# Patient Record
Sex: Female | Born: 1935 | Race: White | Hispanic: No | Marital: Married | Smoking: Never smoker
Health system: Southern US, Community
[De-identification: ages and names within clinical notes are randomized; demographics above are authoritative.]

## PROBLEM LIST (undated history)

## (undated) DIAGNOSIS — Z952 Presence of prosthetic heart valve: Secondary | ICD-10-CM

## (undated) DIAGNOSIS — M216X2 Other acquired deformities of left foot: Secondary | ICD-10-CM

## (undated) DIAGNOSIS — K219 Gastro-esophageal reflux disease without esophagitis: Secondary | ICD-10-CM

## (undated) DIAGNOSIS — E785 Hyperlipidemia, unspecified: Secondary | ICD-10-CM

## (undated) DIAGNOSIS — K8689 Other specified diseases of pancreas: Secondary | ICD-10-CM

## (undated) DIAGNOSIS — M1990 Unspecified osteoarthritis, unspecified site: Secondary | ICD-10-CM

## (undated) DIAGNOSIS — I5033 Acute on chronic diastolic (congestive) heart failure: Secondary | ICD-10-CM

## (undated) DIAGNOSIS — M7752 Other enthesopathy of left foot: Secondary | ICD-10-CM

## (undated) DIAGNOSIS — I10 Essential (primary) hypertension: Secondary | ICD-10-CM

## (undated) DIAGNOSIS — G459 Transient cerebral ischemic attack, unspecified: Secondary | ICD-10-CM

## (undated) DIAGNOSIS — Q210 Ventricular septal defect: Secondary | ICD-10-CM

## (undated) DIAGNOSIS — N183 Chronic kidney disease, stage 3 (moderate): Secondary | ICD-10-CM

## (undated) DIAGNOSIS — I5032 Chronic diastolic (congestive) heart failure: Secondary | ICD-10-CM

## (undated) DIAGNOSIS — I350 Nonrheumatic aortic (valve) stenosis: Secondary | ICD-10-CM

## (undated) HISTORY — PX: TONSILLECTOMY: SUR1361

## (undated) HISTORY — DX: Acute on chronic diastolic (congestive) heart failure: I50.33

## (undated) HISTORY — PX: APPENDECTOMY: SHX54

## (undated) HISTORY — DX: Other enthesopathy of left foot and ankle: M77.52

## (undated) HISTORY — DX: Hyperlipidemia, unspecified: E78.5

## (undated) HISTORY — PX: ABDOMINAL HYSTERECTOMY: SHX81

## (undated) HISTORY — DX: Other specified diseases of pancreas: K86.89

## (undated) HISTORY — DX: Other acquired deformities of left foot: M21.6X2

## (undated) HISTORY — DX: Ventricular septal defect: Q21.0

## (undated) HISTORY — PX: WISDOM TOOTH EXTRACTION: SHX21

## (undated) HISTORY — DX: Chronic kidney disease, stage 3 (moderate): N18.3

## (undated) HISTORY — DX: Essential (primary) hypertension: I10

## (undated) HISTORY — PX: HERNIA REPAIR: SHX51

---

## 2011-10-09 DIAGNOSIS — Z1231 Encounter for screening mammogram for malignant neoplasm of breast: Secondary | ICD-10-CM | POA: Diagnosis not present

## 2011-10-24 DIAGNOSIS — L723 Sebaceous cyst: Secondary | ICD-10-CM | POA: Diagnosis not present

## 2011-10-24 DIAGNOSIS — L570 Actinic keratosis: Secondary | ICD-10-CM | POA: Diagnosis not present

## 2011-10-24 DIAGNOSIS — L821 Other seborrheic keratosis: Secondary | ICD-10-CM | POA: Diagnosis not present

## 2011-11-26 DIAGNOSIS — J069 Acute upper respiratory infection, unspecified: Secondary | ICD-10-CM | POA: Diagnosis not present

## 2011-11-26 DIAGNOSIS — I10 Essential (primary) hypertension: Secondary | ICD-10-CM | POA: Diagnosis not present

## 2011-11-26 DIAGNOSIS — E7800 Pure hypercholesterolemia, unspecified: Secondary | ICD-10-CM | POA: Diagnosis not present

## 2011-12-13 DIAGNOSIS — H524 Presbyopia: Secondary | ICD-10-CM | POA: Diagnosis not present

## 2011-12-13 DIAGNOSIS — H538 Other visual disturbances: Secondary | ICD-10-CM | POA: Diagnosis not present

## 2011-12-27 DIAGNOSIS — I10 Essential (primary) hypertension: Secondary | ICD-10-CM | POA: Diagnosis not present

## 2011-12-27 DIAGNOSIS — E7800 Pure hypercholesterolemia, unspecified: Secondary | ICD-10-CM | POA: Diagnosis not present

## 2012-05-20 DIAGNOSIS — L219 Seborrheic dermatitis, unspecified: Secondary | ICD-10-CM | POA: Diagnosis not present

## 2012-05-20 DIAGNOSIS — L570 Actinic keratosis: Secondary | ICD-10-CM | POA: Diagnosis not present

## 2012-06-16 DIAGNOSIS — H2510 Age-related nuclear cataract, unspecified eye: Secondary | ICD-10-CM | POA: Diagnosis not present

## 2012-06-27 DIAGNOSIS — Z23 Encounter for immunization: Secondary | ICD-10-CM | POA: Diagnosis not present

## 2012-06-30 DIAGNOSIS — R109 Unspecified abdominal pain: Secondary | ICD-10-CM | POA: Diagnosis not present

## 2012-06-30 DIAGNOSIS — M545 Low back pain: Secondary | ICD-10-CM | POA: Diagnosis not present

## 2012-06-30 DIAGNOSIS — Z79899 Other long term (current) drug therapy: Secondary | ICD-10-CM | POA: Diagnosis not present

## 2012-06-30 DIAGNOSIS — E559 Vitamin D deficiency, unspecified: Secondary | ICD-10-CM | POA: Diagnosis not present

## 2012-09-30 DIAGNOSIS — I359 Nonrheumatic aortic valve disorder, unspecified: Secondary | ICD-10-CM | POA: Diagnosis not present

## 2012-09-30 DIAGNOSIS — J00 Acute nasopharyngitis [common cold]: Secondary | ICD-10-CM | POA: Diagnosis not present

## 2012-10-09 DIAGNOSIS — Z1231 Encounter for screening mammogram for malignant neoplasm of breast: Secondary | ICD-10-CM | POA: Diagnosis not present

## 2012-10-15 DIAGNOSIS — R011 Cardiac murmur, unspecified: Secondary | ICD-10-CM | POA: Diagnosis not present

## 2012-10-15 DIAGNOSIS — I359 Nonrheumatic aortic valve disorder, unspecified: Secondary | ICD-10-CM | POA: Diagnosis not present

## 2012-11-26 DIAGNOSIS — E785 Hyperlipidemia, unspecified: Secondary | ICD-10-CM | POA: Diagnosis not present

## 2012-11-29 DIAGNOSIS — L0291 Cutaneous abscess, unspecified: Secondary | ICD-10-CM | POA: Diagnosis not present

## 2012-11-29 DIAGNOSIS — IMO0002 Reserved for concepts with insufficient information to code with codable children: Secondary | ICD-10-CM | POA: Diagnosis not present

## 2013-01-01 DIAGNOSIS — S91009A Unspecified open wound, unspecified ankle, initial encounter: Secondary | ICD-10-CM | POA: Diagnosis not present

## 2013-02-24 DIAGNOSIS — E2839 Other primary ovarian failure: Secondary | ICD-10-CM | POA: Diagnosis not present

## 2013-02-24 DIAGNOSIS — I10 Essential (primary) hypertension: Secondary | ICD-10-CM | POA: Diagnosis not present

## 2013-02-24 DIAGNOSIS — M949 Disorder of cartilage, unspecified: Secondary | ICD-10-CM | POA: Diagnosis not present

## 2013-02-24 DIAGNOSIS — Z780 Asymptomatic menopausal state: Secondary | ICD-10-CM | POA: Diagnosis not present

## 2013-02-24 DIAGNOSIS — Z79899 Other long term (current) drug therapy: Secondary | ICD-10-CM | POA: Diagnosis not present

## 2013-04-29 DIAGNOSIS — L821 Other seborrheic keratosis: Secondary | ICD-10-CM | POA: Diagnosis not present

## 2013-04-29 DIAGNOSIS — L820 Inflamed seborrheic keratosis: Secondary | ICD-10-CM | POA: Diagnosis not present

## 2013-04-29 DIAGNOSIS — L219 Seborrheic dermatitis, unspecified: Secondary | ICD-10-CM | POA: Diagnosis not present

## 2013-05-19 DIAGNOSIS — R0609 Other forms of dyspnea: Secondary | ICD-10-CM | POA: Diagnosis not present

## 2013-05-19 DIAGNOSIS — I359 Nonrheumatic aortic valve disorder, unspecified: Secondary | ICD-10-CM | POA: Diagnosis not present

## 2013-05-19 DIAGNOSIS — I10 Essential (primary) hypertension: Secondary | ICD-10-CM | POA: Diagnosis not present

## 2013-05-19 DIAGNOSIS — E785 Hyperlipidemia, unspecified: Secondary | ICD-10-CM | POA: Diagnosis not present

## 2013-06-11 DIAGNOSIS — Z23 Encounter for immunization: Secondary | ICD-10-CM | POA: Diagnosis not present

## 2013-06-17 DIAGNOSIS — H2510 Age-related nuclear cataract, unspecified eye: Secondary | ICD-10-CM | POA: Diagnosis not present

## 2013-06-26 DIAGNOSIS — W108XXA Fall (on) (from) other stairs and steps, initial encounter: Secondary | ICD-10-CM | POA: Diagnosis not present

## 2013-06-26 DIAGNOSIS — S41109A Unspecified open wound of unspecified upper arm, initial encounter: Secondary | ICD-10-CM | POA: Diagnosis not present

## 2013-06-26 DIAGNOSIS — G319 Degenerative disease of nervous system, unspecified: Secondary | ICD-10-CM | POA: Diagnosis not present

## 2013-06-30 DIAGNOSIS — IMO0002 Reserved for concepts with insufficient information to code with codable children: Secondary | ICD-10-CM | POA: Diagnosis not present

## 2013-06-30 DIAGNOSIS — I10 Essential (primary) hypertension: Secondary | ICD-10-CM | POA: Diagnosis not present

## 2013-06-30 DIAGNOSIS — W010XXA Fall on same level from slipping, tripping and stumbling without subsequent striking against object, initial encounter: Secondary | ICD-10-CM | POA: Diagnosis not present

## 2013-06-30 DIAGNOSIS — S51809A Unspecified open wound of unspecified forearm, initial encounter: Secondary | ICD-10-CM | POA: Diagnosis not present

## 2013-06-30 DIAGNOSIS — M25559 Pain in unspecified hip: Secondary | ICD-10-CM | POA: Diagnosis not present

## 2013-07-14 DIAGNOSIS — Z006 Encounter for examination for normal comparison and control in clinical research program: Secondary | ICD-10-CM | POA: Diagnosis not present

## 2013-07-14 DIAGNOSIS — Z0000 Encounter for general adult medical examination without abnormal findings: Secondary | ICD-10-CM | POA: Diagnosis not present

## 2013-07-14 DIAGNOSIS — S41109A Unspecified open wound of unspecified upper arm, initial encounter: Secondary | ICD-10-CM | POA: Diagnosis not present

## 2013-07-14 DIAGNOSIS — I10 Essential (primary) hypertension: Secondary | ICD-10-CM | POA: Diagnosis not present

## 2013-08-31 DIAGNOSIS — I359 Nonrheumatic aortic valve disorder, unspecified: Secondary | ICD-10-CM | POA: Diagnosis not present

## 2013-08-31 DIAGNOSIS — Z79899 Other long term (current) drug therapy: Secondary | ICD-10-CM | POA: Diagnosis not present

## 2013-08-31 DIAGNOSIS — I10 Essential (primary) hypertension: Secondary | ICD-10-CM | POA: Diagnosis not present

## 2013-08-31 DIAGNOSIS — S0990XA Unspecified injury of head, initial encounter: Secondary | ICD-10-CM | POA: Diagnosis not present

## 2013-08-31 DIAGNOSIS — R42 Dizziness and giddiness: Secondary | ICD-10-CM | POA: Diagnosis not present

## 2013-08-31 DIAGNOSIS — E7800 Pure hypercholesterolemia, unspecified: Secondary | ICD-10-CM | POA: Diagnosis not present

## 2013-08-31 DIAGNOSIS — R0602 Shortness of breath: Secondary | ICD-10-CM | POA: Diagnosis not present

## 2013-08-31 DIAGNOSIS — R5381 Other malaise: Secondary | ICD-10-CM | POA: Diagnosis not present

## 2013-09-01 DIAGNOSIS — I359 Nonrheumatic aortic valve disorder, unspecified: Secondary | ICD-10-CM | POA: Diagnosis not present

## 2013-09-01 DIAGNOSIS — I10 Essential (primary) hypertension: Secondary | ICD-10-CM | POA: Diagnosis not present

## 2013-09-01 DIAGNOSIS — I70209 Unspecified atherosclerosis of native arteries of extremities, unspecified extremity: Secondary | ICD-10-CM | POA: Diagnosis not present

## 2013-09-30 DIAGNOSIS — I10 Essential (primary) hypertension: Secondary | ICD-10-CM | POA: Diagnosis not present

## 2013-10-27 DIAGNOSIS — I359 Nonrheumatic aortic valve disorder, unspecified: Secondary | ICD-10-CM | POA: Diagnosis not present

## 2013-10-27 DIAGNOSIS — I10 Essential (primary) hypertension: Secondary | ICD-10-CM | POA: Diagnosis not present

## 2013-10-29 DIAGNOSIS — Z1231 Encounter for screening mammogram for malignant neoplasm of breast: Secondary | ICD-10-CM | POA: Diagnosis not present

## 2013-11-09 DIAGNOSIS — I10 Essential (primary) hypertension: Secondary | ICD-10-CM | POA: Diagnosis not present

## 2013-11-30 DIAGNOSIS — R0609 Other forms of dyspnea: Secondary | ICD-10-CM | POA: Diagnosis not present

## 2013-11-30 DIAGNOSIS — I10 Essential (primary) hypertension: Secondary | ICD-10-CM | POA: Diagnosis not present

## 2013-11-30 DIAGNOSIS — I359 Nonrheumatic aortic valve disorder, unspecified: Secondary | ICD-10-CM | POA: Diagnosis not present

## 2013-11-30 DIAGNOSIS — R0989 Other specified symptoms and signs involving the circulatory and respiratory systems: Secondary | ICD-10-CM | POA: Diagnosis not present

## 2013-11-30 DIAGNOSIS — R5381 Other malaise: Secondary | ICD-10-CM | POA: Diagnosis not present

## 2013-12-08 DIAGNOSIS — I359 Nonrheumatic aortic valve disorder, unspecified: Secondary | ICD-10-CM | POA: Diagnosis not present

## 2013-12-08 DIAGNOSIS — R011 Cardiac murmur, unspecified: Secondary | ICD-10-CM | POA: Diagnosis not present

## 2013-12-15 DIAGNOSIS — R0609 Other forms of dyspnea: Secondary | ICD-10-CM | POA: Diagnosis not present

## 2013-12-15 DIAGNOSIS — R0989 Other specified symptoms and signs involving the circulatory and respiratory systems: Secondary | ICD-10-CM | POA: Diagnosis not present

## 2013-12-15 DIAGNOSIS — H2589 Other age-related cataract: Secondary | ICD-10-CM | POA: Diagnosis not present

## 2013-12-28 DIAGNOSIS — H6980 Other specified disorders of Eustachian tube, unspecified ear: Secondary | ICD-10-CM | POA: Diagnosis not present

## 2014-01-19 DIAGNOSIS — E559 Vitamin D deficiency, unspecified: Secondary | ICD-10-CM | POA: Diagnosis not present

## 2014-01-19 DIAGNOSIS — I10 Essential (primary) hypertension: Secondary | ICD-10-CM | POA: Diagnosis not present

## 2014-01-19 DIAGNOSIS — E2839 Other primary ovarian failure: Secondary | ICD-10-CM | POA: Diagnosis not present

## 2014-01-21 DIAGNOSIS — I10 Essential (primary) hypertension: Secondary | ICD-10-CM | POA: Diagnosis not present

## 2014-01-22 DIAGNOSIS — I10 Essential (primary) hypertension: Secondary | ICD-10-CM | POA: Diagnosis not present

## 2014-01-22 DIAGNOSIS — I359 Nonrheumatic aortic valve disorder, unspecified: Secondary | ICD-10-CM | POA: Diagnosis not present

## 2014-01-26 DIAGNOSIS — R748 Abnormal levels of other serum enzymes: Secondary | ICD-10-CM | POA: Diagnosis not present

## 2014-02-26 DIAGNOSIS — R748 Abnormal levels of other serum enzymes: Secondary | ICD-10-CM | POA: Diagnosis not present

## 2014-03-03 DIAGNOSIS — I359 Nonrheumatic aortic valve disorder, unspecified: Secondary | ICD-10-CM | POA: Diagnosis not present

## 2014-03-03 DIAGNOSIS — I10 Essential (primary) hypertension: Secondary | ICD-10-CM | POA: Diagnosis not present

## 2014-03-03 DIAGNOSIS — E785 Hyperlipidemia, unspecified: Secondary | ICD-10-CM | POA: Diagnosis not present

## 2014-05-13 DIAGNOSIS — L570 Actinic keratosis: Secondary | ICD-10-CM | POA: Diagnosis not present

## 2014-05-13 DIAGNOSIS — L219 Seborrheic dermatitis, unspecified: Secondary | ICD-10-CM | POA: Diagnosis not present

## 2014-05-13 DIAGNOSIS — L578 Other skin changes due to chronic exposure to nonionizing radiation: Secondary | ICD-10-CM | POA: Diagnosis not present

## 2014-05-13 DIAGNOSIS — L738 Other specified follicular disorders: Secondary | ICD-10-CM | POA: Diagnosis not present

## 2014-05-13 DIAGNOSIS — L821 Other seborrheic keratosis: Secondary | ICD-10-CM | POA: Diagnosis not present

## 2014-05-25 DIAGNOSIS — D518 Other vitamin B12 deficiency anemias: Secondary | ICD-10-CM | POA: Diagnosis not present

## 2014-05-25 DIAGNOSIS — Z23 Encounter for immunization: Secondary | ICD-10-CM | POA: Diagnosis not present

## 2014-05-25 DIAGNOSIS — R5381 Other malaise: Secondary | ICD-10-CM | POA: Diagnosis not present

## 2014-05-25 DIAGNOSIS — R5383 Other fatigue: Secondary | ICD-10-CM | POA: Diagnosis not present

## 2014-06-16 DIAGNOSIS — Z23 Encounter for immunization: Secondary | ICD-10-CM | POA: Diagnosis not present

## 2014-07-06 DIAGNOSIS — I10 Essential (primary) hypertension: Secondary | ICD-10-CM | POA: Diagnosis not present

## 2014-07-06 DIAGNOSIS — R42 Dizziness and giddiness: Secondary | ICD-10-CM | POA: Diagnosis not present

## 2014-07-06 DIAGNOSIS — G609 Hereditary and idiopathic neuropathy, unspecified: Secondary | ICD-10-CM | POA: Diagnosis not present

## 2014-08-09 DIAGNOSIS — E785 Hyperlipidemia, unspecified: Secondary | ICD-10-CM | POA: Diagnosis not present

## 2014-08-09 DIAGNOSIS — R609 Edema, unspecified: Secondary | ICD-10-CM | POA: Diagnosis not present

## 2014-08-09 DIAGNOSIS — I350 Nonrheumatic aortic (valve) stenosis: Secondary | ICD-10-CM | POA: Diagnosis not present

## 2014-08-09 DIAGNOSIS — I10 Essential (primary) hypertension: Secondary | ICD-10-CM | POA: Diagnosis not present

## 2014-08-09 DIAGNOSIS — I70209 Unspecified atherosclerosis of native arteries of extremities, unspecified extremity: Secondary | ICD-10-CM | POA: Diagnosis not present

## 2014-09-02 DIAGNOSIS — Q253 Supravalvular aortic stenosis: Secondary | ICD-10-CM | POA: Diagnosis not present

## 2014-09-02 DIAGNOSIS — R609 Edema, unspecified: Secondary | ICD-10-CM | POA: Diagnosis not present

## 2014-09-02 DIAGNOSIS — I10 Essential (primary) hypertension: Secondary | ICD-10-CM | POA: Diagnosis not present

## 2014-09-02 DIAGNOSIS — E785 Hyperlipidemia, unspecified: Secondary | ICD-10-CM | POA: Diagnosis not present

## 2014-09-02 DIAGNOSIS — R0689 Other abnormalities of breathing: Secondary | ICD-10-CM | POA: Diagnosis not present

## 2014-09-13 DIAGNOSIS — H25813 Combined forms of age-related cataract, bilateral: Secondary | ICD-10-CM | POA: Diagnosis not present

## 2014-10-05 DIAGNOSIS — I10 Essential (primary) hypertension: Secondary | ICD-10-CM | POA: Diagnosis not present

## 2014-10-05 DIAGNOSIS — R358 Other polyuria: Secondary | ICD-10-CM | POA: Diagnosis not present

## 2014-10-11 DIAGNOSIS — Z1231 Encounter for screening mammogram for malignant neoplasm of breast: Secondary | ICD-10-CM | POA: Diagnosis not present

## 2014-10-14 DIAGNOSIS — I350 Nonrheumatic aortic (valve) stenosis: Secondary | ICD-10-CM

## 2014-10-14 DIAGNOSIS — R3589 Other polyuria: Secondary | ICD-10-CM | POA: Insufficient documentation

## 2014-10-14 DIAGNOSIS — Q253 Supravalvular aortic stenosis: Secondary | ICD-10-CM | POA: Diagnosis not present

## 2014-10-14 DIAGNOSIS — Z6821 Body mass index (BMI) 21.0-21.9, adult: Secondary | ICD-10-CM | POA: Diagnosis not present

## 2014-10-14 DIAGNOSIS — R358 Other polyuria: Secondary | ICD-10-CM | POA: Insufficient documentation

## 2014-10-14 DIAGNOSIS — R351 Nocturia: Secondary | ICD-10-CM | POA: Insufficient documentation

## 2014-10-14 HISTORY — DX: Nonrheumatic aortic (valve) stenosis: I35.0

## 2014-10-28 DIAGNOSIS — Z6821 Body mass index (BMI) 21.0-21.9, adult: Secondary | ICD-10-CM | POA: Diagnosis not present

## 2014-10-28 DIAGNOSIS — R358 Other polyuria: Secondary | ICD-10-CM | POA: Diagnosis not present

## 2014-11-05 DIAGNOSIS — I10 Essential (primary) hypertension: Secondary | ICD-10-CM | POA: Diagnosis not present

## 2014-11-05 DIAGNOSIS — R358 Other polyuria: Secondary | ICD-10-CM | POA: Diagnosis not present

## 2014-11-18 DIAGNOSIS — R351 Nocturia: Secondary | ICD-10-CM | POA: Diagnosis not present

## 2014-11-18 DIAGNOSIS — M545 Low back pain, unspecified: Secondary | ICD-10-CM | POA: Insufficient documentation

## 2014-11-18 DIAGNOSIS — Z6821 Body mass index (BMI) 21.0-21.9, adult: Secondary | ICD-10-CM | POA: Diagnosis not present

## 2014-11-18 DIAGNOSIS — R358 Other polyuria: Secondary | ICD-10-CM | POA: Diagnosis not present

## 2015-03-01 DIAGNOSIS — E780 Pure hypercholesterolemia: Secondary | ICD-10-CM | POA: Diagnosis not present

## 2015-03-01 DIAGNOSIS — I10 Essential (primary) hypertension: Secondary | ICD-10-CM | POA: Diagnosis not present

## 2015-03-01 DIAGNOSIS — J40 Bronchitis, not specified as acute or chronic: Secondary | ICD-10-CM | POA: Diagnosis not present

## 2015-03-01 DIAGNOSIS — E559 Vitamin D deficiency, unspecified: Secondary | ICD-10-CM | POA: Diagnosis not present

## 2015-03-01 DIAGNOSIS — M545 Low back pain: Secondary | ICD-10-CM | POA: Diagnosis not present

## 2015-03-01 DIAGNOSIS — M8580 Other specified disorders of bone density and structure, unspecified site: Secondary | ICD-10-CM | POA: Diagnosis not present

## 2015-03-01 DIAGNOSIS — Z79899 Other long term (current) drug therapy: Secondary | ICD-10-CM | POA: Diagnosis not present

## 2015-03-10 DIAGNOSIS — Z0000 Encounter for general adult medical examination without abnormal findings: Secondary | ICD-10-CM | POA: Diagnosis not present

## 2015-03-10 DIAGNOSIS — Z1389 Encounter for screening for other disorder: Secondary | ICD-10-CM | POA: Diagnosis not present

## 2015-03-10 DIAGNOSIS — I10 Essential (primary) hypertension: Secondary | ICD-10-CM | POA: Diagnosis not present

## 2015-05-16 DIAGNOSIS — L821 Other seborrheic keratosis: Secondary | ICD-10-CM | POA: Diagnosis not present

## 2015-05-16 DIAGNOSIS — L570 Actinic keratosis: Secondary | ICD-10-CM | POA: Diagnosis not present

## 2015-05-16 DIAGNOSIS — L578 Other skin changes due to chronic exposure to nonionizing radiation: Secondary | ICD-10-CM | POA: Diagnosis not present

## 2015-06-13 DIAGNOSIS — S81821A Laceration with foreign body, right lower leg, initial encounter: Secondary | ICD-10-CM | POA: Diagnosis not present

## 2015-06-13 DIAGNOSIS — Z23 Encounter for immunization: Secondary | ICD-10-CM | POA: Diagnosis not present

## 2015-06-13 DIAGNOSIS — E780 Pure hypercholesterolemia: Secondary | ICD-10-CM | POA: Diagnosis not present

## 2015-06-13 DIAGNOSIS — I10 Essential (primary) hypertension: Secondary | ICD-10-CM | POA: Diagnosis not present

## 2015-06-13 DIAGNOSIS — S81811A Laceration without foreign body, right lower leg, initial encounter: Secondary | ICD-10-CM | POA: Diagnosis not present

## 2015-06-15 DIAGNOSIS — S81811D Laceration without foreign body, right lower leg, subsequent encounter: Secondary | ICD-10-CM | POA: Diagnosis not present

## 2015-06-21 DIAGNOSIS — M19042 Primary osteoarthritis, left hand: Secondary | ICD-10-CM | POA: Diagnosis not present

## 2015-06-21 DIAGNOSIS — Z48817 Encounter for surgical aftercare following surgery on the skin and subcutaneous tissue: Secondary | ICD-10-CM | POA: Diagnosis not present

## 2015-06-21 DIAGNOSIS — S81801A Unspecified open wound, right lower leg, initial encounter: Secondary | ICD-10-CM | POA: Diagnosis not present

## 2015-06-21 DIAGNOSIS — M167 Other unilateral secondary osteoarthritis of hip: Secondary | ICD-10-CM | POA: Diagnosis not present

## 2015-06-21 DIAGNOSIS — I10 Essential (primary) hypertension: Secondary | ICD-10-CM | POA: Diagnosis not present

## 2015-06-21 DIAGNOSIS — M19041 Primary osteoarthritis, right hand: Secondary | ICD-10-CM | POA: Diagnosis not present

## 2015-06-28 DIAGNOSIS — S81801D Unspecified open wound, right lower leg, subsequent encounter: Secondary | ICD-10-CM | POA: Diagnosis not present

## 2015-06-28 DIAGNOSIS — Z48817 Encounter for surgical aftercare following surgery on the skin and subcutaneous tissue: Secondary | ICD-10-CM | POA: Diagnosis not present

## 2015-07-05 DIAGNOSIS — T8131XA Disruption of external operation (surgical) wound, not elsewhere classified, initial encounter: Secondary | ICD-10-CM | POA: Diagnosis not present

## 2015-07-05 DIAGNOSIS — Z48817 Encounter for surgical aftercare following surgery on the skin and subcutaneous tissue: Secondary | ICD-10-CM | POA: Diagnosis not present

## 2015-07-05 DIAGNOSIS — S81801D Unspecified open wound, right lower leg, subsequent encounter: Secondary | ICD-10-CM | POA: Diagnosis not present

## 2015-07-05 DIAGNOSIS — L97811 Non-pressure chronic ulcer of other part of right lower leg limited to breakdown of skin: Secondary | ICD-10-CM | POA: Diagnosis not present

## 2015-07-12 DIAGNOSIS — S81801D Unspecified open wound, right lower leg, subsequent encounter: Secondary | ICD-10-CM | POA: Diagnosis not present

## 2015-07-12 DIAGNOSIS — Z48817 Encounter for surgical aftercare following surgery on the skin and subcutaneous tissue: Secondary | ICD-10-CM | POA: Diagnosis not present

## 2015-07-19 DIAGNOSIS — Z48817 Encounter for surgical aftercare following surgery on the skin and subcutaneous tissue: Secondary | ICD-10-CM | POA: Diagnosis not present

## 2015-07-19 DIAGNOSIS — S81801D Unspecified open wound, right lower leg, subsequent encounter: Secondary | ICD-10-CM | POA: Diagnosis not present

## 2015-07-19 DIAGNOSIS — Z23 Encounter for immunization: Secondary | ICD-10-CM | POA: Diagnosis not present

## 2015-07-26 DIAGNOSIS — Z48817 Encounter for surgical aftercare following surgery on the skin and subcutaneous tissue: Secondary | ICD-10-CM | POA: Diagnosis not present

## 2015-07-26 DIAGNOSIS — S81801D Unspecified open wound, right lower leg, subsequent encounter: Secondary | ICD-10-CM | POA: Diagnosis not present

## 2015-08-02 DIAGNOSIS — Z48817 Encounter for surgical aftercare following surgery on the skin and subcutaneous tissue: Secondary | ICD-10-CM | POA: Diagnosis not present

## 2015-08-02 DIAGNOSIS — S81801D Unspecified open wound, right lower leg, subsequent encounter: Secondary | ICD-10-CM | POA: Diagnosis not present

## 2015-08-09 DIAGNOSIS — S81801D Unspecified open wound, right lower leg, subsequent encounter: Secondary | ICD-10-CM | POA: Diagnosis not present

## 2015-08-09 DIAGNOSIS — Z48817 Encounter for surgical aftercare following surgery on the skin and subcutaneous tissue: Secondary | ICD-10-CM | POA: Diagnosis not present

## 2015-08-16 DIAGNOSIS — Z48817 Encounter for surgical aftercare following surgery on the skin and subcutaneous tissue: Secondary | ICD-10-CM | POA: Diagnosis not present

## 2015-08-16 DIAGNOSIS — S81801A Unspecified open wound, right lower leg, initial encounter: Secondary | ICD-10-CM | POA: Diagnosis not present

## 2015-08-16 DIAGNOSIS — S81801D Unspecified open wound, right lower leg, subsequent encounter: Secondary | ICD-10-CM | POA: Diagnosis not present

## 2015-08-23 DIAGNOSIS — S81801D Unspecified open wound, right lower leg, subsequent encounter: Secondary | ICD-10-CM | POA: Diagnosis not present

## 2015-08-23 DIAGNOSIS — Z48817 Encounter for surgical aftercare following surgery on the skin and subcutaneous tissue: Secondary | ICD-10-CM | POA: Diagnosis not present

## 2015-08-30 DIAGNOSIS — S81801D Unspecified open wound, right lower leg, subsequent encounter: Secondary | ICD-10-CM | POA: Diagnosis not present

## 2015-08-30 DIAGNOSIS — Z48817 Encounter for surgical aftercare following surgery on the skin and subcutaneous tissue: Secondary | ICD-10-CM | POA: Diagnosis not present

## 2015-09-09 DIAGNOSIS — S81801A Unspecified open wound, right lower leg, initial encounter: Secondary | ICD-10-CM | POA: Diagnosis not present

## 2015-09-09 DIAGNOSIS — S81801D Unspecified open wound, right lower leg, subsequent encounter: Secondary | ICD-10-CM | POA: Diagnosis not present

## 2015-09-09 DIAGNOSIS — Z48817 Encounter for surgical aftercare following surgery on the skin and subcutaneous tissue: Secondary | ICD-10-CM | POA: Diagnosis not present

## 2015-09-13 DIAGNOSIS — S81801A Unspecified open wound, right lower leg, initial encounter: Secondary | ICD-10-CM | POA: Diagnosis not present

## 2015-09-13 DIAGNOSIS — Z48817 Encounter for surgical aftercare following surgery on the skin and subcutaneous tissue: Secondary | ICD-10-CM | POA: Diagnosis not present

## 2015-09-13 DIAGNOSIS — S81801D Unspecified open wound, right lower leg, subsequent encounter: Secondary | ICD-10-CM | POA: Diagnosis not present

## 2015-09-15 DIAGNOSIS — H2513 Age-related nuclear cataract, bilateral: Secondary | ICD-10-CM | POA: Diagnosis not present

## 2015-09-20 DIAGNOSIS — Z48817 Encounter for surgical aftercare following surgery on the skin and subcutaneous tissue: Secondary | ICD-10-CM | POA: Diagnosis not present

## 2015-09-20 DIAGNOSIS — S81801D Unspecified open wound, right lower leg, subsequent encounter: Secondary | ICD-10-CM | POA: Diagnosis not present

## 2015-09-20 DIAGNOSIS — Z87828 Personal history of other (healed) physical injury and trauma: Secondary | ICD-10-CM | POA: Diagnosis not present

## 2015-09-20 DIAGNOSIS — Z09 Encounter for follow-up examination after completed treatment for conditions other than malignant neoplasm: Secondary | ICD-10-CM | POA: Diagnosis not present

## 2015-10-13 DIAGNOSIS — Z1231 Encounter for screening mammogram for malignant neoplasm of breast: Secondary | ICD-10-CM | POA: Diagnosis not present

## 2015-10-20 DIAGNOSIS — I10 Essential (primary) hypertension: Secondary | ICD-10-CM | POA: Diagnosis not present

## 2015-10-20 DIAGNOSIS — R5383 Other fatigue: Secondary | ICD-10-CM | POA: Diagnosis not present

## 2015-10-20 DIAGNOSIS — E2839 Other primary ovarian failure: Secondary | ICD-10-CM | POA: Diagnosis not present

## 2015-10-20 DIAGNOSIS — E559 Vitamin D deficiency, unspecified: Secondary | ICD-10-CM | POA: Diagnosis not present

## 2015-11-02 DIAGNOSIS — I350 Nonrheumatic aortic (valve) stenosis: Secondary | ICD-10-CM | POA: Diagnosis not present

## 2015-11-02 DIAGNOSIS — R899 Unspecified abnormal finding in specimens from other organs, systems and tissues: Secondary | ICD-10-CM | POA: Diagnosis not present

## 2015-11-02 DIAGNOSIS — E782 Mixed hyperlipidemia: Secondary | ICD-10-CM | POA: Diagnosis not present

## 2015-11-02 DIAGNOSIS — I10 Essential (primary) hypertension: Secondary | ICD-10-CM | POA: Diagnosis not present

## 2015-11-02 DIAGNOSIS — N181 Chronic kidney disease, stage 1: Secondary | ICD-10-CM | POA: Diagnosis not present

## 2015-11-21 DIAGNOSIS — E785 Hyperlipidemia, unspecified: Secondary | ICD-10-CM | POA: Diagnosis not present

## 2015-11-21 DIAGNOSIS — I350 Nonrheumatic aortic (valve) stenosis: Secondary | ICD-10-CM | POA: Diagnosis not present

## 2015-11-21 DIAGNOSIS — Z6822 Body mass index (BMI) 22.0-22.9, adult: Secondary | ICD-10-CM | POA: Diagnosis not present

## 2015-11-21 DIAGNOSIS — I10 Essential (primary) hypertension: Secondary | ICD-10-CM | POA: Diagnosis not present

## 2015-11-22 DIAGNOSIS — I10 Essential (primary) hypertension: Secondary | ICD-10-CM | POA: Diagnosis not present

## 2015-11-22 DIAGNOSIS — I350 Nonrheumatic aortic (valve) stenosis: Secondary | ICD-10-CM | POA: Diagnosis not present

## 2015-12-28 DIAGNOSIS — R358 Other polyuria: Secondary | ICD-10-CM | POA: Diagnosis not present

## 2015-12-28 DIAGNOSIS — Z6822 Body mass index (BMI) 22.0-22.9, adult: Secondary | ICD-10-CM | POA: Diagnosis not present

## 2015-12-28 DIAGNOSIS — R351 Nocturia: Secondary | ICD-10-CM | POA: Diagnosis not present

## 2015-12-29 DIAGNOSIS — I781 Nevus, non-neoplastic: Secondary | ICD-10-CM | POA: Diagnosis not present

## 2015-12-29 DIAGNOSIS — L578 Other skin changes due to chronic exposure to nonionizing radiation: Secondary | ICD-10-CM | POA: Diagnosis not present

## 2015-12-29 DIAGNOSIS — L570 Actinic keratosis: Secondary | ICD-10-CM | POA: Diagnosis not present

## 2016-03-05 DIAGNOSIS — H01002 Unspecified blepharitis right lower eyelid: Secondary | ICD-10-CM | POA: Diagnosis not present

## 2016-03-05 DIAGNOSIS — H01005 Unspecified blepharitis left lower eyelid: Secondary | ICD-10-CM | POA: Diagnosis not present

## 2016-05-16 DIAGNOSIS — I10 Essential (primary) hypertension: Secondary | ICD-10-CM | POA: Diagnosis not present

## 2016-05-16 DIAGNOSIS — M1990 Unspecified osteoarthritis, unspecified site: Secondary | ICD-10-CM | POA: Diagnosis not present

## 2016-05-16 DIAGNOSIS — S8012XA Contusion of left lower leg, initial encounter: Secondary | ICD-10-CM | POA: Diagnosis not present

## 2016-05-17 DIAGNOSIS — L578 Other skin changes due to chronic exposure to nonionizing radiation: Secondary | ICD-10-CM | POA: Diagnosis not present

## 2016-05-17 DIAGNOSIS — L821 Other seborrheic keratosis: Secondary | ICD-10-CM | POA: Diagnosis not present

## 2016-05-17 DIAGNOSIS — L570 Actinic keratosis: Secondary | ICD-10-CM | POA: Diagnosis not present

## 2016-05-21 DIAGNOSIS — Z09 Encounter for follow-up examination after completed treatment for conditions other than malignant neoplasm: Secondary | ICD-10-CM | POA: Diagnosis not present

## 2016-05-21 DIAGNOSIS — S8012XD Contusion of left lower leg, subsequent encounter: Secondary | ICD-10-CM | POA: Diagnosis not present

## 2016-05-21 DIAGNOSIS — Z872 Personal history of diseases of the skin and subcutaneous tissue: Secondary | ICD-10-CM | POA: Diagnosis not present

## 2016-05-22 DIAGNOSIS — R0609 Other forms of dyspnea: Secondary | ICD-10-CM | POA: Diagnosis not present

## 2016-05-22 DIAGNOSIS — E785 Hyperlipidemia, unspecified: Secondary | ICD-10-CM | POA: Diagnosis not present

## 2016-05-22 DIAGNOSIS — Z6822 Body mass index (BMI) 22.0-22.9, adult: Secondary | ICD-10-CM | POA: Diagnosis not present

## 2016-05-22 DIAGNOSIS — I10 Essential (primary) hypertension: Secondary | ICD-10-CM | POA: Diagnosis not present

## 2016-05-22 DIAGNOSIS — R42 Dizziness and giddiness: Secondary | ICD-10-CM | POA: Diagnosis not present

## 2016-05-22 DIAGNOSIS — I350 Nonrheumatic aortic (valve) stenosis: Secondary | ICD-10-CM | POA: Diagnosis not present

## 2016-05-29 ENCOUNTER — Other Ambulatory Visit: Payer: Self-pay

## 2016-06-14 DIAGNOSIS — Z0000 Encounter for general adult medical examination without abnormal findings: Secondary | ICD-10-CM | POA: Diagnosis not present

## 2016-06-14 DIAGNOSIS — D519 Vitamin B12 deficiency anemia, unspecified: Secondary | ICD-10-CM | POA: Diagnosis not present

## 2016-06-14 DIAGNOSIS — Z1389 Encounter for screening for other disorder: Secondary | ICD-10-CM | POA: Diagnosis not present

## 2016-06-14 DIAGNOSIS — Z23 Encounter for immunization: Secondary | ICD-10-CM | POA: Diagnosis not present

## 2016-06-14 DIAGNOSIS — Z9181 History of falling: Secondary | ICD-10-CM | POA: Diagnosis not present

## 2016-06-14 DIAGNOSIS — I10 Essential (primary) hypertension: Secondary | ICD-10-CM | POA: Diagnosis not present

## 2016-07-16 DIAGNOSIS — I10 Essential (primary) hypertension: Secondary | ICD-10-CM | POA: Diagnosis not present

## 2016-09-17 DIAGNOSIS — H2513 Age-related nuclear cataract, bilateral: Secondary | ICD-10-CM | POA: Diagnosis not present

## 2016-09-28 DIAGNOSIS — N289 Disorder of kidney and ureter, unspecified: Secondary | ICD-10-CM | POA: Diagnosis not present

## 2016-09-28 DIAGNOSIS — Z1389 Encounter for screening for other disorder: Secondary | ICD-10-CM | POA: Diagnosis not present

## 2016-09-28 DIAGNOSIS — J209 Acute bronchitis, unspecified: Secondary | ICD-10-CM | POA: Diagnosis not present

## 2016-10-01 DIAGNOSIS — R55 Syncope and collapse: Secondary | ICD-10-CM | POA: Diagnosis not present

## 2016-10-01 DIAGNOSIS — S0990XA Unspecified injury of head, initial encounter: Secondary | ICD-10-CM | POA: Diagnosis not present

## 2016-10-01 DIAGNOSIS — S51801A Unspecified open wound of right forearm, initial encounter: Secondary | ICD-10-CM | POA: Diagnosis not present

## 2016-10-01 DIAGNOSIS — J40 Bronchitis, not specified as acute or chronic: Secondary | ICD-10-CM | POA: Diagnosis not present

## 2016-10-04 DIAGNOSIS — S51819A Laceration without foreign body of unspecified forearm, initial encounter: Secondary | ICD-10-CM | POA: Diagnosis not present

## 2016-10-04 DIAGNOSIS — J209 Acute bronchitis, unspecified: Secondary | ICD-10-CM | POA: Diagnosis not present

## 2016-10-04 DIAGNOSIS — N289 Disorder of kidney and ureter, unspecified: Secondary | ICD-10-CM | POA: Diagnosis not present

## 2016-10-04 DIAGNOSIS — I10 Essential (primary) hypertension: Secondary | ICD-10-CM | POA: Diagnosis not present

## 2016-10-08 DIAGNOSIS — I10 Essential (primary) hypertension: Secondary | ICD-10-CM | POA: Diagnosis not present

## 2016-10-08 DIAGNOSIS — S51811A Laceration without foreign body of right forearm, initial encounter: Secondary | ICD-10-CM | POA: Diagnosis not present

## 2016-10-16 DIAGNOSIS — Z7982 Long term (current) use of aspirin: Secondary | ICD-10-CM | POA: Diagnosis not present

## 2016-10-16 DIAGNOSIS — Z79899 Other long term (current) drug therapy: Secondary | ICD-10-CM | POA: Diagnosis not present

## 2016-10-16 DIAGNOSIS — S51811A Laceration without foreign body of right forearm, initial encounter: Secondary | ICD-10-CM | POA: Diagnosis not present

## 2016-10-16 DIAGNOSIS — Z881 Allergy status to other antibiotic agents status: Secondary | ICD-10-CM | POA: Diagnosis not present

## 2016-10-16 DIAGNOSIS — Z1231 Encounter for screening mammogram for malignant neoplasm of breast: Secondary | ICD-10-CM | POA: Diagnosis not present

## 2016-10-22 DIAGNOSIS — S51811D Laceration without foreign body of right forearm, subsequent encounter: Secondary | ICD-10-CM | POA: Diagnosis not present

## 2016-11-23 DIAGNOSIS — I10 Essential (primary) hypertension: Secondary | ICD-10-CM | POA: Diagnosis not present

## 2016-11-25 DIAGNOSIS — I10 Essential (primary) hypertension: Secondary | ICD-10-CM | POA: Insufficient documentation

## 2016-11-25 DIAGNOSIS — E785 Hyperlipidemia, unspecified: Secondary | ICD-10-CM

## 2016-11-25 HISTORY — DX: Hyperlipidemia, unspecified: E78.5

## 2016-11-25 HISTORY — DX: Essential (primary) hypertension: I10

## 2016-11-27 DIAGNOSIS — N183 Chronic kidney disease, stage 3 unspecified: Secondary | ICD-10-CM | POA: Insufficient documentation

## 2016-11-27 DIAGNOSIS — E784 Other hyperlipidemia: Secondary | ICD-10-CM | POA: Diagnosis not present

## 2016-11-27 DIAGNOSIS — Z6823 Body mass index (BMI) 23.0-23.9, adult: Secondary | ICD-10-CM | POA: Diagnosis not present

## 2016-11-27 DIAGNOSIS — I10 Essential (primary) hypertension: Secondary | ICD-10-CM | POA: Diagnosis not present

## 2016-11-27 DIAGNOSIS — I350 Nonrheumatic aortic (valve) stenosis: Secondary | ICD-10-CM | POA: Diagnosis not present

## 2016-11-27 HISTORY — DX: Chronic kidney disease, stage 3 unspecified: N18.30

## 2016-12-10 DIAGNOSIS — I350 Nonrheumatic aortic (valve) stenosis: Secondary | ICD-10-CM | POA: Diagnosis not present

## 2017-01-29 DIAGNOSIS — M216X2 Other acquired deformities of left foot: Secondary | ICD-10-CM | POA: Diagnosis not present

## 2017-01-29 DIAGNOSIS — M7752 Other enthesopathy of left foot: Secondary | ICD-10-CM

## 2017-01-29 DIAGNOSIS — M2042 Other hammer toe(s) (acquired), left foot: Secondary | ICD-10-CM | POA: Diagnosis not present

## 2017-01-29 HISTORY — DX: Other enthesopathy of left foot and ankle: M77.52

## 2017-01-30 DIAGNOSIS — M216X2 Other acquired deformities of left foot: Secondary | ICD-10-CM

## 2017-01-30 HISTORY — DX: Other acquired deformities of left foot: M21.6X2

## 2017-03-14 DIAGNOSIS — R5383 Other fatigue: Secondary | ICD-10-CM | POA: Diagnosis not present

## 2017-03-14 DIAGNOSIS — Z6823 Body mass index (BMI) 23.0-23.9, adult: Secondary | ICD-10-CM | POA: Diagnosis not present

## 2017-03-14 DIAGNOSIS — I10 Essential (primary) hypertension: Secondary | ICD-10-CM | POA: Diagnosis not present

## 2017-03-14 DIAGNOSIS — Z79899 Other long term (current) drug therapy: Secondary | ICD-10-CM | POA: Diagnosis not present

## 2017-03-14 DIAGNOSIS — Z1389 Encounter for screening for other disorder: Secondary | ICD-10-CM | POA: Diagnosis not present

## 2017-03-14 DIAGNOSIS — E785 Hyperlipidemia, unspecified: Secondary | ICD-10-CM | POA: Diagnosis not present

## 2017-03-14 DIAGNOSIS — Z780 Asymptomatic menopausal state: Secondary | ICD-10-CM | POA: Diagnosis not present

## 2017-03-19 DIAGNOSIS — H2513 Age-related nuclear cataract, bilateral: Secondary | ICD-10-CM | POA: Diagnosis not present

## 2017-03-19 DIAGNOSIS — H524 Presbyopia: Secondary | ICD-10-CM | POA: Diagnosis not present

## 2017-04-01 DIAGNOSIS — N289 Disorder of kidney and ureter, unspecified: Secondary | ICD-10-CM | POA: Diagnosis not present

## 2017-05-08 DIAGNOSIS — G8929 Other chronic pain: Secondary | ICD-10-CM | POA: Diagnosis not present

## 2017-05-08 DIAGNOSIS — Z6823 Body mass index (BMI) 23.0-23.9, adult: Secondary | ICD-10-CM | POA: Diagnosis not present

## 2017-05-08 DIAGNOSIS — M25512 Pain in left shoulder: Secondary | ICD-10-CM | POA: Diagnosis not present

## 2017-05-08 DIAGNOSIS — M542 Cervicalgia: Secondary | ICD-10-CM | POA: Diagnosis not present

## 2017-05-14 DIAGNOSIS — J00 Acute nasopharyngitis [common cold]: Secondary | ICD-10-CM | POA: Diagnosis not present

## 2017-05-14 DIAGNOSIS — Z6823 Body mass index (BMI) 23.0-23.9, adult: Secondary | ICD-10-CM | POA: Diagnosis not present

## 2017-05-20 DIAGNOSIS — L578 Other skin changes due to chronic exposure to nonionizing radiation: Secondary | ICD-10-CM | POA: Diagnosis not present

## 2017-05-20 DIAGNOSIS — D0439 Carcinoma in situ of skin of other parts of face: Secondary | ICD-10-CM | POA: Diagnosis not present

## 2017-05-20 DIAGNOSIS — L570 Actinic keratosis: Secondary | ICD-10-CM | POA: Diagnosis not present

## 2017-05-24 DIAGNOSIS — R011 Cardiac murmur, unspecified: Secondary | ICD-10-CM

## 2017-05-24 DIAGNOSIS — I499 Cardiac arrhythmia, unspecified: Secondary | ICD-10-CM | POA: Insufficient documentation

## 2017-05-24 HISTORY — DX: Cardiac murmur, unspecified: R01.1

## 2017-06-11 ENCOUNTER — Encounter: Payer: Self-pay | Admitting: Cardiology

## 2017-06-11 ENCOUNTER — Ambulatory Visit (INDEPENDENT_AMBULATORY_CARE_PROVIDER_SITE_OTHER): Payer: Medicare Other | Admitting: Cardiology

## 2017-06-11 VITALS — BP 132/60 | HR 76 | Ht 60.0 in | Wt 120.1 lb

## 2017-06-11 DIAGNOSIS — E785 Hyperlipidemia, unspecified: Secondary | ICD-10-CM | POA: Diagnosis not present

## 2017-06-11 DIAGNOSIS — I10 Essential (primary) hypertension: Secondary | ICD-10-CM | POA: Diagnosis not present

## 2017-06-11 DIAGNOSIS — I350 Nonrheumatic aortic (valve) stenosis: Secondary | ICD-10-CM

## 2017-06-11 NOTE — Patient Instructions (Signed)
Medication Instructions:  Your physician recommends that you continue on your current medications as directed. Please refer to the Current Medication list given to you today.  Labwork: We are getting your labs from Dr. Janace Aris office.   Testing/Procedures: Your physician has requested that you have an echocardiogram. Echocardiography is a painless test that uses sound waves to create images of your heart. It provides your doctor with information about the size and shape of your heart and how well your heart's chambers and valves are working. This procedure takes approximately one hour. There are no restrictions for this procedure. He would like for you to have this done in March prior to your follow up. Please call our office to schedule if you have not heard anything from our office about appointment date and time.   Follow-Up: Your physician recommends that you schedule a follow-up appointment in: April 2019  Any Other Special Instructions Will Be Listed Below (If Applicable).  Please note that any paperwork needing to be filled out by the provider will need to be addressed at the front desk prior to seeing the provider. Please note that any paperwork FMLA, Disability or other documents regarding health condition is subject to a $25.00 charge that must be received prior to completion of paperwork in the form of a money order or check.    If you need a refill on your cardiac medications before your next appointment, please call your pharmacy.

## 2017-06-11 NOTE — Progress Notes (Signed)
Cardiology Office Note:    Date:  06/11/2017   ID:  Kaitlin Villarreal, DOB 1926/05/23, MRN [id number]  PCP:  Angelina Sheriff, MD  Cardiologist:  Shirlee More, MD    Referring MD: No ref. provider found    ASSESSMENT:    1. Nonrheumatic aortic valve stenosis   2. Essential hypertension   3. Hyperlipidemia, unspecified hyperlipidemia type    PLAN:    In order of problems listed above:  1. Asymptomatic stable for follow-up echocardiogram in March 2019 office visit afterwards. If she develops severe aortic stenosis valve intervention would need to be considered.    2.    Stable blood pressure target continue current treatment with beta blocker and calcium channel blocker.  3.        Stable continue her statin  Next appointment: April 2019   Medication Adjustments/Labs and Tests Ordered: Current medicines are reviewed at length with the patient today.  Concerns regarding medicines are outlined above.  Orders Placed This Encounter  Procedures  . ECHOCARDIOGRAM COMPLETE   No orders of the defined types were placed in this encounter.   Chief Complaint  Patient presents with  . Follow-up    Routine flup appt   . Aortic Stenosis    History of Present Illness:    Kaitlin Villarreal is a 83 y.o. female with a hx of Dyslipidemia, HTN, and moderate aortic stenosis P/M of 53/35 on 11/22/15  last seen 6 months ago. She is pending evaluation for chronic kidney disease and has chronic back and joint pain but is not limited by chest pain shortness of breath palpitation or syncope.I do not have the reports of her echocardiogram in March but she was told it was stable and records requested Compliance with diet, lifestyle and medications: yes Past Medical History:  Diagnosis Date  . Abnormal heart rhythm 05/24/2017  . Acquired hammer toe of left foot 01/29/2017  . Aortic stenosis 10/14/2014   Overview:  Formatting of this note may be different from the original.  moderate by echo 11/2015   . Bone spur  of toe of left foot 01/29/2017  . Essential hypertension 11/25/2016  . Heart murmur 05/24/2017  . Midline low back pain without sciatica 11/18/2014  . Nocturia 10/14/2014  . Other hyperlipidemia 11/25/2016  . 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 Polyuria 10/14/2014  . Stage 3 chronic kidney disease 11/27/2016    Past Surgical History:  Procedure Laterality Date  . ABDOMINAL HYSTERECTOMY    . APPENDECTOMY    . HERNIA REPAIR    . TONSILLECTOMY    . WISDOM TOOTH EXTRACTION      Current Medications: Current Meds  Medication Sig  . amLODipine (NORVASC) 5 MG tablet Take 2.5 mg by mouth daily.  Marland Kitchen aspirin EC 81 MG tablet Take 1 tablet by mouth daily.  . DOCOSAHEXAENOIC ACID PO Take 2 tablets by mouth daily.  Marland Kitchen estradiol (ESTRACE) 0.5 MG tablet Take 0.5 mg by mouth daily.  . fluticasone (FLONASE) 50 MCG/ACT nasal spray Place 1 spray into both nostrils daily as needed for allergies.  Marland Kitchen labetalol (NORMODYNE) 100 MG tablet Take 100 mg by mouth daily.  . Multiple Vitamin (MULTI-VITAMINS) TABS Take 1 tablet by mouth daily.  . pravastatin (PRAVACHOL) 20 MG tablet Take 20 mg by mouth daily.  . Probiotic Product (PROBIOTIC DAILY PO) Take 2 tablets by mouth daily.  . ranitidine (ZANTAC) 300 MG tablet Take 1 tablet by mouth at bedtime.  . Wheat  Dextrin (BENEFIBER DRINK MIX PO) Take 1 packet by mouth daily.     Allergies:   Levofloxacin; Azithromycin; Clarithromycin; and Latex   Social History   Social History  . Marital status: Married    Spouse name: N/A  . Number of children: N/A  . Years of education: N/A   Social History Main Topics  . Smoking status: Never Smoker  . Smokeless tobacco: Never Used  . Alcohol use No  . Drug use: No  . Sexual activity: Not Asked   Other Topics Concern  . None   Social History Narrative  . None     Family History: The patient's family history includes Congenital heart disease in her brother; Heart attack in her brother; Heart disease in  her mother; Uterine cancer in her sister. ROS:   Please see the history of present illness.    All other systems reviewed and are negative.  EKGs/Labs/Other Studies Reviewed:    The following studies were reviewed today:  Recent Labs:requested today from her PCP No results found for requested labs within last 8760 hours.  Recent Lipid Panel No results found for: CHOL, TRIG, HDL, CHOLHDL, VLDL, LDLCALC, LDLDIRECT  Physical Exam:    VS:  BP 132/60 (BP Location: Right Arm, Patient Position: Sitting)   Pulse 76   Ht 5' (1.524 m)   Wt 120 lb 1.9 oz (54.5 kg)   SpO2 95%   BMI 23.46 kg/m     Wt Readings from Last 3 Encounters:  06/11/17 120 lb 1.9 oz (54.5 kg)     GEN:  Well nourished, well developed in no acute distress HEENT: Normal NECK: No JVD; No carotid bruits LYMPHATICS: No lymphadenopathy CARDIAC: S2 is split grade 3/6 harsh grunting ejection murmurin the left lower sternal border maximum in the aortic border radiates to the right shoulder and faintly to the base of the carotid arteries. There is no thrill or aortic regurgitation. RRR, RESPIRATORY:  Clear to auscultation without rales, wheezing or rhonchi  ABDOMEN: Soft, non-tender, non-distended MUSCULOSKELETAL:  No edema; No deformity  SKIN: Warm and dry NEUROLOGIC:  Alert and oriented x 3 PSYCHIATRIC:  Normal affect    Signed, Shirlee More, MD  06/11/2017 2:20 PM    [place]

## 2017-06-12 DIAGNOSIS — N183 Chronic kidney disease, stage 3 (moderate): Secondary | ICD-10-CM | POA: Diagnosis not present

## 2017-06-12 DIAGNOSIS — I350 Nonrheumatic aortic (valve) stenosis: Secondary | ICD-10-CM | POA: Diagnosis not present

## 2017-06-14 ENCOUNTER — Ambulatory Visit: Payer: Self-pay | Admitting: Cardiology

## 2017-06-18 ENCOUNTER — Other Ambulatory Visit: Payer: Self-pay | Admitting: Nephrology

## 2017-06-18 DIAGNOSIS — N183 Chronic kidney disease, stage 3 unspecified: Secondary | ICD-10-CM

## 2017-06-19 DIAGNOSIS — Z0000 Encounter for general adult medical examination without abnormal findings: Secondary | ICD-10-CM | POA: Diagnosis not present

## 2017-06-19 DIAGNOSIS — Z6823 Body mass index (BMI) 23.0-23.9, adult: Secondary | ICD-10-CM | POA: Diagnosis not present

## 2017-06-19 DIAGNOSIS — I10 Essential (primary) hypertension: Secondary | ICD-10-CM | POA: Diagnosis not present

## 2017-06-19 DIAGNOSIS — I350 Nonrheumatic aortic (valve) stenosis: Secondary | ICD-10-CM | POA: Diagnosis not present

## 2017-06-19 DIAGNOSIS — Z23 Encounter for immunization: Secondary | ICD-10-CM | POA: Diagnosis not present

## 2017-06-19 DIAGNOSIS — N189 Chronic kidney disease, unspecified: Secondary | ICD-10-CM | POA: Diagnosis not present

## 2017-06-27 ENCOUNTER — Ambulatory Visit
Admission: RE | Admit: 2017-06-27 | Discharge: 2017-06-27 | Disposition: A | Discharge status: Home / Self Care | Payer: Medicare Other | Source: Ambulatory Visit | Attending: Nephrology | Admitting: Nephrology

## 2017-06-27 DIAGNOSIS — N183 Chronic kidney disease, stage 3 unspecified: Secondary | ICD-10-CM

## 2017-07-02 DIAGNOSIS — N183 Chronic kidney disease, stage 3 (moderate): Secondary | ICD-10-CM | POA: Diagnosis not present

## 2017-07-19 DIAGNOSIS — L97911 Non-pressure chronic ulcer of unspecified part of right lower leg limited to breakdown of skin: Secondary | ICD-10-CM | POA: Diagnosis not present

## 2017-07-24 DIAGNOSIS — L02415 Cutaneous abscess of right lower limb: Secondary | ICD-10-CM | POA: Diagnosis not present

## 2017-07-24 DIAGNOSIS — L97911 Non-pressure chronic ulcer of unspecified part of right lower leg limited to breakdown of skin: Secondary | ICD-10-CM | POA: Diagnosis not present

## 2017-07-24 DIAGNOSIS — L0390 Cellulitis, unspecified: Secondary | ICD-10-CM | POA: Diagnosis not present

## 2017-07-24 DIAGNOSIS — L570 Actinic keratosis: Secondary | ICD-10-CM | POA: Diagnosis not present

## 2017-07-26 DIAGNOSIS — N183 Chronic kidney disease, stage 3 unspecified: Secondary | ICD-10-CM | POA: Insufficient documentation

## 2017-07-26 DIAGNOSIS — N2889 Other specified disorders of kidney and ureter: Secondary | ICD-10-CM

## 2017-07-26 HISTORY — DX: Other specified disorders of kidney and ureter: N28.89

## 2017-07-26 HISTORY — DX: Chronic kidney disease, stage 3 unspecified: N18.30

## 2017-07-29 DIAGNOSIS — L0390 Cellulitis, unspecified: Secondary | ICD-10-CM | POA: Diagnosis not present

## 2017-07-29 DIAGNOSIS — L97911 Non-pressure chronic ulcer of unspecified part of right lower leg limited to breakdown of skin: Secondary | ICD-10-CM | POA: Diagnosis not present

## 2017-08-20 DIAGNOSIS — L570 Actinic keratosis: Secondary | ICD-10-CM | POA: Diagnosis not present

## 2017-08-20 DIAGNOSIS — L97911 Non-pressure chronic ulcer of unspecified part of right lower leg limited to breakdown of skin: Secondary | ICD-10-CM | POA: Diagnosis not present

## 2017-12-10 DIAGNOSIS — Z1231 Encounter for screening mammogram for malignant neoplasm of breast: Secondary | ICD-10-CM | POA: Diagnosis not present

## 2017-12-13 ENCOUNTER — Telehealth: Payer: Self-pay

## 2017-12-13 NOTE — Telephone Encounter (Signed)
Attempted to contact patient. Phone rings, then stops ringing, no one says anything, no voicemail. Left message to return call on daughters voicemail per DPR. Patient needs an echocardiogram appointment scheduled before her follow up appointment on 12/30/17 per last office visit. Echocardiogram order is in system. Patient will need echocardiogram appointment scheduled and then follow-up appointment with Dr. Bettina Gavia scheduled a few days later.

## 2017-12-16 NOTE — Telephone Encounter (Signed)
Patient advised of echocardiogram scheduled at [facility] for 12/23/17 arrive at 1:15 pm. Advised patient she can eat and take her medications as usual, no perfume or powder on her chest. Patient verbalized understanding. No further questions.

## 2017-12-23 DIAGNOSIS — I350 Nonrheumatic aortic (valve) stenosis: Secondary | ICD-10-CM | POA: Diagnosis not present

## 2017-12-29 NOTE — Progress Notes (Signed)
Cardiology Office Note:    Date:  12/30/2017   ID:  Kaitlin Villarreal, DOB [birth year], MRN [id number]  PCP:  Angelina Sheriff, MD  Cardiologist:  Shirlee More, MD    Referring MD: Angelina Sheriff, MD    ASSESSMENT:    1. Nonrheumatic aortic valve stenosis   2. Essential hypertension    PLAN:    In order of problems listed above:  1. Aortic stenosis is become severe by pressure gradients I am unsure if she is symptomatic we will plan to do a stress echo to monitor her exercise tolerance blood pressure response to make a decision of whether surgical or TAVR  stable continue current treatment should be considered   Next appointment: One month   Medication Adjustments/Labs and Tests Ordered: Current medicines are reviewed at length with the patient today.  Concerns regarding medicines are outlined above.  No orders of the defined types were placed in this encounter.  No orders of the defined types were placed in this encounter.   Chief Complaint  Patient presents with  . Follow-up    6 month flup appt-has echo last week at [facility]     History of Present Illness:    Kaitlin Villarreal is a 84 y.o. female with a hx of Dyslipidemia, HTN, and moderate aortic stenosis  last seen 06/11/17.  ASSESSMENT:    06/11/17   1. Nonrheumatic aortic valve stenosis   2. Essential hypertension   3. Hyperlipidemia, unspecified hyperlipidemia type    PLAN:    1.     Asymptomatic stable for follow-up echocardiogram in March 2019 office visit afterwards. If she develops severe aortic stenosis valve intervention would need to be considered.   2.    Stable blood pressure target continue current treatment with beta blocker and calcium channel blocker.  3.       Stable continue her statin  Compliance with diet, lifestyle and medications: Yes On one hand she remains active and works 3 hours in the garden on the other hand complains of fatigue exercise intolerance and diminished overall ability compared to  6 months to a year ago.  Aortic stenosis has progressed is now severe and a stress test is appropriate to define exercise tolerance and blood pressure response.  If abnormal intervention would be appropriate.  She has had no chest pain shortness of breath or syncope. Past Medical History:  Diagnosis Date  . Abnormal heart rhythm 05/24/2017  . Acquired hammer toe of left foot 01/29/2017  . Aortic stenosis 10/14/2014   Overview:  Formatting of this note may be different from the original.  moderate by echo 11/2015   . Bone spur of toe of left foot 01/29/2017  . Essential hypertension 11/25/2016  . Heart murmur 05/24/2017  . Midline low back pain without sciatica 11/18/2014  . Nocturia 10/14/2014  . Other hyperlipidemia 11/25/2016  . 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 Polyuria 10/14/2014  . Stage 3 chronic kidney disease ([place]) 11/27/2016    Past Surgical History:  Procedure Laterality Date  . ABDOMINAL HYSTERECTOMY    . APPENDECTOMY    . HERNIA REPAIR    . TONSILLECTOMY    . WISDOM TOOTH EXTRACTION      Current Medications: Current Meds  Medication Sig  . amLODipine (NORVASC) 5 MG tablet Take 2.5 mg by mouth daily.  Marland Kitchen aspirin EC 81 MG tablet Take 1 tablet by mouth daily.  . DOCOSAHEXAENOIC ACID PO Take 1 tablet by  mouth daily.   Marland Kitchen estradiol (ESTRACE) 0.5 MG tablet Take 0.5 mg by mouth daily.  Marland Kitchen labetalol (NORMODYNE) 100 MG tablet Take 100 mg by mouth daily.  . pravastatin (PRAVACHOL) 20 MG tablet Take 20 mg by mouth daily.  . Probiotic Product (PROBIOTIC DAILY PO) Take 2 tablets by mouth daily.  . ranitidine (ZANTAC) 300 MG tablet Take 1 tablet by mouth at bedtime.     Allergies:   Levofloxacin; Azithromycin; Clarithromycin; and Latex   Social History   Socioeconomic History  . Marital status: Married    Spouse name: Not on file  . Number of children: Not on file  . Years of education: Not on file  . Highest education level: Not on file  Occupational History  . Not on  file  Social Needs  . Financial resource strain: Not on file  . Food insecurity:    Worry: Not on file    Inability: Not on file  . Transportation needs:    Medical: Not on file    Non-medical: Not on file  Tobacco Use  . Smoking status: Never Smoker  . Smokeless tobacco: Never Used  Substance and Sexual Activity  . Alcohol use: No  . Drug use: No  . Sexual activity: Not on file  Lifestyle  . Physical activity:    Days per week: Not on file    Minutes per session: Not on file  . Stress: Not on file  Relationships  . Social connections:    Talks on phone: Not on file    Gets together: Not on file    Attends religious service: Not on file    Active member of club or organization: Not on file    Attends meetings of clubs or organizations: Not on file    Relationship status: Not on file  Other Topics Concern  . Not on file  Social History Narrative  . Not on file     Family History: The patient's family history includes Congenital heart disease in her brother; Heart attack in her brother; Heart disease in her mother; Uterine cancer in her sister. ROS:   Please see the history of present illness.    All other systems reviewed and are negative.  EKGs/Labs/Other Studies Reviewed:    The following studies were reviewed today Echo Jan 04, 2018: Severe AS P/M 69/42 mm hg  AVA 0.87 cm2 Recent Labs: No results found for requested labs within last 8760 hours.  Recent Lipid Panel No results found for: CHOL, TRIG, HDL, CHOLHDL, VLDL, LDLCALC, LDLDIRECT  Physical Exam:    VS:  BP (!) 152/70 (BP Location: Right Arm, Patient Position: Sitting, Cuff Size: Normal)   Pulse 68   Ht 5' (1.524 m)   Wt 120 lb 1.9 oz (54.5 kg)   SpO2 96%   BMI 23.46 kg/m     Wt Readings from Last 3 Encounters:  12/30/17 120 lb 1.9 oz (54.5 kg)  06/11/17 120 lb 1.9 oz (54.5 kg)     GEN:  Well nourished, well developed in no acute distress HEENT: Normal NECK: No JVD; No carotid bruits LYMPHATICS:  No lymphadenopathy CARDIAC: 3/6 AS harsh late peak S2 single RRR,  RESPIRATORY:  Clear to auscultation without rales, wheezing or rhonchi  ABDOMEN: Soft, non-tender, non-distended MUSCULOSKELETAL:  No edema; No deformity  SKIN: Warm and dry NEUROLOGIC:  Alert and oriented x 3 PSYCHIATRIC:  Normal affect    Signed, Shirlee More, MD  12/30/2017 2:19 PM    [facility]  [facility]

## 2017-12-30 ENCOUNTER — Ambulatory Visit (INDEPENDENT_AMBULATORY_CARE_PROVIDER_SITE_OTHER): Payer: Medicare Other | Admitting: Cardiology

## 2017-12-30 ENCOUNTER — Encounter: Payer: Self-pay | Admitting: Cardiology

## 2017-12-30 ENCOUNTER — Ambulatory Visit: Payer: Medicare Other | Admitting: Cardiology

## 2017-12-30 ENCOUNTER — Other Ambulatory Visit: Payer: Self-pay

## 2017-12-30 VITALS — BP 152/70 | HR 68 | Ht 60.0 in | Wt 120.1 lb

## 2017-12-30 DIAGNOSIS — I350 Nonrheumatic aortic (valve) stenosis: Secondary | ICD-10-CM

## 2017-12-30 DIAGNOSIS — I10 Essential (primary) hypertension: Secondary | ICD-10-CM | POA: Diagnosis not present

## 2017-12-30 NOTE — Patient Instructions (Signed)
Medication Instructions:  Your physician recommends that you continue on your current medications as directed. Please refer to the Current Medication list given to you today.  Labwork: None  Testing/Procedures: Your physician has requested that you have a stress echocardiogram. For further information please visit [email or web address]. Please follow instruction sheet as given.  Follow-Up: Your physician recommends that you schedule a follow-up appointment in: 4 weeks.  Any Other Special Instructions Will Be Listed Below (If Applicable).     If you need a refill on your cardiac medications before your next appointment, please call your pharmacy.   

## 2017-12-31 DIAGNOSIS — I350 Nonrheumatic aortic (valve) stenosis: Secondary | ICD-10-CM | POA: Diagnosis not present

## 2017-12-31 DIAGNOSIS — N183 Chronic kidney disease, stage 3 (moderate): Secondary | ICD-10-CM | POA: Diagnosis not present

## 2017-12-31 DIAGNOSIS — N390 Urinary tract infection, site not specified: Secondary | ICD-10-CM | POA: Diagnosis not present

## 2018-01-01 ENCOUNTER — Other Ambulatory Visit: Payer: Self-pay | Admitting: Nephrology

## 2018-01-01 DIAGNOSIS — N390 Urinary tract infection, site not specified: Secondary | ICD-10-CM

## 2018-01-01 DIAGNOSIS — N183 Chronic kidney disease, stage 3 unspecified: Secondary | ICD-10-CM

## 2018-01-01 DIAGNOSIS — I350 Nonrheumatic aortic (valve) stenosis: Secondary | ICD-10-CM

## 2018-01-07 ENCOUNTER — Ambulatory Visit
Admission: RE | Admit: 2018-01-07 | Discharge: 2018-01-07 | Disposition: A | Discharge status: Home / Self Care | Payer: Medicare Other | Source: Ambulatory Visit | Attending: Nephrology | Admitting: Nephrology

## 2018-01-07 DIAGNOSIS — N183 Chronic kidney disease, stage 3 unspecified: Secondary | ICD-10-CM

## 2018-01-07 DIAGNOSIS — N390 Urinary tract infection, site not specified: Secondary | ICD-10-CM

## 2018-01-07 DIAGNOSIS — I350 Nonrheumatic aortic (valve) stenosis: Secondary | ICD-10-CM

## 2018-01-08 ENCOUNTER — Ambulatory Visit (HOSPITAL_BASED_OUTPATIENT_CLINIC_OR_DEPARTMENT_OTHER)
Admission: RE | Admit: 2018-01-08 | Discharge: 2018-01-08 | Disposition: A | Discharge status: Home / Self Care | Payer: Medicare Other | Source: Ambulatory Visit | Attending: Cardiology | Admitting: Cardiology

## 2018-01-08 DIAGNOSIS — I10 Essential (primary) hypertension: Secondary | ICD-10-CM | POA: Insufficient documentation

## 2018-01-08 DIAGNOSIS — I350 Nonrheumatic aortic (valve) stenosis: Secondary | ICD-10-CM | POA: Diagnosis not present

## 2018-01-08 NOTE — Progress Notes (Signed)
  Echocardiogram Echocardiogram Stress Test has been performed.  Kaitlin Villarreal Kaitlin Villarreal Kaitlin Villarreal 01/08/2018, 12:35 PM

## 2018-01-28 NOTE — H&P (View-Only) (Signed)
Cardiology Office Note:    Date:  01/29/2018   ID:  [name], DOB 10-27-1925, MRN [id number]  PCP:  Angelina Sheriff, MD  Cardiologist:  Shirlee More, MD    Referring MD: Angelina Sheriff, MD    ASSESSMENT:    1. Nonrheumatic aortic valve stenosis   2. Essential hypertension   3. Hyperlipidemia, unspecified hyperlipidemia type   4. Preop cardiovascular exam    PLAN:    In order of problems listed above:  1. She has severe symptomatic aortic stenosis will undergo left and right heart catheterization in anticipation of TAVR. 2. Stable continue current treatment calcium channel blocker and beta-blocker 3. Stable continue her statin   Next appointment: 6 weeks   Medication Adjustments/Labs and Tests Ordered: Current medicines are reviewed at length with the patient today.  Concerns regarding medicines are outlined above.  Orders Placed This Encounter  Procedures  . DG Chest 2 View  . Basic metabolic panel  . CBC  . EKG 12-Lead   No orders of the defined types were placed in this encounter.   Chief Complaint  Patient presents with  . Cardiac Valve Problem    History of Present Illness:    Kaitlin Villarreal is a 84 y.o. female with a hx of aortic stenosis last seen 12/30/17.  ASSESSMENT:    12/30/17   1. Nonrheumatic aortic valve stenosis   2. Essential hypertension    PLAN:    1. Aortic stenosis is become severe by pressure gradients I am unsure if she is symptomatic we will plan to do a stress echo to monitor her exercise tolerance blood pressure response to make a decision of whether surgical or TAVR  stable continue current treatment should be considered  Stress echo 01/08/18: Study Conclusions - Aortic valve: Valve area (VTI): 0.7 cm^2. Valve area (Vmax): 0.76   cm^2. Valve area (Vmean): 0.76 cm^2. - Stress: There was a normal resting blood pressure with a   hypotensive response to stress. Functional capacity was decreased @ 3.4 Mets   (greater than  40%). - Stress ECG conclusions: There were no stress arrhythmias or   conduction abnormalities. The stress ECG was non-diagnostic.  Compliance with diet, lifestyle and medications: Yes  After her stress test I asked the patient and her husband to go home and to give consideration to surgical intervention or TAVR for aortic stenosis.  She is still an active woman she is not frail and after a long discussion she decided to proceed.  She acknowledges that her exercise tolerance is diminished but is not having chest pain shortness of breath or syncope.  When she had a stress test performed her exercise tolerance is markedly diminished and she had a hypotensive blood pressure response.  She has no dye allergy or renal insufficiency and she will be scheduled for right and left heart catheterization with Dr. [facility] in anticipation of TAVR. Past Medical History:  Diagnosis Date  . Abnormal heart rhythm 05/24/2017  . Acquired hammer toe of left foot 01/29/2017  . Aortic stenosis 10/14/2014   Overview:  Formatting of this note may be different from the original.  moderate by echo 11/2015   . Bone spur of toe of left foot 01/29/2017  . Essential hypertension 11/25/2016  . Heart murmur 05/24/2017  . Midline low back pain without sciatica 11/18/2014  . Nocturia 10/14/2014  . Other hyperlipidemia 11/25/2016  . 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 Polyuria  10/14/2014  . Stage 3 chronic kidney disease ([place]) 11/27/2016    Past Surgical History:  Procedure Laterality Date  . ABDOMINAL HYSTERECTOMY    . APPENDECTOMY    . HERNIA REPAIR    . TONSILLECTOMY    . WISDOM TOOTH EXTRACTION      Current Medications: Current Meds  Medication Sig  . amLODipine (NORVASC) 5 MG tablet Take 5 mg by mouth daily.   Marland Kitchen aspirin EC 81 MG tablet Take 1 tablet by mouth daily.  . DOCOSAHEXAENOIC ACID PO Take 1 tablet by mouth daily.   Marland Kitchen estradiol (ESTRACE) 1 MG tablet Take 0.5 mg by mouth daily.  Marland Kitchen  labetalol (NORMODYNE) 100 MG tablet Take 100 mg by mouth 2 (two) times daily.   . pravastatin (PRAVACHOL) 20 MG tablet Take 20 mg by mouth daily.  . Probiotic Product (PROBIOTIC DAILY PO) Take 2 tablets by mouth daily.  . ranitidine (ZANTAC) 300 MG tablet Take 1 tablet by mouth at bedtime.  . Wheat Dextrin (BENEFIBER DRINK MIX PO) Take 1 packet by mouth daily.     Allergies:   Levofloxacin; Azithromycin; Clarithromycin; and Latex   Social History   Socioeconomic History  . Marital status: Married    Spouse name: Not on file  . Number of children: Not on file  . Years of education: Not on file  . Highest education level: Not on file  Occupational History  . Not on file  Social Needs  . Financial resource strain: Not on file  . Food insecurity:    Worry: Not on file    Inability: Not on file  . Transportation needs:    Medical: Not on file    Non-medical: Not on file  Tobacco Use  . Smoking status: Never Smoker  . Smokeless tobacco: Never Used  Substance and Sexual Activity  . Alcohol use: No  . Drug use: No  . Sexual activity: Not on file  Lifestyle  . Physical activity:    Days per week: Not on file    Minutes per session: Not on file  . Stress: Not on file  Relationships  . Social connections:    Talks on phone: Not on file    Gets together: Not on file    Attends religious service: Not on file    Active member of club or organization: Not on file    Attends meetings of clubs or organizations: Not on file    Relationship status: Not on file  Other Topics Concern  . Not on file  Social History Narrative  . Not on file     Family History: The patient's family history includes Congenital heart disease in her brother; Heart attack in her brother; Heart disease in her mother; Uterine cancer in her sister. ROS:   Her only other complaint is chronic back pain worsened with physical effort Please see the history of present illness.    All other systems reviewed and  are negative.  EKGs/Labs/Other Studies Reviewed:    The following studies were reviewed today:  EKG:  EKG ordered today.  The ekg ordered today demonstrates sinus rhythm norma  Recent Labs: No results found for requested labs within last 8760 hours.  Recent Lipid Panel No results found for: CHOL, TRIG, HDL, CHOLHDL, VLDL, LDLCALC, LDLDIRECT  Physical Exam:    VS:  BP 140/74 (BP Location: Right Arm, Patient Position: Sitting, Cuff Size: Normal)   Pulse 72   Ht 5' (1.524 m)   Wt 121 lb (  54.9 kg)   SpO2 99%   BMI 23.63 kg/m     Wt Readings from Last 3 Encounters:  01/29/18 121 lb (54.9 kg)  12/30/17 120 lb 1.9 oz (54.5 kg)  06/11/17 120 lb 1.9 oz (54.5 kg)     GEN: Well nourished, well developed in no acute distress HEENT: Normal NECK: No JVD; No carotid bruits LYMPHATICS: No lymphadenopathy CARDIAC: 3/6 AS harsh SEM radiated to carotids S2 normal no AR  RESPIRATORY:  Clear to auscultation without rales, wheezing or rhonchi  ABDOMEN: Soft, non-tender, non-distended MUSCULOSKELETAL:  No edema; No deformity  SKIN: Warm and dry NEUROLOGIC:  Alert and oriented x 3 PSYCHIATRIC:  Normal affect    Signed, Shirlee More, MD  01/29/2018 4:21 PM    [facility]

## 2018-01-28 NOTE — Progress Notes (Signed)
Cardiology Office Note:    Date:  01/29/2018   ID:  Kaitlin Villarreal, DOB 10-18-1925, MRN [id number]  PCP:  Angelina Sheriff, MD  Cardiologist:  Shirlee More, MD    Referring MD: Angelina Sheriff, MD    ASSESSMENT:    1. Nonrheumatic aortic valve stenosis   2. Essential hypertension   3. Hyperlipidemia, unspecified hyperlipidemia type   4. Preop cardiovascular exam    PLAN:    In order of problems listed above:  1. She has severe symptomatic aortic stenosis will undergo left and right heart catheterization in anticipation of TAVR. 2. Stable continue current treatment calcium channel blocker and beta-blocker 3. Stable continue her statin   Next appointment: 6 weeks   Medication Adjustments/Labs and Tests Ordered: Current medicines are reviewed at length with the patient today.  Concerns regarding medicines are outlined above.  Orders Placed This Encounter  Procedures  . DG Chest 2 View  . Basic metabolic panel  . CBC  . EKG 12-Lead   No orders of the defined types were placed in this encounter.   Chief Complaint  Patient presents with  . Cardiac Valve Problem    History of Present Illness:    Kaitlin Villarreal is a 84 y.o. female with a hx of aortic stenosis last seen 12/30/17.  ASSESSMENT:    12/30/17   1. Nonrheumatic aortic valve stenosis   2. Essential hypertension    PLAN:    1. Aortic stenosis is become severe by pressure gradients I am unsure if she is symptomatic we will plan to do a stress echo to monitor her exercise tolerance blood pressure response to make a decision of whether surgical or TAVR  stable continue current treatment should be considered  Stress echo 01/08/18: Study Conclusions - Aortic valve: Valve area (VTI): 0.7 cm^2. Valve area (Vmax): 0.76   cm^2. Valve area (Vmean): 0.76 cm^2. - Stress: There was a normal resting blood pressure with a   hypotensive response to stress. Functional capacity was decreased @ 3.4 Mets   (greater than  40%). - Stress ECG conclusions: There were no stress arrhythmias or   conduction abnormalities. The stress ECG was non-diagnostic.  Compliance with diet, lifestyle and medications: Yes  After her stress test I asked the patient and her husband to go home and to give consideration to surgical intervention or TAVR for aortic stenosis.  She is still an active woman she is not frail and after a long discussion she decided to proceed.  She acknowledges that her exercise tolerance is diminished but is not having chest pain shortness of breath or syncope.  When she had a stress test performed her exercise tolerance is markedly diminished and she had a hypotensive blood pressure response.  She has no dye allergy or renal insufficiency and she will be scheduled for right and left heart catheterization with Dr. Robbie Lis [facility] in anticipation of TAVR. Past Medical History:  Diagnosis Date  . Abnormal heart rhythm 05/24/2017  . Acquired hammer toe of left foot 01/29/2017  . Aortic stenosis 10/14/2014   Overview:  Formatting of this note may be different from the original.  moderate by echo 11/2015   . Bone spur of toe of left foot 01/29/2017  . Essential hypertension 11/25/2016  . Heart murmur 05/24/2017  . Midline low back pain without sciatica 11/18/2014  . Nocturia 10/14/2014  . Other hyperlipidemia 11/25/2016  . 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 Polyuria  10/14/2014  . Stage 3 chronic kidney disease ([place]) 11/27/2016    Past Surgical History:  Procedure Laterality Date  . ABDOMINAL HYSTERECTOMY    . APPENDECTOMY    . HERNIA REPAIR    . TONSILLECTOMY    . WISDOM TOOTH EXTRACTION      Current Medications: Current Meds  Medication Sig  . amLODipine (NORVASC) 5 MG tablet Take 5 mg by mouth daily.   Marland Kitchen aspirin EC 81 MG tablet Take 1 tablet by mouth daily.  . DOCOSAHEXAENOIC ACID PO Take 1 tablet by mouth daily.   Marland Kitchen estradiol (ESTRACE) 1 MG tablet Take 0.5 mg by mouth daily.  Marland Kitchen  labetalol (NORMODYNE) 100 MG tablet Take 100 mg by mouth 2 (two) times daily.   . pravastatin (PRAVACHOL) 20 MG tablet Take 20 mg by mouth daily.  . Probiotic Product (PROBIOTIC DAILY PO) Take 2 tablets by mouth daily.  . ranitidine (ZANTAC) 300 MG tablet Take 1 tablet by mouth at bedtime.  . Wheat Dextrin (BENEFIBER DRINK MIX PO) Take 1 packet by mouth daily.     Allergies:   Levofloxacin; Azithromycin; Clarithromycin; and Latex   Social History   Socioeconomic History  . Marital status: Married    Spouse name: Not on file  . Number of children: Not on file  . Years of education: Not on file  . Highest education level: Not on file  Occupational History  . Not on file  Social Needs  . Financial resource strain: Not on file  . Food insecurity:    Worry: Not on file    Inability: Not on file  . Transportation needs:    Medical: Not on file    Non-medical: Not on file  Tobacco Use  . Smoking status: Never Smoker  . Smokeless tobacco: Never Used  Substance and Sexual Activity  . Alcohol use: No  . Drug use: No  . Sexual activity: Not on file  Lifestyle  . Physical activity:    Days per week: Not on file    Minutes per session: Not on file  . Stress: Not on file  Relationships  . Social connections:    Talks on phone: Not on file    Gets together: Not on file    Attends religious service: Not on file    Active member of club or organization: Not on file    Attends meetings of clubs or organizations: Not on file    Relationship status: Not on file  Other Topics Concern  . Not on file  Social History Narrative  . Not on file     Family History: The patient's family history includes Congenital heart disease in her brother; Heart attack in her brother; Heart disease in her mother; Uterine cancer in her sister. ROS:   Her only other complaint is chronic back pain worsened with physical effort Please see the history of present illness.    All other systems reviewed and  are negative.  EKGs/Labs/Other Studies Reviewed:    The following studies were reviewed today:  EKG:  EKG ordered today.  The ekg ordered today demonstrates sinus rhythm norma  Recent Labs: No results found for requested labs within last 8760 hours.  Recent Lipid Panel No results found for: CHOL, TRIG, HDL, CHOLHDL, VLDL, LDLCALC, LDLDIRECT  Physical Exam:    VS:  BP 140/74 (BP Location: Right Arm, Patient Position: Sitting, Cuff Size: Normal)   Pulse 72   Ht 5' (1.524 m)   Wt 121 lb (  54.9 kg)   SpO2 99%   BMI 23.63 kg/m     Wt Readings from Last 3 Encounters:  01/29/18 121 lb (54.9 kg)  12/30/17 120 lb 1.9 oz (54.5 kg)  06/11/17 120 lb 1.9 oz (54.5 kg)     GEN: Well nourished, well developed in no acute distress HEENT: Normal NECK: No JVD; No carotid bruits LYMPHATICS: No lymphadenopathy CARDIAC: 3/6 AS harsh SEM radiated to carotids S2 normal no AR  RESPIRATORY:  Clear to auscultation without rales, wheezing or rhonchi  ABDOMEN: Soft, non-tender, non-distended MUSCULOSKELETAL:  No edema; No deformity  SKIN: Warm and dry NEUROLOGIC:  Alert and oriented x 3 PSYCHIATRIC:  Normal affect    Signed, Shirlee More, MD  01/29/2018 4:21 PM     [facility]

## 2018-01-29 ENCOUNTER — Ambulatory Visit (INDEPENDENT_AMBULATORY_CARE_PROVIDER_SITE_OTHER): Payer: Medicare Other | Admitting: Cardiology

## 2018-01-29 ENCOUNTER — Ambulatory Visit (HOSPITAL_BASED_OUTPATIENT_CLINIC_OR_DEPARTMENT_OTHER)
Admission: RE | Admit: 2018-01-29 | Discharge: 2018-01-29 | Disposition: A | Discharge status: Home / Self Care | Payer: Medicare Other | Source: Ambulatory Visit | Attending: Cardiology | Admitting: Cardiology

## 2018-01-29 ENCOUNTER — Encounter: Payer: Self-pay | Admitting: Cardiology

## 2018-01-29 VITALS — BP 140/74 | HR 72 | Ht 60.0 in | Wt 121.0 lb

## 2018-01-29 DIAGNOSIS — Z01810 Encounter for preprocedural cardiovascular examination: Secondary | ICD-10-CM | POA: Diagnosis not present

## 2018-01-29 DIAGNOSIS — E785 Hyperlipidemia, unspecified: Secondary | ICD-10-CM

## 2018-01-29 DIAGNOSIS — I10 Essential (primary) hypertension: Secondary | ICD-10-CM | POA: Diagnosis not present

## 2018-01-29 DIAGNOSIS — I350 Nonrheumatic aortic (valve) stenosis: Secondary | ICD-10-CM | POA: Diagnosis not present

## 2018-01-29 DIAGNOSIS — Z01818 Encounter for other preprocedural examination: Secondary | ICD-10-CM | POA: Diagnosis not present

## 2018-01-29 NOTE — Patient Instructions (Addendum)
Medication Instructions:  Your physician recommends that you continue on your current medications as directed. Please refer to the Current Medication list given to you today.  Labwork: Your physician recommends that you have the following labs drawn: BMP, CBC  Testing/Procedures: You had an EKG today.  A chest x-ray takes a picture of the organs and structures inside the chest, including the heart, lungs, and blood vessels. This test can show several things, including, whether the heart is enlarges; whether fluid is building up in the lungs; and whether pacemaker / defibrillator leads are still in place.  Your physician has requested that you have a cardiac catheterization. Cardiac catheterization is used to diagnose and/or treat various heart conditions. Doctors may recommend this procedure for a number of different reasons. The most common reason is to evaluate chest pain. Chest pain can be a symptom of coronary artery disease (CAD), and cardiac catheterization can show whether plaque is narrowing or blocking your heart's arteries. This procedure is also used to evaluate the valves, as well as measure the blood flow and oxygen levels in different parts of your heart. For further information please visit [employer]. Please follow instruction sheet, as given.    [facility] [address] [place] [address] Dept: [phone] Loc: [phone]  Kaitlin Villarreal  01/29/2018  You are scheduled for a Cardiac Catheterization on Thursday, May 9 with Dr. Sherren Mocha.  1. Please arrive at the [facility] (Main Entrance A) at [facility]: [address] [place], [place] [address] at 8:00 AM (two hours before your procedure to ensure your preparation). Free valet parking service is available.   Special note: Every effort is made to have your procedure done on time. Please understand that  emergencies sometimes delay scheduled procedures.  2. Diet: Do not eat or drink anything after midnight prior to your procedure except sips of water to take medications.  3. Labs: None needed. We are checking today.  4. Medication instructions in preparation for your procedure:  On the morning of your procedure, take your Aspirin and any morning medicines NOT listed above.  You may use sips of water.  5. Plan for one night stay--bring personal belongings. 6. Bring a current list of your medications and current insurance cards. 7. You MUST have a responsible person to drive you home. 8. Someone MUST be with you the first 24 hours after you arrive home or your discharge will be delayed. 9. Please wear clothes that are easy to get on and off and wear slip-on shoes.  Thank you for allowing [place] to care for you!   -- [facility]  Follow-Up: Your physician recommends that you schedule a follow-up appointment in: 6 weeks.  Any Other Special Instructions Will Be Listed Below (If Applicable).     If you need a refill on your cardiac medications before your next appointment, please call your pharmacy.    Transcatheter Aortic Valve Replacement Transcatheter aortic valve replacement (TAVR) is a procedure to place an artificial aortic valve in the heart. The aortic valve controls blood flow between the heart and the main blood vessel that supplies blood to the body (aorta). During TAVR, a flexible tube (catheter) is placed through an incision in the groin, chest, or neck and is guided to the aortic valve. Then, the artificial valve is moved through the catheter and into the aortic valve, where it is expanded. The  new valve takes over the function of the old valve, and it improves blood flow through the heart. This procedure is also called transcatheter aortic valve implantation (TAVI). You may need TAVR if you have a stiff aortic valve (aortic stenosis) that is  causing symptoms and open heart valve replacement surgery is not an option for you. Tell a health care provider about:  Any allergies you have.  All medicines you are taking, including vitamins, herbs, eye drops, creams, and over-the-counter medicines.  Any problems you or family members have had with anesthetic medicines.  Any blood disorders you have.  Any surgeries you have had.  Any medical conditions you have.  Whether you are pregnant or may be pregnant. What are the risks? Generally, this is a safe procedure. However, problems may occur, including:  Bleeding.  Damage to blood vessels or the heart.  Stroke.  Blood clots.  Abnormal heart rhythms (arrhythmia).  Heart attack.  Allergic reactions to medicines or dyes.  Infection.  Kidney failure.  Failure of the artificial valve.  What happens before the procedure? Medicines  Ask your health care provider about: ? Changing or stopping your regular medicines. This is especially important if you are taking diabetes medicines or blood thinners. ? Taking medicines such as aspirin and ibuprofen. These medicines can thin your blood. Do not take these medicines before your procedure if your health care provider instructs you not to.  You may be given antibiotic medicine to help prevent infection.  You may need to take blood thinners before surgery. If so, take these medicines as directed. Staying hydrated Follow instructions from your health care provider about hydration, which may include:  Up to 2 hours before the procedure - you may continue to drink clear liquids, such as water, clear fruit juice, black coffee, and plain tea.  Eating and drinking restrictions Follow instructions from your health care provider about eating and drinking, which may include:  8 hours before the procedure - stop eating heavy meals or foods such as meat, fried foods, or fatty foods.  6 hours before the procedure - stop eating light  meals or foods, such as toast or cereal.  6 hours before the procedure - stop drinking milk or drinks that contain milk.  2 hours before the procedure - stop drinking clear liquids.  General instructions  Do not use any products that contain nicotine or tobacco for as long as possible before your procedure. These include cigarettes and e-cigarettes. If you need help quitting, ask your health care provider.  You may have a blood or urine sample taken.  You may have tests, such as: ? Echocardiogram. This is an ultrasound scan of the heart. ? CT scan. ? Cardiac catheterization. During this procedure, a catheter is inserted into a blood vessel and guided into your heart to check pressures and take images of the heart. ? Lung function tests.  Plan to have someone take you home from the hospital. What happens during the procedure?  To lower your risk of infection: ? Your health care team will wash or sanitize their hands. ? Your skin will be washed with soap.  IV tubes will be inserted into veins in your arms.  You will be given one or more of the following: ? A medicine to help you relax (sedative). ? A medicine to numb the area (local anesthetic). ? A medicine to make you fall asleep (general anesthetic).  Small incisions will be made in one or more of the  following places: ? Your groin. This is the most common. ? Your neck. ? In your chest, over your breastbone (sternum). ? The left side of your chest.  Catheters will be threaded into your incisions and into blood vessels that lead to your heart. If your incision is in the left side of your chest, a catheter will be placed directly into your heart. Catheters will be used to monitor your heartbeat and regulate it if necessary (pacemaker).  Ongoing X-ray images (fluoroscopy) will be used to guide the catheters to the right places in your heart.  A wire may be placed through the catheter inside your aortic valve. A balloon at the  end of this catheter may be inflated to open your aortic valve. This wire will then be removed.  Another wire will be placed through the catheter inside your aortic valve. The wire will be used to help position and inflate your artificial valve. Then, the wire will be removed.  Tests will be done to make sure your new valve is working and your heart is functioning properly.  Your incisions will be closed with stitches (sutures), skin glue, or adhesive strips.  If you had an incision in your chest wall, you may have a chest tube placed to keep your lung from collapsing.  Bandages (dressings) will be placed over your incisions. The procedure may vary among health care providers and hospitals. What happens after the procedure?  Your blood pressure, heart rate, breathing rate, and blood oxygen level will be monitored.  You may have to wear compression stockings. These stockings help to prevent blood clots and reduce swelling in your legs.  You may need to lie still in bed for several hours. Once you are allowed to get up, you will be encouraged to move around.  If you had a chest incision, you will have some chest pain. You will be given pain medicine as needed.  You may continue to receive fluids and medicines through an IV tube.  You will be given blood thinners.  You will be shown how to do breathing exercises to prevent lung infection.  You may continue to have a chest tube draining fluid from the surgical area.  Do not drive for 24 hours if you were given a sedative. Summary  Transcatheter aortic valve replacement (TAVR) is a procedure to place an artificial aortic valve in the heart.  You may need TAVR if you have a stiff aortic valve (aortic stenosis) that is causing symptoms and open heart valve replacement surgery is not an option for you.  After the procedure, you may need to lie still in bed for several hours. Once you are allowed to get up, you will be encouraged to move  around. This information is not intended to replace advice given to you by your health care provider. Make sure you discuss any questions you have with your health care provider. Document Released: 08/06/2016 Document Revised: 08/06/2016 Document Reviewed: 08/06/2016 Elsevier Interactive Patient Education  2018 [place]. Aortic Valve Stenosis Aortic valve stenosis is a narrowing of the aortic valve. The aortic valve opens and closes to regulate blood flow between the lower left chamber of the heart (left ventricle) and the blood vessel that leads away from the heart (aorta). When the aortic valve becomes narrow, it makes it difficult for the heart to pump blood into the aorta, which causes the heart to work harder. The extra work can weaken the heart over time. Aortic valve stenosis can range  from mild to severe. If untreated, it can become more severe over time and can lead to heart failure. What are the causes? This condition may be caused by:  Buildup of calcium around and on the valve. This can occur with aging. This is the most common cause of aortic valve stenosis.  Birth defect.  Rheumatic fever.  Radiation to the chest.  What increases the risk? You may be more likely to develop this condition if:  You are over the age of 27.  You were born with an abnormal bicuspid valve.  What are the signs or symptoms? You may have no symptoms until your condition becomes severe. It may take 10-20 years for mild or moderate aortic valve stenosis to become severe. Symptoms may include:  Shortness of breath. This may get worse during physical activity.  Feeling unusually weak and tired (fatigue).  Extreme discomfort in the chest, neck, or arm (angina).  A heartbeat that is irregular or faster than normal (palpitations).  Dizziness or fainting. This may happen when you get physically tired or after you take certain heart medicines, such as nitroglycerin.  How is this  diagnosed? This condition may be diagnosed with:  A physical exam.  Echocardiogram. This is a type of imaging test that uses sound waves (ultrasound) to make an image of your heart. There are two types that may be used: ? Transthoracic echocardiogram (TTE). This type of echocardiogram is noninvasive, and it is usually done first. ? Transesophageal echocardiogram (TEE). This type of echocardiogram is done by passing a flexible tube down your esophagus. The heart and the esophagus are close to each other, so your health care provider can take very clear, detailed pictures of the heart using this type of test.  Cardiac catheterization. In this procedure, a thin, flexible tube (catheter) is passed through a large vein in your neck, groin, or arm. This procedure provides information about arteries, structures, blood pressure, and oxygen levels in your heart.  Electrocardiogram (ECG). This records the electrical impulses of your heart and assesses heart function.  Stress tests. These are tests that evaluate the blood supply to your heart and your heart's response to exercise.  Blood tests.  You may work with a health care provider who specializes in the heart (cardiologist). How is this treated? Treatment depends on how severe your condition is and what your symptoms are. You will need to have your heart checked regularly to make sure that your condition is not getting worse or causing serious problems. If your condition is mild, no treatment may be needed. Treatment may include:  Medicines that help keep your heart rate regular.  Medicines that thin your blood (anticoagulants) to prevent the formation of blood clots.  Antibiotic medicines to help prevent infection.  Surgery to replace your aortic valve. This is the most common treatment for aortic valve stenosis. Several types of surgeries are available. The surgery may be done through a large incision over your heart (open heart surgery), or  it may be done using a minimally invasive technique (transcatheter aortic valve replacement, or TAVR).  Follow these instructions at home: Lifestyle   Limit alcohol intake to no more than 1 drink per day for nonpregnant women and 2 drinks per day for men. One drink equals 12 oz of beer, 5 oz of wine, or 1 oz of hard liquor.  Do not use any tobacco products, such as cigarettes, chewing tobacco, or e-cigarettes. If you need help quitting, ask your health care provider.  Work with your health care provider to manage your blood pressure and cholesterol.  Maintain a healthy weight. Eating and drinking  Follow instructions from your health care provider about eating or drinking restrictions. ? Limit how much caffeine you drink. Caffeine can affect your heart's rate and rhythm.  Drink enough fluid to keep your urine clear or pale yellow.  Eat a heart-healthy diet. This should include plenty of fresh fruits and vegetables. If you eat meat, it should be lean cuts. Avoid foods that are: ? High in salt, saturated fat, or sugar. ? Canned or highly processed. ? Fried. Activity  Return to your normal activities as told by your health care provider. Ask your health care provider what activities are safe for you.  Exercise regularly, as told by your health care provider. Ask your health care provider what types of exercise are safe for you.  If your aortic valve stenosis is mild, you may need to avoid only very intense physical activity. The more severe your aortic valve stenosis is, the more activities you may need to avoid. General instructions  Take over-the-counter and prescription medicines only as told by your health care provider.  If you are a woman and you plan to become pregnant, talk with your health care provider before you become pregnant.  Tell all health care providers who care for you that you have aortic valve stenosis.  Keep all follow-up visits as told by your health care  provider. This is important. Contact a health care provider if:  You have a fever. Get help right away if:  You develop chest pain or tightness.  You develop shortness of breath or difficulty breathing.  You feel light-headed.  You feel like you might faint.  Your heartbeat is irregular or faster than normal. These symptoms may represent a serious problem that is an emergency. Do not wait to see if the symptoms will go away. Get medical help right away. Call your local emergency services (911 in the [place] Do not drive yourself to the hospital. This information is not intended to replace advice given to you by your health care provider. Make sure you discuss any questions you have with your health care provider. Document Released: 06/16/2003 Document Revised: 02/23/2016 Document Reviewed: 08/21/2015 Elsevier Interactive Patient Education  2017 [employer].

## 2018-01-30 LAB — CBC
Hematocrit: 34.7 % (ref 34.0–46.6)
Hemoglobin: 11.2 g/dL (ref 11.1–15.9)
MCH: 29.3 pg (ref 26.6–33.0)
MCHC: 32.3 g/dL (ref 31.5–35.7)
MCV: 91 fL (ref 79–97)
PLATELETS: 301 10*3/uL (ref 150–379)
RBC: 3.82 x10E6/uL (ref 3.77–5.28)
RDW: 13.5 % (ref 12.3–15.4)
WBC: 5.9 10*3/uL (ref 3.4–10.8)

## 2018-01-30 LAB — BASIC METABOLIC PANEL
BUN / CREAT RATIO: 22 (ref 12–28)
BUN: 24 mg/dL (ref 8–27)
CHLORIDE: 104 mmol/L (ref 96–106)
CO2: 25 mmol/L (ref 20–29)
Calcium: 9.4 mg/dL (ref 8.7–10.3)
Creatinine, Ser: 1.07 mg/dL — ABNORMAL HIGH (ref 0.57–1.00)
GFR calc Af Amer: 53 mL/min/{1.73_m2} — ABNORMAL LOW (ref >59)
GFR calc non Af Amer: 46 mL/min/{1.73_m2} — ABNORMAL LOW (ref >59)
Glucose: 99 mg/dL (ref 65–99)
Potassium: 4.9 mmol/L (ref 3.5–5.2)
SODIUM: 142 mmol/L (ref 134–144)

## 2018-02-04 ENCOUNTER — Telehealth: Payer: Self-pay | Admitting: *Deleted

## 2018-02-04 NOTE — Telephone Encounter (Signed)
LMTCB to discuss instructions 

## 2018-02-04 NOTE — Telephone Encounter (Addendum)
Pt contacted pre-catheterization scheduled at [facility] for: Thursday Feb 06, 2018 10:30 AM Verified arrival time and place: [place] Entrance A at: 8 AM  No solid food after midnight prior to cath, clear liquids until 5 AM day of procedure. Verify allergies in Epic Verify no diabetes medications.  AM meds can be  taken pre-cath with sip of water including: ASA 81 mg  Confirm patient has responsible person to drive home post procedure and observe patient for 24 hours:yes

## 2018-02-06 ENCOUNTER — Ambulatory Visit (HOSPITAL_COMMUNITY)
Admission: RE | Admit: 2018-02-06 | Discharge: 2018-02-06 | Disposition: A | Discharge status: Home / Self Care | Payer: Medicare Other | Source: Ambulatory Visit | Attending: Cardiovascular Disease | Admitting: Cardiovascular Disease

## 2018-02-06 ENCOUNTER — Other Ambulatory Visit: Payer: Self-pay

## 2018-02-06 ENCOUNTER — Ambulatory Visit (HOSPITAL_COMMUNITY)
Admission: RE | Disposition: A | Discharge status: Home / Self Care | Payer: Self-pay | Source: Ambulatory Visit | Attending: Cardiovascular Disease

## 2018-02-06 DIAGNOSIS — I10 Essential (primary) hypertension: Secondary | ICD-10-CM

## 2018-02-06 DIAGNOSIS — Z91040 Latex allergy status: Secondary | ICD-10-CM | POA: Insufficient documentation

## 2018-02-06 DIAGNOSIS — I350 Nonrheumatic aortic (valve) stenosis: Secondary | ICD-10-CM | POA: Insufficient documentation

## 2018-02-06 DIAGNOSIS — E785 Hyperlipidemia, unspecified: Secondary | ICD-10-CM

## 2018-02-06 DIAGNOSIS — Z8249 Family history of ischemic heart disease and other diseases of the circulatory system: Secondary | ICD-10-CM | POA: Diagnosis not present

## 2018-02-06 DIAGNOSIS — N183 Chronic kidney disease, stage 3 (moderate): Secondary | ICD-10-CM | POA: Diagnosis not present

## 2018-02-06 DIAGNOSIS — I129 Hypertensive chronic kidney disease with stage 1 through stage 4 chronic kidney disease, or unspecified chronic kidney disease: Secondary | ICD-10-CM | POA: Insufficient documentation

## 2018-02-06 DIAGNOSIS — I2584 Coronary atherosclerosis due to calcified coronary lesion: Secondary | ICD-10-CM | POA: Diagnosis not present

## 2018-02-06 DIAGNOSIS — Z01810 Encounter for preprocedural cardiovascular examination: Secondary | ICD-10-CM

## 2018-02-06 DIAGNOSIS — I2510 Atherosclerotic heart disease of native coronary artery without angina pectoris: Secondary | ICD-10-CM | POA: Diagnosis not present

## 2018-02-06 DIAGNOSIS — Z7982 Long term (current) use of aspirin: Secondary | ICD-10-CM | POA: Diagnosis not present

## 2018-02-06 DIAGNOSIS — E7849 Other hyperlipidemia: Secondary | ICD-10-CM | POA: Insufficient documentation

## 2018-02-06 HISTORY — PX: RIGHT/LEFT HEART CATH AND CORONARY ANGIOGRAPHY: CATH118266

## 2018-02-06 LAB — POCT I-STAT 3, ART BLOOD GAS (G3+)
Acid-base deficit: 1 mmol/L (ref 0.0–2.0)
Acid-base deficit: 1 mmol/L (ref 0.0–2.0)
BICARBONATE: 24.5 mmol/L (ref 20.0–28.0)
Bicarbonate: 24.8 mmol/L (ref 20.0–28.0)
O2 Saturation: 73 %
O2 Saturation: 94 %
PCO2 ART: 45.4 mmHg (ref 32.0–48.0)
PCO2 ART: 44.1 mmHg (ref 32.0–48.0)
PH ART: 7.346 — AB (ref 7.350–7.450)
PO2 ART: 41 mmHg — AB (ref 83.0–108.0)
PO2 ART: 74 mmHg — AB (ref 83.0–108.0)
TCO2: 26 mmol/L (ref 22–32)
TCO2: 26 mmol/L (ref 22–32)
pH, Arterial: 7.352 (ref 7.350–7.450)

## 2018-02-06 SURGERY — RIGHT/LEFT HEART CATH AND CORONARY ANGIOGRAPHY
Anesthesia: LOCAL

## 2018-02-06 MED ORDER — ONDANSETRON HCL 4 MG/2ML IJ SOLN: 4.0000 mg | Freq: Four times a day (QID) | INTRAMUSCULAR | Status: DC | PRN

## 2018-02-06 MED ORDER — SODIUM CHLORIDE 0.9 % IV SOLN: 250.0000 mL | INTRAVENOUS | Status: DC | PRN

## 2018-02-06 MED ORDER — SODIUM CHLORIDE 0.9% FLUSH: 3.0000 mL | INTRAVENOUS | Status: DC | PRN

## 2018-02-06 MED ORDER — MIDAZOLAM HCL 2 MG/2ML IJ SOLN
INTRAMUSCULAR | Status: AC
  Filled 2018-02-06: qty 2

## 2018-02-06 MED ORDER — ASPIRIN 81 MG PO CHEW: 81.0000 mg | CHEWABLE_TABLET | ORAL | Status: DC

## 2018-02-06 MED ORDER — VERAPAMIL HCL 2.5 MG/ML IV SOLN
INTRAVENOUS | Status: AC
  Filled 2018-02-06: qty 2

## 2018-02-06 MED ORDER — ACETAMINOPHEN 325 MG PO TABS: 650.0000 mg | ORAL_TABLET | ORAL | Status: DC | PRN

## 2018-02-06 MED ORDER — HEPARIN (PORCINE) IN NACL 2-0.9 UNITS/ML
INTRAMUSCULAR | Status: AC | PRN
  Administered 2018-02-06 (×2): 500 mL

## 2018-02-06 MED ORDER — SODIUM CHLORIDE 0.9% FLUSH: 3.0000 mL | Freq: Two times a day (BID) | INTRAVENOUS | Status: DC

## 2018-02-06 MED ORDER — HEPARIN SODIUM (PORCINE) 1000 UNIT/ML IJ SOLN
INTRAMUSCULAR | Status: DC | PRN
  Administered 2018-02-06: 3000 [IU] via INTRAVENOUS

## 2018-02-06 MED ORDER — VERAPAMIL HCL 2.5 MG/ML IV SOLN
INTRAVENOUS | Status: DC | PRN
  Administered 2018-02-06: 10:00:00 via INTRA_ARTERIAL

## 2018-02-06 MED ORDER — IOHEXOL 350 MG/ML SOLN
INTRAVENOUS | Status: DC | PRN
  Administered 2018-02-06: 55 mL via INTRACARDIAC

## 2018-02-06 MED ORDER — HEPARIN (PORCINE) IN NACL 1000-0.9 UT/500ML-% IV SOLN
INTRAVENOUS | Status: AC
  Filled 2018-02-06: qty 500

## 2018-02-06 MED ORDER — LIDOCAINE HCL (PF) 1 % IJ SOLN
INTRAMUSCULAR | Status: AC
  Filled 2018-02-06: qty 30

## 2018-02-06 MED ORDER — LIDOCAINE HCL (PF) 1 % IJ SOLN
INTRAMUSCULAR | Status: DC | PRN
  Administered 2018-02-06 (×2): 2 mL

## 2018-02-06 MED ORDER — SODIUM CHLORIDE 0.9 % WEIGHT BASED INFUSION: 1.0000 mL/kg/h | INTRAVENOUS | Status: DC

## 2018-02-06 MED ORDER — MIDAZOLAM HCL 2 MG/2ML IJ SOLN
INTRAMUSCULAR | Status: DC | PRN
  Administered 2018-02-06: 1 mg via INTRAVENOUS

## 2018-02-06 MED ORDER — SODIUM CHLORIDE 0.9 % WEIGHT BASED INFUSION
3.0000 mL/kg/h | INTRAVENOUS | Status: AC
  Administered 2018-02-06: 3 mL/kg/h via INTRAVENOUS

## 2018-02-06 MED ORDER — SODIUM CHLORIDE 0.9 % WEIGHT BASED INFUSION: 3.0000 mL/kg/h | INTRAVENOUS | Status: DC

## 2018-02-06 MED ORDER — SODIUM CHLORIDE 0.9 % WEIGHT BASED INFUSION
1.0000 mL/kg/h | INTRAVENOUS | Status: DC
  Administered 2018-02-06: 1 mL/kg/h via INTRAVENOUS

## 2018-02-06 SURGICAL SUPPLY — 13 items
CATH BALLN WEDGE 5F 110CM (CATHETERS) ×2 IMPLANT
CATH INFINITI 5 FR 3DRC (CATHETERS) ×2 IMPLANT
CATH INFINITI 5 FR JL3.5 (CATHETERS) ×2 IMPLANT
CATH INFINITI JR4 5F (CATHETERS) ×2 IMPLANT
DEVICE RAD COMP TR BAND LRG (VASCULAR PRODUCTS) ×2 IMPLANT
GUIDEWIRE INQWIRE 1.5J.035X260 (WIRE) ×1 IMPLANT
INQWIRE 1.5J .035X260CM (WIRE) ×2
KIT HEART LEFT (KITS) ×2 IMPLANT
PACK CARDIAC CATHETERIZATION (CUSTOM PROCEDURE TRAY) ×2 IMPLANT
SHEATH GLIDE SLENDER 4/5FR (SHEATH) ×2 IMPLANT
SHEATH RAIN RADIAL 21G 6FR (SHEATH) ×2 IMPLANT
TRANSDUCER W/STOPCOCK (MISCELLANEOUS) ×2 IMPLANT
TUBING CIL FLEX 10 FLL-RA (TUBING) ×2 IMPLANT

## 2018-02-06 NOTE — Discharge Instructions (Signed)

## 2018-02-06 NOTE — Interval H&P Note (Signed)
History and Physical Interval Note:  02/06/2018 9:27 AM  Kaitlin Villarreal  has presented today for surgery, with the diagnosis of as  The various methods of treatment have been discussed with the patient and family. After consideration of risks, benefits and other options for treatment, the patient has consented to  Procedure(s): RIGHT/LEFT HEART CATH AND CORONARY ANGIOGRAPHY (N/A) as a surgical intervention .  The patient's history has been reviewed, patient examined, no change in status, stable for surgery.  I have reviewed the patient's chart and labs.  Questions were answered to the patient's satisfaction.     Sherren Mocha

## 2018-02-07 ENCOUNTER — Encounter (HOSPITAL_COMMUNITY): Payer: Self-pay | Admitting: Cardiovascular Disease

## 2018-02-11 MED FILL — Heparin Sod (Porcine)-NaCl IV Soln 1000 Unit/500ML-0.9%: INTRAVENOUS | Qty: 1000 | Status: AC

## 2018-02-18 DIAGNOSIS — L821 Other seborrheic keratosis: Secondary | ICD-10-CM | POA: Diagnosis not present

## 2018-02-18 DIAGNOSIS — L728 Other follicular cysts of the skin and subcutaneous tissue: Secondary | ICD-10-CM | POA: Diagnosis not present

## 2018-02-18 DIAGNOSIS — L578 Other skin changes due to chronic exposure to nonionizing radiation: Secondary | ICD-10-CM | POA: Diagnosis not present

## 2018-02-20 ENCOUNTER — Encounter: Payer: Self-pay | Admitting: Physical Therapy

## 2018-02-20 ENCOUNTER — Ambulatory Visit (HOSPITAL_COMMUNITY)
Admit: 2018-02-20 | Discharge: 2018-02-20 | Disposition: A | Discharge status: Home / Self Care | Payer: Medicare Other | Attending: Cardiovascular Disease | Admitting: Cardiovascular Disease

## 2018-02-20 ENCOUNTER — Other Ambulatory Visit: Payer: Self-pay

## 2018-02-20 ENCOUNTER — Ambulatory Visit (HOSPITAL_BASED_OUTPATIENT_CLINIC_OR_DEPARTMENT_OTHER)
Admit: 2018-02-20 | Discharge: 2018-02-20 | Disposition: A | Discharge status: Home / Self Care | Payer: Medicare Other | Attending: Cardiovascular Disease | Admitting: Cardiovascular Disease

## 2018-02-20 ENCOUNTER — Ambulatory Visit: Payer: Medicare Other | Attending: Cardiovascular Disease | Admitting: Physical Therapy

## 2018-02-20 DIAGNOSIS — I700 Atherosclerosis of aorta: Secondary | ICD-10-CM | POA: Diagnosis not present

## 2018-02-20 DIAGNOSIS — K862 Cyst of pancreas: Secondary | ICD-10-CM | POA: Diagnosis not present

## 2018-02-20 DIAGNOSIS — R2689 Other abnormalities of gait and mobility: Secondary | ICD-10-CM | POA: Insufficient documentation

## 2018-02-20 DIAGNOSIS — I350 Nonrheumatic aortic (valve) stenosis: Secondary | ICD-10-CM

## 2018-02-20 DIAGNOSIS — I2510 Atherosclerotic heart disease of native coronary artery without angina pectoris: Secondary | ICD-10-CM | POA: Diagnosis not present

## 2018-02-20 DIAGNOSIS — R293 Abnormal posture: Secondary | ICD-10-CM | POA: Insufficient documentation

## 2018-02-20 DIAGNOSIS — I6523 Occlusion and stenosis of bilateral carotid arteries: Secondary | ICD-10-CM | POA: Diagnosis not present

## 2018-02-20 DIAGNOSIS — N2889 Other specified disorders of kidney and ureter: Secondary | ICD-10-CM | POA: Diagnosis not present

## 2018-02-20 DIAGNOSIS — J449 Chronic obstructive pulmonary disease, unspecified: Secondary | ICD-10-CM | POA: Insufficient documentation

## 2018-02-20 LAB — PULMONARY FUNCTION TEST
DL/VA % pred: 132 %
DL/VA: 5.62 ml/min/mmHg/L
DLCO UNC % PRED: 25 %
DLCO UNC: 4.84 ml/min/mmHg
FEF 25-75 PRE: 1.07 L/s
FEF 25-75 Post: 1.68 L/sec
FEF2575-%Change-Post: 57 %
FEF2575-%Pred-Post: 250 %
FEF2575-%Pred-Pre: 159 %
FEV1-%CHANGE-POST: 13 %
FEV1-%PRED-POST: 102 %
FEV1-%Pred-Pre: 90 %
FEV1-POST: 1.26 L
FEV1-Pre: 1.11 L
FEV1FVC-%Change-Post: -1 %
FEV1FVC-%Pred-Pre: 111 %
FEV6-%Change-Post: 14 %
FEV6-%PRED-POST: 100 %
FEV6-%PRED-PRE: 87 %
FEV6-POST: 1.56 L
FEV6-Pre: 1.36 L
FEV6FVC-%PRED-POST: 108 %
FEV6FVC-%Pred-Pre: 108 %
FVC-%Change-Post: 14 %
FVC-%Pred-Post: 93 %
FVC-%Pred-Pre: 81 %
FVC-Post: 1.58 L
FVC-Pre: 1.38 L
POST FEV6/FVC RATIO: 100 %
PRE FEV1/FVC RATIO: 80 %
PRE FEV6/FVC RATIO: 100 %
Post FEV1/FVC ratio: 79 %
RV % PRED: 162 %
RV: 3.87 L
TLC % PRED: 117 %
TLC: 5.23 L

## 2018-02-20 MED ORDER — IOPAMIDOL (ISOVUE-370) INJECTION 76%
INTRAVENOUS | Status: AC
  Filled 2018-02-20: qty 100

## 2018-02-20 MED ORDER — ALBUTEROL SULFATE (2.5 MG/3ML) 0.083% IN NEBU
2.5000 mg | INHALATION_SOLUTION | Freq: Once | RESPIRATORY_TRACT | Status: AC
  Administered 2018-02-20: 2.5 mg via RESPIRATORY_TRACT

## 2018-02-20 MED ORDER — IOPAMIDOL (ISOVUE-370) INJECTION 76%
100.0000 mL | Freq: Once | INTRAVENOUS | Status: AC | PRN
  Administered 2018-02-20: 100 mL via INTRAVENOUS

## 2018-02-20 MED ORDER — IOPAMIDOL (ISOVUE-370) INJECTION 76%
80.0000 mL | Freq: Once | INTRAVENOUS | Status: AC | PRN
  Administered 2018-02-20: 80 mL via INTRAVENOUS

## 2018-02-20 NOTE — Therapy (Signed)
[place], [place], [address] Phone: [phone]   Fax:  [phone]  Physical Therapy Evaluation  Patient Details  Name: Kaitlin Villarreal MRN: [id number] Date of Birth: 02-04-1926 Referring Provider: Dr. Sherren Mocha   Encounter Date: 02/20/2018  PT End of Session - 02/20/18 0904    Visit Number  1    PT Start Time  3785    PT Stop Time  0942    PT Time Calculation (min)  37 min       Past Medical History:  Diagnosis Date  . Abnormal heart rhythm 05/24/2017  . Acquired hammer toe of left foot 01/29/2017  . Aortic stenosis 10/14/2014   Overview:  Formatting of this note may be different from the original.  moderate by echo 11/2015   . Bone spur of toe of left foot 01/29/2017  . Essential hypertension 11/25/2016  . Heart murmur 05/24/2017  . Midline low back pain without sciatica 11/18/2014  . Nocturia 10/14/2014  . Other hyperlipidemia 11/25/2016  . 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 Polyuria 10/14/2014  . Stage 3 chronic kidney disease ([place]) 11/27/2016    Past Surgical History:  Procedure Laterality Date  . ABDOMINAL HYSTERECTOMY    . APPENDECTOMY    . HERNIA REPAIR    . RIGHT/LEFT HEART CATH AND CORONARY ANGIOGRAPHY N/A 02/06/2018   Procedure: RIGHT/LEFT HEART CATH AND CORONARY ANGIOGRAPHY;  Surgeon: Sherren Mocha, MD;  Location: [facility];  Service: Cardiovascular;  Laterality: N/A;  . TONSILLECTOMY    . WISDOM TOOTH EXTRACTION      There were no vitals filed for this visit.   Subjective Assessment - 02/20/18 0911    Subjective  Pt reports a gradual decrease to exercise tolerance/walking and requiring more rest. Walking fast makes her short of breath. Reports decline over the last 6 months.     Patient Stated Goals  to fix heart and stay active    Currently in Pain?  No/denies         [facility] PT Assessment - 02/20/18 0001      Assessment   Medical Diagnosis  severe aortic  stenosis    Referring Provider  Dr. Sherren Mocha    Onset Date/Surgical Date  -- approximately 6 months ago      Precautions   Precautions  None      Restrictions   Weight Bearing Restrictions  No      Balance Screen   Has the patient fallen in the past 6 months  No    Has the patient had a decrease in activity level because of a fear of falling?   No    Is the patient reluctant to leave their home because of a fear of falling?   No      Home Environment   Living Environment  Private residence    Living Arrangements  Spouse/significant other    Home Access  Stairs to enter    Entrance Stairs-Number of Steps  5    Entrance Stairs-Rails  Right;Left;Cannot reach both    Home Layout  Two level [employer] - uses regularly      Prior Function   Level of Independence  Independent with household mobility without device      Posture/Postural Control   Posture/Postural Control  Postural limitations    Postural Limitations  Forward head;Rounded Shoulders moderate      ROM / Strength   AROM / PROM /  Strength  AROM;Strength      AROM   Overall AROM Comments  grossly WNL      Strength   Overall Strength Comments  grossly 5/5 throughout    Strength Assessment Site  Hand    Right/Left hand  Right;Left    Right Hand Grip (lbs)  22 R hand dominant    Left Hand Grip (lbs)  11      Ambulation/Gait   Gait Comments  Pt ambulates with narrow BOS and occasional crossover. Gait distance limited by 41% for age/gender,       [facility] Pre-Surgical Assessment - 02/20/18 0001    5 Meter Walk Test- trial 1  6 sec    5 Meter Walk Test- trial 2  6 sec.     5 Meter Walk Test- trial 3  6 sec.    5 meter walk test average  6 sec    4 Stage Balance Test tolerated for:   10 sec.    4 Stage Balance Test Position  2    comment  coud not acheive tandem without A but then able to hold for up to 10 seconds    Sit To Stand Test- trial 1  23 sec.    ADL/IADL Independent with:  Bathing;Dressing;Meal  prep;Finances;Yard work    6 Minute Walk- Baseline  yes    BP (mmHg)  154/62    HR (bpm)  66    02 Sat (%RA)  95 %    Modified Borg Scale for Dyspnea  0- Nothing at all    Perceived Rate of Exertion (Borg)  6-    6 Minute Walk Post Test  yes    BP (mmHg)  160/70    HR (bpm)  94    02 Sat (%RA)  99 %    Modified Borg Scale for Dyspnea  2- Mild shortness of breath    Perceived Rate of Exertion (Borg)  12-    Aerobic Endurance Distance Walked  752              Objective measurements completed on examination: See above findings.              PT Education - 02/20/18 0945    Education provided  Yes    Education Details  tandem stance at sink for balance    Person(s) Educated  Patient    Methods  Explanation;Demonstration    Comprehension  Verbalized understanding                  Plan - 02/20/18 0945    Clinical Impression Statement  see below    PT Frequency  One time visit      Clinical Impression Statement: Pt is an 84 yo female presenting to OP PT for evaluation prior to possible TAVR surgery due to severe aortic stenosis. Pt reports onset of shortness of breath with fast walking and a decline in activity tolerance approximately 6 months ago. Pt presents with good ROM and strength, poor balance and is at high fall risk 4 stage balance test, good walking speed and poor aerobic endurance per 6 minute walk test. Pt ambulated 752 feet in 4:30 before requesting a seated rest beak lasting the remainder of test. Needed rest due to back pain and shortness of breath rated 2/10 on modified scale for dyspnea. At time of rest, patient's HR was 94 bpm and O2 was 99 on room air. Based on the Short Physical Performance Battery, patient has a  frailty rating of 7/12 with </= 5/12 considered frail.   Patient demonstrated the following deficits and impairments:     Visit Diagnosis: Other abnormalities of gait and mobility  Abnormal posture     Problem  List Patient Active Problem List   Diagnosis Date Noted  . Abnormal heart rhythm 05/24/2017  . Heart murmur 05/24/2017  . 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 Acquired hammer toe of left foot 01/29/2017  . Bone spur of toe of left foot 01/29/2017  . Stage 3 chronic kidney disease ([place]) 11/27/2016  . Essential hypertension 11/25/2016  . Hyperlipidemia 11/25/2016  . Midline low back pain without sciatica 11/18/2014  . Aortic stenosis 10/14/2014  . Nocturia 10/14/2014  . Polyuria 10/14/2014    Oather Muilenburg, PT 02/20/2018, 9:46 AM  [facility] [address] [place], [place], [address] Phone: [phone]   Fax:  [phone]  Name: Kaitlin Villarreal MRN: [id number] Date of Birth: [birth year]

## 2018-02-20 NOTE — Progress Notes (Signed)
VASCULAR LAB PRELIMINARY  PRELIMINARY  PRELIMINARY  PRELIMINARY  Carotid duplex completed.    Preliminary report:  1-39% ICA stenosis.  Vertebral artery flow is antegrade. ICAs are tortuous, bilaterally.   Emmagrace Runkel, RVT 02/20/2018, 1:14 PM

## 2018-02-25 ENCOUNTER — Encounter: Payer: Medicare Other | Admitting: Surgery

## 2018-02-27 ENCOUNTER — Encounter: Payer: Medicare Other | Admitting: Thoracic Surgery (Cardiothoracic Vascular Surgery)

## 2018-02-28 ENCOUNTER — Encounter: Payer: Self-pay | Admitting: Thoracic Surgery (Cardiothoracic Vascular Surgery)

## 2018-02-28 ENCOUNTER — Institutional Professional Consult (permissible substitution) (INDEPENDENT_AMBULATORY_CARE_PROVIDER_SITE_OTHER): Payer: Medicare Other | Admitting: Thoracic Surgery (Cardiothoracic Vascular Surgery)

## 2018-02-28 ENCOUNTER — Other Ambulatory Visit: Payer: Self-pay

## 2018-02-28 VITALS — BP 138/73 | HR 69 | Resp 18 | Ht 60.0 in | Wt 115.0 lb

## 2018-02-28 DIAGNOSIS — I350 Nonrheumatic aortic (valve) stenosis: Secondary | ICD-10-CM | POA: Diagnosis not present

## 2018-02-28 DIAGNOSIS — K8689 Other specified diseases of pancreas: Secondary | ICD-10-CM | POA: Insufficient documentation

## 2018-02-28 NOTE — Progress Notes (Signed)
[facility] CONSULTATION REPORT  Referring Provider is Kaitlin Villarreal, Hilton Cork, MD PCP is Kaitlin Sheriff, MD  Chief Complaint  Patient presents with  . Aortic Stenosis    1st TAVR evaluation    HPI:  Patient is an 84 year old female with history of aortic stenosis, hypertension, chronic back pain, and stage III chronic kidney disease who has been referred for surgical consultation to discuss treatment options for management of severe symptomatic aortic stenosis.  The patient states that she was first noted to have a heart murmur on routine physical examination by her gynecologist nearly 20 years ago.  She was eventually referred for cardiology evaluation and has been followed for several years by Dr. Bettina Villarreal.  She has remained reasonably active physically despite her advanced age, although she has slowed down considerably over the last few years.  She has developed exertional shortness of breath and fatigue, some of which the patient has blamed on decreased activity because of problems with her back.  She underwent a routine transthoracic echocardiogram on December 23, 2017 at [facility].  By report this echocardiogram revealed severe aortic stenosis with normal left ventricular systolic function.  Ejection fraction was estimated greater than 70%.  Peak velocity across aortic valve was reported 4.1 m/s with peak and mean transvalvular gradients estimated 69 and 42 mmHg respectively.  Aortic valve area was calculated 0.87 cm.  Exercise stress echocardiogram was performed January 08, 2018.  This confirmed the presence of severe aortic stenosis associated with hypertensive response to stress.  Peak velocity across aortic valve at rest was measured 4.3 m/s corresponding to mean transvalvular gradient estimated 40 mmHg.  The DVI was 0.24 with aortic valve area calculated 0.76 cm.  The patient was referred to Dr. Burt Knack and  underwent diagnostic cardiac catheterization Feb 06, 2018.  She was found to have moderate nonobstructive coronary artery disease with normal right heart hemodynamics and preserved cardiac output.  CT angiography was performed and the patient was referred for surgical consultation.  The patient is married with her husband of 90 years and lives in [place].  She has been retired for more than 25 years.  She has remained reasonably active throughout retirement and remains functionally independent.  She admits to progressive exertional fatigue and generalized weakness.  She states that she is limited by chronic back pain.  She has developed exertional shortness of breath which occurs with moderate and sometimes low level activity.  She denies any resting shortness of breath, PND, orthopnea, palpitations, or syncope.  She reports occasional mild dizzy spells.  She has occasionally slight amount of lower extremity edema.  She denies any exertional chest pain or chest tightness.  Past Medical History:  Diagnosis Date  . Abnormal heart rhythm   . Acquired hammer toe of left foot   . Aortic stenosis   . Bone spur of toe of left foot   . Essential hypertension   . Heart murmur   . Midline low back pain without sciatica   . Nocturia   . Other hyperlipidemia   . Pancreatic mass    benign appearing but needs f/u  . 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 Polyuria   . Stage 3 chronic kidney disease [facility])     Past Surgical History:  Procedure Laterality Date  . ABDOMINAL HYSTERECTOMY    . APPENDECTOMY    . HERNIA REPAIR    . RIGHT/LEFT HEART  CATH AND CORONARY ANGIOGRAPHY N/A 02/06/2018   Procedure: RIGHT/LEFT HEART CATH AND CORONARY ANGIOGRAPHY;  Surgeon: Kaitlin Mocha, MD;  Location: [facility];  Service: Cardiovascular;  Laterality: N/A;  . TONSILLECTOMY    . WISDOM TOOTH EXTRACTION      Family History  Problem Relation Age of Onset  . Heart disease Mother   . Uterine cancer Sister     . Congenital heart disease Brother   . Heart attack Brother     Social History   Socioeconomic History  . Marital status: Married    Spouse name: Not on file  . Number of children: Not on file  . Years of education: Not on file  . Highest education level: Not on file  Occupational History  . Not on file  Social Needs  . Financial resource strain: Not on file  . Food insecurity:    Worry: Not on file    Inability: Not on file  . Transportation needs:    Medical: Not on file    Non-medical: Not on file  Tobacco Use  . Smoking status: Never Smoker  . Smokeless tobacco: Never Used  Substance and Sexual Activity  . Alcohol use: No  . Drug use: No  . Sexual activity: Not on file  Lifestyle  . Physical activity:    Days per week: Not on file    Minutes per session: Not on file  . Stress: Not on file  Relationships  . Social connections:    Talks on phone: Not on file    Gets together: Not on file    Attends religious service: Not on file    Active member of club or organization: Not on file    Attends meetings of clubs or organizations: Not on file    Relationship status: Not on file  . Intimate partner violence:    Fear of current or ex partner: Not on file    Emotionally abused: Not on file    Physically abused: Not on file    Forced sexual activity: Not on file  Other Topics Concern  . Not on file  Social History Narrative  . Not on file    Current Outpatient Medications  Medication Sig Dispense Refill  . acetaminophen (TYLENOL) 650 MG CR tablet Take 650-1,300 mg by mouth every 8 (eight) hours as needed for pain.    Marland Kitchen amLODipine (NORVASC) 2.5 MG tablet Take 2.5 mg by mouth daily.     Marland Kitchen aspirin EC 81 MG tablet Take 81 mg by mouth daily.     Marland Kitchen estradiol (ESTRACE) 1 MG tablet Take 0.5 mg by mouth daily.    Marland Kitchen labetalol (NORMODYNE) 100 MG tablet Take 100 mg by mouth 2 (two) times daily.     . Omega-3 Fatty Acids (FISH OIL) 1000 MG CAPS Take 1,000 mg by mouth  daily.    Vladimir Faster Glycol-Propyl Glycol (SYSTANE OP) Place 1 drop into both eyes daily as needed (for dry eyes).    . pravastatin (PRAVACHOL) 20 MG tablet Take 20 mg by mouth at bedtime.     . Probiotic Product ([employer] PROBIOTIC) CHEW Chew 2 each by mouth daily.    . ranitidine (ZANTAC) 300 MG tablet Take 300 mg by mouth at bedtime.     . Wheat Dextrin (BENEFIBER DRINK MIX PO) Take 1 packet by mouth daily.     No current facility-administered medications for this visit.     Allergies  Allergen Reactions  . Levofloxacin Other (  See Comments)    Hallucinations  . Azithromycin Other (See Comments)    Unknown  . Clarithromycin Other (See Comments)    Felt like body was swollen, felt awful  . Latex Rash and Other (See Comments)    Causes sores      Review of Systems:   General:  normal appetite, decreased energy, no weight gain, no weight loss, no fever  Cardiac:  no chest pain with exertion, no chest pain at rest,+ SOB with exertion, NO resting SOB, no PND, no orthopnea, no palpitations, no arrhythmia, no atrial fibrillation, + LE edema, + dizzy spells, NO syncope  Respiratory:  + shortness of breath, NO home oxygen, NO productive cough, NO dry cough, NO bronchitis, NO wheezing, NO hemoptysis, no asthma, no pain with inspiration or cough, no sleep apnea, no CPAP at night  GI:   + difficulty swallowing, + reflux, no frequent heartburn, no hiatal hernia, no abdominal pain, no constipation, no diarrhea, no hematochezia, no hematemesis, no melena  Villarreal:   no dysuria,  + frequency, no urinary tract infection, no hematuria, no enlarged prostate, no kidney stones, + kidney disease  Vascular:  no pain suggestive of claudication, no pain in feet, no leg cramps, no varicose veins, no DVT, no non-healing foot ulcer  Neuro:   no stroke, + TIA's, no seizures, no headaches, no temporary blindness one eye,  no slurred speech, no peripheral neuropathy, + chronic pain, + instability of gait, no  memory/cognitive dysfunction  Musculoskeletal: + arthritis, no joint swelling, no myalgias, mild difficulty walking, limited mobility   Skin:   no rash, no itching, no skin infections, no pressure sores or ulcerations  Psych:   no anxiety, no depression, no nervousness, no unusual recent stress  Eyes:   no blurry vision, no floaters, no recent vision changes, + wears glasses or contacts  ENT:   no hearing loss, no loose or painful teeth, no dentures, last saw dentist 02/26/3018  Hematologic:  + easy bruising, no abnormal bleeding, no clotting disorder, no frequent epistaxis  Endocrine:  no diabetes, does not check CBG's at home           Physical Exam:   BP 138/73 (BP Location: Left Arm, Patient Position: Sitting, Cuff Size: Small)   Pulse 69   Resp 18   Ht 5' (1.524 m)   Wt 115 lb (52.2 kg)   SpO2 97% Comment: RA  BMI 22.46 kg/m   General:  Elderly, somewhat frail-appearing  HEENT:  Unremarkable   Neck:   no JVD, no bruits, no adenopathy   Chest:   clear to auscultation, symmetrical breath sounds, no wheezes, no rhonchi   CV:   RRR, grade III/VI crescendo/decrescendo murmur heard best at RSB,  no diastolic murmur  Abdomen:  soft, non-tender, no masses   Extremities:  warm, well-perfused, pulses diminished but palpable, no LE edema  Rectal/Villarreal  Deferred  Neuro:   Grossly non-focal and symmetrical throughout  Skin:   Clean and dry, no rashes, no breakdown   Diagnostic Tests:  TRANSTHORACIC ECHOCARDIOGRAM  Report from transthoracic echocardiogram performed at [facility] has been reviewed and compared directly with the report and images from stress echocardiogram performed January 08, 2018.Marland Kitchen  By report the patient has severe aortic stenosis with preserved left ventricular function.  Peak velocity across the aortic valve was measured 4.1 m/s corresponding to mean transvalvular gradient estimated 42 mmHg.  Ejection fraction was estimated greater than 70%.  Hemodynamic findings  were similar to  the baseline examination performed at the time of stress echocardiography on January 08, 2018.   Stress Echocardiography  Patient:    Naevia, Unterreiner MR #:       [id number] Study Date: 01/08/2018 Gender:     F Age:        89 Height:     152.4 cm Weight:     54.5 kg BSA:        1.53 m^2 Pt. Status: Room:   ATTENDING    Default, Provider [phone]  [place], MD  [place], MD  REFERRING    Redding Ii, [place], [place]  SONOGRAPHER  Cardell Peach, RDCS  cc:  -------------------------------------------------------------------  ------------------------------------------------------------------- Indications:      Aortic stenosis 424.1.  ------------------------------------------------------------------- History:   PMH:   Murmur.  Aortic valve disease.  Risk factors: Hypertension.  ------------------------------------------------------------------- Study Conclusions  - Aortic valve: Valve area (VTI): 0.7 cm^2. Valve area (Vmax): 0.76   cm^2. Valve area (Vmean): 0.76 cm^2. - Stress: There was a normal resting blood pressure with a   hypotensive response to stress. Functional capacity was decreased   (greater than 40%). - Stress ECG conclusions: There were no stress arrhythmias or   conduction abnormalities. The stress ECG was non-diagnostic.  ------------------------------------------------------------------- Labs, prior tests, procedures, and surgery: ECG.     Abnormal.  ------------------------------------------------------------------- Study data:   Study status:  Routine.  Consent:  The risks, benefits, and alternatives to the procedure were explained to the patient and informed consent was obtained.  Procedure:  Initial setup. The patient was brought to the laboratory. A baseline ECG was recorded. Surface ECG leads and automatic cuff blood pressure measurements were monitored. Treadmill  exercise testing was performed using the modified Bruce protocol. The patient exercised for 4.5 min, to a maximal work rate of 3.4 mets. Exercise was terminated due to fatigue. Transthoracic stress echocardiography for assessment of valvular function. Image quality was adequate. Images were captured at baseline and peak exercise.  Study completion:  The patient tolerated the procedure well. There were no complications.          Modified Bruce protocol. Stress echocardiography.  Birthdate:  Patient birthdate: 03/24/1926.  Age:  Patient is 84 yr old.  Sex:  Gender: female.    BMI: 23.5 kg/m^2. Blood pressure:     181/72  Patient status:  Outpatient.  Study date:  Study date: 01/08/2018. Study time: 11:35 AM.  -------------------------------------------------------------------  ------------------------------------------------------------------- Aortic valve:  Resting gradient 41mmHG.  Post stress: Peak stress velocity 376 cm/s. mean velocity 297cm/s. Mean gradient 39 mmHG.  Doppler:     VTI ratio of LVOT to aortic valve: 0.22. Valve area (VTI): 0.7 cm^2. Indexed valve area (VTI): 0.46 cm^2/m^2. Peak velocity ratio of LVOT to aortic valve: 0.24. Valve area (Vmax): 0.76 cm^2. Indexed valve area (Vmax): 0.5 cm^2/m^2. Mean velocity ratio of LVOT to aortic valve: 0.24. Valve area (Vmean): 0.76 cm^2. Indexed valve area (Vmean): 0.5 cm^2/m^2.   Resting mean gradient (S): 44 mm Hg. Peak gradient (S): 75 mm Hg.   ------------------------------------------------------------------- Mitral valve:   Doppler:     Peak gradient (D): 9 mm Hg.   ------------------------------------------------------------------- Baseline ECG:  Normal.  Normal sinus rhythm.  Nonspecific ST changes.  ------------------------------------------------------------------- Stress protocol:  +---------------------+---+------------+--------+ !Stage                !HR !BP (mmHg)    !Symptoms! +---------------------+---+------------+--------+ !Baseline             !  70 !181/72 (108)!None    ! +---------------------+---+------------+--------+ !Stage 0              !105!------------!--------! +---------------------+---+------------+--------+ !Stage 1/2            !116!------------!--------! +---------------------+---+------------+--------+ !Immediate post stress!113!------------!--------! +---------------------+---+------------+--------+ !Recovery; 1 min      !88 !------------!--------! +---------------------+---+------------+--------+ !Recovery; 2 min      !82 !------------!--------! +---------------------+---+------------+--------+  ------------------------------------------------------------------- Stress results:   Maximal heart rate during stress was 116 bpm (89% of maximal predicted heart rate). The maximal predicted heart rate was 131 bpm.The target heart rate was achieved. The heart rate response to stress was normal. There was a normal resting blood pressure with a hypotensive response to stress. The rate-pressure product for the peak heart rate and blood pressure was 12670 mm Hg/min.  The patient experienced no chest pain during stress. Functional capacity was decreased (greater than 40%).  ------------------------------------------------------------------- Stress ECG:  There were no stress arrhythmias or conduction abnormalities.  The stress ECG was non-diagnostic.  ------------------------------------------------------------------- Measurements   Left ventricle                            Value          Reference  LV ID, ED, PLAX chordal           (L)     33.4  mm       43 - 52  LV ID, ES, PLAX chordal           (L)     17.9  mm       23 - 38  LV fx shortening, PLAX chordal            46    %        >=29  LV PW thickness, ED                       10.2  mm       ---------  IVS/LV PW ratio, ED               (H)     1.49           <=1.3  Stroke  volume, 2D                         84    ml       ---------  Stroke volume/bsa, 2D                     55    ml/m^2   ---------    Ventricular septum                        Value          Reference  IVS thickness, ED                         15.2  mm       ---------    LVOT                                      Value          Reference  LVOT ID, S  20    mm       ---------  LVOT area                                 3.14  cm^2     ---------  LVOT peak velocity, S                     105   cm/s     ---------  LVOT mean velocity, S                     75.8  cm/s     ---------  LVOT VTI, S                               26.8  cm       ---------    Aortic valve                              Value          Reference  Aortic valve peak velocity, S             434   cm/s     ---------  Aortic valve mean velocity, S             312   cm/s     ---------  Aortic valve VTI, S                       121   cm       ---------  Aortic mean gradient, S                   40    mm Hg    ---------  Aortic peak gradient, S                   75    mm Hg    ---------  VTI ratio, LVOT/AV                        0.22           ---------  Aortic valve area, VTI                    0.7   cm^2     ---------  Aortic valve area/bsa, VTI                0.46  cm^2/m^2 ---------  Velocity ratio, peak, LVOT/AV             0.24           ---------  Aortic valve area, peak velocity          0.76  cm^2     ---------  Aortic valve area/bsa, peak               0.5   cm^2/m^2 ---------  velocity  Velocity ratio, mean, LVOT/AV             0.24           ---------  Aortic valve area, mean velocity          0.76  cm^2     ---------  Aortic valve area/bsa, mean  0.5   cm^2/m^2 ---------  velocity    Aorta                                     Value          Reference  Aortic root ID, ED                        27    mm       ---------    Left atrium                               Value           Reference  LA ID, A-P, ES                            36    mm       ---------  LA ID/bsa, A-P                    (H)     2.36  cm/m^2   <=2.2    Mitral valve                              Value          Reference  Mitral E-wave peak velocity               147   cm/s     ---------  Mitral A-wave peak velocity               118   cm/s     ---------  Mitral deceleration time                  194   ms       150 - 230  Mitral peak gradient, D                   9     mm Hg    ---------  Mitral E/A ratio, peak                    1.2            ---------  Legend: (L)  and  (H)  mark values outside specified reference range.  ------------------------------------------------------------------- Prepared and Electronically Authenticated by  Shirlee More, MD 2019-04-10T13:52:39   RIGHT/LEFT HEART CATH AND CORONARY ANGIOGRAPHY  Conclusion     Ost 1st Diag lesion is 50% stenosed.  Prox Cx lesion is 40% stenosed.  There is severe aortic valve stenosis.   1.  Calcified coronary arteries without significant stenosis.  Mild proximal left circumflex stenosis, minimal irregularity of the RCA, minimal irregularity of the LAD and moderate stenosis of an ostial diagonal 2.  Severely calcified aortic valve with known severe aortic stenosis by noninvasive assessment 3.  Normal right heart hemodynamics with preserved cardiac output and normal intracardiac filling pressures  Recommendations: Continue multidisciplinary evaluation for TAVR.  Medical therapy for mild nonobstructive CAD.   Indications   Severe aortic stenosis [I35.0 (ICD-10-CM)]  Procedural Details/Technique   Technical Details INDICATION: Severe, Stage D1 Aortic Stenosis  PROCEDURAL DETAILS: There was an indwelling IV in a right  antecubital vein. Using normal sterile technique, the IV was changed out for a 5 Fr brachial sheath over a 0.018 inch wire. The right wrist was then prepped, draped, and anesthetized with 1% lidocaine.  Using the modified Seldinger technique a 5/6 French Slender sheath was placed in the right radial artery. Intra-arterial verapamil was administered through the radial artery sheath. IV heparin was administered after a JR4 catheter was advanced into the central aorta. A Swan-Ganz catheter was used for the right heart catheterization. Standard protocol was followed for recording of right heart pressures and sampling of oxygen saturations. Fick cardiac output was calculated. Standard Judkins catheters were used for selective coronary angiography. Attempts are not made to cross the aortic valve. There were no immediate procedural complications. The patient was transferred to the post catheterization recovery area for further monitoring.     Estimated blood loss <50 mL.  During this procedure the patient was administered the following to achieve and maintain moderate conscious sedation: Versed 1 mg, while the patient's heart rate, blood pressure, and oxygen saturation were continuously monitored. The period of conscious sedation was 34 minutes, of which I was present face-to-face 100% of this time.  Coronary Findings   Diagnostic  Dominance: Right  Left Main  There is mild diffuse disease throughout the vessel. The vessel is moderately calcified. The left main is calcified with no significant stenosis  Left Anterior Descending  The vessel exhibits minimal luminal irregularities. The LAD is patent throughout. The distal vessel has multiple angulated segments but there are no stenoses. The origin of the first diagonal branch has 50% stenosis.  First Diagonal Branch  Ost 1st Diag lesion 50% stenosed  Ost 1st Diag lesion is 50% stenosed.  Left Circumflex  There is mild diffuse disease throughout the vessel.  Prox Cx lesion 40% stenosed  Prox Cx lesion is 40% stenosed. The lesion is moderately calcified.  First Obtuse Marginal Branch  Vessel is moderate in size. There is mild disease in the vessel.   Second Obtuse Marginal Branch  There is mild disease in the vessel.  Right Coronary Artery  Vessel is moderate in size. There is mild diffuse disease throughout the vessel. The origin of the RCA is calcified around the right cusp. The vessel has moderate diffuse calcification. The vessel is widely patent with minor diffuse irregularities noted but no significant stenosis.  Intervention   No interventions have been documented.  Left Heart   Aortic Valve There is severe aortic valve stenosis. The aortic valve is calcified. There is restricted aortic valve motion.  Coronary Diagrams   Diagnostic Diagram       Implants    No implant documentation for this case.  MERGE Images   Show images for CARDIAC CATHETERIZATION   Link to Procedure Log   Procedure Log    Hemo Data    Most Recent Value  Fick Cardiac Output 6.11 L/min  Fick Cardiac Output Index 4.16 (L/min)/BSA  RA A Wave 6 mmHg  RA V Wave 4 mmHg  RA Mean 2 mmHg  RV Systolic Pressure 38 mmHg  RV Diastolic Pressure 0 mmHg  RV EDP 6 mmHg  PA Systolic Pressure 38 mmHg  PA Diastolic Pressure 12 mmHg  PA Mean 25 mmHg  PW A Wave 13 mmHg  PW V Wave 17 mmHg  PW Mean 11 mmHg  AO Systolic Pressure 161 mmHg  AO Diastolic Pressure 61 mmHg  AO Mean 100 mmHg  QP/QS 1  TPVR Index 6.01 HRUI  TSVR  Index 24.05 HRUI  PVR SVR Ratio 0.14  TPVR/TSVR Ratio 0.25     Cardiac TAVR CT  TECHNIQUE: The patient was scanned on a [employer]. A 120 kV retrospective scan was triggered in the descending thoracic aorta at 111 HU's. Gantry rotation speed was 250 msecs and collimation was .6 mm. No beta blockade or nitro were given. The 3D data set was reconstructed in 5% intervals of the R-R cycle. Systolic and diastolic phases were analyzed on a dedicated work station using MPR, MIP and VRT modes. The patient received 80 cc of contrast.  FINDINGS: Aortic Valve: Trileaflet, severely thickened, moderately calcified aortic  valve with mild calcifications extending asymmetrically into the LVOT under the right and left coronary cusps.  Aorta: Normal size with moderate diffuse calcifications and atherosclerosis, no dissection.  Sinotubular Junction: 24 x 24 mm  Ascending Thoracic Aorta: 29 x 29 mm  Aortic Arch: 23 x 23 mm  Descending Thoracic Aorta: 19 x 19 mm  Sinus of Valsalva Measurements:  Non-coronary: 31 mm  Right -coronary: 30 mm  Left -coronary: 29 mm  Sinus of Valsalva Height:  Right -coronary: 22 mm  Left -coronary: 21 mm  Coronary Artery Height above Annulus:  Left Main: 17 mm  Right Coronary: 18 mm  Virtual Basal Annulus Measurements:  Maximum/Minimum Diameter: 22.7 x 18.5 mm  Mean Diameter: 20.4 mm  Perimeter: 65.1 mm  Area: 326 mm2  Optimum Fluoroscopic Angle for Delivery: LAO 24 CAU 20  IMPRESSION: 1. Trileaflet, severely thickened, moderately calcified aortic valve with mild calcifications extending asymmetrically into the LVOT under the right and left coronary cusps. Annular measurements suitable for delivery of a 20 mm Edwards-SAPIEN 3 valve or a 26 mm Medtronic Evolut R valve.  2. Sufficient coronary to annulus distance.  3. Optimum Fluoroscopic Angle for Delivery: LAO 24 CAU 20  4. No thrombus in the left atrial appendage.   Electronically Signed   By: Ena Dawley   On: 02/20/2018 18:29    CT ANGIOGRAPHY CHEST, ABDOMEN AND PELVIS  TECHNIQUE: Multidetector CT imaging through the chest, abdomen and pelvis was performed using the standard protocol during bolus administration of intravenous contrast. Multiplanar reconstructed images and MIPs were obtained and reviewed to evaluate the vascular anatomy.  CONTRAST:  163mL ISOVUE-370 IOPAMIDOL (ISOVUE-370) INJECTION 76%  COMPARISON:  No prior chest CT. CT the abdomen and pelvis 01/05/2005.  FINDINGS: CTA CHEST FINDINGS  Cardiovascular: Heart size is mildly  enlarged. There is no significant pericardial fluid, thickening or pericardial calcification. There is aortic atherosclerosis, as well as atherosclerosis of the great vessels of the mediastinum and the coronary arteries, including calcified atherosclerotic plaque in the left main, left anterior descending, left circumflex and right coronary arteries. Calcifications of the aortic valve.  Mediastinum/Lymph Nodes: No pathologically enlarged mediastinal or hilar lymph nodes. Esophagus is unremarkable in appearance. No axillary lymphadenopathy.  Lungs/Pleura: No suspicious appearing pulmonary nodules or masses. No acute consolidative airspace disease. No pleural effusions.  Musculoskeletal/Soft Tissues: There are no aggressive appearing lytic or blastic lesions noted in the visualized portions of the skeleton.  CTA ABDOMEN AND PELVIS FINDINGS  Hepatobiliary: No suspicious cystic or solid hepatic lesions. No intra or extrahepatic biliary ductal dilatation. Gallbladder is normal in appearance.  Pancreas: In the distal body and proximal tail of the pancreas there is a well-defined 1.5 x 1.0 x 2.1 cm low-attenuation lesion (axial image 108 of series 6, and coronal image 42 of series 8)) which previously measured 6 mm on prior  study from 01/05/2005. This lesion has no mural nodularity or definite internal soft tissue, and does not appear to communicate with the main pancreatic duct. No other suspicious appearing pancreatic mass. No pancreatic ductal dilatation. No peripancreatic fluid collections or inflammatory changes.  Spleen: Unremarkable.  Adrenals/Urinary Tract: Low-attenuation lesions in both kidneys, compatible with simple cysts, the largest of which is in the upper pole of the left kidney measuring 1.6 cm in diameter. No hydroureteronephrosis. Urinary bladder is normal in appearance. Bilateral adrenal glands are normal in appearance.  Stomach/Bowel: Normal  appearance of the stomach. No pathologic dilatation of small bowel or colon. Numerous colonic diverticulae are noted, particularly in the sigmoid colon, without surrounding inflammatory changes to suggest an acute diverticulitis at this time.  Vascular/Lymphatic: Aortic atherosclerosis, without evidence of aneurysm or dissection in the abdominal or pelvic vasculature. Relatively large noncalcified atheromatous plaque in the proximal abdominal aorta at the level of the aortic hiatus (axial image 85 of series 6). Vascular findings and measurements pertinent to potential TAVR procedure, as detailed below. Celiac axis, superior mesenteric artery and inferior mesenteric artery are all patent without hemodynamically significant stenosis. Single renal arteries are both patent without hemodynamically significant stenosis. No lymphadenopathy noted in the abdomen or pelvis.  Reproductive: Status post hysterectomy. Ovaries are not confidently identified may be surgically absent or atrophic.  Other: No significant volume of ascites.  No pneumoperitoneum.  Musculoskeletal: There are no aggressive appearing lytic or blastic lesions noted in the visualized portions of the skeleton.  VASCULAR MEASUREMENTS PERTINENT TO TAVR:  AORTA:  Minimal Aortic Diameter-11 x 12 mm  Severity of Aortic Calcification-moderate  RIGHT PELVIS:  Right Common Iliac Artery -  Minimal Diameter-8.4 x 7.1 mm  Tortuosity-mild  Calcification-mild  Right External Iliac Artery -  Minimal Diameter-6.1 x 6.4 mm  Tortuosity-moderate  Calcification-none  Right Common Femoral Artery -  Minimal Diameter-6.2 x 6.5 mm  Tortuosity-mild  Calcification-none  LEFT PELVIS:  Left Common Iliac Artery -  Minimal Diameter-8.3 x 7.0 mm  Tortuosity-mild  Calcification-mild  Left External Iliac Artery -  Minimal Diameter-6.0 x 5.4  mm  Tortuosity-moderate  Calcification-none  Left Common Femoral Artery -  Minimal Diameter-5.7 x 5.7 mm  Tortuosity-mild  Calcification-none  Review of the MIP images confirms the above findings.  IMPRESSION: 1. Vascular findings and measurements pertinent to potential TAVR procedure, as detailed above. 2. Thickening calcification of the aortic valve, compatible with the reported clinical history of severe aortic stenosis. 3. Aortic atherosclerosis, in addition to left main and 3 vessel coronary artery disease. In addition, there is a relatively large noncalcified atheromatous plaque in the aorta at the level of the aortic hiatus, which protrudes into the lumen and could have implications for during catheter manipulation at time of TAVR procedure. 4. 1.5 x 1.0 x 2.1 cm cystic lesion at the junction of distal body and tail of the pancreas. This lesion has grown slightly when compared to remote prior study from 2006, but is favored to be benign. Repeat pancreatic protocol CT scan or abdominal MRI with and without IV gadolinium with MRCP is recommended in 2 years to ensure continued stability. This recommendation follows ACR consensus guidelines: Management of Incidental Pancreatic Cysts: A White Paper of the ACR Incidental Findings Committee. [place] [id number];[id number]. 5. Additional incidental findings, as above.  Aortic Atherosclerosis (ICD10-I70.0).   Electronically Signed   By: Vinnie Langton M.D.   On: 02/21/2018 13:08   STS Risk Calculator  Procedure: Isolated AVR CALCULATE  Risk of Mortality:  2.137% Renal Failure:  1.571% Permanent Stroke:  2.037% Prolonged Ventilation:  6.962% DSW Infection:  0.126% Reoperation:  2.456% Morbidity or Mortality:  10.944% Short Length of Stay:  20.994% Long Length of Stay:  6.614%    Impression:  Patient has stage D severe symptomatic aortic stenosis.  She presents with progressive  symptoms of exertional shortness of breath and fatigue consistent with chronic diastolic congestive heart failure, [place] Heart Association functional class IIb - III. I suspect that some of her symptoms are related to generalized physical deconditioning as she has become somewhat less mobile because of chronic pain in her back.  She appears somewhat frail as noted by a frailty rating of 7/12 on her recent short physical performance battery.  I have personally reviewed the patient's recent transthoracic echocardiogram performed at the time of exercise treadmill stress echocardiography, diagnostic cardiac catheterization, and CT angiogram.  Echocardiograms confirmed the presence of severe aortic stenosis with peak velocity across aortic valve measured between 4.1 and 4.3 m/s at rest corresponding to mean transvalvular gradients greater than 40 mmHg.  Left ventricular systolic function remains normal.  Diagnostic cardiac catheterization was notable for the absence of significant coronary artery disease and revealed normal right-sided pressures and preserved cardiac output.  I agree the patient would benefit from aortic valve replacement but I would be reluctant to consider this patient a candidate for conventional surgery given her advanced age and somewhat frail physical stature.  Moreover, cardiac gated CT angiogram documents presence of relatively small size aortic root.  Under the circumstances I feel that risks associated with conventional surgery would likely be considerably higher than that predicted using the STS risk calculator.  However, her aortic annulus appears large enough for treatment by transcatheter aortic valve replacement using a 23 mm Edwards Sapien 3 transcatheter heart valve without any other significant complicating features.  CTA of the aorta and iliac vessels demonstrate what appears to be adequate pelvic vascular access to facilitate a transfemoral approach.  There does appear to be some  significant mural plaque in the suprarenal portion of the abdominal aorta which might slightly increased risk of atheroembolization.  In addition, there is a small, benign-appearing lesion in the tail of the pancreas which was seen on previous CT imaging performed in 2006.    Plan:  The patient and her husband were counseled at length regarding treatment alternatives for management of severe symptomatic aortic stenosis. Alternative approaches such as conventional aortic valve replacement, transcatheter aortic valve replacement, and continued medical therapy without intervention were compared and contrasted at length.  The risks associated with conventional surgical aortic valve replacement were discussed in detail, as were expectations for post-operative convalescence, and why I would be reluctant to consider this patient a candidate for conventional surgery.  Issues specific to transcatheter aortic valve replacement were discussed including questions about long term valve durability, the potential for paravalvular leak, possible increased risk of need for permanent pacemaker placement, and other technical complications related to the procedure itself.  Long-term prognosis with medical therapy was discussed. This discussion was placed in the context of the patient's own specific clinical presentation and past medical history.  All of their questions have been addressed.  The patient desires to proceed with transcatheter aortic valve replacement in the near future.  We tentatively plan to proceed with surgery on March 18, 2018.  Following the decision to proceed with transcatheter aortic valve replacement, a discussion has been held regarding what types of management  strategies would be attempted intraoperatively in the event of life-threatening complications, including whether or not the patient would be considered a candidate for the use of cardiopulmonary bypass and/or conversion to open sternotomy for  attempted surgical intervention.  The patient has been advised of a variety of complications that might develop including but not limited to risks of death, stroke, paravalvular leak, aortic dissection or other major vascular complications, aortic annulus rupture, device embolization, cardiac rupture or perforation, mitral regurgitation, acute myocardial infarction, arrhythmia, heart block or bradycardia requiring permanent pacemaker placement, congestive heart failure, respiratory failure, renal failure, pneumonia, infection, other late complications related to structural valve deterioration or migration, or other complications that might ultimately cause a temporary or permanent loss of functional independence or other long term morbidity.  The patient provides full informed consent for the procedure as described and all questions were answered.  I spent in excess of 90 minutes during the conduct of this office consultation and >50% of this time involved direct face-to-face encounter with the patient for counseling and/or coordination of their care.    Kaitlin Villarreal. Roxy Manns, MD 02/28/2018 1:39 PM

## 2018-02-28 NOTE — H&P (View-Only) (Signed)
HE[facility] AND [place] SURGERY CONSULTATION REPORT  Referring Provider is Bettina Gavia, Hilton Cork, MD PCP is Angelina Sheriff, MD  Chief Complaint  Patient presents with  . Aortic Stenosis    1st TAVR evaluation    HPI:  Patient is an 84 year old female with history of aortic stenosis, hypertension, chronic back pain, and stage III chronic kidney disease who has been referred for surgical consultation to discuss treatment options for management of severe symptomatic aortic stenosis.  The patient states that she was first noted to have a heart murmur on routine physical examination by her gynecologist nearly 20 years ago.  She was eventually referred for cardiology evaluation and has been followed for several years by Dr. Bettina Gavia.  She has remained reasonably active physically despite her advanced age, although she has slowed down considerably over the last few years.  She has developed exertional shortness of breath and fatigue, some of which the patient has blamed on decreased activity because of problems with her back.  She underwent a routine transthoracic echocardiogram on December 23, 2017 at [facility].  By report this echocardiogram revealed severe aortic stenosis with normal left ventricular systolic function.  Ejection fraction was estimated greater than 70%.  Peak velocity across aortic valve was reported 4.1 m/s with peak and mean transvalvular gradients estimated 69 and 42 mmHg respectively.  Aortic valve area was calculated 0.87 cm.  Exercise stress echocardiogram was performed January 08, 2018.  This confirmed the presence of severe aortic stenosis associated with hypertensive response to stress.  Peak velocity across aortic valve at rest was measured 4.3 m/s corresponding to mean transvalvular gradient estimated 40 mmHg.  The DVI was 0.24 with aortic valve area calculated 0.76 cm.  The patient was referred to Dr. Burt Knack and  underwent diagnostic cardiac catheterization Feb 06, 2018.  She was found to have moderate nonobstructive coronary artery disease with normal right heart hemodynamics and preserved cardiac output.  CT angiography was performed and the patient was referred for surgical consultation.  The patient is married with her husband of 80 years and lives in [place].  She has been retired for more than 25 years.  She has remained reasonably active throughout retirement and remains functionally independent.  She admits to progressive exertional fatigue and generalized weakness.  She states that she is limited by chronic back pain.  She has developed exertional shortness of breath which occurs with moderate and sometimes low level activity.  She denies any resting shortness of breath, PND, orthopnea, palpitations, or syncope.  She reports occasional mild dizzy spells.  She has occasionally slight amount of lower extremity edema.  She denies any exertional chest pain or chest tightness.  Past Medical History:  Diagnosis Date  . Abnormal heart rhythm   . Acquired hammer toe of left foot   . Aortic stenosis   . Bone spur of toe of left foot   . Essential hypertension   . Heart murmur   . Midline low back pain without sciatica   . Nocturia   . Other hyperlipidemia   . Pancreatic mass    benign appearing but needs f/u  . 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 Polyuria   . Stage 3 chronic kidney disease [facility])     Past Surgical History:  Procedure Laterality Date  . ABDOMINAL HYSTERECTOMY    . APPENDECTOMY    . HERNIA REPAIR    . RIGHT/LEFT HEART  CATH AND CORONARY ANGIOGRAPHY N/A 02/06/2018   Procedure: RIGHT/LEFT HEART CATH AND CORONARY ANGIOGRAPHY;  Surgeon: Sherren Mocha, MD;  Location: [facility];  Service: Cardiovascular;  Laterality: N/A;  . TONSILLECTOMY    . WISDOM TOOTH EXTRACTION      Family History  Problem Relation Age of Onset  . Heart disease Mother   . Uterine cancer Sister     . Congenital heart disease Brother   . Heart attack Brother     Social History   Socioeconomic History  . Marital status: Married    Spouse name: Not on file  . Number of children: Not on file  . Years of education: Not on file  . Highest education level: Not on file  Occupational History  . Not on file  Social Needs  . Financial resource strain: Not on file  . Food insecurity:    Worry: Not on file    Inability: Not on file  . Transportation needs:    Medical: Not on file    Non-medical: Not on file  Tobacco Use  . Smoking status: Never Smoker  . Smokeless tobacco: Never Used  Substance and Sexual Activity  . Alcohol use: No  . Drug use: No  . Sexual activity: Not on file  Lifestyle  . Physical activity:    Days per week: Not on file    Minutes per session: Not on file  . Stress: Not on file  Relationships  . Social connections:    Talks on phone: Not on file    Gets together: Not on file    Attends religious service: Not on file    Active member of club or organization: Not on file    Attends meetings of clubs or organizations: Not on file    Relationship status: Not on file  . Intimate partner violence:    Fear of current or ex partner: Not on file    Emotionally abused: Not on file    Physically abused: Not on file    Forced sexual activity: Not on file  Other Topics Concern  . Not on file  Social History Narrative  . Not on file    Current Outpatient Medications  Medication Sig Dispense Refill  . acetaminophen (TYLENOL) 650 MG CR tablet Take 650-1,300 mg by mouth every 8 (eight) hours as needed for pain.    Marland Kitchen amLODipine (NORVASC) 2.5 MG tablet Take 2.5 mg by mouth daily.     Marland Kitchen aspirin EC 81 MG tablet Take 81 mg by mouth daily.     Marland Kitchen estradiol (ESTRACE) 1 MG tablet Take 0.5 mg by mouth daily.    Marland Kitchen labetalol (NORMODYNE) 100 MG tablet Take 100 mg by mouth 2 (two) times daily.     . Omega-3 Fatty Acids (FISH OIL) 1000 MG CAPS Take 1,000 mg by mouth  daily.    Vladimir Faster Glycol-Propyl Glycol (SYSTANE OP) Place 1 drop into both eyes daily as needed (for dry eyes).    . pravastatin (PRAVACHOL) 20 MG tablet Take 20 mg by mouth at bedtime.     . Probiotic Product ([employer] PROBIOTIC) CHEW Chew 2 each by mouth daily.    . ranitidine (ZANTAC) 300 MG tablet Take 300 mg by mouth at bedtime.     . Wheat Dextrin (BENEFIBER DRINK MIX PO) Take 1 packet by mouth daily.     No current facility-administered medications for this visit.     Allergies  Allergen Reactions  . Levofloxacin Other (  See Comments)    Hallucinations  . Azithromycin Other (See Comments)    Unknown  . Clarithromycin Other (See Comments)    Felt like body was swollen, felt awful  . Latex Rash and Other (See Comments)    Causes sores      Review of Systems:   General:  normal appetite, decreased energy, no weight gain, no weight loss, no fever  Cardiac:  no chest pain with exertion, no chest pain at rest,+ SOB with exertion, NO resting SOB, no PND, no orthopnea, no palpitations, no arrhythmia, no atrial fibrillation, + LE edema, + dizzy spells, NO syncope  Respiratory:  + shortness of breath, NO home oxygen, NO productive cough, NO dry cough, NO bronchitis, NO wheezing, NO hemoptysis, no asthma, no pain with inspiration or cough, no sleep apnea, no CPAP at night  GI:   + difficulty swallowing, + reflux, no frequent heartburn, no hiatal hernia, no abdominal pain, no constipation, no diarrhea, no hematochezia, no hematemesis, no melena  GU:   no dysuria,  + frequency, no urinary tract infection, no hematuria, no enlarged prostate, no kidney stones, + kidney disease  Vascular:  no pain suggestive of claudication, no pain in feet, no leg cramps, no varicose veins, no DVT, no non-healing foot ulcer  Neuro:   no stroke, + TIA's, no seizures, no headaches, no temporary blindness one eye,  no slurred speech, no peripheral neuropathy, + chronic pain, + instability of gait, no  memory/cognitive dysfunction  Musculoskeletal: + arthritis, no joint swelling, no myalgias, mild difficulty walking, limited mobility   Skin:   no rash, no itching, no skin infections, no pressure sores or ulcerations  Psych:   no anxiety, no depression, no nervousness, no unusual recent stress  Eyes:   no blurry vision, no floaters, no recent vision changes, + wears glasses or contacts  ENT:   no hearing loss, no loose or painful teeth, no dentures, last saw dentist 02/26/3018  Hematologic:  + easy bruising, no abnormal bleeding, no clotting disorder, no frequent epistaxis  Endocrine:  no diabetes, does not check CBG's at home           Physical Exam:   BP 138/73 (BP Location: Left Arm, Patient Position: Sitting, Cuff Size: Small)   Pulse 69   Resp 18   Ht 5' (1.524 m)   Wt 115 lb (52.2 kg)   SpO2 97% Comment: RA  BMI 22.46 kg/m   General:  Elderly, somewhat frail-appearing  HEENT:  Unremarkable   Neck:   no JVD, no bruits, no adenopathy   Chest:   clear to auscultation, symmetrical breath sounds, no wheezes, no rhonchi   CV:   RRR, grade III/VI crescendo/decrescendo murmur heard best at RSB,  no diastolic murmur  Abdomen:  soft, non-tender, no masses   Extremities:  warm, well-perfused, pulses diminished but palpable, no LE edema  Rectal/GU  Deferred  Neuro:   Grossly non-focal and symmetrical throughout  Skin:   Clean and dry, no rashes, no breakdown   Diagnostic Tests:  TRANSTHORACIC ECHOCARDIOGRAM  Report from transthoracic echocardiogram performed at [facility] has been reviewed and compared directly with the report and images from stress echocardiogram performed January 08, 2018.Marland Kitchen  By report the patient has severe aortic stenosis with preserved left ventricular function.  Peak velocity across the aortic valve was measured 4.1 m/s corresponding to mean transvalvular gradient estimated 42 mmHg.  Ejection fraction was estimated greater than 70%.  Hemodynamic findings  were similar to  the baseline examination performed at the time of stress echocardiography on January 08, 2018.   Stress Echocardiography  Patient:    Tilda, Samudio MR #:       [id number] Study Date: 01/08/2018 Gender:     F Age:        89 Height:     152.4 cm Weight:     54.5 kg BSA:        1.53 m^2 Pt. Status: Room:   ATTENDING    Default, Provider [phone]  [place], MD  [place], MD  REFERRING    [place], [place], [place]  SONOGRAPHER  Cardell Peach, RDCS  cc:  -------------------------------------------------------------------  ------------------------------------------------------------------- Indications:      Aortic stenosis 424.1.  ------------------------------------------------------------------- History:   PMH:   Murmur.  Aortic valve disease.  Risk factors: Hypertension.  ------------------------------------------------------------------- Study Conclusions  - Aortic valve: Valve area (VTI): 0.7 cm^2. Valve area (Vmax): 0.76   cm^2. Valve area (Vmean): 0.76 cm^2. - Stress: There was a normal resting blood pressure with a   hypotensive response to stress. Functional capacity was decreased   (greater than 40%). - Stress ECG conclusions: There were no stress arrhythmias or   conduction abnormalities. The stress ECG was non-diagnostic.  ------------------------------------------------------------------- Labs, prior tests, procedures, and surgery: ECG.     Abnormal.  ------------------------------------------------------------------- Study data:   Study status:  Routine.  Consent:  The risks, benefits, and alternatives to the procedure were explained to the patient and informed consent was obtained.  Procedure:  Initial setup. The patient was brought to the laboratory. A baseline ECG was recorded. Surface ECG leads and automatic cuff blood pressure measurements were monitored. Treadmill  exercise testing was performed using the modified Bruce protocol. The patient exercised for 4.5 min, to a maximal work rate of 3.4 mets. Exercise was terminated due to fatigue. Transthoracic stress echocardiography for assessment of valvular function. Image quality was adequate. Images were captured at baseline and peak exercise.  Study completion:  The patient tolerated the procedure well. There were no complications.          Modified Bruce protocol. Stress echocardiography.  Birthdate:  Patient birthdate: [birth year].  Age:  Patient is 84 yr old.  Sex:  Gender: female.    BMI: 23.5 kg/m^2. Blood pressure:     181/72  Patient status:  Outpatient.  Study date:  Study date: 01/08/2018. Study time: 11:35 AM.  -------------------------------------------------------------------  ------------------------------------------------------------------- Aortic valve:  Resting gradient 64mmHG.  Post stress: Peak stress velocity 376 cm/s. mean velocity 297cm/s. Mean gradient 39 mmHG.  Doppler:     VTI ratio of LVOT to aortic valve: 0.22. Valve area (VTI): 0.7 cm^2. Indexed valve area (VTI): 0.46 cm^2/m^2. Peak velocity ratio of LVOT to aortic valve: 0.24. Valve area (Vmax): 0.76 cm^2. Indexed valve area (Vmax): 0.5 cm^2/m^2. Mean velocity ratio of LVOT to aortic valve: 0.24. Valve area (Vmean): 0.76 cm^2. Indexed valve area (Vmean): 0.5 cm^2/m^2.   Resting mean gradient (S): 44 mm Hg. Peak gradient (S): 75 mm Hg.   ------------------------------------------------------------------- Mitral valve:   Doppler:     Peak gradient (D): 9 mm Hg.   ------------------------------------------------------------------- Baseline ECG:  Normal.  Normal sinus rhythm.  Nonspecific ST changes.  ------------------------------------------------------------------- Stress protocol:  +---------------------+---+------------+--------+ !Stage                !HR !BP (mmHg)    !Symptoms! +---------------------+---+------------+--------+ !Baseline             !  70 !181/72 (108)!None    ! +---------------------+---+------------+--------+ !Stage 0              !105!------------!--------! +---------------------+---+------------+--------+ !Stage 1/2            !116!------------!--------! +---------------------+---+------------+--------+ !Immediate post stress!113!------------!--------! +---------------------+---+------------+--------+ !Recovery; 1 min      !88 !------------!--------! +---------------------+---+------------+--------+ !Recovery; 2 min      !82 !------------!--------! +---------------------+---+------------+--------+  ------------------------------------------------------------------- Stress results:   Maximal heart rate during stress was 116 bpm (89% of maximal predicted heart rate). The maximal predicted heart rate was 131 bpm.The target heart rate was achieved. The heart rate response to stress was normal. There was a normal resting blood pressure with a hypotensive response to stress. The rate-pressure product for the peak heart rate and blood pressure was 12670 mm Hg/min.  The patient experienced no chest pain during stress. Functional capacity was decreased (greater than 40%).  ------------------------------------------------------------------- Stress ECG:  There were no stress arrhythmias or conduction abnormalities.  The stress ECG was non-diagnostic.  ------------------------------------------------------------------- Measurements   Left ventricle                            Value          Reference  LV ID, ED, PLAX chordal           (L)     33.4  mm       43 - 52  LV ID, ES, PLAX chordal           (L)     17.9  mm       23 - 38  LV fx shortening, PLAX chordal            46    %        >=29  LV PW thickness, ED                       10.2  mm       ---------  IVS/LV PW ratio, ED               (H)     1.49           <=1.3  Stroke  volume, 2D                         84    ml       ---------  Stroke volume/bsa, 2D                     55    ml/m^2   ---------    Ventricular septum                        Value          Reference  IVS thickness, ED                         15.2  mm       ---------    LVOT                                      Value          Reference  LVOT ID, S  20    mm       ---------  LVOT area                                 3.14  cm^2     ---------  LVOT peak velocity, S                     105   cm/s     ---------  LVOT mean velocity, S                     75.8  cm/s     ---------  LVOT VTI, S                               26.8  cm       ---------    Aortic valve                              Value          Reference  Aortic valve peak velocity, S             434   cm/s     ---------  Aortic valve mean velocity, S             312   cm/s     ---------  Aortic valve VTI, S                       121   cm       ---------  Aortic mean gradient, S                   40    mm Hg    ---------  Aortic peak gradient, S                   75    mm Hg    ---------  VTI ratio, LVOT/AV                        0.22           ---------  Aortic valve area, VTI                    0.7   cm^2     ---------  Aortic valve area/bsa, VTI                0.46  cm^2/m^2 ---------  Velocity ratio, peak, LVOT/AV             0.24           ---------  Aortic valve area, peak velocity          0.76  cm^2     ---------  Aortic valve area/bsa, peak               0.5   cm^2/m^2 ---------  velocity  Velocity ratio, mean, LVOT/AV             0.24           ---------  Aortic valve area, mean velocity          0.76  cm^2     ---------  Aortic valve area/bsa, mean  0.5   cm^2/m^2 ---------  velocity    Aorta                                     Value          Reference  Aortic root ID, ED                        27    mm       ---------    Left atrium                               Value           Reference  LA ID, A-P, ES                            36    mm       ---------  LA ID/bsa, A-P                    (H)     2.36  cm/m^2   <=2.2    Mitral valve                              Value          Reference  Mitral E-wave peak velocity               147   cm/s     ---------  Mitral A-wave peak velocity               118   cm/s     ---------  Mitral deceleration time                  194   ms       150 - 230  Mitral peak gradient, D                   9     mm Hg    ---------  Mitral E/A ratio, peak                    1.2            ---------  Legend: (L)  and  (H)  mark values outside specified reference range.  ------------------------------------------------------------------- Prepared and Electronically Authenticated by  Shirlee More, MD 2019-04-10T13:52:39   RIGHT/LEFT HEART CATH AND CORONARY ANGIOGRAPHY  Conclusion     Ost 1st Diag lesion is 50% stenosed.  Prox Cx lesion is 40% stenosed.  There is severe aortic valve stenosis.   1.  Calcified coronary arteries without significant stenosis.  Mild proximal left circumflex stenosis, minimal irregularity of the RCA, minimal irregularity of the LAD and moderate stenosis of an ostial diagonal 2.  Severely calcified aortic valve with known severe aortic stenosis by noninvasive assessment 3.  Normal right heart hemodynamics with preserved cardiac output and normal intracardiac filling pressures  Recommendations: Continue multidisciplinary evaluation for TAVR.  Medical therapy for mild nonobstructive CAD.   Indications   Severe aortic stenosis [I35.0 (ICD-10-CM)]  Procedural Details/Technique   Technical Details INDICATION: Severe, Stage D1 Aortic Stenosis  PROCEDURAL DETAILS: There was an indwelling IV in a right  antecubital vein. Using normal sterile technique, the IV was changed out for a 5 Fr brachial sheath over a 0.018 inch wire. The right wrist was then prepped, draped, and anesthetized with 1% lidocaine.  Using the modified Seldinger technique a 5/6 French Slender sheath was placed in the right radial artery. Intra-arterial verapamil was administered through the radial artery sheath. IV heparin was administered after a JR4 catheter was advanced into the central aorta. A Swan-Ganz catheter was used for the right heart catheterization. Standard protocol was followed for recording of right heart pressures and sampling of oxygen saturations. Fick cardiac output was calculated. Standard Judkins catheters were used for selective coronary angiography. Attempts are not made to cross the aortic valve. There were no immediate procedural complications. The patient was transferred to the post catheterization recovery area for further monitoring.     Estimated blood loss <50 mL.  During this procedure the patient was administered the following to achieve and maintain moderate conscious sedation: Versed 1 mg, while the patient's heart rate, blood pressure, and oxygen saturation were continuously monitored. The period of conscious sedation was 34 minutes, of which I was present face-to-face 100% of this time.  Coronary Findings   Diagnostic  Dominance: Right  Left Main  There is mild diffuse disease throughout the vessel. The vessel is moderately calcified. The left main is calcified with no significant stenosis  Left Anterior Descending  The vessel exhibits minimal luminal irregularities. The LAD is patent throughout. The distal vessel has multiple angulated segments but there are no stenoses. The origin of the first diagonal branch has 50% stenosis.  First Diagonal Branch  Ost 1st Diag lesion 50% stenosed  Ost 1st Diag lesion is 50% stenosed.  Left Circumflex  There is mild diffuse disease throughout the vessel.  Prox Cx lesion 40% stenosed  Prox Cx lesion is 40% stenosed. The lesion is moderately calcified.  First Obtuse Marginal Branch  Vessel is moderate in size. There is mild disease in the vessel.   Second Obtuse Marginal Branch  There is mild disease in the vessel.  Right Coronary Artery  Vessel is moderate in size. There is mild diffuse disease throughout the vessel. The origin of the RCA is calcified around the right cusp. The vessel has moderate diffuse calcification. The vessel is widely patent with minor diffuse irregularities noted but no significant stenosis.  Intervention   No interventions have been documented.  Left Heart   Aortic Valve There is severe aortic valve stenosis. The aortic valve is calcified. There is restricted aortic valve motion.  Coronary Diagrams   Diagnostic Diagram       Implants    No implant documentation for this case.  MERGE Images   Show images for CARDIAC CATHETERIZATION   Link to Procedure Log   Procedure Log    Hemo Data    Most Recent Value  Fick Cardiac Output 6.11 L/min  Fick Cardiac Output Index 4.16 (L/min)/BSA  RA A Wave 6 mmHg  RA V Wave 4 mmHg  RA Mean 2 mmHg  RV Systolic Pressure 38 mmHg  RV Diastolic Pressure 0 mmHg  RV EDP 6 mmHg  PA Systolic Pressure 38 mmHg  PA Diastolic Pressure 12 mmHg  PA Mean 25 mmHg  PW A Wave 13 mmHg  PW V Wave 17 mmHg  PW Mean 11 mmHg  AO Systolic Pressure 660 mmHg  AO Diastolic Pressure 61 mmHg  AO Mean 100 mmHg  QP/QS 1  TPVR Index 6.01 HRUI  TSVR  Index 24.05 HRUI  PVR SVR Ratio 0.14  TPVR/TSVR Ratio 0.25     Cardiac TAVR CT  TECHNIQUE: The patient was scanned on a [employer]. A 120 kV retrospective scan was triggered in the descending thoracic aorta at 111 HU's. Gantry rotation speed was 250 msecs and collimation was .6 mm. No beta blockade or nitro were given. The 3D data set was reconstructed in 5% intervals of the R-R cycle. Systolic and diastolic phases were analyzed on a dedicated work station using MPR, MIP and VRT modes. The patient received 80 cc of contrast.  FINDINGS: Aortic Valve: Trileaflet, severely thickened, moderately calcified aortic  valve with mild calcifications extending asymmetrically into the LVOT under the right and left coronary cusps.  Aorta: Normal size with moderate diffuse calcifications and atherosclerosis, no dissection.  Sinotubular Junction: 24 x 24 mm  Ascending Thoracic Aorta: 29 x 29 mm  Aortic Arch: 23 x 23 mm  Descending Thoracic Aorta: 19 x 19 mm  Sinus of Valsalva Measurements:  Non-coronary: 31 mm  Right -coronary: 30 mm  Left -coronary: 29 mm  Sinus of Valsalva Height:  Right -coronary: 22 mm  Left -coronary: 21 mm  Coronary Artery Height above Annulus:  Left Main: 17 mm  Right Coronary: 18 mm  Virtual Basal Annulus Measurements:  Maximum/Minimum Diameter: 22.7 x 18.5 mm  Mean Diameter: 20.4 mm  Perimeter: 65.1 mm  Area: 326 mm2  Optimum Fluoroscopic Angle for Delivery: LAO 24 CAU 20  IMPRESSION: 1. Trileaflet, severely thickened, moderately calcified aortic valve with mild calcifications extending asymmetrically into the LVOT under the right and left coronary cusps. Annular measurements suitable for delivery of a 20 mm Edwards-SAPIEN 3 valve or a 26 mm Medtronic Evolut R valve.  2. Sufficient coronary to annulus distance.  3. Optimum Fluoroscopic Angle for Delivery: LAO 24 CAU 20  4. No thrombus in the left atrial appendage.   Electronically Signed   By: Ena Dawley   On: 02/20/2018 18:29    CT ANGIOGRAPHY CHEST, ABDOMEN AND PELVIS  TECHNIQUE: Multidetector CT imaging through the chest, abdomen and pelvis was performed using the standard protocol during bolus administration of intravenous contrast. Multiplanar reconstructed images and MIPs were obtained and reviewed to evaluate the vascular anatomy.  CONTRAST:  128mL ISOVUE-370 IOPAMIDOL (ISOVUE-370) INJECTION 76%  COMPARISON:  No prior chest CT. CT the abdomen and pelvis 01/05/2005.  FINDINGS: CTA CHEST FINDINGS  Cardiovascular: Heart size is mildly  enlarged. There is no significant pericardial fluid, thickening or pericardial calcification. There is aortic atherosclerosis, as well as atherosclerosis of the great vessels of the mediastinum and the coronary arteries, including calcified atherosclerotic plaque in the left main, left anterior descending, left circumflex and right coronary arteries. Calcifications of the aortic valve.  Mediastinum/Lymph Nodes: No pathologically enlarged mediastinal or hilar lymph nodes. Esophagus is unremarkable in appearance. No axillary lymphadenopathy.  Lungs/Pleura: No suspicious appearing pulmonary nodules or masses. No acute consolidative airspace disease. No pleural effusions.  Musculoskeletal/Soft Tissues: There are no aggressive appearing lytic or blastic lesions noted in the visualized portions of the skeleton.  CTA ABDOMEN AND PELVIS FINDINGS  Hepatobiliary: No suspicious cystic or solid hepatic lesions. No intra or extrahepatic biliary ductal dilatation. Gallbladder is normal in appearance.  Pancreas: In the distal body and proximal tail of the pancreas there is a well-defined 1.5 x 1.0 x 2.1 cm low-attenuation lesion (axial image 108 of series 6, and coronal image 42 of series 8)) which previously measured 6 mm on prior  study from 01/05/2005. This lesion has no mural nodularity or definite internal soft tissue, and does not appear to communicate with the main pancreatic duct. No other suspicious appearing pancreatic mass. No pancreatic ductal dilatation. No peripancreatic fluid collections or inflammatory changes.  Spleen: Unremarkable.  Adrenals/Urinary Tract: Low-attenuation lesions in both kidneys, compatible with simple cysts, the largest of which is in the upper pole of the left kidney measuring 1.6 cm in diameter. No hydroureteronephrosis. Urinary bladder is normal in appearance. Bilateral adrenal glands are normal in appearance.  Stomach/Bowel: Normal  appearance of the stomach. No pathologic dilatation of small bowel or colon. Numerous colonic diverticulae are noted, particularly in the sigmoid colon, without surrounding inflammatory changes to suggest an acute diverticulitis at this time.  Vascular/Lymphatic: Aortic atherosclerosis, without evidence of aneurysm or dissection in the abdominal or pelvic vasculature. Relatively large noncalcified atheromatous plaque in the proximal abdominal aorta at the level of the aortic hiatus (axial image 85 of series 6). Vascular findings and measurements pertinent to potential TAVR procedure, as detailed below. Celiac axis, superior mesenteric artery and inferior mesenteric artery are all patent without hemodynamically significant stenosis. Single renal arteries are both patent without hemodynamically significant stenosis. No lymphadenopathy noted in the abdomen or pelvis.  Reproductive: Status post hysterectomy. Ovaries are not confidently identified may be surgically absent or atrophic.  Other: No significant volume of ascites.  No pneumoperitoneum.  Musculoskeletal: There are no aggressive appearing lytic or blastic lesions noted in the visualized portions of the skeleton.  VASCULAR MEASUREMENTS PERTINENT TO TAVR:  AORTA:  Minimal Aortic Diameter-11 x 12 mm  Severity of Aortic Calcification-moderate  RIGHT PELVIS:  Right Common Iliac Artery -  Minimal Diameter-8.4 x 7.1 mm  Tortuosity-mild  Calcification-mild  Right External Iliac Artery -  Minimal Diameter-6.1 x 6.4 mm  Tortuosity-moderate  Calcification-none  Right Common Femoral Artery -  Minimal Diameter-6.2 x 6.5 mm  Tortuosity-mild  Calcification-none  LEFT PELVIS:  Left Common Iliac Artery -  Minimal Diameter-8.3 x 7.0 mm  Tortuosity-mild  Calcification-mild  Left External Iliac Artery -  Minimal Diameter-6.0 x 5.4  mm  Tortuosity-moderate  Calcification-none  Left Common Femoral Artery -  Minimal Diameter-5.7 x 5.7 mm  Tortuosity-mild  Calcification-none  Review of the MIP images confirms the above findings.  IMPRESSION: 1. Vascular findings and measurements pertinent to potential TAVR procedure, as detailed above. 2. Thickening calcification of the aortic valve, compatible with the reported clinical history of severe aortic stenosis. 3. Aortic atherosclerosis, in addition to left main and 3 vessel coronary artery disease. In addition, there is a relatively large noncalcified atheromatous plaque in the aorta at the level of the aortic hiatus, which protrudes into the lumen and could have implications for during catheter manipulation at time of TAVR procedure. 4. 1.5 x 1.0 x 2.1 cm cystic lesion at the junction of distal body and tail of the pancreas. This lesion has grown slightly when compared to remote prior study from 2006, but is favored to be benign. Repeat pancreatic protocol CT scan or abdominal MRI with and without IV gadolinium with MRCP is recommended in 2 years to ensure continued stability. This recommendation follows ACR consensus guidelines: Management of Incidental Pancreatic Cysts: A White Paper of the ACR Incidental Findings Committee. [place] [id number]. 5. Additional incidental findings, as above.  Aortic Atherosclerosis (ICD10-I70.0).   Electronically Signed   By: Vinnie Langton M.D.   On: 02/21/2018 13:08   STS Risk Calculator  Procedure: Isolated AVR CALCULATE  Risk of Mortality:  2.137% Renal Failure:  1.571% Permanent Stroke:  2.037% Prolonged Ventilation:  6.962% DSW Infection:  0.126% Reoperation:  2.456% Morbidity or Mortality:  10.944% Short Length of Stay:  20.994% Long Length of Stay:  6.614%    Impression:  Patient has stage D severe symptomatic aortic stenosis.  She presents with progressive  symptoms of exertional shortness of breath and fatigue consistent with chronic diastolic congestive heart failure, [place] Heart Association functional class IIb - III. I suspect that some of her symptoms are related to generalized physical deconditioning as she has become somewhat less mobile because of chronic pain in her back.  She appears somewhat frail as noted by a frailty rating of 7/12 on her recent short physical performance battery.  I have personally reviewed the patient's recent transthoracic echocardiogram performed at the time of exercise treadmill stress echocardiography, diagnostic cardiac catheterization, and CT angiogram.  Echocardiograms confirmed the presence of severe aortic stenosis with peak velocity across aortic valve measured between 4.1 and 4.3 m/s at rest corresponding to mean transvalvular gradients greater than 40 mmHg.  Left ventricular systolic function remains normal.  Diagnostic cardiac catheterization was notable for the absence of significant coronary artery disease and revealed normal right-sided pressures and preserved cardiac output.  I agree the patient would benefit from aortic valve replacement but I would be reluctant to consider this patient a candidate for conventional surgery given her advanced age and somewhat frail physical stature.  Moreover, cardiac gated CT angiogram documents presence of relatively small size aortic root.  Under the circumstances I feel that risks associated with conventional surgery would likely be considerably higher than that predicted using the STS risk calculator.  However, her aortic annulus appears large enough for treatment by transcatheter aortic valve replacement using a 23 mm Edwards Sapien 3 transcatheter heart valve without any other significant complicating features.  CTA of the aorta and iliac vessels demonstrate what appears to be adequate pelvic vascular access to facilitate a transfemoral approach.  There does appear to be some  significant mural plaque in the suprarenal portion of the abdominal aorta which might slightly increased risk of atheroembolization.  In addition, there is a small, benign-appearing lesion in the tail of the pancreas which was seen on previous CT imaging performed in 2006.    Plan:  The patient and her husband were counseled at length regarding treatment alternatives for management of severe symptomatic aortic stenosis. Alternative approaches such as conventional aortic valve replacement, transcatheter aortic valve replacement, and continued medical therapy without intervention were compared and contrasted at length.  The risks associated with conventional surgical aortic valve replacement were discussed in detail, as were expectations for post-operative convalescence, and why I would be reluctant to consider this patient a candidate for conventional surgery.  Issues specific to transcatheter aortic valve replacement were discussed including questions about long term valve durability, the potential for paravalvular leak, possible increased risk of need for permanent pacemaker placement, and other technical complications related to the procedure itself.  Long-term prognosis with medical therapy was discussed. This discussion was placed in the context of the patient's own specific clinical presentation and past medical history.  All of their questions have been addressed.  The patient desires to proceed with transcatheter aortic valve replacement in the near future.  We tentatively plan to proceed with surgery on March 18, 2018.  Following the decision to proceed with transcatheter aortic valve replacement, a discussion has been held regarding what types of management  strategies would be attempted intraoperatively in the event of life-threatening complications, including whether or not the patient would be considered a candidate for the use of cardiopulmonary bypass and/or conversion to open sternotomy for  attempted surgical intervention.  The patient has been advised of a variety of complications that might develop including but not limited to risks of death, stroke, paravalvular leak, aortic dissection or other major vascular complications, aortic annulus rupture, device embolization, cardiac rupture or perforation, mitral regurgitation, acute myocardial infarction, arrhythmia, heart block or bradycardia requiring permanent pacemaker placement, congestive heart failure, respiratory failure, renal failure, pneumonia, infection, other late complications related to structural valve deterioration or migration, or other complications that might ultimately cause a temporary or permanent loss of functional independence or other long term morbidity.  The patient provides full informed consent for the procedure as described and all questions were answered.  I spent in excess of 90 minutes during the conduct of this office consultation and >50% of this time involved direct face-to-face encounter with the patient for counseling and/or coordination of their care.    Valentina Gu. Roxy Manns, MD 02/28/2018 1:39 PM

## 2018-02-28 NOTE — Patient Instructions (Signed)
   Continue taking all current medications without change through the day before surgery.  Have nothing to eat or drink after midnight the night before surgery.  On the morning of surgery do not take any medications   

## 2018-03-05 ENCOUNTER — Other Ambulatory Visit: Payer: Self-pay

## 2018-03-05 DIAGNOSIS — I350 Nonrheumatic aortic (valve) stenosis: Secondary | ICD-10-CM

## 2018-03-06 ENCOUNTER — Encounter: Payer: Self-pay | Admitting: Surgery

## 2018-03-06 ENCOUNTER — Other Ambulatory Visit: Payer: Self-pay

## 2018-03-06 ENCOUNTER — Institutional Professional Consult (permissible substitution) (INDEPENDENT_AMBULATORY_CARE_PROVIDER_SITE_OTHER): Payer: Medicare Other | Admitting: Surgery

## 2018-03-06 VITALS — BP 152/60 | HR 77 | Resp 18 | Ht 60.0 in | Wt 115.0 lb

## 2018-03-06 DIAGNOSIS — I350 Nonrheumatic aortic (valve) stenosis: Secondary | ICD-10-CM

## 2018-03-06 NOTE — Patient Instructions (Signed)
You are scheduled for Pre Admission Testing on Friday, March 14, 2018 at 10:00 AM.  Please arrive in Admitting at [facility] (Main Entrance A, Valet Parking) at 9:45 AM for check-in. No restrictions for this appointment.  03/18/2018 TAVR:  Please arrive in Admitting at [facility] at 5:30 AM for check-in.  Have nothing to eat or drink after midnight the night before surgery.  On the morning of surgery do not take any medications

## 2018-03-07 ENCOUNTER — Encounter: Payer: Self-pay | Admitting: Surgery

## 2018-03-07 NOTE — Progress Notes (Signed)
Patient ID: Kaitlin Villarreal, female   DOB: 1926-04-22, 84 y.o.   MRN: [id number]  [facility] CONSULTATION REPORT  Referring Provider is Richardo Priest, MD PCP is Angelina Sheriff, MD  Chief Complaint  Patient presents with  . Aortic Stenosis    2nd TAVR consultation    HPI:  This patient is an 84 year old woman with a history of hypertension, stage III chronic kidney disease, hyperlipidemia, chronic back pain, and aortic stenosis.  She has been noted to have a heart murmur for many years and has been followed over the past several years by Dr. Bettina Gavia.  She has remained fairly active for her advanced age but has been slow due to her back pain.  She has recently developed exertional shortness of breath and fatigue.  An echocardiogram in March at [facility] reportedly showed severe aortic stenosis with a normal left ventricular ejection fraction of 70%.  The mean transvalvular gradient was reported at 42 mmHg.  Aortic valve area was 0.87 cm.  An exercise stress echocardiogram was performed on January 08, 2018 which confirmed the presence of severe aortic stenosis.  Peak velocity across aortic valve at rest was measured at 4.3 m/s with a mean transvalvular gradient of 40 mmHg.  Cardiac catheterization was performed on Feb 06, 2018 by Dr. Burt Knack and showed moderate nonobstructive coronary disease with normal right heart pressures.  The patient is here today with her husband who is in fairly good health and still active.  They have been married for almost 54 years.  Past Medical History:  Diagnosis Date  . Abnormal heart rhythm   . Acquired hammer toe of left foot   . Aortic stenosis   . Bone spur of toe of left foot   . Essential hypertension   . Heart murmur   . Midline low back pain without sciatica   . Nocturia   . Other hyperlipidemia   . Pancreatic mass    benign appearing but needs f/u  . 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 Polyuria   . Stage 3 chronic kidney disease [facility])     Past Surgical History:  Procedure Laterality Date  . ABDOMINAL HYSTERECTOMY    . APPENDECTOMY    . HERNIA REPAIR    . RIGHT/LEFT HEART CATH AND CORONARY ANGIOGRAPHY N/A 02/06/2018   Procedure: RIGHT/LEFT HEART CATH AND CORONARY ANGIOGRAPHY;  Surgeon: Sherren Mocha, MD;  Location: [facility];  Service: Cardiovascular;  Laterality: N/A;  . TONSILLECTOMY    . WISDOM TOOTH EXTRACTION      Family History  Problem Relation Age of Onset  . Heart disease Mother   . Uterine cancer Sister   . Congenital heart disease Brother   . Heart attack Brother     Social History   Socioeconomic History  . Marital status: Married    Spouse name: Not on file  . Number of children: Not on file  . Years of education: Not on file  . Highest education level: Not on file  Occupational History  . Not on file  Social Needs  . Financial resource strain: Not on file  . Food insecurity:    Worry: Not on file    Inability: Not on file  . Transportation needs:    Medical: Not on file    Non-medical: Not on file  Tobacco Use  . Smoking status: Never Smoker  . Smokeless  tobacco: Never Used  Substance and Sexual Activity  . Alcohol use: No  . Drug use: No  . Sexual activity: Not on file  Lifestyle  . Physical activity:    Days per week: Not on file    Minutes per session: Not on file  . Stress: Not on file  Relationships  . Social connections:    Talks on phone: Not on file    Gets together: Not on file    Attends religious service: Not on file    Active member of club or organization: Not on file    Attends meetings of clubs or organizations: Not on file    Relationship status: Not on file  . Intimate partner violence:    Fear of current or ex partner: Not on file    Emotionally abused: Not on file    Physically abused: Not on file    Forced sexual activity: Not on file  Other Topics Concern  .  Not on file  Social History Narrative  . Not on file    Current Outpatient Medications  Medication Sig Dispense Refill  . acetaminophen (TYLENOL) 650 MG CR tablet Take 650-1,300 mg by mouth every 8 (eight) hours as needed for pain.    Marland Kitchen amLODipine (NORVASC) 2.5 MG tablet Take 2.5 mg by mouth daily.     Marland Kitchen aspirin EC 81 MG tablet Take 81 mg by mouth daily.     Marland Kitchen estradiol (ESTRACE) 1 MG tablet Take 0.5 mg by mouth daily.    Marland Kitchen labetalol (NORMODYNE) 100 MG tablet Take 100 mg by mouth 2 (two) times daily.     . Omega-3 Fatty Acids (FISH OIL) 1000 MG CAPS Take 1,000 mg by mouth daily.    Vladimir Faster Glycol-Propyl Glycol (SYSTANE OP) Place 1 drop into both eyes daily as needed (for dry eyes).    . pravastatin (PRAVACHOL) 20 MG tablet Take 20 mg by mouth at bedtime.     . Probiotic Product ([employer] PROBIOTIC) CHEW Chew 2 each by mouth daily.    . ranitidine (ZANTAC) 300 MG tablet Take 300 mg by mouth at bedtime.     . Wheat Dextrin (BENEFIBER DRINK MIX PO) Take 1 packet by mouth daily.     No current facility-administered medications for this visit.     Allergies  Allergen Reactions  . Levofloxacin Other (See Comments)    Hallucinations  . Azithromycin Other (See Comments)    Unknown  . Clarithromycin Other (See Comments)    Felt like body was swollen, felt awful  . Latex Rash and Other (See Comments)    Causes sores      Review of Systems:   General:  normal appetite, decreased energy, no weight gain, no weight loss, no fever  Cardiac:  no chest pain with exertion, no chest pain at rest, + SOB with  exertion, no resting SOB, no PND, no orthopnea, no palpitations, no arrhythmia, no atrial fibrillation, + LE edema, + dizzy spells, no syncope  Respiratory:                + exertional shortness of breath, no home oxygen, no productive cough, no dry cough, no bronchitis, no wheezing, no hemoptysis, no asthma, no pain with inspiration or cough, no sleep apnea, no CPAP at night  GI:   +  difficulty swallowing, + reflux, no frequent heartburn, no hiatal hernia, no abdominal pain, no constipation, no diarrhea, no hematochezia, no hematemesis, no melena  GU:   no  dysuria,  + frequency, no urinary tract infection, no hematuria, no kidney stones, + kidney disease  Vascular:  no pain suggestive of claudication, no pain in feet, no leg cramps, no varicose veins, no DVT, no non-healing foot ulcer  Neuro:   no stroke, + TIA's, no seizures, no headaches, no temporary blindness one eye,  no slurred speech, no peripheral neuropathy, + chronic pain, + instability of gait, no memory/cognitive dysfunction  Musculoskeletal: + arthritis, no joint swelling, no myalgias, some difficulty walking, reduced mobility   Skin:   no rash, no itching, no skin infections, no pressure sores or ulcerations  Psych:   no anxiety, no depression, no nervousness, no unusual recent stress  Eyes:   no blurry vision, no floaters, no recent vision changes, + wears glasses   ENT:   no hearing loss, no loose or painful teeth, no dentures, last saw dentist last mongh  Hematologic:  + easy bruising, no abnormal bleeding, no clotting disorder, no frequent epistaxis  Endocrine:  no diabetes, does not check CBG's at home        Physical Exam:   BP (!) 152/60 (BP Location: Left Arm, Patient Position: Sitting, Cuff Size: Small)   Pulse 77   Resp 18   Ht 5' (1.524 m)   Wt 115 lb (52.2 kg)   SpO2 94% Comment: RA  BMI 22.46 kg/m   General                     Elderly, frail-appearing but mentally sharp  HEENT:  Unremarkable, NCAT, PERLA, EOMI, oropharynx clear, teeth in good conditon  Neck:   no JVD, no bruits, no adenopathy or thyromegaly  Chest:   clear to auscultation, symmetrical breath sounds, no wheezes, no rhonchi   CV:   RRR, grade III/VI crescendo/decrescendo murmur heard best at RSB,  no diastolic murmur  Abdomen:  soft, non-tender, no masses or organomegaly  Extremities:  warm, well-perfused, pulses diminished  but palpable in feet, no LE edema  Rectal/GU  Deferred  Neuro:   Grossly non-focal and symmetrical throughout  Skin:   Clean and dry, no rashes, no breakdown   Diagnostic Tests:                        [facility]*                       [place], [place] [address]                            [phone]  ------------------------------------------------------------------- Stress Echocardiography  Patient:    Francesca, Strome MR #:       [id number] Study Date: 01/08/2018 Gender:     F Age:        67 Height:     152.4 cm Weight:     54.5 kg BSA:        1.53 m^2 Pt. Status: Room:   ATTENDING    Default, Provider [phone]  Lake Cassidy, MD  Wallace, MD  REFERRING    Griffin Basil, [place] [place]  SONOGRAPHER  Cardell Peach, RDCS  cc:  -------------------------------------------------------------------  -------------------------------------------------------------------  Indications:      Aortic stenosis 424.1.  ------------------------------------------------------------------- History:   PMH:   Murmur.  Aortic valve disease.  Risk factors: Hypertension.  ------------------------------------------------------------------- Study Conclusions  - Aortic valve: Valve area (VTI): 0.7 cm^2. Valve area (Vmax): 0.76   cm^2. Valve area (Vmean): 0.76 cm^2. - Stress: There was a normal resting blood pressure with a   hypotensive response to stress. Functional capacity was decreased   (greater than 40%). - Stress ECG conclusions: There were no stress arrhythmias or   conduction abnormalities. The stress ECG was non-diagnostic.  ------------------------------------------------------------------- Labs, prior tests, procedures, and surgery: ECG.     Abnormal.  ------------------------------------------------------------------- Study data:   Study status:  Routine.   Consent:  The risks, benefits, and alternatives to the procedure were explained to the patient and informed consent was obtained.  Procedure:  Initial setup. The patient was brought to the laboratory. A baseline ECG was recorded. Surface ECG leads and automatic cuff blood pressure measurements were monitored. Treadmill exercise testing was performed using the modified Bruce protocol. The patient exercised for 4.5 min, to a maximal work rate of 3.4 mets. Exercise was terminated due to fatigue. Transthoracic stress echocardiography for assessment of valvular function. Image quality was adequate. Images were captured at baseline and peak exercise.  Study completion:  The patient tolerated the procedure well. There were no complications.          Modified Bruce protocol. Stress echocardiography.  Birthdate:  Patient birthdate: [birth year].  Age:  Patient is 84 yr old.  Sex:  Gender: female.    BMI: 23.5 kg/m^2. Blood pressure:     181/72  Patient status:  Outpatient.  Study date:  Study date: 01/08/2018. Study time: 11:35 AM.  -------------------------------------------------------------------  ------------------------------------------------------------------- Aortic valve:  Resting gradient 34mmHG.  Post stress: Peak stress velocity 376 cm/s. mean velocity 297cm/s. Mean gradient 39 mmHG.  Doppler:     VTI ratio of LVOT to aortic valve: 0.22. Valve area (VTI): 0.7 cm^2. Indexed valve area (VTI): 0.46 cm^2/m^2. Peak velocity ratio of LVOT to aortic valve: 0.24. Valve area (Vmax): 0.76 cm^2. Indexed valve area (Vmax): 0.5 cm^2/m^2. Mean velocity ratio of LVOT to aortic valve: 0.24. Valve area (Vmean): 0.76 cm^2. Indexed valve area (Vmean): 0.5 cm^2/m^2.   Resting mean gradient (S): 44 mm Hg. Peak gradient (S): 75 mm Hg.   ------------------------------------------------------------------- Mitral valve:   Doppler:     Peak gradient (D): 9 mm Hg.     ------------------------------------------------------------------- Baseline ECG:  Normal.  Normal sinus rhythm.  Nonspecific ST changes.  ------------------------------------------------------------------- Stress protocol:  +---------------------+---+------------+--------+ !Stage                !HR !BP (mmHg)   !Symptoms! +---------------------+---+------------+--------+ !Baseline             !70 !181/72 (108)!None    ! +---------------------+---+------------+--------+ !Stage 0              !105!------------!--------! +---------------------+---+------------+--------+ !Stage 1/2            !116!------------!--------! +---------------------+---+------------+--------+ !Immediate post stress!113!------------!--------! +---------------------+---+------------+--------+ !Recovery; 1 min      !88 !------------!--------! +---------------------+---+------------+--------+ !Recovery; 2 min      !82 !------------!--------! +---------------------+---+------------+--------+  ------------------------------------------------------------------- Stress results:   Maximal heart rate during stress was 116 bpm (89% of maximal predicted heart rate). The maximal predicted heart rate was 131 bpm.The target heart rate was achieved. The heart rate response to stress was normal. There was a normal resting blood pressure with a hypotensive response to stress. The rate-pressure  product for the peak heart rate and blood pressure was 12670 mm Hg/min.  The patient experienced no chest pain during stress. Functional capacity was decreased (greater than 40%).  ------------------------------------------------------------------- Stress ECG:  There were no stress arrhythmias or conduction abnormalities.  The stress ECG was non-diagnostic.  ------------------------------------------------------------------- Measurements   Left ventricle                            Value          Reference  LV ID, ED, PLAX  chordal           (L)     33.4  mm       43 - 52  LV ID, ES, PLAX chordal           (L)     17.9  mm       23 - 38  LV fx shortening, PLAX chordal            46    %        >=29  LV PW thickness, ED                       10.2  mm       ---------  IVS/LV PW ratio, ED               (H)     1.49           <=1.3  Stroke volume, 2D                         84    ml       ---------  Stroke volume/bsa, 2D                     55    ml/m^2   ---------    Ventricular septum                        Value          Reference  IVS thickness, ED                         15.2  mm       ---------    LVOT                                      Value          Reference  LVOT ID, S                                20    mm       ---------  LVOT area                                 3.14  cm^2     ---------  LVOT peak velocity, S                     105   cm/s     ---------  LVOT mean velocity, S  75.8  cm/s     ---------  LVOT VTI, S                               26.8  cm       ---------    Aortic valve                              Value          Reference  Aortic valve peak velocity, S             434   cm/s     ---------  Aortic valve mean velocity, S             312   cm/s     ---------  Aortic valve VTI, S                       121   cm       ---------  Aortic mean gradient, S                   40    mm Hg    ---------  Aortic peak gradient, S                   75    mm Hg    ---------  VTI ratio, LVOT/AV                        0.22           ---------  Aortic valve area, VTI                    0.7   cm^2     ---------  Aortic valve area/bsa, VTI                0.46  cm^2/m^2 ---------  Velocity ratio, peak, LVOT/AV             0.24           ---------  Aortic valve area, peak velocity          0.76  cm^2     ---------  Aortic valve area/bsa, peak               0.5   cm^2/m^2 ---------  velocity  Velocity ratio, mean, LVOT/AV             0.24           ---------  Aortic valve area, mean  velocity          0.76  cm^2     ---------  Aortic valve area/bsa, mean               0.5   cm^2/m^2 ---------  velocity    Aorta                                     Value          Reference  Aortic root ID, ED                        27    mm       ---------  Left atrium                               Value          Reference  LA ID, A-P, ES                            36    mm       ---------  LA ID/bsa, A-P                    (H)     2.36  cm/m^2   <=2.2    Mitral valve                              Value          Reference  Mitral E-wave peak velocity               147   cm/s     ---------  Mitral A-wave peak velocity               118   cm/s     ---------  Mitral deceleration time                  194   ms       150 - 230  Mitral peak gradient, D                   9     mm Hg    ---------  Mitral E/A ratio, peak                    1.2            ---------  Legend: (L)  and  (H)  mark values outside specified reference range.  ------------------------------------------------------------------- Prepared and Electronically Authenticated by  Shirlee More, MD 2019-04-10T13:52:39   Physicians   Panel Physicians Referring Physician Case Authorizing Physician  Sherren Mocha, MD (Primary)    Procedures   RIGHT/LEFT HEART CATH AND CORONARY ANGIOGRAPHY  Conclusion     Ost 1st Diag lesion is 50% stenosed.  Prox Cx lesion is 40% stenosed.  There is severe aortic valve stenosis.   1.  Calcified coronary arteries without significant stenosis.  Mild proximal left circumflex stenosis, minimal irregularity of the RCA, minimal irregularity of the LAD and moderate stenosis of an ostial diagonal 2.  Severely calcified aortic valve with known severe aortic stenosis by noninvasive assessment 3.  Normal right heart hemodynamics with preserved cardiac output and normal intracardiac filling pressures  Recommendations: Continue multidisciplinary evaluation for TAVR.  Medical therapy  for mild nonobstructive CAD.   Indications   Severe aortic stenosis [I35.0 (ICD-10-CM)]  Procedural Details/Technique   Technical Details INDICATION: Severe, Stage D1 Aortic Stenosis  PROCEDURAL DETAILS: There was an indwelling IV in a right antecubital vein. Using normal sterile technique, the IV was changed out for a 5 Fr brachial sheath over a 0.018 inch wire. The right wrist was then prepped, draped, and anesthetized with 1% lidocaine. Using the modified Seldinger technique a 5/6 French Slender sheath was placed in the right radial artery. Intra-arterial verapamil was administered through the radial artery sheath. IV heparin was administered after a JR4 catheter was advanced into the central aorta. A Swan-Ganz catheter was used for  the right heart catheterization. Standard protocol was followed for recording of right heart pressures and sampling of oxygen saturations. Fick cardiac output was calculated. Standard Judkins catheters were used for selective coronary angiography. Attempts are not made to cross the aortic valve. There were no immediate procedural complications. The patient was transferred to the post catheterization recovery area for further monitoring.     Estimated blood loss <50 mL.  During this procedure the patient was administered the following to achieve and maintain moderate conscious sedation: Versed 1 mg, while the patient's heart rate, blood pressure, and oxygen saturation were continuously monitored. The period of conscious sedation was 34 minutes, of which I was present face-to-face 100% of this time.  Coronary Findings   Diagnostic  Dominance: Right  Left Main  There is mild diffuse disease throughout the vessel. The vessel is moderately calcified. The left main is calcified with no significant stenosis  Left Anterior Descending  The vessel exhibits minimal luminal irregularities. The LAD is patent throughout. The distal vessel has multiple angulated segments but  there are no stenoses. The origin of the first diagonal branch has 50% stenosis.  First Diagonal Branch  Ost 1st Diag lesion 50% stenosed  Ost 1st Diag lesion is 50% stenosed.  Left Circumflex  There is mild diffuse disease throughout the vessel.  Prox Cx lesion 40% stenosed  Prox Cx lesion is 40% stenosed. The lesion is moderately calcified.  First Obtuse Marginal Branch  Vessel is moderate in size. There is mild disease in the vessel.  Second Obtuse Marginal Branch  There is mild disease in the vessel.  Right Coronary Artery  Vessel is moderate in size. There is mild diffuse disease throughout the vessel. The origin of the RCA is calcified around the right cusp. The vessel has moderate diffuse calcification. The vessel is widely patent with minor diffuse irregularities noted but no significant stenosis.  Intervention   No interventions have been documented.  Left Heart   Aortic Valve There is severe aortic valve stenosis. The aortic valve is calcified. There is restricted aortic valve motion.  Coronary Diagrams   Diagnostic Diagram       Implants    No implant documentation for this case.  MERGE Images   Show images for CARDIAC CATHETERIZATION   Link to Procedure Log   Procedure Log    Hemo Data    Most Recent Value  Fick Cardiac Output 6.11 L/min  Fick Cardiac Output Index 4.16 (L/min)/BSA  RA A Wave 6 mmHg  RA V Wave 4 mmHg  RA Mean 2 mmHg  RV Systolic Pressure 38 mmHg  RV Diastolic Pressure 0 mmHg  RV EDP 6 mmHg  PA Systolic Pressure 38 mmHg  PA Diastolic Pressure 12 mmHg  PA Mean 25 mmHg  PW A Wave 13 mmHg  PW V Wave 17 mmHg  PW Mean 11 mmHg  AO Systolic Pressure 062 mmHg  AO Diastolic Pressure 61 mmHg  AO Mean 100 mmHg  QP/QS 1  TPVR Index 6.01 HRUI  TSVR Index 24.05 HRUI  PVR SVR Ratio 0.14  TPVR/TSVR Ratio 0.25   ADDENDUM REPORT: 02/20/2018 18:29  CLINICAL DATA:  84 year old female with severe aortic stenosis being evaluated for a TAVR  procedure.  EXAM: Cardiac TAVR CT  TECHNIQUE: The patient was scanned on a [employer]. A 120 kV retrospective scan was triggered in the descending thoracic aorta at 111 HU's. Gantry rotation speed was 250 msecs and collimation was .6 mm. No beta blockade or  nitro were given. The 3D data set was reconstructed in 5% intervals of the R-R cycle. Systolic and diastolic phases were analyzed on a dedicated work station using MPR, MIP and VRT modes. The patient received 80 cc of contrast.  FINDINGS: Aortic Valve: Trileaflet, severely thickened, moderately calcified aortic valve with mild calcifications extending asymmetrically into the LVOT under the right and left coronary cusps.  Aorta: Normal size with moderate diffuse calcifications and atherosclerosis, no dissection.  Sinotubular Junction: 24 x 24 mm  Ascending Thoracic Aorta: 29 x 29 mm  Aortic Arch: 23 x 23 mm  Descending Thoracic Aorta: 19 x 19 mm  Sinus of Valsalva Measurements:  Non-coronary: 31 mm  Right -coronary: 30 mm  Left -coronary: 29 mm  Sinus of Valsalva Height:  Right -coronary: 22 mm  Left -coronary: 21 mm  Coronary Artery Height above Annulus:  Left Main: 17 mm  Right Coronary: 18 mm  Virtual Basal Annulus Measurements:  Maximum/Minimum Diameter: 22.7 x 18.5 mm  Mean Diameter: 20.4 mm  Perimeter: 65.1 mm  Area: 326 mm2  Optimum Fluoroscopic Angle for Delivery: LAO 24 CAU 20  IMPRESSION: 1. Trileaflet, severely thickened, moderately calcified aortic valve with mild calcifications extending asymmetrically into the LVOT under the right and left coronary cusps. Annular measurements suitable for delivery of a 20 mm Edwards-SAPIEN 3 valve or a 26 mm Medtronic Evolut R valve.  2. Sufficient coronary to annulus distance.  3. Optimum Fluoroscopic Angle for Delivery: LAO 24 CAU 20  4. No thrombus in the left atrial appendage.   Electronically  Signed   By: Ena Dawley   On: 02/20/2018 18:29   Addended by Dorothy Spark, MD on 02/20/2018 6:32 PM    Study Result   EXAM: OVER-READ INTERPRETATION  CT CHEST  The following report is an over-read performed by radiologist Dr. Vinnie Langton of [facility], [place] on 02/20/2018. This over-read does not include interpretation of cardiac or coronary anatomy or pathology. The coronary calcium score/coronary CTA interpretation by the cardiologist is attached.  COMPARISON:  None.  FINDINGS: Extracardiac findings will be described under separate dictation for contemporaneously obtained CTA chest, abdomen and pelvis.  IMPRESSION: Please see separate dictation for contemporaneously obtained CTA chest, abdomen and pelvis dated 02/20/2018 for full description of relevant extracardiac findings.  Electronically Signed: By: Vinnie Langton M.D. On: 02/20/2018 15:14      CLINICAL DATA:  84 year old female with history of severe symptomatic aortic stenosis. Preprocedural study prior to potential transcatheter aortic valve replacement (TAVR) procedure.  EXAM: CT ANGIOGRAPHY CHEST, ABDOMEN AND PELVIS  TECHNIQUE: Multidetector CT imaging through the chest, abdomen and pelvis was performed using the standard protocol during bolus administration of intravenous contrast. Multiplanar reconstructed images and MIPs were obtained and reviewed to evaluate the vascular anatomy.  CONTRAST:  152mL ISOVUE-370 IOPAMIDOL (ISOVUE-370) INJECTION 76%  COMPARISON:  No prior chest CT. CT the abdomen and pelvis 01/05/2005.  FINDINGS: CTA CHEST FINDINGS  Cardiovascular: Heart size is mildly enlarged. There is no significant pericardial fluid, thickening or pericardial calcification. There is aortic atherosclerosis, as well as atherosclerosis of the great vessels of the mediastinum and the coronary arteries, including calcified atherosclerotic plaque in the left  main, left anterior descending, left circumflex and right coronary arteries. Calcifications of the aortic valve.  Mediastinum/Lymph Nodes: No pathologically enlarged mediastinal or hilar lymph nodes. Esophagus is unremarkable in appearance. No axillary lymphadenopathy.  Lungs/Pleura: No suspicious appearing pulmonary nodules or masses. No acute consolidative airspace disease. No pleural effusions.  Musculoskeletal/Soft Tissues: There are no aggressive appearing lytic or blastic lesions noted in the visualized portions of the skeleton.  CTA ABDOMEN AND PELVIS FINDINGS  Hepatobiliary: No suspicious cystic or solid hepatic lesions. No intra or extrahepatic biliary ductal dilatation. Gallbladder is normal in appearance.  Pancreas: In the distal body and proximal tail of the pancreas there is a well-defined 1.5 x 1.0 x 2.1 cm low-attenuation lesion (axial image 108 of series 6, and coronal image 42 of series 8)) which previously measured 6 mm on prior study from 01/05/2005. This lesion has no mural nodularity or definite internal soft tissue, and does not appear to communicate with the main pancreatic duct. No other suspicious appearing pancreatic mass. No pancreatic ductal dilatation. No peripancreatic fluid collections or inflammatory changes.  Spleen: Unremarkable.  Adrenals/Urinary Tract: Low-attenuation lesions in both kidneys, compatible with simple cysts, the largest of which is in the upper pole of the left kidney measuring 1.6 cm in diameter. No hydroureteronephrosis. Urinary bladder is normal in appearance. Bilateral adrenal glands are normal in appearance.  Stomach/Bowel: Normal appearance of the stomach. No pathologic dilatation of small bowel or colon. Numerous colonic diverticulae are noted, particularly in the sigmoid colon, without surrounding inflammatory changes to suggest an acute diverticulitis at this time.  Vascular/Lymphatic: Aortic  atherosclerosis, without evidence of aneurysm or dissection in the abdominal or pelvic vasculature. Relatively large noncalcified atheromatous plaque in the proximal abdominal aorta at the level of the aortic hiatus (axial image 85 of series 6). Vascular findings and measurements pertinent to potential TAVR procedure, as detailed below. Celiac axis, superior mesenteric artery and inferior mesenteric artery are all patent without hemodynamically significant stenosis. Single renal arteries are both patent without hemodynamically significant stenosis. No lymphadenopathy noted in the abdomen or pelvis.  Reproductive: Status post hysterectomy. Ovaries are not confidently identified may be surgically absent or atrophic.  Other: No significant volume of ascites.  No pneumoperitoneum.  Musculoskeletal: There are no aggressive appearing lytic or blastic lesions noted in the visualized portions of the skeleton.  VASCULAR MEASUREMENTS PERTINENT TO TAVR:  AORTA:  Minimal Aortic Diameter-11 x 12 mm  Severity of Aortic Calcification-moderate  RIGHT PELVIS:  Right Common Iliac Artery -  Minimal Diameter-8.4 x 7.1 mm  Tortuosity-mild  Calcification-mild  Right External Iliac Artery -  Minimal Diameter-6.1 x 6.4 mm  Tortuosity-moderate  Calcification-none  Right Common Femoral Artery -  Minimal Diameter-6.2 x 6.5 mm  Tortuosity-mild  Calcification-none  LEFT PELVIS:  Left Common Iliac Artery -  Minimal Diameter-8.3 x 7.0 mm  Tortuosity-mild  Calcification-mild  Left External Iliac Artery -  Minimal Diameter-6.0 x 5.4 mm  Tortuosity-moderate  Calcification-none  Left Common Femoral Artery -  Minimal Diameter-5.7 x 5.7 mm  Tortuosity-mild  Calcification-none  Review of the MIP images confirms the above findings.  IMPRESSION: 1. Vascular findings and measurements pertinent to potential TAVR procedure, as detailed  above. 2. Thickening calcification of the aortic valve, compatible with the reported clinical history of severe aortic stenosis. 3. Aortic atherosclerosis, in addition to left main and 3 vessel coronary artery disease. In addition, there is a relatively large noncalcified atheromatous plaque in the aorta at the level of the aortic hiatus, which protrudes into the lumen and could have implications for during catheter manipulation at time of TAVR procedure. 4. 1.5 x 1.0 x 2.1 cm cystic lesion at the junction of distal body and tail of the pancreas. This lesion has grown slightly when compared to remote prior study from 2006, but is  favored to be benign. Repeat pancreatic protocol CT scan or abdominal MRI with and without IV gadolinium with MRCP is recommended in 2 years to ensure continued stability. This recommendation follows ACR consensus guidelines: Management of Incidental Pancreatic Cysts: A White Paper of the ACR Incidental Findings Committee. [place] 0175;10:258-527. 5. Additional incidental findings, as above.  Aortic Atherosclerosis (ICD10-I70.0).   Electronically Signed   By: Vinnie Langton M.D.   On: 02/21/2018 13:08   Impression:  This 84 year old woman has stage D, severe, symptomatic aortic stenosis with New York Heart Association class II-III symptoms of exertional fatigue and shortness of breath consistent with chronic diastolic congestive heart failure.  I have personally reviewed her echocardiogram report from [facility], stress echocardiogram done here, cardiac catheterization, and CTA studies.  Her echocardiogram showed severe aortic stenosis with a trileaflet aortic valve that is heavily calcified and thickened with reduced leaflet mobility.  The mean gradient was 42 mmHg with normal left ventricular systolic function.  The stress echocardiogram confirmed severe aortic stenosis with a resting mean aortic valve gradient of 44 mmHg.  Cardiac  catheterization shows no significant coronary disease with normal right heart pressures.  I agree that aortic valve replacement is indicated in this patient for improvement of her symptoms and to prevent progressive left ventricular deterioration.  Her operative risk with open surgical aortic valve replacement would be at least moderately elevated given her advanced age and frailty.  I think the best option for her is transcatheter aortic valve replacement.  Her gated cardiac CTA shows anatomy favorable for transcatheter aortic valve replacement using a 23 mm Sapien 3 transcatheter heart valve without any complicating features.  She does have a relatively small aortic root.  CTA of the abdomen and pelvis shows adequate pelvic vascular access for a transfemoral insertion.  There is some mural plaque in the suprarenal portion of the abdominal aorta but since she has a small woman the insertion sheath should reach above this.  The patient and her husband were counseled at length regarding treatment alternatives for management of severe symptomatic aortic stenosis. The risks and benefits of surgical intervention have been discussed in detail. Long-term prognosis with medical therapy was discussed. Alternative approaches such as conventional surgical aortic valve replacement, transcatheter aortic valve replacement, and palliative medical therapy were compared and contrasted at length. This discussion was placed in the context of the patient's own specific clinical presentation and past medical history. All of their questions have been addressed.   Following the decision to proceed with transcatheter aortic valve replacement, a discussion was held regarding what types of management strategies would be attempted intraoperatively in the event of life-threatening complications, including whether or not the patient would be considered a candidate for the use of cardiopulmonary bypass and/or conversion to open sternotomy  for attempted surgical intervention. The patient is aware of the fact that transient use of cardiopulmonary bypass may be necessary, but the patient specifically states that she would not wish to undergo redo median sternotomy under any circumstances, even if she were to develop potentially lethal complications related to transcatheter valves appointment.  The patient has been advised of a variety of complications that might develop including but not limited to risks of death, stroke, paravalvular leak, aortic dissection or other major vascular complications, aortic annulus rupture, device embolization, cardiac rupture or perforation, mitral regurgitation, acute myocardial infarction, arrhythmia, heart block or bradycardia requiring permanent pacemaker placement, congestive heart failure, respiratory failure, renal failure, pneumonia, infection, other late  complications related to structural valve deterioration or migration, or other complications that might ultimately cause a temporary or permanent loss of functional independence or other long term morbidity. The patient provides full informed consent for the procedure as described and all questions were answered.    Plan:  Transfemoral transcatheter aortic valve replacement on 03/18/2018.  I spent 45 minutes performing this consultation and > 50% of this time was spent face to face counseling and coordinating the care of this patient's severe symptomatic aortic stenosis.  Gaye Pollack, MD 03/06/2018

## 2018-03-12 ENCOUNTER — Ambulatory Visit: Payer: Medicare Other | Admitting: Cardiology

## 2018-03-13 NOTE — Pre-Procedure Instructions (Signed)
Kaitlin Villarreal  03/13/2018      [id number] - [place], [place] - [address] [place] [address] Phone: [phone] Fax: [phone]    Your procedure is scheduled on 03-18-2018  Tuesday   Report to [facility] Admitting at 5:30 A.M.   Call this number if you have problems the morning of surgery:  [phone]   Remember:  Do not eat or drink after midnight.   :                        Take these medicines the morning of surgery with A SIP OF WATER No medications the morning of surgery  STOP TAKING ANY ASPIRIN(UNLESS OTHERWISE INSTRUCTED BY YOUR SURGEON),ANTIINFLAMATORIES (IBUPROFEN,ALEVE,MOTRIN,ADVIL,GOODY'S POWDERS),HERBAL SUPPLEMENTS,FISH OIL,AND VITAMINS 5-7 DAYS PRIOR TO SURGERY    Do not wear jewelry, make-up or nail polish.  Do not wear lotions, powders, or perfumes, or deodorant.  Do not shave 48 hours prior to surgery.      Do not bring valuables to the hospital.  [facility] is not responsible for any belongings or valuables.  Contacts, dentures or bridgework may not be worn into surgery.  Leave your suitcase in the car.  After surgery it may be brought to your room.  For patients admitted to the hospital, discharge time will be determined by your treatment team.  Patients discharged the day of surgery will not be allowed to drive home.    [place] - Preparing for Surgery  Before surgery, you can play an important role.  Because skin is not sterile, your skin needs to be as free of germs as possible.  You can reduce the number of germs on you skin by washing with CHG (chlorahexidine gluconate) soap before surgery.  CHG is an antiseptic cleaner which kills germs and bonds with the skin to continue killing germs even after washing.  Oral Hygiene is also important in reducing the risk of infection.  Remember to brush your teeth with your regular toothpaste the morning of surgery.  Please DO NOT use  if you have an allergy to CHG or antibacterial soaps.  If your skin becomes reddened/irritated stop using the CHG and inform your nurse when you arrive at Short Stay.  Do not shave (including legs and underarms) for at least 48 hours prior to the first CHG shower.  You may shave your face.  Please follow these instructions carefully:   1.  Shower with CHG Soap the night before surgery and the morning of Surgery.  2.  If you choose to wash your hair, wash your hair first as usual with your normal shampoo.  3.  After you shampoo, rinse your hair and body thoroughly to remove the shampoo. 4.  Use CHG as you would any other liquid soap.  You can apply chg directly to the skin and wash gently with a      scrungie or washcloth.           5.  Apply the CHG Soap to your body ONLY FROM THE NECK DOWN.   Do not use on open wounds or open sores. Avoid contact with your eyes, ears, mouth and genitals (private parts).  Wash genitals (private parts) with your normal soap.  6.  Wash thoroughly, paying special attention to the area where your surgery will be performed.  7.  Thoroughly rinse your body with warm water from  the neck down.  8.  DO NOT shower/wash with your normal soap after using and rinsing off the CHG Soap.  9.  Pat yourself dry with a clean towel.            10.  Wear clean pajamas.            11.  Place clean sheets on your bed the night of your first shower and do not sleep with pets.  Day of Surgery  Do not apply any lotions/deoderants the morning of surgery.   Please wear clean clothes to the hospital/surgery center. Remember to brush your teeth with toothpaste.     Please read over the following fact sheets that you were given. Coughing and Deep Breathing, MRSA Information and Surgical Site Infection Prevention

## 2018-03-14 ENCOUNTER — Encounter (HOSPITAL_COMMUNITY)
Admission: RE | Admit: 2018-03-14 | Discharge: 2018-03-14 | Disposition: A | Discharge status: Home / Self Care | Payer: Medicare Other | Source: Ambulatory Visit | Attending: Cardiovascular Disease | Admitting: Cardiovascular Disease

## 2018-03-14 ENCOUNTER — Other Ambulatory Visit: Payer: Self-pay

## 2018-03-14 ENCOUNTER — Encounter (HOSPITAL_COMMUNITY): Payer: Self-pay | Admitting: *Deleted

## 2018-03-14 ENCOUNTER — Ambulatory Visit (HOSPITAL_COMMUNITY)
Admission: RE | Admit: 2018-03-14 | Discharge: 2018-03-14 | Disposition: A | Discharge status: Home / Self Care | Payer: Medicare Other | Source: Ambulatory Visit | Attending: Cardiovascular Disease | Admitting: Cardiovascular Disease

## 2018-03-14 DIAGNOSIS — R0602 Shortness of breath: Secondary | ICD-10-CM | POA: Diagnosis not present

## 2018-03-14 DIAGNOSIS — I350 Nonrheumatic aortic (valve) stenosis: Secondary | ICD-10-CM | POA: Diagnosis not present

## 2018-03-14 DIAGNOSIS — I700 Atherosclerosis of aorta: Secondary | ICD-10-CM | POA: Insufficient documentation

## 2018-03-14 DIAGNOSIS — R9431 Abnormal electrocardiogram [ECG] [EKG]: Secondary | ICD-10-CM | POA: Insufficient documentation

## 2018-03-14 HISTORY — DX: Gastro-esophageal reflux disease without esophagitis: K21.9

## 2018-03-14 HISTORY — DX: Unspecified osteoarthritis, unspecified site: M19.90

## 2018-03-14 LAB — URINALYSIS, ROUTINE W REFLEX MICROSCOPIC
BILIRUBIN URINE: NEGATIVE
Glucose, UA: NEGATIVE mg/dL
HGB URINE DIPSTICK: NEGATIVE
Ketones, ur: NEGATIVE mg/dL
Leukocytes, UA: NEGATIVE
Nitrite: NEGATIVE
PH: 5 (ref 5.0–8.0)
Protein, ur: NEGATIVE mg/dL
SPECIFIC GRAVITY, URINE: 1.017 (ref 1.005–1.030)

## 2018-03-14 LAB — BLOOD GAS, ARTERIAL
Acid-Base Excess: 0.7 mmol/L (ref 0.0–2.0)
BICARBONATE: 24.7 mmol/L (ref 20.0–28.0)
Drawn by: 44982
FIO2: 0.21
O2 SAT: 90.1 %
PATIENT TEMPERATURE: 98.6
PO2 ART: 57.5 mmHg — AB (ref 83.0–108.0)
pCO2 arterial: 38.9 mmHg (ref 32.0–48.0)
pH, Arterial: 7.42 (ref 7.350–7.450)

## 2018-03-14 LAB — SURGICAL PCR SCREEN
MRSA, PCR: NEGATIVE
Staphylococcus aureus: NEGATIVE

## 2018-03-14 LAB — TYPE AND SCREEN
ABO/RH(D): O POS
ANTIBODY SCREEN: NEGATIVE

## 2018-03-14 LAB — COMPREHENSIVE METABOLIC PANEL
ALT: 15 U/L (ref 14–54)
AST: 24 U/L (ref 15–41)
Albumin: 4.1 g/dL (ref 3.5–5.0)
Alkaline Phosphatase: 48 U/L (ref 38–126)
Anion gap: 7 (ref 5–15)
BUN: 33 mg/dL — ABNORMAL HIGH (ref 6–20)
CHLORIDE: 106 mmol/L (ref 101–111)
CO2: 26 mmol/L (ref 22–32)
CREATININE: 1.33 mg/dL — AB (ref 0.44–1.00)
Calcium: 9 mg/dL (ref 8.9–10.3)
GFR, EST AFRICAN AMERICAN: 40 mL/min — AB (ref >60)
GFR, EST NON AFRICAN AMERICAN: 34 mL/min — AB (ref >60)
Glucose, Bld: 91 mg/dL (ref 65–99)
POTASSIUM: 4.4 mmol/L (ref 3.5–5.1)
SODIUM: 139 mmol/L (ref 135–145)
Total Bilirubin: 0.6 mg/dL (ref 0.3–1.2)
Total Protein: 7.2 g/dL (ref 6.5–8.1)

## 2018-03-14 LAB — HEMOGLOBIN A1C
HEMOGLOBIN A1C: 5.8 % — AB (ref 4.8–5.6)
MEAN PLASMA GLUCOSE: 119.76 mg/dL

## 2018-03-14 LAB — CBC
HCT: 37.8 % (ref 36.0–46.0)
Hemoglobin: 11.6 g/dL — ABNORMAL LOW (ref 12.0–15.0)
MCH: 29.1 pg (ref 26.0–34.0)
MCHC: 30.7 g/dL (ref 30.0–36.0)
MCV: 94.7 fL (ref 78.0–100.0)
PLATELETS: 245 10*3/uL (ref 150–400)
RBC: 3.99 MIL/uL (ref 3.87–5.11)
RDW: 13.3 % (ref 11.5–15.5)
WBC: 5.1 10*3/uL (ref 4.0–10.5)

## 2018-03-14 LAB — ABO/RH: ABO/RH(D): O POS

## 2018-03-14 LAB — PROTIME-INR
INR: 0.94
PROTHROMBIN TIME: 12.5 s (ref 11.4–15.2)

## 2018-03-14 LAB — BRAIN NATRIURETIC PEPTIDE: B Natriuretic Peptide: 174 pg/mL — ABNORMAL HIGH (ref 0.0–100.0)

## 2018-03-14 LAB — APTT: APTT: 39 s — AB (ref 24–36)

## 2018-03-14 NOTE — Progress Notes (Signed)
PCP  Lovette Cliche II  Cardiac cath 02-06-2018  ECHO  12-23-2017

## 2018-03-17 MED ORDER — SODIUM CHLORIDE 0.9 % IV SOLN
INTRAVENOUS | Status: DC
  Filled 2018-03-17: qty 30

## 2018-03-17 MED ORDER — MAGNESIUM SULFATE 50 % IJ SOLN
40.0000 meq | INTRAMUSCULAR | Status: DC
Start: 2018-03-18 — End: 2018-03-18
  Filled 2018-03-17: qty 9.85

## 2018-03-17 MED ORDER — NOREPINEPHRINE 4 MG/250ML-% IV SOLN
0.0000 ug/min | INTRAVENOUS | Status: DC
  Filled 2018-03-17: qty 250

## 2018-03-17 MED ORDER — NITROGLYCERIN IN D5W 200-5 MCG/ML-% IV SOLN
2.0000 ug/min | INTRAVENOUS | Status: DC
  Filled 2018-03-17: qty 250

## 2018-03-17 MED ORDER — DEXMEDETOMIDINE HCL IN NACL 400 MCG/100ML IV SOLN
0.1000 ug/kg/h | INTRAVENOUS | Status: AC
  Administered 2018-03-18: 1 ug/kg/h via INTRAVENOUS
  Filled 2018-03-17: qty 100

## 2018-03-17 MED ORDER — SODIUM CHLORIDE 0.9 % IV SOLN
INTRAVENOUS | Status: DC
  Filled 2018-03-17: qty 1

## 2018-03-17 MED ORDER — CEFUROXIME SODIUM 1.5 G IV SOLR
1.5000 g | INTRAVENOUS | Status: AC
  Administered 2018-03-18: 1.5 g via INTRAVENOUS
  Filled 2018-03-17: qty 1.5

## 2018-03-17 MED ORDER — POTASSIUM CHLORIDE 2 MEQ/ML IV SOLN
80.0000 meq | INTRAVENOUS | Status: DC
  Filled 2018-03-17: qty 40

## 2018-03-17 MED ORDER — SODIUM CHLORIDE 0.9 % IV SOLN
30.0000 ug/min | INTRAVENOUS | Status: DC
  Filled 2018-03-17: qty 2

## 2018-03-17 MED ORDER — DOPAMINE-DEXTROSE 3.2-5 MG/ML-% IV SOLN
0.0000 ug/kg/min | INTRAVENOUS | Status: DC
  Filled 2018-03-17: qty 250

## 2018-03-17 MED ORDER — EPINEPHRINE PF 1 MG/ML IJ SOLN
0.0000 ug/min | INTRAVENOUS | Status: DC
  Filled 2018-03-17: qty 4

## 2018-03-17 MED ORDER — VANCOMYCIN HCL 10 G IV SOLR
1250.0000 mg | INTRAVENOUS | Status: AC
  Administered 2018-03-18: 1250 mg via INTRAVENOUS
  Filled 2018-03-17: qty 1250

## 2018-03-17 NOTE — Anesthesia Preprocedure Evaluation (Addendum)
Anesthesia Evaluation  Patient identified by MRN, date of birth, ID band Patient awake    Reviewed: Allergy & Precautions, H&P , NPO status , Patient's Chart, lab work & pertinent test results  Airway Mallampati: II  TM Distance: >3 FB Neck ROM: Full    Dental no notable dental hx. (+) Teeth Intact, Dental Advisory Given   Pulmonary neg pulmonary ROS,    Pulmonary exam normal breath sounds clear to auscultation       Cardiovascular Exercise Tolerance: Good hypertension, Pt. on medications and Pt. on home beta blockers + Valvular Problems/Murmurs AS  Rhythm:Regular Rate:Normal + Systolic murmurs    Neuro/Psych CVA, No Residual Symptoms negative psych ROS   GI/Hepatic Neg liver ROS, GERD  Medicated and Controlled,  Endo/Other  negative endocrine ROS  Renal/GU Renal InsufficiencyRenal disease  negative genitourinary   Musculoskeletal  (+) Arthritis ,   Abdominal   Peds  Hematology negative hematology ROS (+)   Anesthesia Other Findings   Reproductive/Obstetrics negative OB ROS                            Anesthesia Physical Anesthesia Plan  ASA: IV  Anesthesia Plan: MAC   Post-op Pain Management:    Induction: Intravenous  PONV Risk Score and Plan: 3 and Propofol infusion and Treatment may vary due to age or medical condition  Airway Management Planned: Simple Face Mask  Additional Equipment: Arterial line, CVP and Ultrasound Guidance Line Placement  Intra-op Plan:   Post-operative Plan:   Informed Consent: I have reviewed the patients History and Physical, chart, labs and discussed the procedure including the risks, benefits and alternatives for the proposed anesthesia with the patient or authorized representative who has indicated his/her understanding and acceptance.   Dental advisory given  Plan Discussed with: CRNA  Anesthesia Plan Comments:        Anesthesia Quick  Evaluation

## 2018-03-18 ENCOUNTER — Other Ambulatory Visit: Payer: Self-pay

## 2018-03-18 ENCOUNTER — Inpatient Hospital Stay (HOSPITAL_COMMUNITY): Payer: Medicare Other

## 2018-03-18 ENCOUNTER — Inpatient Hospital Stay (HOSPITAL_COMMUNITY): Payer: Medicare Other | Admitting: Emergency Medicine

## 2018-03-18 ENCOUNTER — Encounter (HOSPITAL_COMMUNITY)
Admission: RE | Disposition: A | Discharge status: Home Health | Payer: Self-pay | Source: Home / Self Care | Attending: Thoracic Surgery (Cardiothoracic Vascular Surgery)

## 2018-03-18 ENCOUNTER — Encounter (HOSPITAL_COMMUNITY): Payer: Self-pay | Admitting: *Deleted

## 2018-03-18 ENCOUNTER — Inpatient Hospital Stay (HOSPITAL_COMMUNITY): Payer: Medicare Other | Admitting: Certified Registered Nurse Anesthetist

## 2018-03-18 ENCOUNTER — Inpatient Hospital Stay (HOSPITAL_COMMUNITY)
Admission: RE | Admit: 2018-03-18 | Discharge: 2018-03-24 | DRG: 266 | Disposition: A | Discharge status: Home Health | Payer: Medicare Other | Attending: Thoracic Surgery (Cardiothoracic Vascular Surgery) | Admitting: Thoracic Surgery (Cardiothoracic Vascular Surgery)

## 2018-03-18 DIAGNOSIS — I5033 Acute on chronic diastolic (congestive) heart failure: Secondary | ICD-10-CM | POA: Diagnosis not present

## 2018-03-18 DIAGNOSIS — Z883 Allergy status to other anti-infective agents status: Secondary | ICD-10-CM

## 2018-03-18 DIAGNOSIS — Z8249 Family history of ischemic heart disease and other diseases of the circulatory system: Secondary | ICD-10-CM

## 2018-03-18 DIAGNOSIS — Z954 Presence of other heart-valve replacement: Secondary | ICD-10-CM | POA: Diagnosis not present

## 2018-03-18 DIAGNOSIS — D696 Thrombocytopenia, unspecified: Secondary | ICD-10-CM | POA: Diagnosis not present

## 2018-03-18 DIAGNOSIS — N183 Chronic kidney disease, stage 3 unspecified: Secondary | ICD-10-CM | POA: Diagnosis present

## 2018-03-18 DIAGNOSIS — Z91040 Latex allergy status: Secondary | ICD-10-CM | POA: Diagnosis not present

## 2018-03-18 DIAGNOSIS — Z79899 Other long term (current) drug therapy: Secondary | ICD-10-CM | POA: Diagnosis not present

## 2018-03-18 DIAGNOSIS — Z95818 Presence of other cardiac implants and grafts: Secondary | ICD-10-CM

## 2018-03-18 DIAGNOSIS — Z8673 Personal history of transient ischemic attack (TIA), and cerebral infarction without residual deficits: Secondary | ICD-10-CM | POA: Diagnosis not present

## 2018-03-18 DIAGNOSIS — Z006 Encounter for examination for normal comparison and control in clinical research program: Secondary | ICD-10-CM

## 2018-03-18 DIAGNOSIS — Y831 Surgical operation with implant of artificial internal device as the cause of abnormal reaction of the patient, or of later complication, without mention of misadventure at the time of the procedure: Secondary | ICD-10-CM | POA: Diagnosis not present

## 2018-03-18 DIAGNOSIS — K8689 Other specified diseases of pancreas: Secondary | ICD-10-CM | POA: Diagnosis present

## 2018-03-18 DIAGNOSIS — R918 Other nonspecific abnormal finding of lung field: Secondary | ICD-10-CM | POA: Diagnosis not present

## 2018-03-18 DIAGNOSIS — Q210 Ventricular septal defect: Secondary | ICD-10-CM | POA: Diagnosis not present

## 2018-03-18 DIAGNOSIS — I350 Nonrheumatic aortic (valve) stenosis: Secondary | ICD-10-CM | POA: Diagnosis not present

## 2018-03-18 DIAGNOSIS — Z952 Presence of prosthetic heart valve: Secondary | ICD-10-CM | POA: Diagnosis not present

## 2018-03-18 DIAGNOSIS — I129 Hypertensive chronic kidney disease with stage 1 through stage 4 chronic kidney disease, or unspecified chronic kidney disease: Secondary | ICD-10-CM | POA: Diagnosis not present

## 2018-03-18 DIAGNOSIS — I130 Hypertensive heart and chronic kidney disease with heart failure and stage 1 through stage 4 chronic kidney disease, or unspecified chronic kidney disease: Secondary | ICD-10-CM | POA: Diagnosis present

## 2018-03-18 DIAGNOSIS — I510 Cardiac septal defect, acquired: Secondary | ICD-10-CM | POA: Diagnosis not present

## 2018-03-18 DIAGNOSIS — E7849 Other hyperlipidemia: Secondary | ICD-10-CM | POA: Diagnosis present

## 2018-03-18 DIAGNOSIS — Z881 Allergy status to other antibiotic agents status: Secondary | ICD-10-CM

## 2018-03-18 DIAGNOSIS — E785 Hyperlipidemia, unspecified: Secondary | ICD-10-CM | POA: Diagnosis present

## 2018-03-18 DIAGNOSIS — I97638 Postprocedural hematoma of a circulatory system organ or structure following other circulatory system procedure: Secondary | ICD-10-CM | POA: Diagnosis not present

## 2018-03-18 DIAGNOSIS — I442 Atrioventricular block, complete: Secondary | ICD-10-CM | POA: Diagnosis not present

## 2018-03-18 DIAGNOSIS — D62 Acute posthemorrhagic anemia: Secondary | ICD-10-CM | POA: Diagnosis not present

## 2018-03-18 DIAGNOSIS — K869 Disease of pancreas, unspecified: Secondary | ICD-10-CM | POA: Diagnosis present

## 2018-03-18 DIAGNOSIS — Z7982 Long term (current) use of aspirin: Secondary | ICD-10-CM

## 2018-03-18 DIAGNOSIS — K219 Gastro-esophageal reflux disease without esophagitis: Secondary | ICD-10-CM | POA: Diagnosis present

## 2018-03-18 DIAGNOSIS — Z452 Encounter for adjustment and management of vascular access device: Secondary | ICD-10-CM | POA: Diagnosis not present

## 2018-03-18 DIAGNOSIS — I5032 Chronic diastolic (congestive) heart failure: Secondary | ICD-10-CM | POA: Diagnosis not present

## 2018-03-18 DIAGNOSIS — Z79890 Hormone replacement therapy (postmenopausal): Secondary | ICD-10-CM

## 2018-03-18 DIAGNOSIS — I2510 Atherosclerotic heart disease of native coronary artery without angina pectoris: Secondary | ICD-10-CM | POA: Diagnosis present

## 2018-03-18 DIAGNOSIS — I10 Essential (primary) hypertension: Secondary | ICD-10-CM | POA: Diagnosis present

## 2018-03-18 DIAGNOSIS — I340 Nonrheumatic mitral (valve) insufficiency: Secondary | ICD-10-CM | POA: Diagnosis not present

## 2018-03-18 HISTORY — DX: Acute on chronic diastolic (congestive) heart failure: I50.33

## 2018-03-18 HISTORY — PX: TRANSCATHETER AORTIC VALVE REPLACEMENT, TRANSFEMORAL: SHX6400

## 2018-03-18 HISTORY — DX: Chronic diastolic (congestive) heart failure: I50.32

## 2018-03-18 HISTORY — DX: Presence of prosthetic heart valve: Z95.2

## 2018-03-18 HISTORY — DX: Transient cerebral ischemic attack, unspecified: G45.9

## 2018-03-18 HISTORY — DX: Hyperlipidemia, unspecified: E78.5

## 2018-03-18 HISTORY — PX: INTRAOPERATIVE TRANSTHORACIC ECHOCARDIOGRAM: SHX6523

## 2018-03-18 HISTORY — DX: Nonrheumatic aortic (valve) stenosis: I35.0

## 2018-03-18 LAB — POCT I-STAT, CHEM 8
BUN: 28 mg/dL — AB (ref 6–20)
BUN: 26 mg/dL — ABNORMAL HIGH (ref 6–20)
BUN: 27 mg/dL — ABNORMAL HIGH (ref 6–20)
BUN: 47 mg/dL — AB (ref 6–20)
BUN: 39 mg/dL — ABNORMAL HIGH (ref 6–20)
BUN: 38 mg/dL — ABNORMAL HIGH (ref 6–20)
CALCIUM ION: 1.24 mmol/L (ref 1.15–1.40)
CALCIUM ION: 1.28 mmol/L (ref 1.15–1.40)
CALCIUM ION: 1.27 mmol/L (ref 1.15–1.40)
CHLORIDE: 111 mmol/L (ref 101–111)
CREATININE: 1.1 mg/dL — AB (ref 0.44–1.00)
CREATININE: 0.9 mg/dL (ref 0.44–1.00)
CREATININE: 2.8 mg/dL — AB (ref 0.44–1.00)
Calcium, Ion: 1.25 mmol/L (ref 1.15–1.40)
Calcium, Ion: 1.2 mmol/L (ref 1.15–1.40)
Calcium, Ion: 1.29 mmol/L (ref 1.15–1.40)
Chloride: 106 mmol/L (ref 101–111)
Chloride: 107 mmol/L (ref 101–111)
Chloride: 106 mmol/L (ref 101–111)
Chloride: 111 mmol/L (ref 101–111)
Chloride: 112 mmol/L — ABNORMAL HIGH (ref 101–111)
Creatinine, Ser: 1 mg/dL (ref 0.44–1.00)
Creatinine, Ser: 3.1 mg/dL — ABNORMAL HIGH (ref 0.44–1.00)
Creatinine, Ser: 2.8 mg/dL — ABNORMAL HIGH (ref 0.44–1.00)
GLUCOSE: 126 mg/dL — AB (ref 65–99)
GLUCOSE: 151 mg/dL — AB (ref 65–99)
GLUCOSE: 109 mg/dL — AB (ref 65–99)
GLUCOSE: 115 mg/dL — AB (ref 65–99)
Glucose, Bld: 102 mg/dL — ABNORMAL HIGH (ref 65–99)
Glucose, Bld: 107 mg/dL — ABNORMAL HIGH (ref 65–99)
HCT: 30 % — ABNORMAL LOW (ref 36.0–46.0)
HCT: 30 % — ABNORMAL LOW (ref 36.0–46.0)
HCT: 21 % — ABNORMAL LOW (ref 36.0–46.0)
HEMATOCRIT: 32 % — AB (ref 36.0–46.0)
HEMATOCRIT: 25 % — AB (ref 36.0–46.0)
HEMATOCRIT: 22 % — AB (ref 36.0–46.0)
HEMOGLOBIN: 10.9 g/dL — AB (ref 12.0–15.0)
HEMOGLOBIN: 10.2 g/dL — AB (ref 12.0–15.0)
HEMOGLOBIN: 8.5 g/dL — AB (ref 12.0–15.0)
HEMOGLOBIN: 7.1 g/dL — AB (ref 12.0–15.0)
Hemoglobin: 10.2 g/dL — ABNORMAL LOW (ref 12.0–15.0)
Hemoglobin: 7.5 g/dL — ABNORMAL LOW (ref 12.0–15.0)
POTASSIUM: 3.9 mmol/L (ref 3.5–5.1)
POTASSIUM: 4.5 mmol/L (ref 3.5–5.1)
POTASSIUM: 4.9 mmol/L (ref 3.5–5.1)
Potassium: 3.9 mmol/L (ref 3.5–5.1)
Potassium: 4.1 mmol/L (ref 3.5–5.1)
Potassium: 4.9 mmol/L (ref 3.5–5.1)
SODIUM: 142 mmol/L (ref 135–145)
SODIUM: 144 mmol/L (ref 135–145)
SODIUM: 144 mmol/L (ref 135–145)
Sodium: 142 mmol/L (ref 135–145)
Sodium: 143 mmol/L (ref 135–145)
Sodium: 144 mmol/L (ref 135–145)
TCO2: 25 mmol/L (ref 22–32)
TCO2: 25 mmol/L (ref 22–32)
TCO2: 24 mmol/L (ref 22–32)
TCO2: 22 mmol/L (ref 22–32)
TCO2: 21 mmol/L — AB (ref 22–32)
TCO2: 20 mmol/L — ABNORMAL LOW (ref 22–32)

## 2018-03-18 LAB — PROTIME-INR
INR: 1.05
Prothrombin Time: 13.6 seconds (ref 11.4–15.2)

## 2018-03-18 LAB — POCT I-STAT 4, (NA,K, GLUC, HGB,HCT)
GLUCOSE: 127 mg/dL — AB (ref 65–99)
HEMATOCRIT: 28 % — AB (ref 36.0–46.0)
Hemoglobin: 9.5 g/dL — ABNORMAL LOW (ref 12.0–15.0)
Potassium: 3.6 mmol/L (ref 3.5–5.1)
Sodium: 143 mmol/L (ref 135–145)

## 2018-03-18 LAB — APTT: APTT: 49 s — AB (ref 24–36)

## 2018-03-18 LAB — ECHOCARDIOGRAM LIMITED
HEIGHTINCHES: 60 in
Weight: 1920 oz

## 2018-03-18 SURGERY — IMPLANTATION, AORTIC VALVE, TRANSCATHETER, FEMORAL APPROACH
Anesthesia: Monitor Anesthesia Care | Site: Chest

## 2018-03-18 MED ORDER — CHLORHEXIDINE GLUCONATE 4 % EX LIQD: 60.0000 mL | Freq: Once | CUTANEOUS | Status: DC

## 2018-03-18 MED ORDER — ACETAMINOPHEN 500 MG PO TABS
1000.0000 mg | ORAL_TABLET | Freq: Four times a day (QID) | ORAL | Status: AC
  Administered 2018-03-19 – 2018-03-23 (×14): 1000 mg via ORAL
  Filled 2018-03-18 (×15): qty 2

## 2018-03-18 MED ORDER — CLEVIDIPINE BUTYRATE 0.5 MG/ML IV EMUL
0.0000 mg/h | INTRAVENOUS | Status: DC
  Administered 2018-03-18: 2 mg/h via INTRAVENOUS
  Filled 2018-03-18: qty 50

## 2018-03-18 MED ORDER — FENTANYL CITRATE (PF) 100 MCG/2ML IJ SOLN
INTRAMUSCULAR | Status: DC | PRN
  Administered 2018-03-18 (×2): 50 ug via INTRAVENOUS

## 2018-03-18 MED ORDER — SODIUM CHLORIDE 0.9 % IV SOLN
0.0000 ug/min | INTRAVENOUS | Status: DC
  Administered 2018-03-18: 20 ug/min via INTRAVENOUS
  Filled 2018-03-18: qty 20
  Filled 2018-03-18: qty 2

## 2018-03-18 MED ORDER — EPHEDRINE SULFATE 50 MG/ML IJ SOLN
INTRAMUSCULAR | Status: AC
  Filled 2018-03-18: qty 1

## 2018-03-18 MED ORDER — SODIUM CHLORIDE 0.9 % IV SOLN
1.5000 g | Freq: Two times a day (BID) | INTRAVENOUS | Status: DC
  Filled 2018-03-18: qty 1.5

## 2018-03-18 MED ORDER — WHITE PETROLATUM EX OINT
TOPICAL_OINTMENT | CUTANEOUS | Status: DC | PRN
  Filled 2018-03-18: qty 28.35

## 2018-03-18 MED ORDER — CHLORHEXIDINE GLUCONATE 0.12 % MT SOLN
15.0000 mL | Freq: Once | OROMUCOSAL | Status: AC
  Administered 2018-03-18: 15 mL via OROMUCOSAL

## 2018-03-18 MED ORDER — LIDOCAINE 2% (20 MG/ML) 5 ML SYRINGE
INTRAMUSCULAR | Status: AC
  Filled 2018-03-18: qty 5

## 2018-03-18 MED ORDER — LIDOCAINE HCL 1 % IJ SOLN
INTRAMUSCULAR | Status: DC | PRN
  Administered 2018-03-18: 10 mL

## 2018-03-18 MED ORDER — ASPIRIN 81 MG PO CHEW
81.0000 mg | CHEWABLE_TABLET | Freq: Every day | ORAL | Status: DC
  Administered 2018-03-21: 81 mg
  Filled 2018-03-18: qty 1

## 2018-03-18 MED ORDER — MIDAZOLAM HCL 2 MG/2ML IJ SOLN: 2.0000 mg | INTRAMUSCULAR | Status: DC | PRN

## 2018-03-18 MED ORDER — NITROGLYCERIN IN D5W 200-5 MCG/ML-% IV SOLN
0.0000 ug/min | INTRAVENOUS | Status: DC
  Administered 2018-03-18: 5 ug/min via INTRAVENOUS
  Filled 2018-03-18: qty 250

## 2018-03-18 MED ORDER — AMLODIPINE BESYLATE 5 MG PO TABS
2.5000 mg | ORAL_TABLET | Freq: Every day | ORAL | Status: DC
  Administered 2018-03-18 – 2018-03-24 (×7): 2.5 mg via ORAL
  Filled 2018-03-18 (×7): qty 1

## 2018-03-18 MED ORDER — SODIUM CHLORIDE 0.9 % IV SOLN
INTRAVENOUS | Status: DC
  Administered 2018-03-18 – 2018-03-21 (×5): via INTRAVENOUS

## 2018-03-18 MED ORDER — HEPARIN SODIUM (PORCINE) 1000 UNIT/ML IJ SOLN
INTRAMUSCULAR | Status: DC | PRN
  Administered 2018-03-18 (×2): 5000 [IU] via INTRAVENOUS

## 2018-03-18 MED ORDER — SODIUM CHLORIDE 0.9 % IV SOLN: INTRAVENOUS | Status: DC

## 2018-03-18 MED ORDER — LACTATED RINGERS IV SOLN: 500.0000 mL | Freq: Once | INTRAVENOUS | Status: DC | PRN

## 2018-03-18 MED ORDER — ASPIRIN EC 81 MG PO TBEC
81.0000 mg | DELAYED_RELEASE_TABLET | Freq: Every day | ORAL | Status: DC
  Administered 2018-03-19 – 2018-03-24 (×5): 81 mg via ORAL
  Filled 2018-03-18 (×6): qty 1

## 2018-03-18 MED ORDER — IODIXANOL 320 MG/ML IV SOLN
INTRAVENOUS | Status: DC | PRN
  Administered 2018-03-18: 35.2 mL via INTRAVENOUS

## 2018-03-18 MED ORDER — MIDAZOLAM HCL 2 MG/2ML IJ SOLN
INTRAMUSCULAR | Status: AC
  Filled 2018-03-18: qty 2

## 2018-03-18 MED ORDER — ESTRADIOL 1 MG PO TABS
0.5000 mg | ORAL_TABLET | Freq: Every day | ORAL | Status: DC
  Administered 2018-03-18 – 2018-03-24 (×7): 0.5 mg via ORAL
  Filled 2018-03-18 (×7): qty 0.5

## 2018-03-18 MED ORDER — LACTATED RINGERS IV SOLN
INTRAVENOUS | Status: DC | PRN
  Administered 2018-03-18: 07:00:00 via INTRAVENOUS

## 2018-03-18 MED ORDER — OMEGA-3-ACID ETHYL ESTERS 1 G PO CAPS
1000.0000 mg | ORAL_CAPSULE | Freq: Every day | ORAL | Status: DC
  Administered 2018-03-19 – 2018-03-24 (×5): 1000 mg via ORAL
  Filled 2018-03-18 (×6): qty 1

## 2018-03-18 MED ORDER — PRAVASTATIN SODIUM 20 MG PO TABS
20.0000 mg | ORAL_TABLET | Freq: Every day | ORAL | Status: DC
Start: 2018-03-18 — End: 2018-03-24
  Administered 2018-03-18 – 2018-03-23 (×6): 20 mg via ORAL
  Filled 2018-03-18 (×6): qty 1

## 2018-03-18 MED ORDER — VANCOMYCIN HCL IN DEXTROSE 1-5 GM/200ML-% IV SOLN: 1000.0000 mg | Freq: Once | INTRAVENOUS | Status: DC

## 2018-03-18 MED ORDER — PROPOFOL 500 MG/50ML IV EMUL
INTRAVENOUS | Status: DC | PRN
  Administered 2018-03-18: 10 ug/kg/min via INTRAVENOUS

## 2018-03-18 MED ORDER — TRAMADOL HCL 50 MG PO TABS
50.0000 mg | ORAL_TABLET | Freq: Two times a day (BID) | ORAL | Status: DC | PRN
  Administered 2018-03-18 – 2018-03-19 (×2): 50 mg via ORAL
  Administered 2018-03-20 – 2018-03-21 (×2): 100 mg via ORAL
  Filled 2018-03-18 (×2): qty 1
  Filled 2018-03-18 (×2): qty 2

## 2018-03-18 MED ORDER — CHLORHEXIDINE GLUCONATE 4 % EX LIQD: 30.0000 mL | CUTANEOUS | Status: DC

## 2018-03-18 MED ORDER — LIDOCAINE HCL (PF) 1 % IJ SOLN
INTRAMUSCULAR | Status: AC
  Filled 2018-03-18: qty 30

## 2018-03-18 MED ORDER — ACETAMINOPHEN 160 MG/5ML PO SOLN
1000.0000 mg | Freq: Four times a day (QID) | ORAL | Status: AC
  Administered 2018-03-22: 1000 mg
  Filled 2018-03-18: qty 40.6

## 2018-03-18 MED ORDER — MORPHINE SULFATE (PF) 2 MG/ML IV SOLN: 2.0000 mg | INTRAVENOUS | Status: DC | PRN

## 2018-03-18 MED ORDER — FAMOTIDINE 20 MG PO TABS
20.0000 mg | ORAL_TABLET | Freq: Every day | ORAL | Status: DC
  Administered 2018-03-19 – 2018-03-24 (×6): 20 mg via ORAL
  Filled 2018-03-18 (×6): qty 1

## 2018-03-18 MED ORDER — PROTAMINE SULFATE 10 MG/ML IV SOLN
INTRAVENOUS | Status: DC | PRN
  Administered 2018-03-18: 10 mg via INTRAVENOUS
  Administered 2018-03-18: 90 mg via INTRAVENOUS

## 2018-03-18 MED ORDER — ONDANSETRON HCL 4 MG/2ML IJ SOLN: 4.0000 mg | Freq: Four times a day (QID) | INTRAMUSCULAR | Status: DC | PRN

## 2018-03-18 MED ORDER — FAMOTIDINE 40 MG PO TABS
40.0000 mg | ORAL_TABLET | Freq: Every day | ORAL | Status: DC
  Administered 2018-03-18: 40 mg via ORAL
  Filled 2018-03-18: qty 1

## 2018-03-18 MED ORDER — SODIUM CHLORIDE 0.9 % IV SOLN
INTRAVENOUS | Status: DC | PRN
  Administered 2018-03-18: 1500 mL

## 2018-03-18 MED ORDER — SODIUM CHLORIDE 0.9 % IV SOLN
1.5000 g | INTRAVENOUS | Status: AC
  Administered 2018-03-19 – 2018-03-20 (×2): 1.5 g via INTRAVENOUS
  Filled 2018-03-18 (×2): qty 1.5

## 2018-03-18 MED ORDER — ROCURONIUM BROMIDE 50 MG/5ML IV SOLN
INTRAVENOUS | Status: AC
  Filled 2018-03-18: qty 1

## 2018-03-18 MED ORDER — PROPOFOL 10 MG/ML IV BOLUS
INTRAVENOUS | Status: AC
  Filled 2018-03-18: qty 20

## 2018-03-18 MED ORDER — FENTANYL CITRATE (PF) 250 MCG/5ML IJ SOLN
INTRAMUSCULAR | Status: AC
  Filled 2018-03-18: qty 5

## 2018-03-18 MED ORDER — POLYVINYL ALCOHOL 1.4 % OP SOLN
Freq: Every day | OPHTHALMIC | Status: DC | PRN
  Filled 2018-03-18: qty 15

## 2018-03-18 MED ORDER — ALBUMIN HUMAN 5 % IV SOLN: 250.0000 mL | INTRAVENOUS | Status: AC | PRN

## 2018-03-18 MED ORDER — METOPROLOL TARTRATE 5 MG/5ML IV SOLN: 2.5000 mg | INTRAVENOUS | Status: DC | PRN

## 2018-03-18 MED ORDER — CLOPIDOGREL BISULFATE 75 MG PO TABS
75.0000 mg | ORAL_TABLET | Freq: Every day | ORAL | Status: DC
  Administered 2018-03-19: 75 mg via ORAL
  Filled 2018-03-18: qty 1

## 2018-03-18 MED ORDER — SODIUM CHLORIDE 0.9 % IV SOLN
INTRAVENOUS | Status: AC
  Filled 2018-03-18 (×3): qty 1.2

## 2018-03-18 MED ORDER — MIDAZOLAM HCL 5 MG/5ML IJ SOLN
INTRAMUSCULAR | Status: DC | PRN
  Administered 2018-03-18: 1 mg via INTRAVENOUS

## 2018-03-18 MED ORDER — OXYCODONE HCL 5 MG PO TABS: 5.0000 mg | ORAL_TABLET | ORAL | Status: DC | PRN

## 2018-03-18 SURGICAL SUPPLY — 66 items
BAG DECANTER FOR FLEXI CONT (MISCELLANEOUS) ×4 IMPLANT
BAG SNAP BAND KOVER 36X36 (MISCELLANEOUS) ×4 IMPLANT
BLADE CLIPPER SURG (BLADE) IMPLANT
BLADE STERNUM SYSTEM 6 (BLADE) IMPLANT
CABLE ADAPT CONN TEMP 6FT (ADAPTER) ×4 IMPLANT
CANISTER SUCT 3000ML PPV (MISCELLANEOUS) ×4 IMPLANT
CATH DIAG EXPO 6F VENT PIG 145 (CATHETERS) ×8 IMPLANT
CATH EXPO 5FR AL1 (CATHETERS) ×4 IMPLANT
CATH S G BIP PACING (SET/KITS/TRAYS/PACK) ×4 IMPLANT
CONT SPEC 4OZ CLIKSEAL STRL BL (MISCELLANEOUS) ×8 IMPLANT
COVER BACK TABLE 80X110 HD (DRAPES) ×4 IMPLANT
COVER DOME SNAP 22 D (MISCELLANEOUS) IMPLANT
CRADLE DONUT ADULT HEAD (MISCELLANEOUS) ×4 IMPLANT
DERMABOND ADHESIVE PROPEN (GAUZE/BANDAGES/DRESSINGS) ×2
DERMABOND ADVANCED (GAUZE/BANDAGES/DRESSINGS) ×2
DERMABOND ADVANCED .7 DNX12 (GAUZE/BANDAGES/DRESSINGS) ×2 IMPLANT
DERMABOND ADVANCED .7 DNX6 (GAUZE/BANDAGES/DRESSINGS) ×2 IMPLANT
DEVICE CLOSURE PERCLS PRGLD 6F (VASCULAR PRODUCTS) ×4 IMPLANT
DRSG TEGADERM 4X4.75 (GAUZE/BANDAGES/DRESSINGS) ×8 IMPLANT
ELECT REM PT RETURN 9FT ADLT (ELECTROSURGICAL) ×8
ELECTRODE REM PT RTRN 9FT ADLT (ELECTROSURGICAL) ×4 IMPLANT
GAUZE SPONGE 4X4 12PLY STRL (GAUZE/BANDAGES/DRESSINGS) ×4 IMPLANT
GLOVE BIO SURGEON STRL SZ7.5 (GLOVE) IMPLANT
GLOVE BIO SURGEON STRL SZ8 (GLOVE) IMPLANT
GLOVE EUDERMIC 7 POWDERFREE (GLOVE) IMPLANT
GLOVE ORTHO TXT STRL SZ7.5 (GLOVE) IMPLANT
GLOVE SURG SS PI 7.0 STRL IVOR (GLOVE) ×4 IMPLANT
GOWN STRL REUS W/ TWL LRG LVL3 (GOWN DISPOSABLE) ×12 IMPLANT
GOWN STRL REUS W/ TWL XL LVL3 (GOWN DISPOSABLE) ×2 IMPLANT
GOWN STRL REUS W/TWL LRG LVL3 (GOWN DISPOSABLE) ×12
GOWN STRL REUS W/TWL XL LVL3 (GOWN DISPOSABLE) ×2
GUIDEWIRE SAF TJ AMPL .035X180 (WIRE) ×4 IMPLANT
GUIDEWIRE SAFE TJ AMPLATZ EXST (WIRE) ×4 IMPLANT
GUIDEWIRE STRAIGHT .035 260CM (WIRE) ×4 IMPLANT
KIT BASIN OR (CUSTOM PROCEDURE TRAY) ×4 IMPLANT
KIT HEART LEFT (KITS) ×4 IMPLANT
KIT TURNOVER KIT B (KITS) ×4 IMPLANT
LOOP VESSEL MINI RED (MISCELLANEOUS) IMPLANT
NEEDLE PERC 18GX7CM (NEEDLE) ×4 IMPLANT
NS IRRIG 1000ML POUR BTL (IV SOLUTION) ×12 IMPLANT
PACK ENDOVASCULAR (PACKS) ×4 IMPLANT
PAD ARMBOARD 7.5X6 YLW CONV (MISCELLANEOUS) ×8 IMPLANT
PAD ELECT DEFIB RADIOL ZOLL (MISCELLANEOUS) ×4 IMPLANT
PENCIL BUTTON HOLSTER BLD 10FT (ELECTRODE) ×4 IMPLANT
PERCLOSE PROGLIDE 6F (VASCULAR PRODUCTS) ×8
SET MICROPUNCTURE 5F STIFF (MISCELLANEOUS) ×4 IMPLANT
SHEATH BRITE TIP 6FR 35CM (SHEATH) ×4 IMPLANT
SHEATH PINNACLE 6F 10CM (SHEATH) IMPLANT
SHEATH PINNACLE 8F 10CM (SHEATH) ×4 IMPLANT
SLEEVE REPOSITIONING LENGTH 30 (MISCELLANEOUS) ×4 IMPLANT
SPONGE LAP 4X18 RFD (DISPOSABLE) ×4 IMPLANT
STOPCOCK MORSE 400PSI 3WAY (MISCELLANEOUS) ×8 IMPLANT
SUT SILK  1 MH (SUTURE) ×2
SUT SILK 1 MH (SUTURE) ×2 IMPLANT
SUT SILK 2 0 PERMA HAND 18 BK (SUTURE) ×4 IMPLANT
SUT SILK 2 0 SH (SUTURE) ×4 IMPLANT
SYR 50ML LL SCALE MARK (SYRINGE) ×4 IMPLANT
SYR CONTROL 10ML LL (SYRINGE) ×4 IMPLANT
TAPE CLOTH SURG 4X10 WHT LF (GAUZE/BANDAGES/DRESSINGS) ×4 IMPLANT
TAPE HY-TAPE 1X5Y PINK NS LF (GAUZE/BANDAGES/DRESSINGS) ×4 IMPLANT
TOWEL GREEN STERILE (TOWEL DISPOSABLE) ×8 IMPLANT
TRANSDUCER W/STOPCOCK (MISCELLANEOUS) ×8 IMPLANT
TRAY FOLEY SLVR 14FR TEMP STAT (SET/KITS/TRAYS/PACK) IMPLANT
VALVE HEART TRANSCATH SZ3 23MM (Prosthesis & Implant Heart) ×4 IMPLANT
WIRE .035 3MM-J 145CM (WIRE) ×4 IMPLANT
WIRE BENTSON .035X145CM (WIRE) ×4 IMPLANT

## 2018-03-18 NOTE — Anesthesia Procedure Notes (Signed)
Procedure Name: MAC Date/Time: 03/18/2018 7:30 AM Performed by: Leonor Liv, CRNA Pre-anesthesia Checklist: Patient identified, Emergency Drugs available, Suction available, Patient being monitored and Timeout performed Patient Re-evaluated:Patient Re-evaluated prior to induction Oxygen Delivery Method: Simple face mask Placement Confirmation: positive ETCO2

## 2018-03-18 NOTE — Transfer of Care (Signed)
Immediate Anesthesia Transfer of Care Note  Patient: Kaitlin Villarreal  Procedure(s) Performed: TRANSCATHETER AORTIC VALVE REPLACEMENT, TRANSFEMORAL (N/A Chest) INTRAOPERATIVE TRANSTHORACIC ECHOCARDIOGRAM (N/A )  Patient Location: SICU  Anesthesia Type:MAC  Level of Consciousness: awake, alert , oriented and patient cooperative  Airway & Oxygen Therapy: Patient Spontanous Breathing and Patient connected to nasal cannula oxygen  Post-op Assessment: Report given to RN and Post -op Vital signs reviewed and stable  Post vital signs: Reviewed and stable  Last Vitals:  Vitals Value Taken Time  BP 108/54 03/18/2018 10:30 AM  Temp    Pulse    Resp 27 03/18/2018 10:31 AM  SpO2 93 % 03/18/2018 10:31 AM  Vitals shown include unvalidated device data.  Last Pain:  Vitals:   03/18/18 0615  TempSrc: Oral  PainSc:       Patients Stated Pain Goal: 3 ([id number])  Complications: No apparent anesthesia complications

## 2018-03-18 NOTE — Progress Notes (Signed)
6 french sheath removed from left femoral artery and pressure held x 20 minutes.  Site looks good with no hematoma.  Vitals stable throughout sheath removal.  Dressed with 4x4 and tegaderm.  Distal pulse palpable left DP.  RN will continue to monitor site.  Bedrest will continue until venous sheath and temp pacer has been addressed.

## 2018-03-18 NOTE — Op Note (Addendum)
HEART AND VASCULAR CENTER   MULTIDISCIPLINARY HEART VALVE TEAM   TAVR OPERATIVE NOTE   Date of Procedure:  03/18/2018  Preoperative Diagnosis: Severe Aortic Stenosis   Postoperative Diagnosis: Same   Procedure:    Transcatheter Aortic Valve Replacement - Percutaneous Right Transfemoral Approach  Edwards Sapien 3 THV (size 23 mm, model # 9600TFX, serial # [id number])   Co-Surgeons:  Valentina Gu. Roxy Manns, MD and Sherren Mocha, MD  Anesthesiologist:  Arabella Merles, MD  Echocardiographer:  Ena Dawley, MD  Pre-operative Echo Findings:  Severe aortic stenosis  Normal left ventricular systolic function  Post-operative Echo Findings:  No paravalvular leak  Very small perimembranous ventricular septal defect  No pericardial effusion  Normal left ventricular systolic function   BRIEF CLINICAL NOTE AND INDICATIONS FOR SURGERY  Patient is an 84 year old female with history of aortic stenosis, hypertension, chronic back pain, and stage III chronic kidney disease who has been referred for surgical consultation to discuss treatment options for management of severe symptomatic aortic stenosis.  The patient states that she was first noted to have a heart murmur on routine physical examination by her gynecologist nearly 20 years ago.  She was eventually referred for cardiology evaluation and has been followed for several years by Dr. Bettina Gavia.  She has remained reasonably active physically despite her advanced age, although she has slowed down considerably over the last few years.  She has developed exertional shortness of breath and fatigue, some of which the patient has blamed on decreased activity because of problems with her back.  She underwent a routine transthoracic echocardiogram on December 23, 2017 at [facility].  By report this echocardiogram revealed severe aortic stenosis with normal left ventricular systolic function.  Ejection fraction was estimated greater than  70%.  Peak velocity across aortic valve was reported 4.1 m/s with peak and mean transvalvular gradients estimated 69 and 42 mmHg respectively.  Aortic valve area was calculated 0.87 cm.  Exercise stress echocardiogram was performed January 08, 2018.  This confirmed the presence of severe aortic stenosis associated with hypertensive response to stress.  Peak velocity across aortic valve at rest was measured 4.3 m/s corresponding to mean transvalvular gradient estimated 40 mmHg.  The DVI was 0.24 with aortic valve area calculated 0.76 cm.  The patient was referred to Dr. Burt Knack and underwent diagnostic cardiac catheterization Feb 06, 2018.  She was found to have moderate nonobstructive coronary artery disease with normal right heart hemodynamics and preserved cardiac output.  CT angiography was performed and the patient was referred for surgical consultation.  During the course of the patient's preoperative work up they have been evaluated comprehensively by a multidisciplinary team of specialists coordinated through the [facility] in the [place] [facility].  They have been demonstrated to suffer from symptomatic severe aortic stenosis as noted above. The patient has been counseled extensively as to the relative risks and benefits of all options for the treatment of severe aortic stenosis including long term medical therapy, conventional surgery for aortic valve replacement, and transcatheter aortic valve replacement.  All questions have been answered, and the patient provides full informed consent for the operation as described.   DETAILS OF THE OPERATIVE PROCEDURE  PREPARATION:    The patient is brought to the operating room on the above mentioned date and central monitoring was established by the anesthesia team including placement of a central venous line and radial arterial line. The patient is placed in the supine position  on the operating table.  Intravenous  antibiotics are administered. The patient is monitored closely throughout the procedure under conscious sedation.  Baseline transthoracic echocardiogram was performed. The patient's chest, abdomen, both groins, and both lower extremities are prepared and draped in a sterile manner. A time out procedure is performed.   PERIPHERAL ACCESS:    Using the modified Seldinger technique, femoral arterial and venous access was obtained with placement of 6 Fr sheaths on the left side.  A pigtail diagnostic catheter was passed through the left arterial sheath under fluoroscopic guidance into the aortic root.  A temporary transvenous pacemaker catheter was passed through the left femoral venous sheath under fluoroscopic guidance into the right ventricle.  The pacemaker was tested to ensure stable lead placement and pacemaker capture. Aortic root angiography was performed in order to determine the optimal angiographic angle for valve deployment.   TRANSFEMORAL ACCESS:   Percutaneous transfemoral access and sheath placement was performed by Dr. Burt Knack using ultrasound guidance.  The right common femoral artery was cannulated using a micropuncture needle and appropriate location was verified using hand injection angiogram.  A pair of Abbott Perclose percutaneous closure devices were placed and a 6 French sheath replaced into the femoral artery.  The patient was heparinized systemically and ACT verified > 250 seconds.    A 14 Fr transfemoral E-sheath was introduced into the right common femoral artery after progressively dilating over an Amplatz superstiff wire. An AL-1 catheter was used to direct a straight-tip exchange length wire across the native aortic valve into the left ventricle. This was exchanged out for a pigtail catheter and position was confirmed in the LV apex. Simultaneous LV and Ao pressures were recorded.  The pigtail catheter was exchanged for an Amplatz Extra-stiff wire in the LV apex.   Echocardiography was utilized to confirm appropriate wire position and no sign of entanglement in the mitral subvalvular apparatus.   TRANSCATHETER HEART VALVE DEPLOYMENT:   An Edwards Sapien 3 transcatheter heart valve (size 23 mm, model #9600TFX, serial #[id number]) was prepared and crimped per manufacturer's guidelines, and the proper orientation of the valve is confirmed on the [employer] delivery system. The valve was advanced through the introducer sheath using normal technique until in an appropriate position in the abdominal aorta beyond the sheath tip. The balloon was then retracted and using the fine-tuning wheel was centered on the valve. The valve was then advanced across the aortic arch using appropriate flexion of the catheter. The valve was carefully positioned across the aortic valve annulus. The Commander catheter was retracted using normal technique. Once final position of the valve has been confirmed by angiographic assessment, the valve is deployed while temporarily holding ventilation and during rapid ventricular pacing to maintain systolic blood pressure < 50 mmHg and pulse pressure < 10 mmHg. The balloon inflation is held for >3 seconds after reaching full deployment volume. Once the balloon has fully deflated the balloon is retracted into the ascending aorta and valve function is assessed using echocardiography. There is felt to be no paravalvular leak and no central aortic insufficiency.  There appears to be a very small perimembranous ventricular septal defect seen best in short axis parasternal transthoracic view.  The patient's hemodynamic recovery following valve deployment is good although ventricular pacing is required due to new onset complete heart block.  The deployment balloon and guidewire are both removed.   TEE is performed to confirm the very small perimembranous VSD.  There is no pericardial effusion and no other  signs of annular rupture.   PROCEDURE COMPLETION:    The sheath was removed and femoral artery closure performed by Dr Burt Knack.  Protamine was administered once femoral arterial repair was complete. The pigtail catheters and femoral arterial sheath was removed with manual pressure used for hemostasis.  The temporary pacemaker wire was left in place.  The patient tolerated the procedure well and is transported to the surgical intensive care in stable condition. There were no immediate intraoperative complications. All sponge instrument and needle counts are verified correct at completion of the operation.   No blood products were administered during the operation.   Rexene Alberts, MD 03/18/2018 10:04 AM

## 2018-03-18 NOTE — Progress Notes (Signed)
HEART AND VASCULAR CENTER   MULTIDISCIPLINARY HEART VALVE TEAM   TAVR OPERATIVE NOTE   Date of Procedure:  03/18/2018  Preoperative Diagnosis: Severe Aortic Stenosis   Postoperative Diagnosis: Same   Procedure:    Transcatheter Aortic Valve Replacement - Percutaneous  Transfemoral Approach  Edwards Sapien 3 THV (size 23 mm, model # 9600TFX, serial # [id number])   Co-Surgeons:  Valentina Gu. Roxy Manns, MD, MD and Sherren Mocha, MD  Anesthesiologist:  Suann Larry, MD  Echocardiographer:  Ena Dawley, MD  Pre-operative Echo Findings:  Severe aortic stenosis  Normal left ventricular systolic function  Post-operative Echo Findings:  No paravalvular leak  Normal left ventricular systolic function  Small perimembranous VSD  BRIEF CLINICAL NOTE AND INDICATIONS FOR SURGERY  84 year old woman with hypertension, stage III chronic kidney disease, chronic back pain, and severe symptomatic aortic stenosis with exertional shortness of breath and fatigue.  Noninvasive imaging studies demonstrated an LVEF of 70%, mean transaortic valve gradient of 42 mmHg, and calculated aortic valve area of 0.87 cm.  Preoperative assessment demonstrated patent coronary arteries with mild nonobstructive disease, and suitable anatomy for TAVR via a transfemoral approach.  During the course of the patient's preoperative work up they have been evaluated comprehensively by a multidisciplinary team of specialists coordinated through the [facility] in the [place] [facility].  They have been demonstrated to suffer from symptomatic severe aortic stenosis as noted above. The patient has been counseled extensively as to the relative risks and benefits of all options for the treatment of severe aortic stenosis including long term medical therapy, conventional surgery for aortic valve replacement, and transcatheter aortic valve replacement.  The patient has been  independently evaluated by two cardiac surgeons including Dr. Roxy Manns and Dr. Cyndia Bent, and they are felt to be at moderate risk for conventional surgical aortic valve replacement. Both surgeons indicated the patient would be a poor candidate for conventional surgery because of comorbidities including advanced age.   Based upon review of all of the patient's preoperative diagnostic tests they are felt to be candidate for transcatheter aortic valve replacement using the transfemoral approach as an alternative to high risk conventional surgery.    Following the decision to proceed with transcatheter aortic valve replacement, a discussion has been held regarding what types of management strategies would be attempted intraoperatively in the event of life-threatening complications, including whether or not the patient would be considered a candidate for the use of cardiopulmonary bypass and/or conversion to open sternotomy for attempted surgical intervention.  The patient has been advised of a variety of complications that might develop peculiar to this approach including but not limited to risks of death, stroke, paravalvular leak, aortic dissection or other major vascular complications, aortic annulus rupture, device embolization, cardiac rupture or perforation, acute myocardial infarction, arrhythmia, heart block or bradycardia requiring permanent pacemaker placement, congestive heart failure, respiratory failure, renal failure, pneumonia, infection, other late complications related to structural valve deterioration or migration, or other complications that might ultimately cause a temporary or permanent loss of functional independence or other long term morbidity.  The patient provides full informed consent for the procedure as described and all questions were answered preoperatively.  DETAILS OF THE OPERATIVE PROCEDURE  PREPARATION:   The patient is brought to the operating room on the above mentioned date and  central monitoring was established by the anesthesia team including placement of a central venous catheter and radial arterial line. The patient is placed in the supine position  on the operating table.  Intravenous antibiotics are administered. The patient is monitored closely throughout the procedure under conscious sedation. A Foley catheter is placed.  Baseline transthoracic echocardiogram is performed. The patient's chest, abdomen, both groins, and both lower extremities are prepared and draped in a sterile manner. A time out procedure is performed.   PERIPHERAL ACCESS:   Using ultrasound guidance, femoral arterial and venous access is obtained with placement of 6 Fr sheaths on the left side.  Ultrasound images are captured and stored in the patient's chart. A pigtail diagnostic catheter was passed through the femoral arterial sheath under fluoroscopic guidance into the aortic root.  A temporary transvenous pacemaker catheter was passed through the femoral venous sheath under fluoroscopic guidance into the right ventricle.  The pacemaker was tested to ensure stable lead placement and pacemaker capture. Aortic root angiography was performed in order to determine the optimal angiographic angle for valve deployment.  TRANSFEMORAL ACCESS:  A micropuncture technique is used to access the right femoral artery under fluoroscopic and ultrasound guidance.  2 Perclose devices are deployed at 10' and 2' positions to 'PreClose' the femoral artery. An 8 French sheath is placed and then an Amplatz Superstiff wire is advanced through the sheath. This is changed out for a 14 French transfemoral E-Sheath after progressively dilating over the Superstiff wire.  An AL-1 catheter was used to direct a straight-tip exchange length wire across the native aortic valve into the left ventricle. This was exchanged out for a pigtail catheter and position was confirmed in the LV apex. Simultaneous LV and Ao pressures were recorded.   The pigtail catheter was exchanged for an Amplatz Extra-stiff wire in the LV apex.  Echocardiography was utilized to confirm appropriate wire position and no sign of entanglement in the mitral subvalvular apparatus.  BALLOON AORTIC VALVULOPLASTY:  Not performed   TRANSCATHETER HEART VALVE DEPLOYMENT:  An Edwards Sapien 3 transcatheter heart valve (size 23 mm, model #9600TFX, serial #[id number]) was prepared and crimped per manufacturer's guidelines, and the proper orientation of the valve is confirmed on the [employer] delivery system. The valve was advanced through the introducer sheath using normal technique until in an appropriate position in the abdominal aorta beyond the sheath tip. The balloon was then retracted and using the fine-tuning wheel was centered on the valve. The valve was then advanced across the aortic arch using appropriate flexion of the catheter. The valve was carefully positioned across the aortic valve annulus. The Commander catheter was retracted using normal technique. Once final position of the valve has been confirmed by angiographic assessment, the valve is deployed while temporarily holding ventilation and during rapid ventricular pacing to maintain systolic blood pressure < 50 mmHg and pulse pressure < 10 mmHg. The balloon inflation is held for >3 seconds after reaching full deployment volume. Once the balloon has fully deflated the balloon is retracted into the ascending aorta and valve function is assessed using echocardiography. There is felt to be no paravalvular leak and no central aortic insufficiency.  The patient's hemodynamic recovery following valve deployment is good.  The deployment balloon and guidewire are both removed. Echo demostrated acceptable post-procedural gradients, stable mitral valve function, and no aortic insufficiency. The patient had complete AV block following valve deployment and requires temporary transvenous pacing after the procedure. There is  a small color doppler jet seen near the site of the TAVR valve suggestive of left-to-right flow into the RV. We were suspicious of a small VSD and elected to pass  a TEE probe for further evaluation. TEE imaging confirms a small perimembranous VSD. The patient is hemodynamically stable and after team discussion we elected to follow this with serial echo studies.   PROCEDURE COMPLETION:  The sheath was removed and femoral artery closure is performed using the 2 previously deployed Perclose devices.  Protamine is administered once femoral arterial repair was complete. The site is clear with no evidence of bleeding or hematoma after the sutures are tightened. The temporary pacemaker, pigtail catheters and femoral sheaths were removed with manual pressure used for hemostasis.   The patient tolerated the procedure well and is transported to the surgical intensive care in stable condition. There were no immediate intraoperative complications. All sponge instrument and needle counts are verified correct at completion of the operation.   The patient received a total of 35.2 mL of intravenous contrast during the procedure.   Sherren Mocha, MD 03/18/2018 10:16 AM

## 2018-03-18 NOTE — Progress Notes (Addendum)
PHARMACY NOTE:  ANTIMICROBIAL RENAL DOSAGE ADJUSTMENT  Current antimicrobial regimen includes a mismatch between antimicrobial dosage and estimated renal function.  As per policy approved by the Pharmacy & Therapeutics and Medical Executive Committees, the antimicrobial dosage will be adjusted accordingly.  Current antimicrobial dosage:  Vancomycin 1000mg  X1; Zinacef 1.5gm IV q12h x4 doses  Indication: Surgical prophylaxis  Renal Function:  Estimated Creatinine Clearance: 20.6 mL/min (A) (by C-G formula based on SCr of 1.33 mg/dL (H)).   Comments:  -Vancomycin 1250mg  IV was given at ~ 7:30am today. No additional vancomycin is needed (this dose will cover > 24 hours).  -Zinacef: will change to 1.5gm IV q24h x2 doses to cover 48hrs   Thank you for allowing pharmacy to be a part of this patient's care.  Hildred Laser, PharmD Clinical Pharmacist Clinical phone from 8:30-4:00 is [phone] After 4pm, please call Main Rx [phone]) for assistance. 03/18/2018 10:27 AM

## 2018-03-18 NOTE — Progress Notes (Addendum)
  [place] VALVE TEAM  Patient doing well s/p TAVR. She is hemodynamically stable. Groin sites stable, temp wire in place. She is no longer pacing and has a narrow QRS complex. Will get ECG to document. EP has been consulted. She was on both neo and nitro, but these have both been discontinued and BP is stable. Continue to monitor closely.    Having some back pain and right leg aching. She has a small ridge of hematoma on right groin. Will continue to monitor. Okay to remove arterial line.    Angelena Form PA-C  [facility]  Pager [phone]

## 2018-03-18 NOTE — Progress Notes (Signed)
  Echocardiogram 2D Echocardiogram has been performed post TAVR procedure.  Bobbye Charleston 03/18/2018, 4:06 PM

## 2018-03-18 NOTE — Anesthesia Postprocedure Evaluation (Signed)
Anesthesia Post Note  Patient: Lenna L Lythgoe  Procedure(s) Performed: TRANSCATHETER AORTIC VALVE REPLACEMENT, TRANSFEMORAL (N/A Chest) INTRAOPERATIVE TRANSTHORACIC ECHOCARDIOGRAM (N/A )     Patient location during evaluation: SICU Anesthesia Type: MAC Level of consciousness: awake and alert Pain management: pain level controlled Vital Signs Assessment: post-procedure vital signs reviewed and stable Respiratory status: spontaneous breathing, nonlabored ventilation, respiratory function stable and patient connected to nasal cannula oxygen Cardiovascular status: stable and blood pressure returned to baseline Postop Assessment: no apparent nausea or vomiting Anesthetic complications: no    Last Vitals:  Vitals:   03/18/18 0728 03/18/18 0904  BP:    Pulse: (!) 0 81  Resp: (!) 0   Temp:    SpO2: (!) 0%     Last Pain:  Vitals:   03/18/18 0615  TempSrc: Oral  PainSc:                  Pernella Ackerley,W. EDMOND

## 2018-03-18 NOTE — Interval H&P Note (Signed)
History and Physical Interval Note:  03/18/2018 6:32 AM  Kaitlin Villarreal  has presented today for surgery, with the diagnosis of Severe Aortic Stenosis  The various methods of treatment have been discussed with the patient and family. After consideration of risks, benefits and other options for treatment, the patient has consented to  Procedure(s): TRANSCATHETER AORTIC VALVE REPLACEMENT, TRANSFEMORAL (N/A) TRANSESOPHAGEAL ECHOCARDIOGRAM (TEE) (N/A) as a surgical intervention .  The patient's history has been reviewed, patient examined, no change in status, stable for surgery.  I have reviewed the patient's chart and labs.  Questions were answered to the patient's satisfaction.     Rexene Alberts

## 2018-03-18 NOTE — Anesthesia Procedure Notes (Signed)
Arterial Line Insertion Start/End6/18/2019 6:40 AM, 03/18/2018 6:55 AM Performed by: Leonor Liv, CRNA, CRNA  Patient location: Pre-op. Preanesthetic checklist: patient identified, IV checked, site marked, risks and benefits discussed, surgical consent, monitors and equipment checked, pre-op evaluation, timeout performed and anesthesia consent Lidocaine 1% used for infiltration and patient sedated Left, radial was placed Catheter size: 20 G Hand hygiene performed  and maximum sterile barriers used  Allen's test indicative of satisfactory collateral circulation Attempts: 1 Procedure performed without using ultrasound guided technique. Following insertion, dressing applied and Biopatch. Post procedure complications: local hematoma. Patient tolerated the procedure well with no immediate complications.

## 2018-03-18 NOTE — Progress Notes (Signed)
      [address]       [place] [address]             [phone]      S/p TAVR  Resting comfortably  BP (!) 137/49   Pulse 81   Temp 97.6 F (36.4 C) (Oral)   Resp 18   Ht 5' (1.524 m)   Wt 120 lb (54.4 kg)   SpO2 100%   BMI 23.44 kg/m   Intake/Output Summary (Last 24 hours) at 03/18/2018 1856 Last data filed at 03/18/2018 1800 Gross per 24 hour  Intake 895.72 ml  Output 825 ml  Net 70.72 ml   Hct= 21   Kaitlin Villarreal C. Roxan Hockey, MD [facility] [phone]

## 2018-03-18 NOTE — Progress Notes (Signed)
Dr. Therisa Doyne made aware of blood pressure, no orders received at this time

## 2018-03-18 NOTE — Consult Note (Addendum)
Cardiology Consultation:   Patient ID: Kaitlin Villarreal; [id number]; 1926/03/23   Admit date: 03/18/2018 Date of Consult: 03/18/2018  Primary Care Provider: Angelina Sheriff, MD Primary Cardiologist: Dr. Bettina Gavia TAVR: Dr. Burt Knack Primary Electrophysiologist:  New today    Patient Profile:   Kaitlin Villarreal is a 84 y.o. female with a hx of HTN, HLD, CKD, and VHD w/severe AS who is being seen today for the evaluation of CHB inter-op with TAVR procedure at the request of Dr. Burt Knack.  History of Present Illness:   Ms. Hosterman was admitted today to undergo TAVR procedure 2/2 her severe nonrheumatic AS.  EP is being asked to see the patient for PPM evaluation.  In d/w TAVR PA, inter-operatively the patient with deployment of the TAVR developed a small membranous VSD and CHB without V escape requiring temp pacing wire placement,, and not felt that she will have recovery of her AV conduction.  LABS 03/14/18 K+ 4.4 BUN/Creat 33/1.33 WBC 5.1 H/H 11/37 Plts 245  The patient is awake, alert, but sleepy.  Denies any complaints at this time other then dry moth.  She states she was told she will need a pacemaker.    Past Medical History:  Diagnosis Date  . Arthritis   . Bone spur of toe of left foot   . Chronic diastolic (congestive) heart failure ([place])   . Essential hypertension   . GERD (gastroesophageal reflux disease)   . HLD (hyperlipidemia)   . Pancreatic mass    benign appearing but needs f/u  . 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 S/P TAVR (transcatheter aortic valve replacement)   . Severe aortic stenosis   . Stage 3 chronic kidney disease ([place])   . TIA (transient ischemic attack)     a. 1992    Past Surgical History:  Procedure Laterality Date  . ABDOMINAL HYSTERECTOMY    . APPENDECTOMY    . HERNIA REPAIR    . RIGHT/LEFT HEART CATH AND CORONARY ANGIOGRAPHY N/A 02/06/2018   Procedure: RIGHT/LEFT HEART CATH AND CORONARY ANGIOGRAPHY;  Surgeon: Sherren Mocha, MD;  Location: [facility];  Service: Cardiovascular;  Laterality: N/A;  . TONSILLECTOMY    . WISDOM TOOTH EXTRACTION         Inpatient Medications: Scheduled Meds: . [START ON 03/19/2018] acetaminophen  1,000 mg Oral Q6H   Or  . [START ON 03/19/2018] acetaminophen (TYLENOL) oral liquid 160 mg/5 mL  1,000 mg Per Tube Q6H  . [START ON 03/19/2018] aspirin EC  81 mg Oral Daily   Or  . [START ON 03/19/2018] aspirin  81 mg Per Tube Daily  . [START ON 03/19/2018] clopidogrel  75 mg Oral Q breakfast  . estradiol  0.5 mg Oral Daily  . famotidine  40 mg Oral Daily  . Fish Oil  1,000 mg Oral Daily  . pravastatin  20 mg Oral QHS   Continuous Infusions: . sodium chloride    . albumin human    . cefUROXime (ZINACEF)  IV    . DOPamine    . lactated ringers    . nitroGLYCERIN    . phenylephrine (NEO-SYNEPHRINE) Adult infusion    . vancomycin     PRN Meds: albumin human, lactated ringers, metoprolol tartrate, midazolam, morphine injection, ondansetron (ZOFRAN) IV, oxyCODONE, Polyethyl Glycol-Propyl Glycol, traMADol  Allergies:    Allergies  Allergen Reactions  . Levofloxacin Other (See Comments)    Hallucinations  . Azithromycin Other (See Comments)  Unknown  . Clarithromycin Other (See Comments)    Felt like body was swollen, felt awful  . Latex Rash and Other (See Comments)    Causes sores    Social History:   Social History   Socioeconomic History  . Marital status: Married    Spouse name: Not on file  . Number of children: Not on file  . Years of education: Not on file  . Highest education level: Not on file  Occupational History  . Not on file  Social Needs  . Financial resource strain: Not on file  . Food insecurity:    Worry: Not on file    Inability: Not on file  . Transportation needs:    Medical: Not on file    Non-medical: Not on file  Tobacco Use  . Smoking status: Never Smoker  . Smokeless tobacco: Never Used  Substance and Sexual Activity  . Alcohol use: No  .  Drug use: No  . Sexual activity: Not on file  Lifestyle  . Physical activity:    Days per week: Not on file    Minutes per session: Not on file  . Stress: Not on file  Relationships  . Social connections:    Talks on phone: Not on file    Gets together: Not on file    Attends religious service: Not on file    Active member of club or organization: Not on file    Attends meetings of clubs or organizations: Not on file    Relationship status: Not on file  . Intimate partner violence:    Fear of current or ex partner: Not on file    Emotionally abused: Not on file    Physically abused: Not on file    Forced sexual activity: Not on file  Other Topics Concern  . Not on file  Social History Narrative  . Not on file    Family History:   Family History  Problem Relation Age of Onset  . Heart disease Mother   . Uterine cancer Sister   . Congenital heart disease Brother   . Heart attack Brother      ROS:  Please see the history of present illness.  All other ROS reviewed and negative.     Physical Exam/Data:   Vitals:   03/18/18 0615 03/18/18 0620 03/18/18 0728 03/18/18 0904  BP: (!) 203/66 (!) 204/61    Pulse: 78  (!) 0 81  Resp: 18  (!) 0   Temp: 97.6 F (36.4 C)     TempSrc: Oral     SpO2: 98%  (!) 0%   Weight:      Height:        Intake/Output Summary (Last 24 hours) at 03/18/2018 1020 Last data filed at 03/18/2018 1000 Gross per 24 hour  Intake 400 ml  Output 25 ml  Net 375 ml   Filed Weights   03/18/18 0541  Weight: 120 lb (54.4 kg)   Body mass index is 23.44 kg/m.  General:  Well nourished, well developed, in no acute distress, sleepy but wakes easily HEENT: normal Lymph: no adenopathy Neck: no JVD Endocrine:  No thryomegaly Vascular: No carotid bruits; FA pulses 2+ bilaterally without bruits  Cardiac:  RRR; no murmurs, gallops or rubs Lungs:  CTA b/l (ant/lat ausc only), no wheezing, rhonchi or rales  Abd: soft, nontender Ext: no  edema Musculoskeletal:  No deformities, age appropriate atrophy Skin: warm and dry  Neuro:  No gross focal  abnormalities noted Psych:  Normal affect, she is pleasant, sleepy  EKG:  The EKG was personally reviewed and demonstrates:   03/14/18: SR 72bpm,  PR 164bpm, QRS 66ms, QTc 458ms Telemetry:  Telemetry was personally reviewed and demonstrates:   V pacing at 70  Relevant CV Studies:  02/06/18: LHC  Ost 1st Diag lesion is 50% stenosed.  Prox Cx lesion is 40% stenosed.  There is severe aortic valve stenosis. 1.  Calcified coronary arteries without significant stenosis.  Mild proximal left circumflex stenosis, minimal irregularity of the RCA, minimal irregularity of the LAD and moderate stenosis of an ostial diagonal 2.  Severely calcified aortic valve with known severe aortic stenosis by noninvasive assessment 3.  Normal right heart hemodynamics with preserved cardiac output and normal intracardiac filling pressures  Recommendations: Continue multidisciplinary evaluation for TAVR.  Medical therapy for mild nonobstructive CAD.  12/23/17: TTE LVEF >70% Severe AS Mild-mod MR, mild MS Mild TR Mild conc LVH   Laboratory Data:  Chemistry Recent Labs  Lab 03/14/18 1059  NA 139  K 4.4  CL 106  CO2 26  GLUCOSE 91  BUN 33*  CREATININE 1.33*  CALCIUM 9.0  GFRNONAA 34*  GFRAA 40*  ANIONGAP 7    Recent Labs  Lab 03/14/18 1059  PROT 7.2  ALBUMIN 4.1  AST 24  ALT 15  ALKPHOS 48  BILITOT 0.6   Hematology Recent Labs  Lab 03/14/18 1059  WBC 5.1  RBC 3.99  HGB 11.6*  HCT 37.8  MCV 94.7  MCH 29.1  MCHC 30.7  RDW 13.3  PLT 245   Cardiac EnzymesNo results for input(s): TROPONINI in the last 168 hours. No results for input(s): TROPIPOC in the last 168 hours.  BNP Recent Labs  Lab 03/14/18 1100  BNP 174.0*    DDimer No results for input(s): DDIMER in the last 168 hours.  Radiology/Studies:    Assessment and Plan:   1.  inter-op with deployment of TAVR,  developed CHB without V escape       VVI pacing via temp wire @70 , (left groin)      Pt with good BP  Discussed with Dr. Burt Knack, VSD is small, and inter-op echo without any effusion Dr. Rayann Heman will see, initial thoughts are to monitor post TAVR overnight given temp wire in place (+pads) and plan pacing likely for tomorrow.   For questions or updates, please contact [place] Please consult [email or web address] for contact info under Cardiology/STEMI.   Signed, Baldwin Jamaica, PA-C  03/18/2018 10:20 AM   I have seen, examined the patient, and reviewed the above assessment and plan.  Changes to above are made where necessary.  On exam, RRR.  Currently, AV nodal conduction is improved.  Will follow clinically and consider PPM implantation if she continues to have worsening conduction issues.  Dr Curt Bears to see again in am.  Co Sign: Thompson Grayer, MD 03/18/2018 10:23 PM

## 2018-03-18 NOTE — Anesthesia Procedure Notes (Signed)
Central Venous Catheter Insertion Performed by: Lillia Abed, MD, anesthesiologist Start/End6/18/2019 6:40 AM, 03/18/2018 6:50 AM Patient location: Pre-op. Preanesthetic checklist: patient identified, IV checked, risks and benefits discussed, surgical consent, monitors and equipment checked, pre-op evaluation, timeout performed and anesthesia consent Position: Trendelenburg Lidocaine 1% used for infiltration and patient sedated Hand hygiene performed , maximum sterile barriers used  and Seldinger technique used Catheter size: 8 Fr Total catheter length 16. Central line was placed.Double lumen Procedure performed using ultrasound guided technique. Ultrasound Notes:anatomy identified, needle tip was noted to be adjacent to the nerve/plexus identified, no ultrasound evidence of intravascular and/or intraneural injection and image(s) printed for medical record Attempts: 1 Following insertion, dressing applied, line sutured and Biopatch. Post procedure assessment: blood return through all ports  Patient tolerated the procedure well with no immediate complications.

## 2018-03-18 NOTE — Progress Notes (Signed)
  Echocardiogram 2D Echocardiogram has been performed.  Kaitlin Villarreal 03/18/2018, 11:35 AM

## 2018-03-19 ENCOUNTER — Inpatient Hospital Stay (HOSPITAL_COMMUNITY): Payer: Medicare Other

## 2018-03-19 ENCOUNTER — Other Ambulatory Visit: Payer: Self-pay | Admitting: Physician Assistant

## 2018-03-19 ENCOUNTER — Encounter (HOSPITAL_COMMUNITY): Payer: Self-pay | Admitting: Cardiovascular Disease

## 2018-03-19 DIAGNOSIS — I350 Nonrheumatic aortic (valve) stenosis: Secondary | ICD-10-CM

## 2018-03-19 DIAGNOSIS — Z952 Presence of prosthetic heart valve: Secondary | ICD-10-CM

## 2018-03-19 DIAGNOSIS — Z954 Presence of other heart-valve replacement: Secondary | ICD-10-CM

## 2018-03-19 DIAGNOSIS — I340 Nonrheumatic mitral (valve) insufficiency: Secondary | ICD-10-CM

## 2018-03-19 LAB — ECHOCARDIOGRAM COMPLETE
HEIGHTINCHES: 60 in
Weight: 1920 oz

## 2018-03-19 LAB — BASIC METABOLIC PANEL
ANION GAP: 6 (ref 5–15)
BUN: 19 mg/dL (ref 6–20)
CHLORIDE: 108 mmol/L (ref 101–111)
CO2: 26 mmol/L (ref 22–32)
Calcium: 8.2 mg/dL — ABNORMAL LOW (ref 8.9–10.3)
Creatinine, Ser: 0.97 mg/dL (ref 0.44–1.00)
GFR calc Af Amer: 58 mL/min — ABNORMAL LOW (ref >60)
GFR calc non Af Amer: 50 mL/min — ABNORMAL LOW (ref >60)
GLUCOSE: 98 mg/dL (ref 65–99)
Potassium: 3.9 mmol/L (ref 3.5–5.1)
Sodium: 140 mmol/L (ref 135–145)

## 2018-03-19 LAB — CBC
HEMATOCRIT: 32.9 % — AB (ref 36.0–46.0)
HEMOGLOBIN: 10.4 g/dL — AB (ref 12.0–15.0)
MCH: 29.7 pg (ref 26.0–34.0)
MCHC: 31.6 g/dL (ref 30.0–36.0)
MCV: 94 fL (ref 78.0–100.0)
Platelets: 156 10*3/uL (ref 150–400)
RBC: 3.5 MIL/uL — ABNORMAL LOW (ref 3.87–5.11)
RDW: 13.3 % (ref 11.5–15.5)
WBC: 7.6 10*3/uL (ref 4.0–10.5)

## 2018-03-19 LAB — MAGNESIUM: Magnesium: 2 mg/dL (ref 1.7–2.4)

## 2018-03-19 MED ORDER — CHLORHEXIDINE GLUCONATE 4 % EX LIQD
60.0000 mL | Freq: Once | CUTANEOUS | Status: AC
  Administered 2018-03-19: 14:00:00 via TOPICAL
  Filled 2018-03-19: qty 15

## 2018-03-19 MED ORDER — SODIUM CHLORIDE 0.9 % IV SOLN
80.0000 mg | INTRAVENOUS | Status: AC
  Administered 2018-03-20: 80 mg

## 2018-03-19 MED ORDER — SODIUM CHLORIDE 0.9% FLUSH: 10.0000 mL | INTRAVENOUS | Status: DC | PRN

## 2018-03-19 MED ORDER — CHLORHEXIDINE GLUCONATE 4 % EX LIQD
CUTANEOUS | Status: AC
  Filled 2018-03-19: qty 15

## 2018-03-19 MED ORDER — SODIUM CHLORIDE 0.9 % IV SOLN: 250.0000 mL | INTRAVENOUS | Status: DC

## 2018-03-19 MED ORDER — SODIUM CHLORIDE 0.9 % IV SOLN: INTRAVENOUS | Status: DC

## 2018-03-19 MED ORDER — SODIUM CHLORIDE 0.9% FLUSH
3.0000 mL | Freq: Two times a day (BID) | INTRAVENOUS | Status: DC
  Administered 2018-03-19 – 2018-03-20 (×2): 3 mL via INTRAVENOUS

## 2018-03-19 MED ORDER — SODIUM CHLORIDE 0.9% FLUSH: 3.0000 mL | INTRAVENOUS | Status: DC | PRN

## 2018-03-19 MED ORDER — SODIUM CHLORIDE 0.9% FLUSH
10.0000 mL | Freq: Two times a day (BID) | INTRAVENOUS | Status: DC
  Administered 2018-03-19 – 2018-03-22 (×6): 10 mL
  Administered 2018-03-22: 3 mL
  Administered 2018-03-23 (×2): 10 mL

## 2018-03-19 MED ORDER — CHLORHEXIDINE GLUCONATE CLOTH 2 % EX PADS
6.0000 | MEDICATED_PAD | Freq: Every day | CUTANEOUS | Status: DC
  Administered 2018-03-19 – 2018-03-21 (×3): 6 via TOPICAL

## 2018-03-19 MED ORDER — CHLORHEXIDINE GLUCONATE 4 % EX LIQD
60.0000 mL | Freq: Once | CUTANEOUS | Status: AC
  Administered 2018-03-19: 4 via TOPICAL
  Filled 2018-03-19: qty 60

## 2018-03-19 MED FILL — Sodium Chloride IV Soln 0.9%: INTRAVENOUS | Qty: 250 | Status: AC

## 2018-03-19 MED FILL — Potassium Chloride Inj 2 mEq/ML: INTRAVENOUS | Qty: 40 | Status: AC

## 2018-03-19 MED FILL — Heparin Sodium (Porcine) Inj 1000 Unit/ML: INTRAMUSCULAR | Qty: 30 | Status: AC

## 2018-03-19 MED FILL — Magnesium Sulfate Inj 50%: INTRAMUSCULAR | Qty: 10 | Status: AC

## 2018-03-19 MED FILL — Phenylephrine HCl IV Soln 10 MG/ML: INTRAVENOUS | Qty: 2 | Status: AC

## 2018-03-19 NOTE — Progress Notes (Signed)
Pt with pacing at 50 - she became hot and dizzy, RN increased HR to 60 but symptoms continued and rate increased to 70.   BP stable 145/51 she is feeling better, I discussed with Dr. Curt Bears and once she has had time to recover will decrease HR to 60 on PPM.

## 2018-03-19 NOTE — Progress Notes (Signed)
TCTS BRIEF SICU PROGRESS NOTE  1 Day Post-Op  S/P Procedure(s) (LRB): TRANSCATHETER AORTIC VALVE REPLACEMENT, TRANSFEMORAL (N/A) INTRAOPERATIVE TRANSTHORACIC ECHOCARDIOGRAM (N/A)   Patient currently in CHB w/ Vpacing She nearly had a syncopal episode earlier I have reviewed f/u ECHO and discussed results w/ patient and her family  Plan: Await PPM implantation  Rexene Alberts, MD 03/19/2018 6:26 PM

## 2018-03-19 NOTE — Progress Notes (Signed)
Pt HR dropped <50, became symptomatic feeling hot and dizzy. Pt's BP remained stable at 138/43. Pt's HOB was lowered, and pacer was turned up from 50 to 60, then up to 70. Pacemaker was paused for a few seconds to see pt's underlying rhythm which displayed no ventricular activity. Cecilie Kicks, NP was notified who then spoke with Dr. Elliot Cousin and gave a verbal order to place pacer back at 64. Will continue to monitor pt closely.

## 2018-03-19 NOTE — Progress Notes (Addendum)
Progress Note  Patient Name: Kaitlin Villarreal Date of Encounter: 03/19/2018  Primary Cardiologist: Shirlee More, MD  TAVR- Dr. Jeronimo Greaves. Roxy Manns  Subjective   Feeling better with head elevated, no dizziness, no chest pain.  Inpatient Medications    Scheduled Meds: . acetaminophen  1,000 mg Oral Q6H   Or  . acetaminophen (TYLENOL) oral liquid 160 mg/5 mL  1,000 mg Per Tube Q6H  . amLODipine  2.5 mg Oral Daily  . aspirin EC  81 mg Oral Daily   Or  . aspirin  81 mg Per Tube Daily  . clopidogrel  75 mg Oral Q breakfast  . estradiol  0.5 mg Oral Daily  . famotidine  20 mg Oral Daily  . omega-3 acid ethyl esters  1,000 mg Oral Daily  . pravastatin  20 mg Oral QHS   Continuous Infusions: . sodium chloride 50 mL/hr at 03/19/18 0700  . albumin human    . cefUROXime (ZINACEF)  IV 1.5 g (03/19/18 0744)  . lactated ringers    . nitroGLYCERIN Stopped (03/18/18 1410)  . phenylephrine (NEO-SYNEPHRINE) Adult infusion Stopped (03/18/18 1409)   PRN Meds: albumin human, lactated ringers, metoprolol tartrate, midazolam, morphine injection, ondansetron (ZOFRAN) IV, oxyCODONE, polyvinyl alcohol, traMADol, white petrolatum   Vital Signs    Vitals:   03/19/18 0400 03/19/18 0500 03/19/18 0600 03/19/18 0700  BP: (!) 144/43 (!) 143/45 (!) 133/44 (!) 136/40  Pulse:      Resp: 17 17 17 18   Temp: 98.4 F (36.9 C)     TempSrc: Axillary     SpO2: 97% 98% 98% 95%  Weight:      Height:        Intake/Output Summary (Last 24 hours) at 03/19/2018 0840 Last data filed at 03/19/2018 0700 Gross per 24 hour  Intake 1740.16 ml  Output 1175 ml  Net 565.16 ml   Filed Weights   03/18/18 0541  Weight: 120 lb (54.4 kg)    Telemetry    Some SR and some CHB, and 2:1 AV block - Personally Reviewed  ECG    SR with LAD, LBBB and QRS 140 ms - Personally Reviewed  Physical Exam   GEN: No acute distress.   Neck: No JVD Cardiac: RRR, no murmurs, rubs, or gallops.  Respiratory: Clear to auscultation  bilaterally ant and lateral only. GI: Soft, nontender, non-distended  MS: No edema; No deformity. Neuro:  Nonfocal  Psych: Normal affect   Labs    Chemistry Recent Labs  Lab 03/14/18 1059  03/18/18 1211 03/18/18 1250 03/19/18 0413  NA 139   < > 144 144 140  K 4.4   < > 4.5 4.9 3.9  CL 106   < > 112* 111 108  CO2 26  --   --   --  26  GLUCOSE 91   < > 109* 115* 98  BUN 33*   < > 39* 38* 19  CREATININE 1.33*   < > 2.80* 2.80* 0.97  CALCIUM 9.0  --   --   --  8.2*  PROT 7.2  --   --   --   --   ALBUMIN 4.1  --   --   --   --   AST 24  --   --   --   --   ALT 15  --   --   --   --   ALKPHOS 48  --   --   --   --  BILITOT 0.6  --   --   --   --   GFRNONAA 34*  --   --   --  50*  GFRAA 40*  --   --   --  58*  ANIONGAP 7  --   --   --  6   < > = values in this interval not displayed.     Hematology Recent Labs  Lab 03/14/18 1059  03/18/18 1211 03/18/18 1250 03/19/18 0413  WBC 5.1  --   --   --  7.6  RBC 3.99  --   --   --  3.50*  HGB 11.6*   < > 7.5* 7.1* 10.4*  HCT 37.8   < > 22.0* 21.0* 32.9*  MCV 94.7  --   --   --  94.0  MCH 29.1  --   --   --  29.7  MCHC 30.7  --   --   --  31.6  RDW 13.3  --   --   --  13.3  PLT 245  --   --   --  156   < > = values in this interval not displayed.    Cardiac EnzymesNo results for input(s): TROPONINI in the last 168 hours. No results for input(s): TROPIPOC in the last 168 hours.   BNP Recent Labs  Lab 03/14/18 1100  BNP 174.0*     DDimer No results for input(s): DDIMER in the last 168 hours.   Radiology    Dg Chest Port 1 View  Result Date: 03/18/2018 CLINICAL DATA:  Status post TAVR EXAM: PORTABLE CHEST 1 VIEW COMPARISON:  03/14/2018 FINDINGS: Cardiac shadow is at the upper limits of normal in size but accentuated by the portable technique. Right jugular central line, temporary pacing device and trans aortic valve prosthesis are now seen. Lungs are well aerated bilaterally without focal infiltrate. No acute bony  abnormality is noted. IMPRESSION: Tubes and lines as described. Status post TAVR. Electronically Signed   By: Inez Catalina M.D.   On: 03/18/2018 11:06    Cardiac Studies   02/06/18: LHC  Ost 1st Diag lesion is 50% stenosed.  Prox Cx lesion is 40% stenosed.  There is severe aortic valve stenosis. 1. Calcified coronary arteries without significant stenosis. Mild proximal left circumflex stenosis, minimal irregularity of the RCA, minimal irregularity of the LAD and moderate stenosis of an ostial diagonal  2. Severely calcified aortic valve with known severe aortic stenosis by noninvasive assessment  3. Normal right heart hemodynamics with preserved cardiac output and normal intracardiac filling pressures  Recommendations: Continue multidisciplinary evaluation for TAVR. Medical therapy for mild nonobstructive CAD.    12/23/17: TTE  LVEF >70%  Severe AS  Mild-mod MR, mild MS  Mild TR  Mild conc LVH   Patient Profile     84 y.o. female with hx of  HTN, HLD, CKD, VHD and aortic stenosis, severe -- underwent TAVR 03/18/18, inter op with CHB and need for temp VVI pacing- following for need PPM.  Assessment & Plan    Inter-op with deployment of TAVR developed CHB without V escape, VVI pacing via temp wire @ 70-Lt groin.  --continue to follow tele, may need PPM this pm, Kaitlin Villarreal keep NPO after BK.    For questions or updates, please contact [place] Please consult [email or web address] for contact info under Cardiology/STEMI.      Signed, Kaitlin Kicks, NP  03/19/2018, 8:40 AM    I have seen and  examined this patient with Kaitlin Villarreal.  Agree with above, note added to reflect my findings.  On exam, RRR, no murmurs, lungs clear. Continuing to have pacing throughout the AM. Kaitlin Villarreal hold off on pacemaker as pacing is becoming less. Kaitlin Villarreal reassess for tomorrow.    Kaitlin Villarreal M. Pierce Biagini MD 03/19/2018 3:28 PM

## 2018-03-19 NOTE — Progress Notes (Addendum)
[facility] [facility] VALVE TEAM  Patient Name: Kaitlin Villarreal Date of Encounter: 03/19/2018  Primary Cardiologist: Dr. Bettina Gavia / Dr. Burt Knack & Dr. Roxy Manns (TAVR)  Hospital Problem List     Principal Problem:   S/P TAVR (transcatheter aortic valve replacement) Active Problems:   Essential hypertension   Hyperlipidemia   Stage 3 chronic kidney disease ([facility])   Pancreatic mass   Severe aortic stenosis     Subjective   Has some back pain but tylenol helped. No other complaints. No CP or SOB.   Inpatient Medications    Scheduled Meds: . acetaminophen  1,000 mg Oral Q6H   Or  . acetaminophen (TYLENOL) oral liquid 160 mg/5 mL  1,000 mg Per Tube Q6H  . amLODipine  2.5 mg Oral Daily  . aspirin EC  81 mg Oral Daily   Or  . aspirin  81 mg Per Tube Daily  . clopidogrel  75 mg Oral Q breakfast  . estradiol  0.5 mg Oral Daily  . famotidine  20 mg Oral Daily  . omega-3 acid ethyl esters  1,000 mg Oral Daily  . pravastatin  20 mg Oral QHS   Continuous Infusions: . sodium chloride 50 mL/hr at 03/19/18 0700  . albumin human    . cefUROXime (ZINACEF)  IV 1.5 g (03/19/18 0744)   PRN Meds: albumin human, ondansetron (ZOFRAN) IV, polyvinyl alcohol, traMADol, white petrolatum   Vital Signs    Vitals:   03/19/18 0400 03/19/18 0500 03/19/18 0600 03/19/18 0700  BP: (!) 144/43 (!) 143/45 (!) 133/44 (!) 136/40  Pulse:      Resp: 17 17 17 18   Temp: 98.4 F (36.9 C)     TempSrc: Axillary     SpO2: 97% 98% 98% 95%  Weight:      Height:        Intake/Output Summary (Last 24 hours) at 03/19/2018 1015 Last data filed at 03/19/2018 0700 Gross per 24 hour  Intake 1340.16 ml  Output 1150 ml  Net 190.16 ml   Filed Weights   03/18/18 0541  Weight: 120 lb (54.4 kg)    Physical Exam   GEN: Well nourished, well developed, in no acute distress.  HEENT: Grossly normal.  Neck: Supple, no JVD, carotid bruits, or masses. Cardiac: RRR, no murmurs, rubs, or  gallops. No clubbing, cyanosis, edema.  Radials/DP/PT 2+ and equal bilaterally.  Respiratory:  Respirations regular and unlabored, clear to auscultation bilaterally. GI: Soft, nontender, nondistended, BS + x 4. MS: no deformity or atrophy. Skin: warm and dry, no rash. Small hematoma in right groin that has softened since yesterday  Neuro:  Strength and sensation are intact. Psych: AAOx3.  Normal affect.  Labs    CBC Recent Labs    03/18/18 1250 03/19/18 0413  WBC  --  7.6  HGB 7.1* 10.4*  HCT 21.0* 32.9*  MCV  --  94.0  PLT  --  409   Basic Metabolic Panel Recent Labs    03/18/18 1250 03/19/18 0413  NA 144 140  K 4.9 3.9  CL 111 108  CO2  --  26  GLUCOSE 115* 98  BUN 38* 19  CREATININE 2.80* 0.97  CALCIUM  --  8.2*  MG  --  2.0   Liver Function Tests No results for input(s): AST, ALT, ALKPHOS, BILITOT, PROT, ALBUMIN in the last 72 hours. No results for input(s): LIPASE, AMYLASE in the last 72 hours. Cardiac Enzymes No results for input(s):  CKTOTAL, CKMB, CKMBINDEX, TROPONINI in the last 72 hours. BNP Invalid input(s): POCBNP D-Dimer No results for input(s): DDIMER in the last 72 hours. Hemoglobin A1C No results for input(s): HGBA1C in the last 72 hours. Fasting Lipid Panel No results for input(s): CHOL, HDL, LDLCALC, TRIG, CHOLHDL, LDLDIRECT in the last 72 hours. Thyroid Function Tests No results for input(s): TSH, T4TOTAL, T3FREE, THYROIDAB in the last 72 hours.  Invalid input(s): FREET3  Telemetry    NSR with intermittent pacing - Personally Reviewed  ECG    SR with LAD, LBBB and QRS 140 ms - Personally Reviewed  Radiology    Dg Chest Port 1 View  Result Date: 03/18/2018 CLINICAL DATA:  Status post TAVR EXAM: PORTABLE CHEST 1 VIEW COMPARISON:  03/14/2018 FINDINGS: Cardiac shadow is at the upper limits of normal in size but accentuated by the portable technique. Right jugular central line, temporary pacing device and trans aortic valve prosthesis are  now seen. Lungs are well aerated bilaterally without focal infiltrate. No acute bony abnormality is noted. IMPRESSION: Tubes and lines as described. Status post TAVR. Electronically Signed   By: Inez Catalina M.D.   On: 03/18/2018 11:06    Cardiac Studies   TAVR OPERATIVE NOTE   Date of Procedure:                03/18/2018  Preoperative Diagnosis:      Severe Aortic Stenosis   Procedure:        Transcatheter Aortic Valve Replacement - Percutaneous Right Transfemoral Approach             Edwards Sapien 3 THV (size 23 mm, model # 9600TFX, serial # [id number])              Co-Surgeons:                        Valentina Gu. Roxy Manns, MD and Sherren Mocha, MD  Pre-operative Echo Findings: ? Severe aortic stenosis ? Normal left ventricular systolic function  Post-operative Echo Findings: ? No paravalvular leak ? Very small perimembranous ventricular septal defect ? No pericardial effusion ? Normal left ventricular systolic function  _______________  Limited echo 03/18/18  5 hours later a limited TTE was performed. It showed unchanged   small VSD, stable aortic valve with normal unchanged gradients.   No paravalvular leak. Trivial pericardial effusion  _______________  Post operative echo 03/19/18    Patient Profile     Kaitlin Villarreal is a 84 y.o. female with a history of HTN, HLD, CKD and severe AS who presented to [facility] on [facility]18[facility]18 for planned TAVR.   Assessment & Plan    Severe AS: s/p successful TAVR with a 23 mm Edwards Sapien 3 THV via the TF approach on 03/18/18. Surgery was c/b a small VSD that remained stable by serial echocardiograms. She also developed CHB and temp wire was left in place. POD1 echo pending. Groin sites are stable. Continue ASA and plavix.   VSD: serial echos show stable, small VSD with L--> R shunt and trivial pericardial effusion. Repeat echo this AM pending.   Complete heart block: she developed CHB without V escape during the procedure. Temp wire was  left in place. She then regained AV nodal conduction, but has had intermittent heart block overnight. EP has been consulted and will likely place a permanent pacemaker later today. She is currently NPO  HTN: BP stable. Holding home BB given heart block    Signed, Curt Bears  [place]ville Silos, PA-C  03/19/2018, 10:15 AM  Pager [phone]  I have seen and examined the patient and agree with the assessment and plan as outlined.  Rexene Alberts, MD 03/19/2018

## 2018-03-19 NOTE — Progress Notes (Signed)
  Echocardiogram 2D Echocardiogram has been performed.  Kaitlin Villarreal 03/19/2018, 11:51 AM

## 2018-03-19 NOTE — Progress Notes (Signed)
Dr. Roxy Manns at bedside to evaluate pt. Pt's pacemaker was turned back up to 70 by Dr. Roxy Manns. Pt not currently c/o any distress and HR >70. Pt informed by Dr of pacemaker schedule for tomorrow.

## 2018-03-20 ENCOUNTER — Inpatient Hospital Stay (HOSPITAL_COMMUNITY)
Admission: RE | Disposition: A | Discharge status: Home Health | Payer: Self-pay | Source: Home / Self Care | Attending: Thoracic Surgery (Cardiothoracic Vascular Surgery)

## 2018-03-20 DIAGNOSIS — I442 Atrioventricular block, complete: Secondary | ICD-10-CM

## 2018-03-20 DIAGNOSIS — Q210 Ventricular septal defect: Secondary | ICD-10-CM

## 2018-03-20 HISTORY — PX: PACEMAKER IMPLANT: EP1218

## 2018-03-20 HISTORY — PX: RIGHT HEART CATH: CATH118263

## 2018-03-20 LAB — POCT I-STAT 3, VENOUS BLOOD GAS (G3P V)
ACID-BASE DEFICIT: 6 mmol/L — AB (ref 0.0–2.0)
ACID-BASE DEFICIT: 6 mmol/L — AB (ref 0.0–2.0)
Acid-base deficit: 6 mmol/L — ABNORMAL HIGH (ref 0.0–2.0)
BICARBONATE: 18.6 mmol/L — AB (ref 20.0–28.0)
BICARBONATE: 19.6 mmol/L — AB (ref 20.0–28.0)
Bicarbonate: 18.9 mmol/L — ABNORMAL LOW (ref 20.0–28.0)
O2 SAT: 49 %
O2 SAT: 52 %
O2 Saturation: 45 %
PCO2 VEN: 34.9 mmHg — AB (ref 44.0–60.0)
PO2 VEN: 26 mmHg — AB (ref 32.0–45.0)
TCO2: 20 mmol/L — AB (ref 22–32)
TCO2: 20 mmol/L — AB (ref 22–32)
TCO2: 21 mmol/L — AB (ref 22–32)
pCO2, Ven: 33 mmHg — ABNORMAL LOW (ref 44.0–60.0)
pCO2, Ven: 36.3 mmHg — ABNORMAL LOW (ref 44.0–60.0)
pH, Ven: 7.34 (ref 7.250–7.430)
pH, Ven: 7.341 (ref 7.250–7.430)
pH, Ven: 7.359 (ref 7.250–7.430)
pO2, Ven: 28 mmHg — CL (ref 32.0–45.0)
pO2, Ven: 28 mmHg — CL (ref 32.0–45.0)

## 2018-03-20 LAB — BASIC METABOLIC PANEL
Anion gap: 6 (ref 5–15)
BUN: 18 mg/dL (ref 6–20)
CHLORIDE: 109 mmol/L (ref 101–111)
CO2: 26 mmol/L (ref 22–32)
CREATININE: 1.12 mg/dL — AB (ref 0.44–1.00)
Calcium: 7.9 mg/dL — ABNORMAL LOW (ref 8.9–10.3)
GFR, EST AFRICAN AMERICAN: 49 mL/min — AB (ref >60)
GFR, EST NON AFRICAN AMERICAN: 42 mL/min — AB (ref >60)
Glucose, Bld: 99 mg/dL (ref 65–99)
POTASSIUM: 4 mmol/L (ref 3.5–5.1)
SODIUM: 141 mmol/L (ref 135–145)

## 2018-03-20 LAB — CBC
HCT: 31.9 % — ABNORMAL LOW (ref 36.0–46.0)
HEMOGLOBIN: 9.9 g/dL — AB (ref 12.0–15.0)
MCH: 29.8 pg (ref 26.0–34.0)
MCHC: 31 g/dL (ref 30.0–36.0)
MCV: 96.1 fL (ref 78.0–100.0)
PLATELETS: 125 10*3/uL — AB (ref 150–400)
RBC: 3.32 MIL/uL — AB (ref 3.87–5.11)
RDW: 13.5 % (ref 11.5–15.5)
WBC: 7.4 10*3/uL (ref 4.0–10.5)

## 2018-03-20 SURGERY — PACEMAKER IMPLANT

## 2018-03-20 MED ORDER — IOPAMIDOL (ISOVUE-370) INJECTION 76%
INTRAVENOUS | Status: AC
  Filled 2018-03-20: qty 50

## 2018-03-20 MED ORDER — SODIUM CHLORIDE 0.9 % IV SOLN
INTRAVENOUS | Status: AC
  Filled 2018-03-20: qty 2

## 2018-03-20 MED ORDER — ACETAMINOPHEN 325 MG PO TABS
325.0000 mg | ORAL_TABLET | ORAL | Status: DC | PRN
  Administered 2018-03-20 (×2): 325 mg via ORAL
  Filled 2018-03-20 (×2): qty 1

## 2018-03-20 MED ORDER — CEFAZOLIN SODIUM-DEXTROSE 1-4 GM/50ML-% IV SOLN
1.0000 g | Freq: Four times a day (QID) | INTRAVENOUS | Status: AC
  Administered 2018-03-20 – 2018-03-21 (×3): 1 g via INTRAVENOUS
  Filled 2018-03-20 (×5): qty 50

## 2018-03-20 MED ORDER — FUROSEMIDE 10 MG/ML IJ SOLN
INTRAMUSCULAR | Status: DC | PRN
  Administered 2018-03-20: 40 mg via INTRAVENOUS

## 2018-03-20 MED ORDER — NITROGLYCERIN IN D5W 200-5 MCG/ML-% IV SOLN
INTRAVENOUS | Status: AC
  Filled 2018-03-20: qty 250

## 2018-03-20 MED ORDER — FUROSEMIDE 10 MG/ML IJ SOLN
INTRAMUSCULAR | Status: AC
  Filled 2018-03-20: qty 4

## 2018-03-20 MED ORDER — LIDOCAINE HCL 1 % IJ SOLN
INTRAMUSCULAR | Status: AC
  Filled 2018-03-20: qty 60

## 2018-03-20 MED ORDER — CEFAZOLIN SODIUM-DEXTROSE 2-4 GM/100ML-% IV SOLN
INTRAVENOUS | Status: AC
  Filled 2018-03-20: qty 100

## 2018-03-20 MED ORDER — CHLORHEXIDINE GLUCONATE 4 % EX LIQD
CUTANEOUS | Status: AC
  Administered 2018-03-20: 15:00:00
  Filled 2018-03-20: qty 45

## 2018-03-20 MED ORDER — LIDOCAINE HCL 1 % IJ SOLN
INTRAMUSCULAR | Status: AC
  Filled 2018-03-20: qty 20

## 2018-03-20 MED ORDER — NITROGLYCERIN IN D5W 200-5 MCG/ML-% IV SOLN
INTRAVENOUS | Status: AC | PRN
  Administered 2018-03-20: 10 ug/min via INTRAVENOUS

## 2018-03-20 MED ORDER — CEFAZOLIN SODIUM-DEXTROSE 2-4 GM/100ML-% IV SOLN
2.0000 g | INTRAVENOUS | Status: AC
  Administered 2018-03-20: 2 g via INTRAVENOUS
  Filled 2018-03-20: qty 100

## 2018-03-20 MED ORDER — LIDOCAINE HCL (PF) 1 % IJ SOLN
INTRAMUSCULAR | Status: DC | PRN
  Administered 2018-03-20: 10 mL
  Administered 2018-03-20: 45 mL

## 2018-03-20 MED ORDER — HEPARIN (PORCINE) IN NACL 1000-0.9 UT/500ML-% IV SOLN
INTRAVENOUS | Status: AC
  Filled 2018-03-20: qty 500

## 2018-03-20 MED ORDER — IOPAMIDOL (ISOVUE-370) INJECTION 76%
INTRAVENOUS | Status: DC | PRN
  Administered 2018-03-20: 15 mL via INTRAVENOUS

## 2018-03-20 SURGICAL SUPPLY — 13 items
CABLE SURGICAL S-101-97-12 (CABLE) ×3 IMPLANT
CATH BALLN WEDGE 5F 110CM (CATHETERS) ×3 IMPLANT
CATH SWAN GANZ 7F STRAIGHT (CATHETERS) ×3 IMPLANT
KIT MICROPUNCTURE NIT STIFF (SHEATH) ×6 IMPLANT
LEAD TENDRIL MRI 46CM LPA1200M (Lead) ×3 IMPLANT
LEAD TENDRIL MRI 52CM LPA1200M (Lead) ×3 IMPLANT
PACEMAKER ASSURITY DR-RF (Pacemaker) ×3 IMPLANT
PACK CARDIAC CATHETERIZATION (CUSTOM PROCEDURE TRAY) ×3 IMPLANT
PAD DEFIB LIFELINK (PAD) ×3 IMPLANT
SHEATH CLASSIC 8F (SHEATH) ×12 IMPLANT
SHEATH GLIDE SLENDER 4/5FR (SHEATH) ×3 IMPLANT
SHEATH PINNACLE 7F 10CM (SHEATH) ×3 IMPLANT
TRAY PACEMAKER INSERTION (PACKS) ×3 IMPLANT

## 2018-03-20 NOTE — Discharge Instructions (Signed)
Supplemental Discharge Instructions for  Pacemaker/Defibrillator Patients  Activity No heavy lifting or vigorous activity with your left/right arm for 6 to 8 weeks.  Do not raise your left/right arm above your head for one week.  Gradually raise your affected arm as drawn below.           __          03/24/18                    03/25/18                    03/26/18                  03/27/18  NO DRIVING for  1 week   ; you may begin driving on  5/39/76   .  WOUND CARE - Keep the wound area clean and dry.  Do not get this area wet for one week. No showers for one week; you may shower on   03/27/18  . - The tape/steri-strips on your wound will fall off; do not pull them off.  No bandage is needed on the site.  DO  NOT apply any creams, oils, or ointments to the wound area. - If you notice any drainage or discharge from the wound, any swelling or bruising at the site, or you develop a fever > 101? F after you are discharged home, call the office at once.  Special Instructions - You are still able to use cellular telephones; use the ear opposite the side where you have your pacemaker/defibrillator.  Avoid carrying your cellular phone near your device. - When traveling through airports, show security personnel your identification card to avoid being screened in the metal detectors.  Ask the security personnel to use the hand wand. - Avoid arc welding equipment, MRI testing (magnetic resonance imaging), TENS units (transcutaneous nerve stimulators).  Call the office for questions about other devices. - Avoid electrical appliances that are in poor condition or are not properly grounded. - Microwave ovens are safe to be near or to operate.  ACTIVITY AND EXERCISE  Daily activity and exercise are an important part of your recovery. People recover at different rates depending on their general health and type of valve procedure.  Most people recovering from TAVR feel better relatively quickly   No  lifting, pushing, pulling more than 10 pounds (examples to avoid: groceries, vacuuming, gardening, golfing):             - For one week with a procedure through the groin.             - For six weeks for procedures through the chest wall.             - For three months for procedures through the breast-bone. NOTE: You will typically see one of our providers 7-10 days after your procedure to discuss [place] the above activities.    DRIVING  Do not drive for until you are seen for follow up and cleared by a provider.  If you have been told by your doctor in the past that you may not drive, you must talk with him/her before you begin driving again.   DRESSING  Groin site: you may leave the clear dressing over the site for up to one week or until it falls off.   HYGIENE  If you had a femoral (leg) procedure, you may take a shower when  you return home. After the shower, pat the site dry. Do NOT use powder, oils or lotions in your groin area until the site has completely healed.  If you had a chest procedure, you may shower when you return home unless specifically instructed not to by your discharging practitioner.             - DO NOT scrub incision; pat dry with a towel             - DO NOT apply any lotions, oils, powders to the incision             - No tub baths / swimming for at least 2 weeks.  If you notice any fevers, chills, increased pain, swelling, bleeding or pus, please contact your doctor.  ADDITIONAL INFORMATION  If you are going to have an upcoming dental procedure, please contact our office as you will require antibiotics ahead of time to prevent infection on your heart valve.

## 2018-03-20 NOTE — Progress Notes (Signed)
Pt placed on BIPAP due to respiratory distress in the cath lab.  Pt tolerating well at this time.  RT will continue to monitor.

## 2018-03-20 NOTE — Interval H&P Note (Signed)
History and Physical Interval Note:  03/20/2018 3:49 PM  Kaitlin Villarreal  has presented today for surgery, with the diagnosis of hb  The various methods of treatment have been discussed with the patient and family. After consideration of risks, benefits and other options for treatment, the patient has consented to  Procedure(s): PACEMAKER IMPLANT (N/A) RIGHT HEART CATH (N/A) as a surgical intervention .  The patient's history has been reviewed, patient examined, no change in status, stable for surgery.  I have reviewed the patient's chart and labs.  Questions were answered to the patient's satisfaction.    Discussed plan for RHC prior to pacemaker implant by Dr Curt Bears. Pt and husband understand this is being done to evaluate left-to-right shunt from iatrogenic VSD  Sherren Mocha

## 2018-03-20 NOTE — Progress Notes (Signed)
Helped transport patient to cath lab. Vital signs stable throughout. No new changes or complaints.

## 2018-03-20 NOTE — Progress Notes (Addendum)
Electrophysiology Rounding Note  Patient Name: Kaitlin Villarreal Date of Encounter: 03/20/2018  Primary Cardiologist: Bettina Gavia Structural: Burt Knack Electrophysiologist: Curt Bears   Subjective   The patient is doing well today.  At this time, the patient denies chest pain, shortness of breath, or any new concerns.  Inpatient Medications    Scheduled Meds: . acetaminophen  1,000 mg Oral Q6H   Or  . acetaminophen (TYLENOL) oral liquid 160 mg/5 mL  1,000 mg Per Tube Q6H  . amLODipine  2.5 mg Oral Daily  . aspirin EC  81 mg Oral Daily   Or  . aspirin  81 mg Per Tube Daily  . chlorhexidine  60 mL Topical Once  . Chlorhexidine Gluconate Cloth  6 each Topical Daily  . estradiol  0.5 mg Oral Daily  . famotidine  20 mg Oral Daily  . gentamicin irrigation  80 mg Irrigation On Call  . omega-3 acid ethyl esters  1,000 mg Oral Daily  . pravastatin  20 mg Oral QHS  . sodium chloride flush  10-40 mL Intracatheter Q12H  . sodium chloride flush  3 mL Intravenous Q12H   Continuous Infusions: . sodium chloride 50 mL/hr at 03/20/18 0900  . sodium chloride    . sodium chloride    .  ceFAZolin (ANCEF) IV     PRN Meds: ondansetron (ZOFRAN) IV, polyvinyl alcohol, sodium chloride flush, sodium chloride flush, traMADol, white petrolatum   Vital Signs    Vitals:   03/20/18 0830 03/20/18 0900 03/20/18 0930 03/20/18 1000  BP: (!) 115/46 (!) 113/42 (!) 124/38 (!) 120/32  Pulse:      Resp: (!) 24 19 19 18   Temp:      TempSrc:      SpO2:  95% 97% 97%  Weight:      Height:        Intake/Output Summary (Last 24 hours) at 03/20/2018 1009 Last data filed at 03/20/2018 0900 Gross per 24 hour  Intake 1373.34 ml  Output 280 ml  Net 1093.34 ml   Filed Weights   03/18/18 0541 03/20/18 0553  Weight: 120 lb (54.4 kg) 127 lb 6.8 oz (57.8 kg)    Physical Exam    GEN- The patient is elderly appearing, alert and oriented x 3 today.   Head- normocephalic, atraumatic Eyes-  Sclera clear, conjunctiva  pink Ears- hearing intact Oropharynx- clear Neck- supple Lungs- Clear to ausculation bilaterally, normal work of breathing Heart- Regular rate and rhythm  GI- soft, NT, ND, + BS Extremities- no clubbing, cyanosis, or edema Skin- no rash or lesion Psych- euthymic mood, full affect Neuro- strength and sensation are intact  Labs    CBC Recent Labs    03/19/18 0413 03/20/18 0414  WBC 7.6 7.4  HGB 10.4* 9.9*  HCT 32.9* 31.9*  MCV 94.0 96.1  PLT 156 175*   Basic Metabolic Panel Recent Labs    03/19/18 0413 03/20/18 0414  NA 140 141  K 3.9 4.0  CL 108 109  CO2 26 26  GLUCOSE 98 99  BUN 19 18  CREATININE 0.97 1.12*  CALCIUM 8.2* 7.9*  MG 2.0  --     Telemetry    Complete heart block with V pacing (personally reviewed)  Radiology    Dg Chest Port 1 View  Result Date: 03/18/2018 CLINICAL DATA:  Status post TAVR EXAM: PORTABLE CHEST 1 VIEW COMPARISON:  03/14/2018 FINDINGS: Cardiac shadow is at the upper limits of normal in size but accentuated by the portable technique.  Right jugular central line, temporary pacing device and trans aortic valve prosthesis are now seen. Lungs are well aerated bilaterally without focal infiltrate. No acute bony abnormality is noted. IMPRESSION: Tubes and lines as described. Status post TAVR. Electronically Signed   By: Inez Catalina M.D.   On: 03/18/2018 11:06     Patient Profile     Kaitlin Villarreal is a 84 y.o. female admitted for TAVR and subsequent complete heart block  Assessment & Plan    1.  Complete heart block Persistent today Plan for pacemaker implantation Risks, benefits reviewed with patient who wishes to proceed  2.  Severe AS s/p TAVR Per structural team Plan for RHC prior to pacemaker implant to evaluate VSD  3.  HTN Stable No change required today  Signed, Chanetta Marshall, NP  03/20/2018, 10:09 AM   I have seen and examined this patient with Chanetta Marshall.  Agree with above, note added to reflect my findings.  On  exam, RRR, no murmurs, lungs clear.  With continued pacing throughout the night.  Plan for pacemaker implant today.  [name] has presented today for surgery, with the diagnosis of complete AV block.  The various methods of treatment have been discussed with the patient and family. After consideration of risks, benefits and other options for treatment, the patient has consented to  Procedure(s): Pacemaker implant as a surgical intervention .  Risks include but not limited to bleeding, tamponade, infection, pneumothorax, among others. The patient's history has been reviewed, patient examined, no change in status, stable for surgery.  I have reviewed the patient's chart and labs.  Questions were answered to the patient's satisfaction.      Anniyah Mood M. Tevon Berhane MD 03/20/2018 10:50 AM

## 2018-03-20 NOTE — Progress Notes (Signed)
Patient ID: Kaitlin Villarreal, female   DOB: 05-02-1926, 84 y.o.   MRN: [id number] T[facility] Evening Rounds:  Hemodynamically stable Had permanent pacer today and sensing atrium, pacing ventricle at 94 now.  Right heart cath did not show any significant left to right shunt

## 2018-03-20 NOTE — Progress Notes (Addendum)
[place] VALVE TEAM  Patient Name: Kaitlin Villarreal Date of Encounter: 03/20/2018  Primary Cardiologist: Dr. Bettina Gavia / Dr. Burt Knack & Dr. Roxy Manns (TAVR)  Hospital Problem List     Principal Problem:   S/P TAVR (transcatheter aortic valve replacement) Active Problems:   Essential hypertension   Hyperlipidemia   Stage 3 chronic kidney disease (HCC)   Pancreatic mass   Severe aortic stenosis    Subjective   Feeling okay this AM. Awaiting pacemaker placement and RHC today  Inpatient Medications    Scheduled Meds: . acetaminophen  1,000 mg Oral Q6H   Or  . acetaminophen (TYLENOL) oral liquid 160 mg/5 mL  1,000 mg Per Tube Q6H  . amLODipine  2.5 mg Oral Daily  . aspirin EC  81 mg Oral Daily   Or  . aspirin  81 mg Per Tube Daily  . chlorhexidine  60 mL Topical Once  . Chlorhexidine Gluconate Cloth  6 each Topical Daily  . estradiol  0.5 mg Oral Daily  . famotidine  20 mg Oral Daily  . gentamicin irrigation  80 mg Irrigation On Call  . omega-3 acid ethyl esters  1,000 mg Oral Daily  . pravastatin  20 mg Oral QHS  . sodium chloride flush  10-40 mL Intracatheter Q12H  . sodium chloride flush  3 mL Intravenous Q12H   Continuous Infusions: . sodium chloride Stopped (03/20/18 0651)  . sodium chloride    . sodium chloride     PRN Meds: ondansetron (ZOFRAN) IV, polyvinyl alcohol, sodium chloride flush, sodium chloride flush, traMADol, white petrolatum   Vital Signs    Vitals:   03/20/18 0600 03/20/18 0630 03/20/18 0700 03/20/18 0730  BP: (!) 106/39 (!) 100/36 (!) 104/37 (!) 112/39  Pulse:      Resp: 20 (!) 24 18 20   Temp:    98.6 F (37 C)  TempSrc:    Oral  SpO2: 95% 98% 98% 97%  Weight:      Height:        Intake/Output Summary (Last 24 hours) at 03/20/2018 0932 Last data filed at 03/20/2018 0700 Gross per 24 hour  Intake 1293.51 ml  Output 280 ml  Net 1013.51 ml   Filed Weights   03/18/18 0541 03/20/18 0553  Weight: 120 lb  (54.4 kg) 127 lb 6.8 oz (57.8 kg)    Physical Exam    GEN: Well nourished, well developed, in no acute distress.  HEENT: Grossly normal.  Neck: Supple, no JVD, carotid bruits, or masses. Cardiac: RRR, no murmurs, rubs, or gallops. No clubbing, cyanosis, edema.  Radials/DP/PT 2+ and equal bilaterally.  Respiratory:  Respirations regular and unlabored, clear to auscultation bilaterally. GI: Soft, nontender, nondistended, BS + x 4. MS: no deformity or atrophy. Skin: warm and dry, no rash. Groin sites stable Neuro:  Strength and sensation are intact. Psych: AAOx3.  Normal affect.  Labs    CBC Recent Labs    03/19/18 0413 03/20/18 0414  WBC 7.6 7.4  HGB 10.4* 9.9*  HCT 32.9* 31.9*  MCV 94.0 96.1  PLT 156 161*   Basic Metabolic Panel Recent Labs    03/19/18 0413 03/20/18 0414  NA 140 141  K 3.9 4.0  CL 108 109  CO2 26 26  GLUCOSE 98 99  BUN 19 18  CREATININE 0.97 1.12*  CALCIUM 8.2* 7.9*  MG 2.0  --    Liver Function Tests No results for input(s): AST, ALT, ALKPHOS, BILITOT, PROT,  ALBUMIN in the last 72 hours. No results for input(s): LIPASE, AMYLASE in the last 72 hours. Cardiac Enzymes No results for input(s): CKTOTAL, CKMB, CKMBINDEX, TROPONINI in the last 72 hours. BNP Invalid input(s): POCBNP D-Dimer No results for input(s): DDIMER in the last 72 hours. Hemoglobin A1C No results for input(s): HGBA1C in the last 72 hours. Fasting Lipid Panel No results for input(s): CHOL, HDL, LDLCALC, TRIG, CHOLHDL, LDLDIRECT in the last 72 hours. Thyroid Function Tests No results for input(s): TSH, T4TOTAL, T3FREE, THYROIDAB in the last 72 hours.  Invalid input(s): FREET3  Telemetry    pacing - Personally Reviewed  ECG    SR with LAD, LBBB and QRS 140 ms- Personally Reviewed  Radiology    Dg Chest Port 1 View  Result Date: 03/18/2018 CLINICAL DATA:  Status post TAVR EXAM: PORTABLE CHEST 1 VIEW COMPARISON:  03/14/2018 FINDINGS: Cardiac shadow is at the upper  limits of normal in size but accentuated by the portable technique. Right jugular central line, temporary pacing device and trans aortic valve prosthesis are now seen. Lungs are well aerated bilaterally without focal infiltrate. No acute bony abnormality is noted. IMPRESSION: Tubes and lines as described. Status post TAVR. Electronically Signed   By: Inez Catalina M.D.   On: 03/18/2018 11:06    Cardiac Studies   TAVR OPERATIVE NOTE   Date of Procedure:03/18/2018  Preoperative Diagnosis:Severe Aortic Stenosis   Procedure:   Transcatheter Aortic Valve Replacement - PercutaneousRightTransfemoral Approach Edwards Sapien 3 THV (size 50mm, model # 9600TFX, serial # [id number])  Co-Surgeons:Job Holtsclaw H. Roxy Manns, MD and Sherren Mocha, MD  Pre-operative Echo Findings: ? Severe aortic stenosis ? Normalleft ventricular systolic function  Post-operative Echo Findings: ? Noparavalvular leak ? Very small perimembranous ventricular septal defect ? No pericardial effusion ? Normalleft ventricular systolic function  _______________  Limited echo 03/18/18 5 hours later a limited TTE was performed. It showed unchanged small VSD, stable aortic valve with normal unchanged gradients. No paravalvular leak. Trivial pericardial effusion  _______________  Post operative echo 03/19/18 Study Conclusions - Left ventricle: The cavity size was normal. Wall thickness was   increased in a pattern of severe LVH. There was focal basal   hypertrophy. Systolic function was vigorous. The estimated   ejection fraction was in the range of 65% to 70%. Wall motion was   normal; there were no regional wall motion abnormalities. Doppler   parameters are consistent with abnormal left ventricular   relaxation (grade 1 diastolic dysfunction). - Aortic valve: A bioprosthesis was present. - Mitral valve: Mildly calcified annulus. Moderately  thickened,   moderately calcified leaflets . There was mild regurgitation. - Pulmonary arteries: Systolic pressure was moderately increased.   PA peak pressure: 53 mm Hg (S).   Patient Profile     Kaitlin Villarreal is a 84 y.o. female with a history of HTN, HLD, CKD and severe AS who presented to [name] on 03/18/18 for planned TAVR.   Assessment & Plan    Severe AS: s/p successful TAVR with a 23 mm Edwards Sapien 3 THV via the TF approach on 03/18/18. Surgery was c/b a small VSD that remained stable by serial echocardiograms. She also developed CHB and temp wire was left in place. POD1 echo showed EF 65% and a normally functioning TAVR valve with no PVL and mean gradient not reported. Groin sites are stable. Continue ASA and plavix.   VSD: serial echos show stable, small VSD with L--> R shunt. Plan for RHC today to measure right  ventricular saturations.   Complete heart block: she developed CHB without V escape during the procedure. Temp wire was left in place. She then regained AV nodal conduction, but has had intermittent symptomatic heart block. EP has been consulted and will place a pacemaker today  HTN: BP stable. Holding home BB given heart block    Signed, Angelena Form, PA-C  03/20/2018, 9:32 AM  Pager [phone]  I have seen and examined the patient and agree with the assessment and plan as outlined.  Awaiting PPM implantation.  Discussed at length w/ Dr Burt Knack - will do [place] prior to [facility] implant to r/o significant L-R shunt  Rexene Alberts, MD 03/20/2018 10:24 AM

## 2018-03-20 NOTE — H&P (View-Only) (Signed)
[place] VALVE TEAM  Patient Name: Kaitlin Villarreal Date of Encounter: 03/20/2018  Primary Cardiologist: Dr. Bettina Gavia / Dr. Burt Knack & Dr. Roxy Manns (TAVR)  Hospital Problem List     Principal Problem:   S/P TAVR (transcatheter aortic valve replacement) Active Problems:   Essential hypertension   Hyperlipidemia   Stage 3 chronic kidney disease (HCC)   Pancreatic mass   Severe aortic stenosis    Subjective   Feeling okay this AM. Awaiting pacemaker placement and RHC today  Inpatient Medications    Scheduled Meds: . acetaminophen  1,000 mg Oral Q6H   Or  . acetaminophen (TYLENOL) oral liquid 160 mg/5 mL  1,000 mg Per Tube Q6H  . amLODipine  2.5 mg Oral Daily  . aspirin EC  81 mg Oral Daily   Or  . aspirin  81 mg Per Tube Daily  . chlorhexidine  60 mL Topical Once  . Chlorhexidine Gluconate Cloth  6 each Topical Daily  . estradiol  0.5 mg Oral Daily  . famotidine  20 mg Oral Daily  . gentamicin irrigation  80 mg Irrigation On Call  . omega-3 acid ethyl esters  1,000 mg Oral Daily  . pravastatin  20 mg Oral QHS  . sodium chloride flush  10-40 mL Intracatheter Q12H  . sodium chloride flush  3 mL Intravenous Q12H   Continuous Infusions: . sodium chloride Stopped (03/20/18 0651)  . sodium chloride    . sodium chloride     PRN Meds: ondansetron (ZOFRAN) IV, polyvinyl alcohol, sodium chloride flush, sodium chloride flush, traMADol, white petrolatum   Vital Signs    Vitals:   03/20/18 0600 03/20/18 0630 03/20/18 0700 03/20/18 0730  BP: (!) 106/39 (!) 100/36 (!) 104/37 (!) 112/39  Pulse:      Resp: 20 (!) 24 18 20   Temp:    98.6 F (37 C)  TempSrc:    Oral  SpO2: 95% 98% 98% 97%  Weight:      Height:        Intake/Output Summary (Last 24 hours) at 03/20/2018 0932 Last data filed at 03/20/2018 0700 Gross per 24 hour  Intake 1293.51 ml  Output 280 ml  Net 1013.51 ml   Filed Weights   03/18/18 0541 03/20/18 0553  Weight: 120 lb  (54.4 kg) 127 lb 6.8 oz (57.8 kg)    Physical Exam    GEN: Well nourished, well developed, in no acute distress.  HEENT: Grossly normal.  Neck: Supple, no JVD, carotid bruits, or masses. Cardiac: RRR, no murmurs, rubs, or gallops. No clubbing, cyanosis, edema.  Radials/DP/PT 2+ and equal bilaterally.  Respiratory:  Respirations regular and unlabored, clear to auscultation bilaterally. GI: Soft, nontender, nondistended, BS + x 4. MS: no deformity or atrophy. Skin: warm and dry, no rash. Groin sites stable Neuro:  Strength and sensation are intact. Psych: AAOx3.  Normal affect.  Labs    CBC Recent Labs    03/19/18 0413 03/20/18 0414  WBC 7.6 7.4  HGB 10.4* 9.9*  HCT 32.9* 31.9*  MCV 94.0 96.1  PLT 156 638*   Basic Metabolic Panel Recent Labs    03/19/18 0413 03/20/18 0414  NA 140 141  K 3.9 4.0  CL 108 109  CO2 26 26  GLUCOSE 98 99  BUN 19 18  CREATININE 0.97 1.12*  CALCIUM 8.2* 7.9*  MG 2.0  --    Liver Function Tests No results for input(s): AST, ALT, ALKPHOS, BILITOT, PROT,  ALBUMIN in the last 72 hours. No results for input(s): LIPASE, AMYLASE in the last 72 hours. Cardiac Enzymes No results for input(s): CKTOTAL, CKMB, CKMBINDEX, TROPONINI in the last 72 hours. BNP Invalid input(s): POCBNP D-Dimer No results for input(s): DDIMER in the last 72 hours. Hemoglobin A1C No results for input(s): HGBA1C in the last 72 hours. Fasting Lipid Panel No results for input(s): CHOL, HDL, LDLCALC, TRIG, CHOLHDL, LDLDIRECT in the last 72 hours. Thyroid Function Tests No results for input(s): TSH, T4TOTAL, T3FREE, THYROIDAB in the last 72 hours.  Invalid input(s): FREET3  Telemetry    pacing - Personally Reviewed  ECG    SR with LAD, LBBB and QRS 140 ms- Personally Reviewed  Radiology    Dg Chest Port 1 View  Result Date: 03/18/2018 CLINICAL DATA:  Status post TAVR EXAM: PORTABLE CHEST 1 VIEW COMPARISON:  03/14/2018 FINDINGS: Cardiac shadow is at the upper  limits of normal in size but accentuated by the portable technique. Right jugular central line, temporary pacing device and trans aortic valve prosthesis are now seen. Lungs are well aerated bilaterally without focal infiltrate. No acute bony abnormality is noted. IMPRESSION: Tubes and lines as described. Status post TAVR. Electronically Signed   By: Inez Catalina M.D.   On: 03/18/2018 11:06    Cardiac Studies   TAVR OPERATIVE NOTE   Date of Procedure:03/18/2018  Preoperative Diagnosis:Severe Aortic Stenosis   Procedure:   Transcatheter Aortic Valve Replacement - PercutaneousRightTransfemoral Approach Edwards Sapien 3 THV (size 2mm, model # 9600TFX, serial # [id number])  Co-Surgeons:Lucely Leard H. Roxy Manns, MD and Sherren Mocha, MD  Pre-operative Echo Findings: ? Severe aortic stenosis ? Normalleft ventricular systolic function  Post-operative Echo Findings: ? Noparavalvular leak ? Very small perimembranous ventricular septal defect ? No pericardial effusion ? Normalleft ventricular systolic function  _______________  Limited echo 03/18/18 5 hours later a limited TTE was performed. It showed unchanged small VSD, stable aortic valve with normal unchanged gradients. No paravalvular leak. Trivial pericardial effusion  _______________  Post operative echo 03/19/18 Study Conclusions - Left ventricle: The cavity size was normal. Wall thickness was   increased in a pattern of severe LVH. There was focal basal   hypertrophy. Systolic function was vigorous. The estimated   ejection fraction was in the range of 65% to 70%. Wall motion was   normal; there were no regional wall motion abnormalities. Doppler   parameters are consistent with abnormal left ventricular   relaxation (grade 1 diastolic dysfunction). - Aortic valve: A bioprosthesis was present. - Mitral valve: Mildly calcified annulus. Moderately  thickened,   moderately calcified leaflets . There was mild regurgitation. - Pulmonary arteries: Systolic pressure was moderately increased.   PA peak pressure: 53 mm Hg (S).   Patient Profile     Kaitlin Villarreal is a 84 y.o. female with a history of HTN, HLD, CKD and severe AS who presented to [facility] on 03/18/18 for planned TAVR.   Assessment & Plan    Severe AS: s/p successful TAVR with a 23 mm Edwards Sapien 3 THV via the TF approach on 03/18/18. Surgery was c/b a small VSD that remained stable by serial echocardiograms. She also developed CHB and temp wire was left in place. POD1 echo showed EF 65% and a normally functioning TAVR valve with no PVL and mean gradient not reported. Groin sites are stable. Continue ASA and plavix.   VSD: serial echos show stable, small VSD with L--> R shunt. Plan for RHC today to measure right  ventricular saturations.   Complete heart block: she developed CHB without V escape during the procedure. Temp wire was left in place. She then regained AV nodal conduction, but has had intermittent symptomatic heart block. EP has been consulted and will place a pacemaker today  HTN: BP stable. Holding home BB given heart block    Signed, Angelena Form, PA-C  03/20/2018, 9:32 AM  Pager [phone]  I have seen and examined the patient and agree with the assessment and plan as outlined.  Awaiting PPM implantation.  Discussed at length w/ Dr Burt Knack - will do [name] prior to [facility] implant to r/o significant L-R shunt  Rexene Alberts, MD 03/20/2018 10:24 AM

## 2018-03-21 ENCOUNTER — Encounter (HOSPITAL_COMMUNITY): Payer: Self-pay | Admitting: Cardiology

## 2018-03-21 ENCOUNTER — Inpatient Hospital Stay (HOSPITAL_COMMUNITY): Payer: Medicare Other

## 2018-03-21 ENCOUNTER — Other Ambulatory Visit: Payer: Self-pay | Admitting: Physician Assistant

## 2018-03-21 DIAGNOSIS — Q210 Ventricular septal defect: Secondary | ICD-10-CM

## 2018-03-21 LAB — BASIC METABOLIC PANEL
Anion gap: 10 (ref 5–15)
BUN: 22 mg/dL — ABNORMAL HIGH (ref 6–20)
CO2: 21 mmol/L — ABNORMAL LOW (ref 22–32)
CREATININE: 1.19 mg/dL — AB (ref 0.44–1.00)
Calcium: 8.3 mg/dL — ABNORMAL LOW (ref 8.9–10.3)
Chloride: 109 mmol/L (ref 101–111)
GFR calc non Af Amer: 39 mL/min — ABNORMAL LOW (ref >60)
GFR, EST AFRICAN AMERICAN: 46 mL/min — AB (ref >60)
Glucose, Bld: 188 mg/dL — ABNORMAL HIGH (ref 65–99)
POTASSIUM: 4.3 mmol/L (ref 3.5–5.1)
SODIUM: 140 mmol/L (ref 135–145)

## 2018-03-21 LAB — CBC
HCT: 36.5 % (ref 36.0–46.0)
Hemoglobin: 11.4 g/dL — ABNORMAL LOW (ref 12.0–15.0)
MCH: 29.5 pg (ref 26.0–34.0)
MCHC: 31.2 g/dL (ref 30.0–36.0)
MCV: 94.6 fL (ref 78.0–100.0)
Platelets: 103 10*3/uL — ABNORMAL LOW (ref 150–400)
RBC: 3.86 MIL/uL — ABNORMAL LOW (ref 3.87–5.11)
RDW: 13.7 % (ref 11.5–15.5)
WBC: 11.2 10*3/uL — ABNORMAL HIGH (ref 4.0–10.5)

## 2018-03-21 LAB — BRAIN NATRIURETIC PEPTIDE: B Natriuretic Peptide: 693 pg/mL — ABNORMAL HIGH (ref 0.0–100.0)

## 2018-03-21 MED ORDER — LABETALOL HCL 100 MG PO TABS
100.0000 mg | ORAL_TABLET | Freq: Two times a day (BID) | ORAL | Status: DC
  Administered 2018-03-21 – 2018-03-24 (×7): 100 mg via ORAL
  Filled 2018-03-21 (×7): qty 1

## 2018-03-21 MED ORDER — FUROSEMIDE 20 MG PO TABS
20.0000 mg | ORAL_TABLET | Freq: Every day | ORAL | Status: AC
  Administered 2018-03-21 – 2018-03-22 (×2): 20 mg via ORAL
  Filled 2018-03-21 (×2): qty 1

## 2018-03-21 MED FILL — Lidocaine HCl Local Inj 1%: INTRAMUSCULAR | Qty: 40 | Status: AC

## 2018-03-21 MED FILL — Heparin Sod (Porcine)-NaCl IV Soln 1000 Unit/500ML-0.9%: INTRAVENOUS | Qty: 500 | Status: AC

## 2018-03-21 NOTE — Progress Notes (Signed)
  [facility]   MULTIDISCIPLINARY HEART VALVE TEAM  I discussed case with Dr. Roxy Manns and new plan will be to hold plavix until I see her back in the office on 03/27/18 for a limited echo and follow up. If echo shows stable VSD, I will resume plavix at that time.   Angelena Form PA-C  [facility]

## 2018-03-21 NOTE — Progress Notes (Signed)
CARDIAC REHAB PHASE I   PRE:  Rate/Rhythm: 101 ST    BP: sitting 140/52    SaO2: 93 RA  MODE:  Ambulation: 190 ft   POST:  Rate/Rhythm: 114 ST    BP: sitting 72/62, 132/52     SaO2: 92 RA  Pt weak. Needed reminders for pacer restrictions. She wore sling but held to [facility] as she is too weak to go without it. We controlled RW on left side. Pt slowly walked half the unit but c/o weakness and lightheadedness. On return to room BP very low, recheck stable. RN sts she has been doing this. Pt needs PT c/s as she lives with her 69 yr old husband, no other help. Will f/u. [id number]   [place], ACSM 03/21/2018 11:40 AM

## 2018-03-21 NOTE — Progress Notes (Addendum)
[place] VALVE TEAM  Patient Name: Kaitlin Villarreal Date of Encounter: 03/21/2018  Primary Cardiologist: Dr. Bettina Gavia / Dr. Burt Knack & Dr. Roxy Manns (TAVR)  Hospital Problem List     Principal Problem:   S/P TAVR (transcatheter aortic valve replacement) Active Problems:   Essential hypertension   Hyperlipidemia   Stage 3 chronic kidney disease (HCC)   Pancreatic mass   Severe aortic stenosis   VSD (ventricular septal defect)    Subjective   Feeling better today. Her back is bothering her.   Inpatient Medications    Scheduled Meds: . acetaminophen  1,000 mg Oral Q6H   Or  . acetaminophen (TYLENOL) oral liquid 160 mg/5 mL  1,000 mg Per Tube Q6H  . amLODipine  2.5 mg Oral Daily  . aspirin EC  81 mg Oral Daily   Or  . aspirin  81 mg Per Tube Daily  . Chlorhexidine Gluconate Cloth  6 each Topical Daily  . estradiol  0.5 mg Oral Daily  . famotidine  20 mg Oral Daily  . omega-3 acid ethyl esters  1,000 mg Oral Daily  . pravastatin  20 mg Oral QHS  . sodium chloride flush  10-40 mL Intracatheter Q12H   Continuous Infusions: . sodium chloride 50 mL/hr at 03/21/18 0730  .  ceFAZolin (ANCEF) IV 1 g (03/20/18 2333)   PRN Meds: acetaminophen, ondansetron (ZOFRAN) IV, polyvinyl alcohol, sodium chloride flush, traMADol, white petrolatum   Vital Signs    Vitals:   03/21/18 0500 03/21/18 0600 03/21/18 0700 03/21/18 0746  BP: (!) 141/52 (!) 160/66    Pulse:      Resp: (!) 24 (!) 21 (!) 22   Temp:    98.5 F (36.9 C)  TempSrc:    Oral  SpO2: (!) 84% 96% 93%   Weight:  123 lb 0.3 oz (55.8 kg)    Height:        Intake/Output Summary (Last 24 hours) at 03/21/2018 0840 Last data filed at 03/21/2018 0730 Gross per 24 hour  Intake 1198.07 ml  Output 820 ml  Net 378.07 ml   Filed Weights   03/18/18 0541 03/20/18 0553 03/21/18 0600  Weight: 120 lb (54.4 kg) 127 lb 6.8 oz (57.8 kg) 123 lb 0.3 oz (55.8 kg)    Physical Exam    GEN: Well  nourished, well developed, in no acute distress.  HEENT: Grossly normal.  Neck: Supple. Mildly elevated JVD. No carotid bruits, or masses. Cardiac: RRR, no murmurs, rubs, or gallops. No clubbing, cyanosis. Trace bilateral LE edema.  Radials/DP/PT 2+ and equal bilaterally.  Respiratory:  Respirations regular and unlabored, clear to auscultation bilaterally. GI: Soft, nontender, nondistended, BS + x 4. MS: no deformity or atrophy. Skin: warm and dry, no rash. Groin sites stable Neuro:  Strength and sensation are intact. Psych: AAOx3.  Normal affect.  Labs    CBC Recent Labs    03/19/18 0413 03/20/18 0414  WBC 7.6 7.4  HGB 10.4* 9.9*  HCT 32.9* 31.9*  MCV 94.0 96.1  PLT 156 371*   Basic Metabolic Panel Recent Labs    03/19/18 0413 03/20/18 0414  NA 140 141  K 3.9 4.0  CL 108 109  CO2 26 26  GLUCOSE 98 99  BUN 19 18  CREATININE 0.97 1.12*  CALCIUM 8.2* 7.9*  MG 2.0  --    Liver Function Tests No results for input(s): AST, ALT, ALKPHOS, BILITOT, PROT, ALBUMIN in the last 72 hours.  No results for input(s): LIPASE, AMYLASE in the last 72 hours. Cardiac Enzymes No results for input(s): CKTOTAL, CKMB, CKMBINDEX, TROPONINI in the last 72 hours. BNP Invalid input(s): POCBNP D-Dimer No results for input(s): DDIMER in the last 72 hours. Hemoglobin A1C No results for input(s): HGBA1C in the last 72 hours. Fasting Lipid Panel No results for input(s): CHOL, HDL, LDLCALC, TRIG, CHOLHDL, LDLDIRECT in the last 72 hours. Thyroid Function Tests No results for input(s): TSH, T4TOTAL, T3FREE, THYROIDAB in the last 72 hours.  Invalid input(s): FREET3  Telemetry    Pacing atrial tracking, HR 100-110s - Personally Reviewed  ECG    V pacing -Personally Reviewed  Radiology    Dg Chest 2 View  Result Date: 03/21/2018 CLINICAL DATA: Cardiac device in situ. EXAM: CHEST - 2 VIEW COMPARISON:  03/18/2018. FINDINGS: Interim removal of temporary pacer. Interim placement of cardiac  pacer with lead tips over the right atrium right ventricle. Prior cardiac valve replacement. Heart size normal. Bibasilar atelectasis/infiltrates and small bilateral pleural effusions. No pneumothorax. IMPRESSION: 1. Interim removal of temporary pacer. Interim placement of cardiac pacer with lead tips in right atrium right ventricle. 2. Bibasilar atelectasis/infiltrates and small bilateral pleural effusions noted on today's exam. 3.  Prior cardiac valve replacement.  Heart size stable. Electronically Signed   By: Marcello Moores  Register   On: 03/21/2018 07:16    Cardiac Studies   TAVR OPERATIVE NOTE   Date of Procedure:03/18/2018  Preoperative Diagnosis:Severe Aortic Stenosis   Procedure:   Transcatheter Aortic Valve Replacement - PercutaneousRightTransfemoral Approach Edwards Sapien 3 THV (size 53mm, model # 9600TFX, serial # [id number])  Co-Surgeons:Francesca Strome H. Roxy Manns, MD and Sherren Mocha, MD  Pre-operative Echo Findings: ? Severe aortic stenosis ? Normalleft ventricular systolic function  Post-operative Echo Findings: ? Noparavalvular leak ? Very small perimembranous ventricular septal defect ? No pericardial effusion ? Normalleft ventricular systolic function  _______________  Limited echo 03/18/18 5 hours later a limited TTE was performed. It showed unchanged small VSD, stable aortic valve with normal unchanged gradients. No paravalvular leak. Trivial pericardial effusion  _______________  Post operative echo 03/19/18 Study Conclusions - Left ventricle: The cavity size was normal. Wall thickness was   increased in a pattern of severe LVH. There was focal basal   hypertrophy. Systolic function was vigorous. The estimated   ejection fraction was in the range of 65% to 70%. Wall motion was   normal; there were no regional wall motion abnormalities. Doppler   parameters are consistent with abnormal  left ventricular   relaxation (grade 1 diastolic dysfunction). - Aortic valve: A bioprosthesis was present. - Mitral valve: Mildly calcified annulus. Moderately thickened,   moderately calcified leaflets . There was mild regurgitation. - Pulmonary arteries: Systolic pressure was moderately increased.   PA peak pressure: 53 mm Hg (S).  _______________  03/21/18 RIGHT HEART CATH  Conclusion   1.  No significant left to right shunting based on absence of a significant O2 step up from the SVC to the pulmonary artery 2.  Elevated right and left heart intracardiac filling pressures consistent with acute on chronic diastolic heart failure  Plan: Serial echo follow-up of the patient's small perimembranous VSD, IV diuresis and medical therapy for treatment of congestive heart failure, permanent pacemaker placement per Dr. Curt Bears to follow    ___________________   03/20/18 SURGEON:  Allegra Lai, MD     PREPROCEDURE DIAGNOSIS:  Complete AV block    POSTPROCEDURE DIAGNOSIS:  Complete AV block     PROCEDURES:  1. Pacemaker implantation.  2. Left upper extremity venography.      Patient Profile     Kaitlin Villarreal is a 84 y.o. female with a history of HTN, HLD, CKD and severe AS who presented to [facility] on 03/18/18 for planned TAVR.   Assessment & Plan    Severe AS: s/p successful TAVR with a 23 mm Edwards Sapien 3 THV via the TF approach on 03/18/18. Surgery was c/b a small VSD that remained stable by serial echocardiograms. She also developed CHB and temp wire was left in place. POD1 echo showed EF 65% and a normally functioning TAVR valve with no PVL and mean gradient not reported. Groin sites are stable. Continue ASA and plavix.   VSD: after valve deployment a very small perimembranous ventricular septal defect was noted and confirmed with intraoperative TEE. Serial echos have shown a stable, small VSD with L--> R shunt. [place] 03/20/18 did not show any significant L--> R shunting. We will  follow with serial echos.   Complete heart block: she developed CHB without V escape during the procedure and had persistent intermittent CHB. She underwent [employer] MRI dual-chamber pacemaker by Dr. Curt Bears on 03/20/18. CXR with no PTX and device functioning normally.   Acute on chronic diastolic CHF: she had a brief episode of pulmonary edema during pacemaker placement. This was treated with BiPap and IV lasix. CXR today shows small bilateral pleural effusions. She has po lasix 20mg  ordered for today and tomorrow.   HTN: BP elevated. Will resume home BB now that she has a pacemaker. She is currently pacing at 110bpm, so hopefully this will help lower HR as well.   Dispo: will transfer to [facility] and hopeful discharge home this weekend. Cardiac rehab recommended a PT consult given weakness and deconditioning. I will order that now.    Signed, Angelena Form, PA-C  03/21/2018, 8:40 AM  Pager [phone]  I have seen and examined the patient and agree with the assessment and plan as outlined.  Doing much better since pacer.  Still needs diuresis.  Mobilize.  Transfer [facility].  Possible d/c home 1-2 days.  Rexene Alberts, MD 03/21/2018

## 2018-03-21 NOTE — Discharge Summary (Signed)
[place] VALVE TEAM   Discharge Summary    Patient ID: Kaitlin Villarreal,  MRN: [id number], DOB/AGE: 03/21/1926 84 y.o.  Admit date: 03/18/2018 Discharge date: 03/24/2018  Primary Care Provider: Jacqlyn Larsen II Primary Cardiologist: Dr. Bettina Gavia / Dr. Burt Knack & Dr. Roxy Manns (TAVR)   Discharge Diagnoses    Principal Problem:   S/P TAVR (transcatheter aortic valve replacement) Active Problems:   Essential hypertension   Hyperlipidemia   Stage 3 chronic kidney disease (HCC)   Pancreatic mass   Severe aortic stenosis   Acute on chronic diastolic heart failure (HCC)   VSD (ventricular septal defect)   Allergies Allergies  Allergen Reactions  . Levofloxacin Other (See Comments)    Hallucinations  . Azithromycin Other (See Comments)    Unknown  . Clarithromycin Other (See Comments)    Felt like body was swollen, felt awful  . Latex Rash and Other (See Comments)    Causes sores     History of Present Illness     Kaitlin Villarreal a 84 y.o.femalewith a history of HTN, HLD, CKD and severe AS who presented to [facility] on [facility]18[facility]18 for planned TAVR.  The patient states that she was first noted to have a heart murmur on routine physical examination by her gynecologist nearly 20 years ago. She was eventually referred for cardiology evaluation and has been followed for several years by Dr. Bettina Gavia. She has remained reasonably active physically despite her advanced age, although she has slowed down considerably over the last few years. She has developed exertional shortness of breath and fatigue,some of which the patient has blamed on decreased activity because of problems with her back. She underwent a routine transthoracic echocardiogram on December 23, 2017 at [facility]. By report this echocardiogram revealed severe aortic stenosis with normal left ventricular systolic function. Ejection fraction was estimated greater than 70%. Peak velocity  across aortic valve was reported 4.1 m/s with peak and mean transvalvular gradients estimated 69 and 42 mmHg respectively. Aortic valve area was calculated 0.87cm. Exercise stress echocardiogram was performed January 08, 2018. This confirmed the presence of severe aortic stenosis associated with hypertensive response to stress. Peak velocity across aortic valve at rest was measured 4.3 m/s corresponding to mean transvalvular gradient estimated 40 mmHg. The DVI was 0.24with aortic valve area calculated 0.76 cm. The patient was referred to Dr. Burt Knack and underwent diagnostic cardiac catheterization Feb 06, 2018. She was found to have moderate nonobstructive coronary artery disease with normal right heart hemodynamics and preserved cardiac output.   She was evaluated by the multidisciplinary valve team and felt to be a suitable candidate for TAVR, which was set up for 03/18/18.  Hospital Course     Consultants: none  Severe AS: s/p successful TAVR with a20mm Edwards Sapien 3 THV via the TF approach on 03/18/18.Surgery was c/b a small VSD that remained stable by serial echocardiograms. She also developed CHB requiring pacemaker placement. POD1 echo showed EF 65% and a normally functioning TAVR valve with no PVL and mean gradient not reported. Groin sites are stable. Continue ASA alone. Will continue to hold plavix at discharge and resume at 1 week appointment if limited echo show stable VSD. She will be seen back in the office this Thursday.  VSD: after valve deployment a very small perimembranous ventricular septal defect was noted and confirmed with intraoperative TEE. Serial echos have shown a stable, small VSD. [name] 03/20/18 did not show any significant L--> R  shunting. An outpatient limited echo has been arranged for 03/27/17.   Complete heart block: she developed CHB without V escape during the procedure and had intermittent symptomatic CHB postoperatively. She underwent [employer] MRI dual-chamber pacemaker by Dr. Curt Bears on 03/20/18. She has a small pocket hematoma that has been evaluated as stable by EP.  Acute on chronic diastolic CHF: she had pulmonary edema during pacemaker placement. She had bilateral pleural effusions and an elevated BNP. This was treated with BiPap and IV lasix. She now appears euvolemic.   HTN: BP has been elevated. I will increase home Norvasc from 2.5mg  daily to 5mg  daily.   Dispo: will send home today with home health PT.  The patient has had an uncomplicated hospital course and is recovering well. The femoral catheter sites are stable. She has been seen by Dr. Roxy Manns today and deemed ready for discharge home. All follow-up appointments have been scheduled. Discharge medications are listed below.  _____________  Discharge Vitals Blood pressure (!) 146/55, pulse 85, temperature 98.5 F (36.9 C), temperature source Oral, resp. rate 20, height 5' (1.524 m), weight 118 lb (53.5 kg), SpO2 90 %.  Filed Weights   03/21/18 0600 03/23/18 0600 03/24/18 0637  Weight: 123 lb 0.3 oz (55.8 kg) 121 lb 9.6 oz (55.2 kg) 118 lb (53.5 kg)   VS:  BP (!) 146/55 (BP Location: Right Arm)   Pulse 85   Temp 98.5 F (36.9 C) (Oral)   Resp 20   Ht 5' (1.524 m)   Wt 118 lb (53.5 kg)   SpO2 90%   BMI 23.05 kg/m    GEN: thin, elderly white female HEENT: normal Neck: no JVD, carotid bruits, or masses Cardiac: RRR; no murmurs, rubs, or gallops,no edema  Respiratory:  clear to auscultation bilaterally, normal work of breathing GI: soft, nontender, nondistended, + BS MS: no deformity or atrophy Skin: warm and dry, no rash Neuro:  Alert and Oriented x 3, Strength and sensation are intact Psych: euthymic mood, full affect    Labs & Radiologic Studies     CBC Recent Labs    03/22/18 0337 03/23/18 0849  WBC 8.8 8.3  HGB 8.5* 9.6*  HCT 26.6* 30.5*  MCV 94.3 94.7  PLT 95* 409*   Basic Metabolic Panel Recent Labs    03/22/18 0337  03/23/18 0849  NA 141 142  K 3.5 4.1  CL 112* 107  CO2 24 28  GLUCOSE 121* 160*  BUN 24* 23*  CREATININE 1.07* 1.22*  CALCIUM 7.9* 8.7*   Liver Function Tests No results for input(s): AST, ALT, ALKPHOS, BILITOT, PROT, ALBUMIN in the last 72 hours. No results for input(s): LIPASE, AMYLASE in the last 72 hours. Cardiac Enzymes No results for input(s): CKTOTAL, CKMB, CKMBINDEX, TROPONINI in the last 72 hours. BNP Invalid input(s): POCBNP D-Dimer No results for input(s): DDIMER in the last 72 hours. Hemoglobin A1C No results for input(s): HGBA1C in the last 72 hours. Fasting Lipid Panel No results for input(s): CHOL, HDL, LDLCALC, TRIG, CHOLHDL, LDLDIRECT in the last 72 hours. Thyroid Function Tests No results for input(s): TSH, T4TOTAL, T3FREE, THYROIDAB in the last 72 hours.  Invalid input(s): FREET3  Dg Chest 2 View  Result Date: 03/21/2018 CLINICAL DATA: Cardiac device in situ. EXAM: CHEST - 2 VIEW COMPARISON:  03/18/2018. FINDINGS: Interim removal of temporary pacer. Interim placement of cardiac pacer with lead tips over the right atrium right ventricle. Prior cardiac valve replacement. Heart size normal. Bibasilar atelectasis/infiltrates and  small bilateral pleural effusions. No pneumothorax. IMPRESSION: 1. Interim removal of temporary pacer. Interim placement of cardiac pacer with lead tips in right atrium right ventricle. 2. Bibasilar atelectasis/infiltrates and small bilateral pleural effusions noted on today's exam. 3.  Prior cardiac valve replacement.  Heart size stable. Electronically Signed   By: Marcello Moores  Register   On: 03/21/2018 07:16   Dg Chest 2 View  Result Date: 03/14/2018 CLINICAL DATA:  Pre-admission for heart valve replacement. Shortness of breath today. EXAM: CHEST - 2 VIEW COMPARISON:  Chest x-ray dated 01/29/2018. FINDINGS: Heart size is upper normal, stable. Overall cardiomediastinal silhouette is stable. Atherosclerotic changes again noted at the aortic arch.  Lungs are clear. No pleural effusion or pneumothorax seen. Mild degenerative spurring within the kyphotic thoracic spine. No acute or suspicious osseous finding. IMPRESSION: 1. No active cardiopulmonary disease. No evidence of pneumonia or pulmonary edema. 2. Aortic atherosclerosis. Electronically Signed   By: Franki Cabot M.D.   On: 03/14/2018 13:53   Dg Chest Port 1 View  Result Date: 03/18/2018 CLINICAL DATA:  Status post TAVR EXAM: PORTABLE CHEST 1 VIEW COMPARISON:  03/14/2018 FINDINGS: Cardiac shadow is at the upper limits of normal in size but accentuated by the portable technique. Right jugular central line, temporary pacing device and trans aortic valve prosthesis are now seen. Lungs are well aerated bilaterally without focal infiltrate. No acute bony abnormality is noted. IMPRESSION: Tubes and lines as described. Status post TAVR. Electronically Signed   By: Inez Catalina M.D.   On: 03/18/2018 11:06     Diagnostic Studies/Procedures    TAVR OPERATIVE NOTE   Date of Procedure:03/18/2018  Preoperative Diagnosis:Severe Aortic Stenosis   Procedure:   Transcatheter Aortic Valve Replacement - PercutaneousRightTransfemoral Approach Edwards Sapien 3 THV (size 71mm, model # 9600TFX, serial # [id number])  Co-Surgeons:Clarence H. Roxy Manns, MD and Sherren Mocha, MD  Pre-operative Echo Findings: ? Severe aortic stenosis ? Normalleft ventricular systolic function  Post-operative Echo Findings: ? Noparavalvular leak ? Very small perimembranous ventricular septal defect ? No pericardial effusion ? Normalleft ventricular systolic function  _______________  Limited echo 03/18/18 5 hours later a limited TTE was performed. It showed unchanged small VSD, stable aortic valve with normal unchanged gradients. No paravalvular leak. Trivial pericardial effusion  _______________  Post operative echo 03/19/18 Study  Conclusions - Left ventricle: The cavity size was normal. Wall thickness was increased in a pattern of severe LVH. There was focal basal hypertrophy. Systolic function was vigorous. The estimated ejection fraction was in the range of 65% to 70%. Wall motion was normal; there were no regional wall motion abnormalities. Doppler parameters are consistent with abnormal left ventricular relaxation (grade 1 diastolic dysfunction). - Aortic valve: A bioprosthesis was present. - Mitral valve: Mildly calcified annulus. Moderately thickened, moderately calcified leaflets . There was mild regurgitation. - Pulmonary arteries: Systolic pressure was moderately increased. PA peak pressure: 53 mm Hg (S).  _______________  03/21/18 RIGHT HEART CATH  Conclusion   1. No significant left to right shunting based on absence of a significant O2 step up from the SVC to the pulmonary artery 2. Elevated right and left heart intracardiac filling pressures consistent with acute on chronic diastolic heart failure  Plan: Serial echo follow-up of the patient's small perimembranous VSD, IV diuresis and medical therapy for treatment of congestive heart failure, permanent pacemaker placement per Dr. Curt Bears to follow    _______________   03/20/18 SURGEON: Allegra Lai, MD   PREPROCEDURE DIAGNOSIS:Complete AV block  POSTPROCEDURE DIAGNOSIS:Complete AV block  PROCEDURES:  1. Pacemaker implantation. 2. Left upper extremity venography.       Disposition   Pt is being discharged home today in good condition.  Follow-up Plans & Appointments    Follow-up Information    Eileen Stanford, PA-C Follow up on 03/27/2018.   Specialties:  Cardiology, Radiology Why:  @ 2pm for a repeat echo and then an appointment with Bonney Leitz at 3:30pm Contact information: [address] [place] [address] [phone]        [employer] [facility]  Follow up on 04/01/2018.   Specialty:  Cardiology Why:  at [facility] information: [address] [place]       Constance Haw, MD Follow up on 06/20/2018.   Specialty:  Cardiology Why:  at 12noon Contact information: [place] [address] [phone]            Discharge Medications     Medication List    TAKE these medications   acetaminophen 650 MG CR tablet Commonly known as:  TYLENOL Take 650-1,300 mg by mouth every 8 (eight) hours as needed for pain.   amLODipine 5 MG tablet Commonly known as:  NORVASC Take 1 tablet (5 mg total) by mouth daily. What changed:    medication strength  how much to take   aspirin EC 81 MG tablet Take 81 mg by mouth daily.   BENEFIBER DRINK MIX PO Take 1 packet by mouth daily. 1 tablespoonful daily in the morning   BLUE-EMU SUPER STRENGTH EX Apply 1 application topically 3 (three) times daily as needed (for neck pain.).   [employer] PROBIOTIC Chew Chew 2 each by mouth daily after supper.   estradiol 1 MG tablet Commonly known as:  ESTRACE Take 0.5 mg by mouth daily.   Fish Oil 1000 MG Caps Take 1,000 mg by mouth daily.   labetalol 100 MG tablet Commonly known as:  NORMODYNE Take 100 mg by mouth 2 (two) times daily.   pravastatin 20 MG tablet Commonly known as:  PRAVACHOL Take 20 mg by mouth at bedtime.   ranitidine 300 MG tablet Commonly known as:  ZANTAC Take 300 mg by mouth at bedtime.   SYSTANE OP Place 1 drop into both eyes daily as needed (for dry eyes).         Outstanding Labs/Studies   BMET  Duration of Discharge Encounter   Greater than 30 minutes including physician time.  Signed, Angelena Form PA-C 03/24/2018, 9:05 AM

## 2018-03-21 NOTE — Progress Notes (Addendum)
Electrophysiology Rounding Note  Patient Name: Kaitlin Villarreal Date of Encounter: 03/21/2018  Primary Cardiologist: Bettina Gavia Structural: Burt Knack Electrophysiologist: Curt Bears    Subjective   The patient is doing well today.  At this time, the patient denies chest pain, shortness of breath, or any new concerns.  Breathing is improved from last night.    Inpatient Medications    Scheduled Meds: . acetaminophen  1,000 mg Oral Q6H   Or  . acetaminophen (TYLENOL) oral liquid 160 mg/5 mL  1,000 mg Per Tube Q6H  . amLODipine  2.5 mg Oral Daily  . aspirin EC  81 mg Oral Daily   Or  . aspirin  81 mg Per Tube Daily  . Chlorhexidine Gluconate Cloth  6 each Topical Daily  . estradiol  0.5 mg Oral Daily  . famotidine  20 mg Oral Daily  . omega-3 acid ethyl esters  1,000 mg Oral Daily  . pravastatin  20 mg Oral QHS  . sodium chloride flush  10-40 mL Intracatheter Q12H   Continuous Infusions: . sodium chloride 50 mL/hr at 03/21/18 0730  .  ceFAZolin (ANCEF) IV 1 g (03/20/18 2333)   PRN Meds: acetaminophen, ondansetron (ZOFRAN) IV, polyvinyl alcohol, sodium chloride flush, traMADol, white petrolatum   Vital Signs    Vitals:   03/21/18 0500 03/21/18 0600 03/21/18 0700 03/21/18 0746  BP: (!) 141/52 (!) 160/66    Pulse:      Resp: (!) 24 (!) 21 (!) 22   Temp:    98.5 F (36.9 C)  TempSrc:    Oral  SpO2: (!) 84% 96% 93%   Weight:  123 lb 0.3 oz (55.8 kg)    Height:        Intake/Output Summary (Last 24 hours) at 03/21/2018 0757 Last data filed at 03/21/2018 0730 Gross per 24 hour  Intake 1229.73 ml  Output 820 ml  Net 409.73 ml   Filed Weights   03/18/18 0541 03/20/18 0553 03/21/18 0600  Weight: 120 lb (54.4 kg) 127 lb 6.8 oz (57.8 kg) 123 lb 0.3 oz (55.8 kg)    Physical Exam    GEN- The patient is elderly appearing, alert and oriented x 3 today.   Head- normocephalic, atraumatic Eyes-  Sclera clear, conjunctiva pink Ears- hearing intact Oropharynx- clear Neck-  supple Lungs- Clear to ausculation bilaterally, normal work of breathing Heart- Regular rate and rhythm (paced) GI- soft, NT, ND, + BS Extremities- no clubbing, cyanosis, or edema Skin- no rash or lesion Psych- euthymic mood, full affect Neuro- strength and sensation are intact  Labs    CBC Recent Labs    03/19/18 0413 03/20/18 0414  WBC 7.6 7.4  HGB 10.4* 9.9*  HCT 32.9* 31.9*  MCV 94.0 96.1  PLT 156 983*   Basic Metabolic Panel Recent Labs    03/19/18 0413 03/20/18 0414  NA 140 141  K 3.9 4.0  CL 108 109  CO2 26 26  GLUCOSE 98 99  BUN 19 18  CREATININE 0.97 1.12*  CALCIUM 8.2* 7.9*  MG 2.0  --     Telemetry    Sinus rhythm with V pacing (personally reviewed)  Radiology    Dg Chest 2 View  Result Date: 03/21/2018 CLINICAL DATA: Cardiac device in situ. EXAM: CHEST - 2 VIEW COMPARISON:  03/18/2018. FINDINGS: Interim removal of temporary pacer. Interim placement of cardiac pacer with lead tips over the right atrium right ventricle. Prior cardiac valve replacement. Heart size normal. Bibasilar atelectasis/infiltrates and small bilateral pleural  effusions. No pneumothorax. IMPRESSION: 1. Interim removal of temporary pacer. Interim placement of cardiac pacer with lead tips in right atrium right ventricle. 2. Bibasilar atelectasis/infiltrates and small bilateral pleural effusions noted on today's exam. 3.  Prior cardiac valve replacement.  Heart size stable. Electronically Signed   By: Marcello Moores  Register   On: 03/21/2018 07:16    Assessment & Plan    1.  Complete heart block Doing well s/p pacemaker CXR without ptx Device interrogation reviewed and normal Routine wound care and follow up  2.  Severe AS s/p TAVR Per structural team  Electrophysiology team to see as needed while here. Please call with questions.  Signed, Chanetta Marshall, NP  03/21/2018, 7:57 AM   I have seen and examined this patient with Chanetta Marshall.  Agree with above, note added to reflect my  findings.  On exam, RRR, no murmurs, lungs clear.  Patient with complete heart block status post [facility] dual-chamber pacemaker.  Device function and interrogation without issue.  Plan for follow-up in device clinic.   Will M. Camnitz MD 03/21/2018 9:20 AM

## 2018-03-22 DIAGNOSIS — I5033 Acute on chronic diastolic (congestive) heart failure: Secondary | ICD-10-CM

## 2018-03-22 LAB — BASIC METABOLIC PANEL
Anion gap: 5 (ref 5–15)
BUN: 24 mg/dL — AB (ref 6–20)
CHLORIDE: 112 mmol/L — AB (ref 101–111)
CO2: 24 mmol/L (ref 22–32)
Calcium: 7.9 mg/dL — ABNORMAL LOW (ref 8.9–10.3)
Creatinine, Ser: 1.07 mg/dL — ABNORMAL HIGH (ref 0.44–1.00)
GFR calc Af Amer: 52 mL/min — ABNORMAL LOW (ref >60)
GFR calc non Af Amer: 45 mL/min — ABNORMAL LOW (ref >60)
GLUCOSE: 121 mg/dL — AB (ref 65–99)
Potassium: 3.5 mmol/L (ref 3.5–5.1)
Sodium: 141 mmol/L (ref 135–145)

## 2018-03-22 LAB — CBC
HCT: 26.6 % — ABNORMAL LOW (ref 36.0–46.0)
HEMOGLOBIN: 8.5 g/dL — AB (ref 12.0–15.0)
MCH: 30.1 pg (ref 26.0–34.0)
MCHC: 32 g/dL (ref 30.0–36.0)
MCV: 94.3 fL (ref 78.0–100.0)
Platelets: 95 10*3/uL — ABNORMAL LOW (ref 150–400)
RBC: 2.82 MIL/uL — ABNORMAL LOW (ref 3.87–5.11)
RDW: 13.7 % (ref 11.5–15.5)
WBC: 8.8 10*3/uL (ref 4.0–10.5)

## 2018-03-22 MED ORDER — POTASSIUM CHLORIDE CRYS ER 20 MEQ PO TBCR
40.0000 meq | EXTENDED_RELEASE_TABLET | Freq: Three times a day (TID) | ORAL | Status: AC
  Administered 2018-03-22 – 2018-03-24 (×6): 40 meq via ORAL
  Filled 2018-03-22 (×6): qty 2

## 2018-03-22 MED ORDER — FUROSEMIDE 10 MG/ML IJ SOLN
20.0000 mg | Freq: Four times a day (QID) | INTRAMUSCULAR | Status: AC
  Administered 2018-03-22 (×3): 20 mg via INTRAVENOUS
  Filled 2018-03-22 (×4): qty 2

## 2018-03-22 NOTE — Progress Notes (Addendum)
      [address]       [place] [address]             [phone]      2 Days Post-Op Procedure(s) (LRB): PACEMAKER IMPLANT (N/A) RIGHT HEART CATH (N/A)   Subjective:  States she is doing okay.  She states she has been through a lot over the past several days.  Her husband is at bedside and states she attempted to work with PT but gave out.  They were going to come later today and work with her again.  Objective: Vital signs in last 24 hours: Temp:  [97.4 F (36.3 C)-97.7 F (36.5 C)] 97.4 F (36.3 C) (06/22 0600) Pulse Rate:  [72-95] 72 (06/22 0600) Cardiac Rhythm: Normal sinus rhythm;Ventricular paced;Bundle branch block (06/21 1901) Resp:  [18-27] 22 (06/22 0600) BP: (107-148)/(33-69) 130/44 (06/22 0600) SpO2:  [82 %-97 %] 95 % (06/22 0600)  Hemodynamic parameters for last 24 hours:    Intake/Output from previous day: 06/21 0701 - 06/22 0700 In: 2136.3 [P.O.:900; I.V.:986.3; IV Piggyback:250] Out: -  Intake/Output this shift: No intake/output data recorded.  General appearance: alert, cooperative and no distress Heart: regular rate and rhythm Lungs: clear to auscultation bilaterally Abdomen: soft, non-tender; bowel sounds normal; no masses,  no organomegaly Wound: PPM pocket with ecchymotic with small hematoma present  Lab Results: Recent Labs    03/21/18 0857 03/22/18 0337  WBC 11.2* 8.8  HGB 11.4* 8.5*  HCT 36.5 26.6*  PLT 103* 95*   BMET:  Recent Labs    03/21/18 0857 03/22/18 0337  NA 140 141  K 4.3 3.5  CL 109 112*  CO2 21* 24  GLUCOSE 188* 121*  BUN 22* 24*  CREATININE 1.19* 1.07*  CALCIUM 8.3* 7.9*    PT/INR: No results for input(s): LABPROT, INR in the last 72 hours. ABG    Component Value Date/Time   PHART 7.420 03/14/2018 1059   HCO3 18.9 (L) 03/20/2018 1649   TCO2 20 (L) 03/20/2018 1649   ACIDBASEDEF 6.0 (H) 03/20/2018 1649   O2SAT 49.0 03/20/2018 1649   CBG (last 3)  No results for input(s): GLUCAP in the  last 72 hours.  Assessment/Plan: S/P Procedure(s) (LRB): PACEMAKER IMPLANT (N/A) RIGHT HEART CATH (N/A)  1. Severe AS, S/P TAVR- developed CHB post operatively PPM has been placed- remains hemodynamically stable on current medication regimen 2. Post operative anemia, mild Hgb has dropped from 11.4 to 8.5, she does have a hematoma at [facility] site, patient instructed to keep arm in sling and try and not move it arround 3. Thrombocytopenia- platelets down to 95, not on Lovenox, will monitor 4. Deconditioning- awaiting formal PT recommendations 5. Dispo- patient stable, 3 point drop in hemoglobin this morning, could be related to hematoma at [facility] site, will discuss patient with Dr. Roxy Manns   LOS: 4 days    Ellwood Handler 03/22/2018    I have seen and examined the patient and agree with the assessment and plan as outlined.  Acute blood loss anemia likely due to PPM insertion.  Still dyspneic with activity and requiring O2 - not ready for hospital d/c.  Stop IV fluids (not sure why she's receiving) and increase lasix.  Recheck Hgb/Hct tomorrow.  Supplement potassium.  Mobilize  Rexene Alberts, MD 03/22/2018 10:27 AM

## 2018-03-22 NOTE — Progress Notes (Signed)
Progress Note  Patient Name: Kaitlin Villarreal Date of Encounter: 03/22/2018  Primary Cardiologist: Shirlee More, MD   Subjective   Shortness of breath, no chest pain.  Fatigue.  Inpatient Medications    Scheduled Meds: . acetaminophen  1,000 mg Oral Q6H   Or  . acetaminophen (TYLENOL) oral liquid 160 mg/5 mL  1,000 mg Per Tube Q6H  . amLODipine  2.5 mg Oral Daily  . aspirin EC  81 mg Oral Daily  . Chlorhexidine Gluconate Cloth  6 each Topical Daily  . estradiol  0.5 mg Oral Daily  . famotidine  20 mg Oral Daily  . furosemide  20 mg Intravenous Q6H  . labetalol  100 mg Oral BID  . omega-3 acid ethyl esters  1,000 mg Oral Daily  . potassium chloride  40 mEq Oral TID WC  . pravastatin  20 mg Oral QHS  . sodium chloride flush  10-40 mL Intracatheter Q12H   Continuous Infusions:  PRN Meds: acetaminophen, ondansetron (ZOFRAN) IV, polyvinyl alcohol, sodium chloride flush, traMADol, white petrolatum   Vital Signs    Vitals:   03/21/18 2009 03/22/18 0100 03/22/18 0117 03/22/18 0600  BP: 117/69 (!) 130/49  (!) 130/44  Pulse: 93 95  72  Resp: (!) 27 (!) 25  (!) 22  Temp: (!) 97.4 F (36.3 C) 97.7 F (36.5 C)  (!) 97.4 F (36.3 C)  TempSrc: Oral Oral  Oral  SpO2: 93% (!) 82% 97% 95%  Weight:      Height:        Intake/Output Summary (Last 24 hours) at 03/22/2018 1203 Last data filed at 03/22/2018 0400 Gross per 24 hour  Intake 1148.74 ml  Output -  Net 1148.74 ml   Filed Weights   03/18/18 0541 03/20/18 0553 03/21/18 0600  Weight: 120 lb (54.4 kg) 127 lb 6.8 oz (57.8 kg) 123 lb 0.3 oz (55.8 kg)    Telemetry    Intermittent V pacing occasional intrinsic beats with sinus tachycardia right bundle branch block appearance- Personally Reviewed  ECG    No new- Personally Reviewed  Physical Exam   GEN: No acute distress, elderly.   Neck: No JVD Cardiac:  Mildly tachycardic regular, no murmurs, rubs, or gallops.  Her pocket, small hematoma Respiratory: Clear to  auscultation bilaterally. GI: Soft, nontender, non-distended  MS: No edema; No deformity. Neuro:  Nonfocal  Psych: Normal affect   Labs    Chemistry Recent Labs  Lab 03/20/18 0414 03/21/18 0857 03/22/18 0337  NA 141 140 141  K 4.0 4.3 3.5  CL 109 109 112*  CO2 26 21* 24  GLUCOSE 99 188* 121*  BUN 18 22* 24*  CREATININE 1.12* 1.19* 1.07*  CALCIUM 7.9* 8.3* 7.9*  GFRNONAA 42* 39* 45*  GFRAA 49* 46* 52*  ANIONGAP 6 10 5      Hematology Recent Labs  Lab 03/20/18 0414 03/21/18 0857 03/22/18 0337  WBC 7.4 11.2* 8.8  RBC 3.32* 3.86* 2.82*  HGB 9.9* 11.4* 8.5*  HCT 31.9* 36.5 26.6*  MCV 96.1 94.6 94.3  MCH 29.8 29.5 30.1  MCHC 31.0 31.2 32.0  RDW 13.5 13.7 13.7  PLT 125* 103* 95*    Cardiac EnzymesNo results for input(s): TROPONINI in the last 168 hours. No results for input(s): TROPIPOC in the last 168 hours.   BNP Recent Labs  Lab 03/21/18 0857  BNP 693.0*     DDimer No results for input(s): DDIMER in the last 168 hours.   Radiology  Dg Chest 2 View  Result Date: 03/21/2018 CLINICAL DATA: Cardiac device in situ. EXAM: CHEST - 2 VIEW COMPARISON:  03/18/2018. FINDINGS: Interim removal of temporary pacer. Interim placement of cardiac pacer with lead tips over the right atrium right ventricle. Prior cardiac valve replacement. Heart size normal. Bibasilar atelectasis/infiltrates and small bilateral pleural effusions. No pneumothorax. IMPRESSION: 1. Interim removal of temporary pacer. Interim placement of cardiac pacer with lead tips in right atrium right ventricle. 2. Bibasilar atelectasis/infiltrates and small bilateral pleural effusions noted on today's exam. 3.  Prior cardiac valve replacement.  Heart size stable. Electronically Signed   By: Marcello Moores  Register   On: 03/21/2018 07:16    Cardiac Studies   Echocardiogram 03/19/2018 - Left ventricle: The cavity size was normal. Wall thickness was   increased in a pattern of severe LVH. There was focal basal    hypertrophy. Systolic function was vigorous. The estimated   ejection fraction was in the range of 65% to 70%. Wall motion was   normal; there were no regional wall motion abnormalities. Doppler   parameters are consistent with abnormal left ventricular   relaxation (grade 1 diastolic dysfunction). - Aortic valve: A bioprosthesis was present. - Mitral valve: Mildly calcified annulus. Moderately thickened,   moderately calcified leaflets . There was mild regurgitation. - Pulmonary arteries: Systolic pressure was moderately increased.   PA peak pressure: 53 mm Hg (S).  Patient Profile     84 y.o. female post TAVR with postoperative heart block, status post pacemaker implantation  Assessment & Plan    TAVR -Stable.  Intact post op echocardiogram  Heart block post TAVR -Pacemaker implantation.  Known potential complication  Shortness of breath -Likely a component of acute diastolic heart failure.  EF 65 to 70%, vigorous.  Agree with IV Lasix.  Continue to keep potassium greater than 4.  Creatinine stable.    For questions or updates, please contact [place] Please consult [email or web address] for contact info under Cardiology/STEMI.      Signed, Candee Furbish, MD  03/22/2018, 12:03 PM

## 2018-03-22 NOTE — Evaluation (Addendum)
Physical Therapy Evaluation Patient Details Name: Kaitlin Villarreal MRN: [id number] DOB: [birth year] Today's Date: 03/22/2018   History of Present Illness  Kaitlin Villarreal is a 84 y.o. female with a history of HTN, HLD, CKD and severe AS who presented to [facility] on 03/18/18 for planned TAVR, developed CHB with pacemaker placement 03/20/18. PMH includes: HFpEF, TIA, CHD III, HTN, HLD    Clinical Impression  Patient is s/p above surgery resulting in functional limitations due to the deficits listed below (see PT Problem List). PTA, pt living wit husband on first level of home with stairs to enter, independent with ADLs and community ambulation without AD. Upon eval pt presents with post op pain and weakness, mild balance and strength deficits. Ambulated 150' feet without AD or overt LOBs, stand by assist and hands on guarding at times to provide safety and stability. Husband present and heavily involved, discussed proper guarding to ensure safety and advised she not mobilize OOB independently for now to maximize safety. Pt and husband agree. Pt ambulated on RA, SpO2= 88% after walking, returned to low 90's with rest break towards end session pt fatigued. Discussed energy conservation and benefit of HHPT for short term while she eventually [facility] cardiac rehab phase II.  Patient will benefit from skilled PT to increase their independence and safety with mobility to allow discharge to the venue listed below.       Follow Up Recommendations Home health PT;Supervision for mobility/OOB    Equipment Recommendations  None recommended by PT    Recommendations for Other Services OT consult     Precautions / Restrictions Precautions Precautions: Fall Precaution Comments: discussed activity limitations s/p pacemaker with husband and patient Required Braces or Orthoses: Sling Restrictions Weight Bearing Restrictions: Yes LUE Weight Bearing: Non weight bearing Other Position/Activity Restrictions: s/p pacemaker       Mobility  Bed Mobility Overal bed mobility: Needs Assistance Bed Mobility: Supine to Sit     Supine to sit: Min guard     General bed mobility comments: able to supine to sit without physical assistance  Transfers Overall transfer level: Needs assistance Equipment used: None Transfers: Sit to/from Stand Sit to Stand: Min guard         General transfer comment: Patient able to stand without AD, guarding for safety. denies dizziness standing up. no overt LOB  Ambulation/Gait Ambulation/Gait assistance: Min guard Gait Distance (Feet): 120 Feet Assistive device: None Gait Pattern/deviations: Step-to pattern;Step-through pattern Gait velocity: decreased   General Gait Details: pt with mild unsteady gait but no overt LOB, no AD use. guard for safety. discussed with patients husband necessary level of supervision/guarding required to provide safety while pt ambulates as she reports she does not feel as strong as her baseline. SpO2 ranged 88-93% during ambulation.   Stairs            Wheelchair Mobility    Modified Rankin (Stroke Patients Only)       Balance Overall balance assessment: Needs assistance   Sitting balance-Leahy Scale: Fair       Standing balance-Leahy Scale: Fair                               Pertinent Vitals/Pain Pain Assessment: 0-10 Pain Score: 4  Pain Location: L shoulder, back Pain Descriptors / Indicators: Discomfort Pain Intervention(s): Limited activity within patient's tolerance;Monitored during session    Home Living Family/patient expects to be discharged to:: Private residence Living  Arrangements: Spouse/significant other Available Help at Discharge: Family;Available 24 hours/day Type of Home: House Home Access: Stairs to enter Entrance Stairs-Rails: Can reach both Entrance Stairs-Number of Steps: 4 Home Layout: One level;Able to live on main level with bedroom/bathroom Home Equipment: Gilford Rile - 2 wheels;Shower  seat - built in      Prior Function Level of Independence: Independent         Comments: not using AD, community ambulation, independent with ADLs     Hand Dominance        Extremity/Trunk Assessment   Upper Extremity Assessment Upper Extremity Assessment: Defer to OT evaluation;Overall [facility] for tasks assessed    Lower Extremity Assessment Lower Extremity Assessment: (BLE strength 4-/5 gross)       Communication   Communication: No difficulties  Cognition Arousal/Alertness: Awake/alert Behavior During Therapy: WFL for tasks assessed/performed Overall Cognitive Status: Within Functional Limits for tasks assessed                                        General Comments      Exercises General Exercises - Lower Extremity Long Arc Quad: 10 reps   Assessment/Plan    PT Assessment Patient needs continued PT services  PT Problem List Decreased strength;Decreased range of motion;Decreased activity tolerance;Decreased balance;Decreased mobility;Pain       PT Treatment Interventions DME instruction;Gait training;Functional mobility training;Stair training;Therapeutic activities;Therapeutic exercise;Balance training    PT Goals (Current goals can be found in the Care Plan section)  Acute Rehab PT Goals Patient Stated Goal: go home when ready PT Goal Formulation: With patient/family Time For Goal Achievement: 03/29/18 Potential to Achieve Goals: Good    Frequency Min 3X/week   Barriers to discharge        Co-evaluation               AM-PAC PT "6 Clicks" Daily Activity  Outcome Measure Difficulty turning over in bed (including adjusting bedclothes, sheets and blankets)?: A Little Difficulty moving from lying on back to sitting on the side of the bed? : A Little Difficulty sitting down on and standing up from a chair with arms (e.g., wheelchair, bedside commode, etc,.)?: A Little Help needed moving to and from a bed to chair (including a  wheelchair)?: A Little Help needed walking in hospital room?: A Little Help needed climbing 3-5 steps with a railing? : A Little 6 Click Score: 18    End of Session Equipment Utilized During Treatment: Gait belt Activity Tolerance: Patient tolerated treatment well Patient left: in bed;with call bell/phone within reach;with family/visitor present Nurse Communication: Mobility status PT Visit Diagnosis: Unsteadiness on feet (R26.81);Muscle weakness (generalized) (M62.81);Pain Pain - Right/Left: Left Pain - part of body: Shoulder    Time: 1505-6979 PT Time Calculation (min) (ACUTE ONLY): 40 min   Charges:   PT Evaluation $PT Eval Low Complexity: 1 Low PT Treatments $Gait Training: 8-22 mins $Therapeutic Activity: 8-22 mins   PT G Codes:        Reinaldo Berber, PT, DPT Acute Rehab Services Pager: [phone]    Reinaldo Berber 03/22/2018, 10:25 AM

## 2018-03-22 NOTE — Progress Notes (Signed)
Right wrist IV site infiltrated and 20mg  Lasix dose lost. Will repeat once new IV access is established.

## 2018-03-22 NOTE — Progress Notes (Signed)
CARDIAC REHAB PHASE I   PRE:  Rate/Rhythm: 84 SR  BP:  Supine: 146/60  Sitting:   Standing:    SaO2: 98 2L  MODE:  Ambulation: 450 ft   POST:  Rate/Rhythm: 99 SR  BP:  Supine:   Sitting: 139/54  Standing:    SaO2: 95 2L 1340-1410 Assisted X 2 used rollator, gait belt and O2 2L to ambulate. Gait steady with rollator. She was able to walk 450 feet. VS stable. O2 sat in hall 93% and after walk 95% on 2L of O2. Pt back to bed after walk with call light in reach and visitors present. She states that this walk was easily than this mornings walk, she was less SOB.   Rodney Langton RN 03/22/2018 2:09 PM

## 2018-03-23 LAB — CBC
HEMATOCRIT: 30.5 % — AB (ref 36.0–46.0)
HEMOGLOBIN: 9.6 g/dL — AB (ref 12.0–15.0)
MCH: 29.8 pg (ref 26.0–34.0)
MCHC: 31.5 g/dL (ref 30.0–36.0)
MCV: 94.7 fL (ref 78.0–100.0)
Platelets: 136 10*3/uL — ABNORMAL LOW (ref 150–400)
RBC: 3.22 MIL/uL — AB (ref 3.87–5.11)
RDW: 13.9 % (ref 11.5–15.5)
WBC: 8.3 10*3/uL (ref 4.0–10.5)

## 2018-03-23 LAB — BASIC METABOLIC PANEL
Anion gap: 7 (ref 5–15)
BUN: 23 mg/dL — AB (ref 6–20)
CHLORIDE: 107 mmol/L (ref 101–111)
CO2: 28 mmol/L (ref 22–32)
CREATININE: 1.22 mg/dL — AB (ref 0.44–1.00)
Calcium: 8.7 mg/dL — ABNORMAL LOW (ref 8.9–10.3)
GFR calc non Af Amer: 38 mL/min — ABNORMAL LOW (ref >60)
GFR, EST AFRICAN AMERICAN: 44 mL/min — AB (ref >60)
Glucose, Bld: 160 mg/dL — ABNORMAL HIGH (ref 65–99)
Potassium: 4.1 mmol/L (ref 3.5–5.1)
Sodium: 142 mmol/L (ref 135–145)

## 2018-03-23 LAB — BRAIN NATRIURETIC PEPTIDE: B Natriuretic Peptide: 433.4 pg/mL — ABNORMAL HIGH (ref 0.0–100.0)

## 2018-03-23 MED ORDER — FUROSEMIDE 10 MG/ML IJ SOLN
20.0000 mg | Freq: Four times a day (QID) | INTRAMUSCULAR | Status: AC
  Administered 2018-03-23 (×3): 20 mg via INTRAVENOUS
  Filled 2018-03-23 (×3): qty 2

## 2018-03-23 MED ORDER — SALINE SPRAY 0.65 % NA SOLN
1.0000 | NASAL | Status: DC | PRN
  Filled 2018-03-23: qty 44

## 2018-03-23 NOTE — Progress Notes (Addendum)
      [address]       [place] [address]             [phone]      3 Days Post-Op Procedure(s) (LRB): PACEMAKER IMPLANT (N/A) RIGHT HEART CATH (N/A)   Subjective:  Wants to stop wearing oxygen as it is bothering her nose.  She otherwise states she is doing okay.  Continues to be tired. Patient provided education of keeping arm stable in sling due to PPM placement.  Objective: Vital signs in last 24 hours: Temp:  [97.5 F (36.4 C)-97.7 F (36.5 C)] 97.5 F (36.4 C) (06/23 0815) Pulse Rate:  [83-94] 84 (06/23 0815) Cardiac Rhythm: Normal sinus rhythm (06/23 0815) Resp:  [16-22] 19 (06/23 0815) BP: (132-154)/(45-60) 134/48 (06/23 0815) SpO2:  [91 %-99 %] 93 % (06/23 0815) Weight:  [121 lb 9.6 oz (55.2 kg)] 121 lb 9.6 oz (55.2 kg) (06/23 0600)  Intake/Output from previous day: 06/22 0701 - 06/23 0700 In: 580 [P.O.:580] Out: 1300 [Urine:1300] Intake/Output this shift: Total I/O In: 240 [P.O.:240] Out: -   General appearance: alert, cooperative and no distress Heart: regular rate and rhythm Lungs: clear to auscultation bilaterally Abdomen: soft, non-tender; bowel sounds normal; no masses,  no organomegaly Extremities: extremities normal, atraumatic, no cyanosis or edema Wound: R Groin no hematoma present, mild ecchymosis.... PPM site has worsening of previous ecchymosis/hematoma  Lab Results: Recent Labs    03/21/18 0857 03/22/18 0337  WBC 11.2* 8.8  HGB 11.4* 8.5*  HCT 36.5 26.6*  PLT 103* 95*   BMET:  Recent Labs    03/21/18 0857 03/22/18 0337  NA 140 141  K 4.3 3.5  CL 109 112*  CO2 21* 24  GLUCOSE 188* 121*  BUN 22* 24*  CREATININE 1.19* 1.07*  CALCIUM 8.3* 7.9*    PT/INR: No results for input(s): LABPROT, INR in the last 72 hours. ABG    Component Value Date/Time   PHART 7.420 03/14/2018 1059   HCO3 18.9 (L) 03/20/2018 1649   TCO2 20 (L) 03/20/2018 1649   ACIDBASEDEF 6.0 (H) 03/20/2018 1649   O2SAT 49.0 03/20/2018 1649    CBG (last 3)  No results for input(s): GLUCAP in the last 72 hours.  Assessment/Plan: S/P Procedure(s) (LRB): PACEMAKER IMPLANT (N/A) RIGHT HEART CATH (N/A)  1. CV- Severe AS, S/P TAVR, CHB S/P PPM- hemodynamically stable on Labetolol, Norvasc 2. Post operative anemia- likely related to hematoma of PPM site, this appears to have extended today, awaiting repeat CBC, BMET 3. Deconditioning- PT recs home health PT at discharge 4. DIspo- patient stable, hematoma at [facility] appears to be larger with extension into L breast and arm, awaiting repeat CBC, BMET results, may need to scan to assess hematoma  LOS: 5 days    Ellwood Handler 03/23/2018  I have seen and examined the patient and agree with the assessment and plan as outlined.  Hgb increased slightly 9.6 w/ diuresis.  Hopefully ready for d/c home by tomorrow unless EPS plans to evacuate hematoma from PPM pocket.  Rexene Alberts, MD 03/23/2018 2:27 PM

## 2018-03-23 NOTE — Evaluation (Signed)
Occupational Therapy Evaluation and Discharge Patient Details Name: Kaitlin Villarreal MRN: [id number] DOB: 07-Mar-1926 Today's Date: 03/23/2018    History of Present Illness Kaitlin Villarreal is a 84 y.o. female with a history of HTN, HLD, CKD and severe AS who presented to [facility] on 03/18/18 for planned TAVR, developed CHB with pacemaker placement 03/20/18. PMH includes: HFpEF, TIA, CHD III, HTN, HLD   Clinical Impression   This 84 yo female admitted and underwent above presents to acute OT with all education completed with pt and husband. We will D/C from acute OT.    Follow Up Recommendations  No OT follow up;Supervision/Assistance - 24 hour    Equipment Recommendations  None recommended by OT       Precautions / Restrictions Precautions Precautions: Fall Precaution Comments: discussed activity limitations s/p pacemaker with husband and patient Required Braces or Orthoses: Sling Restrictions Weight Bearing Restrictions: Yes LUE Weight Bearing: Non weight bearing Other Position/Activity Restrictions: MD wants pt in sling due to hematoma       Mobility Bed Mobility Overal bed mobility: Modified Independent             General bed mobility comments: increased time  Transfers Overall transfer level: Needs assistance Equipment used: None Transfers: Sit to/from Stand Sit to Stand: Supervision                  ADL either performed or assessed with clinical judgement   ADL Overall ADL's : Needs assistance/impaired Eating/Feeding: Set up;Sitting   Grooming: Minimal assistance;Sitting   Upper Body Bathing: Moderate assistance;Sitting   Lower Body Bathing: Minimal assistance Lower Body Bathing Details (indicate cue type and reason): with S sit<>stand Upper Body Dressing : Maximal assistance;Sitting   Lower Body Dressing: Moderate assistance Lower Body Dressing Details (indicate cue type and reason): S sit<>stand Toilet Transfer: Min guard;Ambulation   Toileting- Clothing  Manipulation and Hygiene: Minimal assistance Toileting - Clothing Manipulation Details (indicate cue type and reason): S sit<>stand       General ADL Comments: Husband to A prn with UB/LB ADLs. He was able to don her sling independently. Pt and husband aware she is not to get pacemaker site wet until cleared by surgeon--until then sponge bath and washing hair at kitchen sink.     Vision Patient Visual Report: No change from baseline              Pertinent Vitals/Pain Pain Assessment: No/denies pain     Hand Dominance Right   Extremity/Trunk Assessment Upper Extremity Assessment Upper Extremity Assessment: LUE deficits/detail LUE Deficits / Details: Limited shoulder movement due to new pacemaker this admission. Elbow distally WNL/WFL LUE Coordination: decreased gross motor           Communication Communication Communication: No difficulties   Cognition Arousal/Alertness: Awake/alert Behavior During Therapy: WFL for tasks assessed/performed Overall Cognitive Status: Within Functional Limits for tasks assessed                                        Exercises Other Exercises Other Exercises: MD wants pt to keep sling on. Educated pt and husband on taking sling off for bathing, dressing, and elbow to hand exercises. Post op shoulder handout given to patient due to its pertience to this situation.         Home Living Family/patient expects to be discharged to:: Private residence Living Arrangements: Spouse/significant other Available Help  at Discharge: Family;Available 24 hours/day Type of Home: House Home Access: Stairs to enter [employer] of Steps: 4 Entrance Stairs-Rails: Can reach both Home Layout: One level;Able to live on main level with bedroom/bathroom     Bathroom Shower/Tub: Teacher, early years/pre: Standard Bathroom Accessibility: Yes   Home Equipment: Walker - 2 wheels;Shower seat - built in;Hand held shower  head;Grab bars - tub/shower          Prior Functioning/Environment Level of Independence: Independent        Comments: not using AD, community ambulation, independent with ADLs        OT Problem List: Decreased range of motion;Impaired UE functional use;Impaired balance (sitting and/or standing)         OT Goals(Current goals can be found in the care plan section) Acute Rehab OT Goals Patient Stated Goal: go home when ready  OT Frequency:                AM-PAC PT "6 Clicks" Daily Activity     Outcome Measure Help from another person eating meals?: A Little Help from another person taking care of personal grooming?: A Little Help from another person toileting, which includes using toliet, bedpan, or urinal?: A Lot Help from another person bathing (including washing, rinsing, drying)?: A Lot Help from another person to put on and taking off regular upper body clothing?: A Lot Help from another person to put on and taking off regular lower body clothing?: A Lot 6 Click Score: 14   End of Session Equipment Utilized During Treatment: (sling)  Activity Tolerance: Patient tolerated treatment well Patient left: in bed;with call bell/phone within reach;with family/visitor present  OT Visit Diagnosis: Unsteadiness on feet (R26.81)                Time: 3818-2993 OT Time Calculation (min): 35 min Charges:  OT General Charges $OT Visit: 1 Visit OT Evaluation $OT Eval Moderate Complexity: 1 Mod OT Treatments $Self Care/Home Management : 8-22 mins [place] [address], OTR/L [phone] 03/23/2018

## 2018-03-23 NOTE — Progress Notes (Signed)
Progress Note  Patient Name: Kaitlin Villarreal Date of Encounter: 03/23/2018  Primary Cardiologist: Shirlee More, MD   Subjective   Still feels quite tired, no chest pain.  Shortness of breath may be improved. Inpatient Medications    Scheduled Meds: . acetaminophen  1,000 mg Oral Q6H   Or  . acetaminophen (TYLENOL) oral liquid 160 mg/5 mL  1,000 mg Per Tube Q6H  . amLODipine  2.5 mg Oral Daily  . aspirin EC  81 mg Oral Daily  . Chlorhexidine Gluconate Cloth  6 each Topical Daily  . estradiol  0.5 mg Oral Daily  . famotidine  20 mg Oral Daily  . furosemide  20 mg Intravenous Q6H  . labetalol  100 mg Oral BID  . omega-3 acid ethyl esters  1,000 mg Oral Daily  . potassium chloride  40 mEq Oral TID WC  . pravastatin  20 mg Oral QHS  . sodium chloride flush  10-40 mL Intracatheter Q12H   Continuous Infusions:  PRN Meds: acetaminophen, ondansetron (ZOFRAN) IV, polyvinyl alcohol, sodium chloride, sodium chloride flush, traMADol, white petrolatum   Vital Signs    Vitals:   03/23/18 0600 03/23/18 0815 03/23/18 0942 03/23/18 0944  BP:  (!) 134/48 (!) 133/53   Pulse:  84  83  Resp:  19    Temp:  (!) 97.5 F (36.4 C)    TempSrc:  Oral    SpO2:  93%    Weight: 121 lb 9.6 oz (55.2 kg)     Height:        Intake/Output Summary (Last 24 hours) at 03/23/2018 1028 Last data filed at 03/23/2018 0800 Gross per 24 hour  Intake 480 ml  Output 1300 ml  Net -820 ml   Filed Weights   03/20/18 0553 03/21/18 0600 03/23/18 0600  Weight: 127 lb 6.8 oz (57.8 kg) 123 lb 0.3 oz (55.8 kg) 121 lb 9.6 oz (55.2 kg)    Telemetry    Intrinsic beats noted.- Personally Reviewed  ECG    No new- Personally Reviewed  Physical Exam   GEN: Elderly, in no acute distress  HEENT: normal  Neck: no JVD, carotid bruits, or masses Cardiac: RRR; no murmurs, rubs, or gallops,no edema  Respiratory:  clear to auscultation bilaterally, normal work of breathing GI: soft, nontender, nondistended, +  BS MS: no deformity or atrophy  Skin: warm and dry, no rash Neuro:  Alert and Oriented x 3, Strength and sensation are intact Psych: euthymic mood, full affect   Labs    Chemistry Recent Labs  Lab 03/21/18 0857 03/22/18 0337 03/23/18 0849  NA 140 141 142  K 4.3 3.5 4.1  CL 109 112* 107  CO2 21* 24 28  GLUCOSE 188* 121* 160*  BUN 22* 24* 23*  CREATININE 1.19* 1.07* 1.22*  CALCIUM 8.3* 7.9* 8.7*  GFRNONAA 39* 45* 38*  GFRAA 46* 52* 44*  ANIONGAP 10 5 7      Hematology Recent Labs  Lab 03/21/18 0857 03/22/18 0337 03/23/18 0849  WBC 11.2* 8.8 8.3  RBC 3.86* 2.82* 3.22*  HGB 11.4* 8.5* 9.6*  HCT 36.5 26.6* 30.5*  MCV 94.6 94.3 94.7  MCH 29.5 30.1 29.8  MCHC 31.2 32.0 31.5  RDW 13.7 13.7 13.9  PLT 103* 95* 136*    Cardiac EnzymesNo results for input(s): TROPONINI in the last 168 hours. No results for input(s): TROPIPOC in the last 168 hours.   BNP Recent Labs  Lab 03/21/18 0857  BNP 693.0*  DDimer No results for input(s): DDIMER in the last 168 hours.   Radiology    No results found.  Cardiac Studies   Echocardiogram 03/19/2018 - Left ventricle: The cavity size was normal. Wall thickness was   increased in a pattern of severe LVH. There was focal basal   hypertrophy. Systolic function was vigorous. The estimated   ejection fraction was in the range of 65% to 70%. Wall motion was   normal; there were no regional wall motion abnormalities. Doppler   parameters are consistent with abnormal left ventricular   relaxation (grade 1 diastolic dysfunction). - Aortic valve: A bioprosthesis was present. - Mitral valve: Mildly calcified annulus. Moderately thickened,   moderately calcified leaflets . There was mild regurgitation. - Pulmonary arteries: Systolic pressure was moderately increased.   PA peak pressure: 53 mm Hg (S).  Patient Profile     84 y.o. female post TAVR with postoperative heart block, status post pacemaker implantation  Assessment &  Plan    TAVR -Stable.  Intact post op echocardiogram, no new issues.  Heart block post TAVR/pacemaker -Pacemaker implantation.  Known potential complication. -Pacemaker hematoma pocket site noted.  Hemoglobin is remaining fairly stable, slightly increased today from 8.5 up to 9.6.  At this time, continue with conservative management for pacemaker pocket.  Platelets also improved from 95-136.  May wish to let EP to look at pacer site tomorrow.  Intrinsic rhythm currently noted.  Acute diastolic heart failure -EF 70% vigorous.  On IV Lasix 20 mg every 6 hours.  Weight is down to 121 pounds (127 pounds previously).  Out 1.3 L yesterday. -Likely a component of acute diastolic heart failure.  EF 65 to 70%, vigorous.  Agree with IV Lasix again today.  Continue to keep potassium greater than 4.  Creatinine stable.  Hopeful dc tomorrow.    For questions or updates, please contact [place] Please consult [email or web address] for contact info under Cardiology/STEMI.      Signed, Candee Furbish, MD  03/23/2018, 10:28 AM

## 2018-03-23 NOTE — Progress Notes (Signed)
Physical Therapy Treatment Patient Details Name: Kaitlin Villarreal MRN: [id number] DOB: 09/13/1926 Today's Date: 03/23/2018    History of Present Illness Kaitlin Villarreal is a 84 y.o. female with a history of HTN, HLD, CKD and severe AS who presented to [facility] on 03/18/18 for planned TAVR, developed CHB with pacemaker placement 03/20/18. PMH includes: HFpEF, TIA, CHD III, HTN, HLD    PT Comments    Pt admitted with above diagnosis. Pt currently with functional limitations due to the deficits listed below (see PT Problem List). Pt was able to ambulate with min guard assist in hallway and perform up and down steps with min guard assist. Issued gait belt to pt. And husband educated in how to use it.  Progressing.  Pt will benefit from skilled PT to increase their independence and safety with mobility to allow discharge to the venue listed below.     Follow Up Recommendations  Home health PT;Supervision for mobility/OOB     Equipment Recommendations  Other (comment)(may need a straight cane)    Recommendations for Other Services OT consult     Precautions / Restrictions Precautions Precautions: Fall Precaution Comments: discussed activity limitations s/p pacemaker with husband and patient Required Braces or Orthoses: Sling Restrictions Weight Bearing Restrictions: Yes LUE Weight Bearing: Non weight bearing Other Position/Activity Restrictions: MD wants pt in sling due to hematoma - getting testing done today    Mobility  Bed Mobility Overal bed mobility: Needs Assistance Bed Mobility: Supine to Sit     Supine to sit: Min guard     General bed mobility comments: able to supine to sit without physical assistance  Transfers Overall transfer level: Needs assistance Equipment used: None Transfers: Sit to/from Stand Sit to Stand: Min guard         General transfer comment: Patient able to stand without AD, guarding for safety. denies dizziness standing up. no overt  LOB  Ambulation/Gait Ambulation/Gait assistance: Min guard Gait Distance (Feet): 350 Feet Assistive device: None Gait Pattern/deviations: Step-through pattern;Decreased stride length;Trunk flexed;Narrow base of support Gait velocity: decreased Gait velocity interpretation: <1.31 ft/sec, indicative of household ambulator General Gait Details: pt with mild unsteady gait but no overt LOB, no AD use. guard for safety. discussed with patients husband necessary level of supervision/guarding required to provide safety while pt ambulates as she reports she does not feel as strong as her baseline. Issued a gait belt for husband to guard her at home.  SpO2 ranged 88-93% during ambulation on RA.    Stairs Stairs: Yes Stairs assistance: Min guard Stair Management: One rail Right;One rail Left;Alternating pattern;Step to pattern;Forwards Number of Stairs: 6 General stair comments: Pt ascending steps using right rail and alternating pattern and descended using left rail with step to pattern.  no LOB and just min guard assist.    Wheelchair Mobility    Modified Rankin (Stroke Patients Only)       Balance Overall balance assessment: Needs assistance Sitting-balance support: No upper extremity supported;Feet supported Sitting balance-Leahy Scale: Fair     Standing balance support: Single extremity supported;During functional activity Standing balance-Leahy Scale: Fair Standing balance comment: Can stand statically without UE support but pt feels more confident with at least single UE support                            Cognition Arousal/Alertness: Awake/alert Behavior During Therapy: WFL for tasks assessed/performed Overall Cognitive Status: Within Functional Limits for tasks assessed  Exercises General Exercises - Lower Extremity Long Arc Quad: 10 reps;AROM;Both;Seated    General Comments        Pertinent  Vitals/Pain Pain Assessment: Faces Faces Pain Scale: Hurts a little bit Pain Location: L shoulder, back Pain Descriptors / Indicators: Discomfort Pain Intervention(s): Limited activity within patient's tolerance;Monitored during session;Repositioned    Home Living                      Prior Function            PT Goals (current goals can now be found in the care plan section) Acute Rehab PT Goals Patient Stated Goal: go home when ready Progress towards PT goals: Progressing toward goals    Frequency    Min 3X/week      PT Plan Current plan remains appropriate    Co-evaluation              AM-PAC PT "6 Clicks" Daily Activity  Outcome Measure  Difficulty turning over in bed (including adjusting bedclothes, sheets and blankets)?: None Difficulty moving from lying on back to sitting on the side of the bed? : A Little Difficulty sitting down on and standing up from a chair with arms (e.g., wheelchair, bedside commode, etc,.)?: A Little Help needed moving to and from a bed to chair (including a wheelchair)?: A Little Help needed walking in hospital room?: A Little Help needed climbing 3-5 steps with a railing? : A Little 6 Click Score: 19    End of Session Equipment Utilized During Treatment: Gait belt Activity Tolerance: Patient tolerated treatment well Patient left: with call bell/phone within reach;with family/visitor present;in chair Nurse Communication: Mobility status PT Visit Diagnosis: Unsteadiness on feet (R26.81);Muscle weakness (generalized) (M62.81);Pain Pain - Right/Left: Left Pain - part of body: Shoulder     Time: 9622-2979 PT Time Calculation (min) (ACUTE ONLY): 23 min  Charges:  $Gait Training: 8-22 mins $Therapeutic Exercise: 8-22 mins                    G Codes:       Royer Cristobal,PT Acute Rehabilitation [phone] (pager)    Denice Paradise 03/23/2018, 10:42 AM

## 2018-03-24 MED ORDER — AMLODIPINE BESYLATE 5 MG PO TABS: 5.0000 mg | ORAL_TABLET | Freq: Every day | ORAL | 6 refills | Status: DC

## 2018-03-24 NOTE — Progress Notes (Signed)
Came by to evaluate pacemaker site. Small hematoma. I do not think she needs a pressure dressing. No need for pocket evaluation. Follow up as scheduled with device clinic.  Chanetta Marshall, NP 03/24/2018 7:35 AM

## 2018-03-24 NOTE — Progress Notes (Signed)
DC instructions given to patient at this time.  Pt verbalized understanding of all instructions.  No s/s of any acute distress noted.

## 2018-03-24 NOTE — Progress Notes (Signed)
CARDIAC REHAB PHASE I   PRE:  Rate/Rhythm: 88 SR    BP: sitting 155/61    SaO2: 93 RA  MODE:  Ambulation: 240 ft   POST:  Rate/Rhythm: 107 ST    BP: sitting 153/56     SaO2: 89 RA, up to 93 RA with rest  Pt able to move to EOB with min assist. She was able to remember left arm precautions. She did have slight LOB upon standing that she steadied herself with bed railing. Pt walked with gait belt and handheld assist on her right side. Demonstrated to husband although he did not want to practice. She has also been using RW in room going to bathroom. Discussed making sure she does not press down too hard with left arm. She is good about remembering precautions. No major c/o walking. SaO2 89 RA after going to bathroom. Discussed walking at home, IS, restrictions and CRPII. Will send referral to [place], ACSM 03/24/2018 9:11 AM

## 2018-03-25 ENCOUNTER — Telehealth: Payer: Self-pay | Admitting: Physician Assistant

## 2018-03-25 NOTE — Progress Notes (Signed)
[facility]                                       Cardiology Office Note    Date:  03/27/2018   ID:  [name], DOB [birth year], MRN [id number]  PCP:  Angelina Sheriff, MD  Cardiologist: Dr. Bettina Gavia / Dr. Burt Knack & Dr. Roxy Manns (TAVR)  CC: TOC s/p TAVR  History of Present Illness:  Kaitlin Villarreal is a 84 y.o. female with a history of HTN, HLD, CKD and severe AS s/p TAVR (03/18/18) who presents to clinic for follow up.   The patient states that she was first noted to have a heart murmur on routine physical examination by her gynecologist nearly 20 years ago. She was eventually referred for cardiology evaluation and has been followed for several years by Dr. Bettina Gavia. She has remained reasonably active physically despite her advanced age, although she has slowed down considerably over the last few years. She has developed exertional shortness of breath and fatigue,some of which the patient has blamed on decreased activity because of problems with her back. She underwent a routine transthoracic echocardiogram on December 23, 2017 at [facility]. By report this echocardiogram revealed severe aortic stenosis with normal left ventricular systolic function. Ejection fraction was estimated greater than 70%. Peak velocity across aortic valve was reported 4.1 m/s with peak and mean transvalvular gradients estimated 69 and 42 mmHg respectively. Aortic valve area was calculated 0.87cm. Exercise stress echocardiogram was performed January 08, 2018. This confirmed the presence of severe aortic stenosis associated with hypertensive response to stress. Peak velocity across aortic valve at rest was measured 4.3 m/s corresponding to mean transvalvular gradient estimated 40 mmHg. The DVI was 0.24with aortic valve area calculated 0.76 cm. The patient was referred to Dr. Burt Knack and underwent diagnostic cardiac catheterization Feb 06, 2018. She was found to  have moderate nonobstructive coronary artery disease with normal right heart hemodynamics and preserved cardiac output.   She was evaluated by the multidisciplinary valve team and felt to be a suitable candidate for TAVR.  She underwent successful TAVR with a23mm Edwards Sapien 3 THV via the TF approach on 03/18/18.Surgery was c/b a small VSD that remained stable by serial echocardiograms. She also developed CHB requiring pacemaker placement. POD1 echo showed EF 65% and a normally functioning TAVR valve with no PVL and mean gradient not reported.  [place] 03/20/18 did not show any significant L--> R shunting of the VSD. She was discharged on ASA alone with plans for a follow up echo on 03/27/18.   Today she presents to clinic for follow up. She has been doing well. She feels like she is getting stronger and can do more without giving out. No CP or SOB. No LE edema, orthopnea or PND. No dizziness or syncope. No blood in stool or urine. No palpitations. She has some mild tenderness at her right groin site.     Past Medical History:  Diagnosis Date  . Acute on chronic diastolic heart failure ([place]) 03/18/2018  . Arthritis   . Bone spur of toe of left foot   . Chronic diastolic (congestive) heart failure ([place])   . Essential hypertension   . GERD (gastroesophageal reflux disease)   . HLD (hyperlipidemia)   . Hyperlipidemia 11/25/2016  . Pancreatic mass    a. benign appearing but needs  f/u, noted on pre TAVR CTs  . 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 Severe aortic stenosis    a. 03/2018: s/p TAVR by Burt Knack and Dr. Roxy Manns  . Stage 3 chronic kidney disease ([place])   . TIA (transient ischemic attack)     a. 1992  . VSD (ventricular septal defect)     Past Surgical History:  Procedure Laterality Date  . ABDOMINAL HYSTERECTOMY    .  APPENDECTOMY    . HERNIA REPAIR    . INTRAOPERATIVE TRANSTHORACIC ECHOCARDIOGRAM N/A 03/18/2018   Procedure: INTRAOPERATIVE TRANSTHORACIC ECHOCARDIOGRAM;  Surgeon: Sherren Mocha, MD;  Location: [facility];  Service: Open Heart Surgery;  Laterality: N/A;  . PACEMAKER IMPLANT N/A 03/20/2018   Procedure: PACEMAKER IMPLANT;  Surgeon: Constance Haw, MD;  Location: [facility];  Service: Cardiovascular;  Laterality: N/A;  . RIGHT HEART CATH N/A 03/20/2018   Procedure: RIGHT HEART CATH;  Surgeon: Sherren Mocha, MD;  Location: [facility];  Service: Cardiovascular;  Laterality: N/A;  . RIGHT/LEFT HEART CATH AND CORONARY ANGIOGRAPHY N/A 02/06/2018   Procedure: RIGHT/LEFT HEART CATH AND CORONARY ANGIOGRAPHY;  Surgeon: Sherren Mocha, MD;  Location: [place] [facility];  Service: Cardiovascular;  Laterality: N/A;  . TONSILLECTOMY    . TRANSCATHETER AORTIC VALVE REPLACEMENT, TRANSFEMORAL N/A 03/18/2018   Procedure: TRANSCATHETER AORTIC VALVE REPLACEMENT, TRANSFEMORAL;  Surgeon: Sherren Mocha, MD;  Location: [facility];  Service: Open Heart Surgery;  Laterality: N/A;  . WISDOM TOOTH EXTRACTION      Current Medications: Outpatient Medications Prior to Visit  Medication Sig Dispense Refill  . acetaminophen (TYLENOL) 650 MG CR tablet Take 650-1,300 mg by mouth every 8 (eight) hours as needed for pain.    Marland Kitchen amLODipine (NORVASC) 5 MG tablet Take 1 tablet (5 mg total) by mouth daily. 30 tablet 6  . aspirin EC 81 MG tablet Take 81 mg by mouth daily.     Marland Kitchen estradiol (ESTRACE) 1 MG tablet Take 0.5 mg by mouth daily.    Marland Kitchen labetalol (NORMODYNE) 100 MG tablet Take 100 mg by mouth 2 (two) times daily.     . Liniments (BLUE-EMU SUPER STRENGTH EX) Apply 1 application topically 3 (three) times daily as needed (for neck pain.).    Marland Kitchen Omega-3 Fatty Acids (FISH OIL) 1000 MG CAPS Take 1,000 mg by mouth daily.    Vladimir Faster Glycol-Propyl Glycol (SYSTANE OP) Place 1 drop into both eyes daily as needed (for dry  eyes).    . pravastatin (PRAVACHOL) 20 MG tablet Take 20 mg by mouth at bedtime.     . Probiotic Product (CVS PROBIOTIC) CHEW Chew 2 each by mouth daily after supper.     . ranitidine (ZANTAC) 300 MG tablet Take 300 mg by mouth at bedtime.     . Wheat Dextrin (BENEFIBER DRINK MIX PO) Take 1 packet by mouth daily. 1 tablespoonful daily in the morning     No facility-administered medications prior to visit.      Allergies:   Levofloxacin; Azithromycin; Clarithromycin; and Latex   Social History   Socioeconomic History  . Marital status: Married    Spouse name: Not on file  . Number of children: Not on file  . Years of education: Not on file  . Highest education level: Not on file  Occupational History  .  Not on file  Social Needs  . Financial resource strain: Not on file  . Food insecurity:    Worry: Not on file    Inability: Not on file  . Transportation needs:    Medical: Not on file    Non-medical: Not on file  Tobacco Use  . Smoking status: Never Smoker  . Smokeless tobacco: Never Used  Substance and Sexual Activity  . Alcohol use: No  . Drug use: No  . Sexual activity: Not on file  Lifestyle  . Physical activity:    Days per week: Not on file    Minutes per session: Not on file  . Stress: Not on file  Relationships  . Social connections:    Talks on phone: Not on file    Gets together: Not on file    Attends religious service: Not on file    Active member of club or organization: Not on file    Attends meetings of clubs or organizations: Not on file    Relationship status: Not on file  Other Topics Concern  . Not on file  Social History Narrative  . Not on file     Family History:  The patient's family history includes Congenital heart disease in her brother; Heart attack in her brother; Heart disease in her mother; Uterine cancer in her sister.      ROS:   Please see the history of present illness.    ROS All other systems reviewed and are  negative.   PHYSICAL EXAM:   VS:  BP 130/60   Pulse 79   Ht 5' (1.524 m)   Wt 119 lb 3.2 oz (54.1 kg)   SpO2 97%   BMI 23.28 kg/m    GEN: Well nourished, well developed, in no acute distress  HEENT: normal  Neck: no JVD, carotid bruits, or masses Cardiac: RRR; soft SEM @ RUSB. No rubs, or gallops,no edema. Stable hematoma at pacemaker site. Respiratory:  clear to auscultation bilaterally, normal work of breathing GI: soft, nontender, nondistended, + BS MS: no deformity or atrophy  Skin: warm and dry, no rash. Right groin with hard, tender hematoma + bruit Neuro:  Alert and Oriented x 3, Strength and sensation are intact Psych: euthymic mood, full affect   Wt Readings from Last 3 Encounters:  03/27/18 119 lb 3.2 oz (54.1 kg)  03/24/18 118 lb (53.5 kg)  03/14/18 120 lb 9.5 oz (54.7 kg)      Studies/Labs Reviewed:   EKG:  EKG is NOT ordered today.    Recent Labs: 03/14/2018: ALT 15 03/19/2018: Magnesium 2.0 03/23/2018: B Natriuretic Peptide 433.4; BUN 23; Creatinine, Ser 1.22; Hemoglobin 9.6; Platelets 136; Potassium 4.1; Sodium 142   Lipid Panel No results found for: CHOL, TRIG, HDL, CHOLHDL, VLDL, LDLCALC, LDLDIRECT  Additional studies/ records that were reviewed today include:  TAVR OPERATIVE NOTE   Date of Procedure:03/18/2018  Preoperative Diagnosis:Severe Aortic Stenosis   Procedure:   Transcatheter Aortic Valve Replacement - PercutaneousRightTransfemoral Approach Edwards Sapien 3 THV (size 19mm, model # 9600TFX, serial # [id number])  Co-Surgeons:Clarence H. Roxy Manns, MD and Sherren Mocha, MD  Pre-operative Echo Findings: ? Severe aortic stenosis ? Normalleft ventricular systolic function  Post-operative Echo Findings: ? Noparavalvular leak ? Very small perimembranous ventricular septal defect ? No pericardial effusion ? Normalleft ventricular systolic  function  _______________  Limited echo 03/18/18 5 hours later a limited TTE was performed. It showed unchanged small VSD, stable aortic valve with normal unchanged gradients. No paravalvular  leak. Trivial pericardial effusion  _______________  Post operative echo 03/19/18 Study Conclusions - Left ventricle: The cavity size was normal. Wall thickness was increased in a pattern of severe LVH. There was focal basal hypertrophy. Systolic function was vigorous. The estimated ejection fraction was in the range of 65% to 70%. Wall motion was normal; there were no regional wall motion abnormalities. Doppler parameters are consistent with abnormal left ventricular relaxation (grade 1 diastolic dysfunction). - Aortic valve: A bioprosthesis was present. - Mitral valve: Mildly calcified annulus. Moderately thickened, moderately calcified leaflets . There was mild regurgitation. - Pulmonary arteries: Systolic pressure was moderately increased. PA peak pressure: 53 mm Hg (S).  _______________  03/21/18 RIGHT HEART CATH  Conclusion   1. No significant left to right shunting based on absence of a significant O2 step up from the SVC to the pulmonary artery 2. Elevated right and left heart intracardiac filling pressures consistent with acute on chronic diastolic heart failure  Plan: Serial echo follow-up of the patient's small perimembranous VSD, IV diuresis and medical therapy for treatment of congestive heart failure, permanent pacemaker placement per Dr. Curt Bears to follow    _______________   03/20/18 SURGEON: Allegra Lai, MD   PREPROCEDURE DIAGNOSIS:Complete AV block  POSTPROCEDURE DIAGNOSIS:Complete AV block  PROCEDURES:  1. Pacemaker implantation. 2. Left upper extremity venography.    _______________  Limited echo 03/27/18 Study Conclusions - Left ventricle: Small perimembranous VSD with L to R flow. The cavity size was  normal. Wall thickness was normal. Systolic function was vigorous. The estimated ejection fraction was in the range of 65% to 70%. - Mitral valve: Calcified annulus. Mildly thickened leaflets .  ASSESSMENT & PLAN:   Severe AS s/p TAVR: doing well. Breathing is improved. She can walk further than before her surgery without giving out. Continue ASA only given VSD.   BZJ:IRCV today shows a small stable VSD with L--> R shunting. I will keep her off plavix for now. I will see her back with an echo on 7/18.  Complete heart block: s/p PPM. Followed by Dr. Curt Bears. Wound check next week. Pocket hematoma looks stable.   Chronic diastolic CHF: appears euvolemic off diuretics   HTN:BP well controlled today.   Right groin hematoma: she has a hard, mildly tender hematoma in her right groin. + Bruit. Will get an ultrasound to rule out pseudoaneurysm.    Medication Adjustments/Labs and Tests Ordered: Current medicines are reviewed at length with the patient today.  Concerns regarding medicines are outlined above.  Medication changes, Labs and Tests ordered today are listed in the Patient Instructions below. Patient Instructions  Your physician recommends that you continue on your current medications as directed. Please refer to the Current Medication list given to you today.  You have an appointment tomorrow at 12:30 pm at [facility] office Vascular Lab for ultrasound of your groin.  See below.  Keep follow up already scheduled.      Signed, Angelena Form, PA-C  03/27/2018 10:07 PM    [facility] [address], [place], [place]  [address] Phone: [phone]; Fax: [phone]

## 2018-03-25 NOTE — Telephone Encounter (Signed)
  [facility] VALVE TEAM   Patient contacted regarding discharge from [facility] on 03/24/18  Patient understands to follow up with provider Nell Range with an echo on 6/27 at 2pm at [facility].  Patient understands discharge instructions? yes Patient understands medications and regiment? yes Patient understands to bring all medications to this visit? yes  Angelena Form PA-C  [facility]

## 2018-03-25 NOTE — Consult Note (Signed)
            [facility] CM Primary Care Navigator  03/25/2018  NATHALI VENT [birth year] [id number]   Went to seepatient at the bedsideto identify possible discharge needs but she was alreadydischargedper staff report.  Patient was discharged home with home health services.  Per chart review, patient was admitted for severe aortic stenosis, status post Transcatheter Aortic Valve Replacement, pacemaker implantation.   Primary care provider's officeis listed as providing transition of care (TOC) follow-up.  Patient has discharge instruction to follow-up with cardiology on 03/27/18 and 04/01/18.   For additional questions please contact:  Edwena Felty A. Izaah Westman, BSN, RN-BC [facility] PRIMARY CARE Navigator Cell: [phone]

## 2018-03-26 ENCOUNTER — Telehealth: Payer: Self-pay

## 2018-03-26 NOTE — Telephone Encounter (Signed)
Pt called to confirm when she can take a shower after having her pacemaker put in. Device tech nurse advised her that it would be ok to take a shower on June 27. I also advised her not to put lotion on her chest.

## 2018-03-27 ENCOUNTER — Ambulatory Visit (INDEPENDENT_AMBULATORY_CARE_PROVIDER_SITE_OTHER): Payer: Medicare Other | Admitting: Physician Assistant

## 2018-03-27 ENCOUNTER — Ambulatory Visit (HOSPITAL_COMMUNITY): Payer: Medicare Other | Attending: Internal Medicine

## 2018-03-27 ENCOUNTER — Other Ambulatory Visit: Payer: Self-pay

## 2018-03-27 ENCOUNTER — Encounter: Payer: Self-pay | Admitting: Physician Assistant

## 2018-03-27 VITALS — BP 130/60 | HR 79 | Ht 60.0 in | Wt 119.2 lb

## 2018-03-27 DIAGNOSIS — I10 Essential (primary) hypertension: Secondary | ICD-10-CM

## 2018-03-27 DIAGNOSIS — I509 Heart failure, unspecified: Secondary | ICD-10-CM | POA: Insufficient documentation

## 2018-03-27 DIAGNOSIS — Q210 Ventricular septal defect: Secondary | ICD-10-CM

## 2018-03-27 DIAGNOSIS — I5032 Chronic diastolic (congestive) heart failure: Secondary | ICD-10-CM | POA: Diagnosis not present

## 2018-03-27 DIAGNOSIS — N189 Chronic kidney disease, unspecified: Secondary | ICD-10-CM | POA: Insufficient documentation

## 2018-03-27 DIAGNOSIS — Z952 Presence of prosthetic heart valve: Secondary | ICD-10-CM | POA: Insufficient documentation

## 2018-03-27 DIAGNOSIS — I130 Hypertensive heart and chronic kidney disease with heart failure and stage 1 through stage 4 chronic kidney disease, or unspecified chronic kidney disease: Secondary | ICD-10-CM | POA: Diagnosis not present

## 2018-03-27 DIAGNOSIS — I350 Nonrheumatic aortic (valve) stenosis: Secondary | ICD-10-CM

## 2018-03-27 DIAGNOSIS — Z950 Presence of cardiac pacemaker: Secondary | ICD-10-CM

## 2018-03-27 NOTE — Patient Instructions (Signed)
Your physician recommends that you continue on your current medications as directed. Please refer to the Current Medication list given to you today.  You have an appointment tomorrow at 12:30 pm at [facility] office Vascular Lab for ultrasound of your groin.  See below.  Keep follow up already scheduled.

## 2018-03-28 ENCOUNTER — Other Ambulatory Visit: Payer: Self-pay | Admitting: Physician Assistant

## 2018-03-28 ENCOUNTER — Ambulatory Visit (HOSPITAL_COMMUNITY)
Admission: RE | Admit: 2018-03-28 | Discharge: 2018-03-28 | Disposition: A | Discharge status: Home / Self Care | Payer: Medicare Other | Source: Ambulatory Visit | Attending: Cardiovascular Disease | Admitting: Cardiovascular Disease

## 2018-03-28 DIAGNOSIS — R1031 Right lower quadrant pain: Secondary | ICD-10-CM

## 2018-03-28 DIAGNOSIS — I708 Atherosclerosis of other arteries: Secondary | ICD-10-CM | POA: Insufficient documentation

## 2018-03-28 DIAGNOSIS — Z952 Presence of prosthetic heart valve: Secondary | ICD-10-CM | POA: Insufficient documentation

## 2018-04-01 ENCOUNTER — Other Ambulatory Visit: Payer: Self-pay | Admitting: Physician Assistant

## 2018-04-01 ENCOUNTER — Telehealth: Payer: Self-pay | Admitting: Physician Assistant

## 2018-04-01 ENCOUNTER — Ambulatory Visit (INDEPENDENT_AMBULATORY_CARE_PROVIDER_SITE_OTHER): Payer: Medicare Other | Admitting: *Deleted

## 2018-04-01 DIAGNOSIS — I5032 Chronic diastolic (congestive) heart failure: Secondary | ICD-10-CM

## 2018-04-01 DIAGNOSIS — I442 Atrioventricular block, complete: Secondary | ICD-10-CM | POA: Diagnosis not present

## 2018-04-01 LAB — CUP PACEART INCLINIC DEVICE CHECK
Battery Remaining Longevity: 139 mo
Battery Voltage: 3.13 V
Brady Statistic RA Percent Paced: 1.8 %
Date Time Interrogation Session: 20190702173752
Implantable Lead Implant Date: 20190620
Implantable Lead Location: 753860
Lead Channel Pacing Threshold Amplitude: 0.5 V
Lead Channel Pacing Threshold Amplitude: 0.75 V
Lead Channel Pacing Threshold Pulse Width: 0.5 ms
Lead Channel Pacing Threshold Pulse Width: 0.5 ms
Lead Channel Pacing Threshold Pulse Width: 0.5 ms
Lead Channel Sensing Intrinsic Amplitude: 5 mV
Lead Channel Sensing Intrinsic Amplitude: 8.7 mV
Lead Channel Setting Pacing Amplitude: 1 V
Lead Channel Setting Pacing Amplitude: 3.5 V
Lead Channel Setting Pacing Pulse Width: 0.5 ms
MDC IDC LEAD IMPLANT DT: 20190620
MDC IDC LEAD LOCATION: 753859
MDC IDC MSMT LEADCHNL RA IMPEDANCE VALUE: 425 Ohm
MDC IDC MSMT LEADCHNL RA PACING THRESHOLD AMPLITUDE: 0.5 V
MDC IDC MSMT LEADCHNL RA PACING THRESHOLD PULSEWIDTH: 0.5 ms
MDC IDC MSMT LEADCHNL RV IMPEDANCE VALUE: 550 Ohm
MDC IDC MSMT LEADCHNL RV PACING THRESHOLD AMPLITUDE: 0.75 V
MDC IDC PG IMPLANT DT: 20190620
MDC IDC SET LEADCHNL RV SENSING SENSITIVITY: 4 mV
MDC IDC STAT BRADY RV PERCENT PACED: 9.4 %
Pulse Gen Model: 2272
Pulse Gen Serial Number: 9035930

## 2018-04-01 MED ORDER — CLOPIDOGREL BISULFATE 75 MG PO TABS: 75.0000 mg | ORAL_TABLET | Freq: Every day | ORAL | 5 refills | Status: DC

## 2018-04-01 MED ORDER — FUROSEMIDE 20 MG PO TABS: 20.0000 mg | ORAL_TABLET | Freq: Every day | ORAL | 3 refills | Status: DC

## 2018-04-01 NOTE — Telephone Encounter (Signed)
  [facility]   MULTIDISCIPLINARY HEART VALVE TEAM  I discussed case with Dr. Roxy Manns and Dr. Burt Knack this morning.  It has been decided to restart Plavix 75 mg daily.  When discussing this with the patient she mentioned that she was feeling more short of breath and having orthopnea.  I will call in Lasix 20 mg daily and arrange for edema early next week.  I have also called in Plavix 75 mg daily to her pharmacy which she will pick up today.  Angelena Form PA-C  [facility]

## 2018-04-01 NOTE — Progress Notes (Signed)
Wound check appointment. Steri-strips removed. Wound without redness or edema. Incision edges approximated, wound well healed. Normal device function. Thresholds, sensing, and impedances consistent with implant measurements. Device programmed at 3.5V(RA) and RV auto capture programmed on at implant for extra safety margin until 3 month visit. Histogram distribution appropriate for patient and level of activity. (2) AMS episodes, max dur. 8sec- AT per EGMs. No high ventricular rates noted. Patient educated about wound care, arm mobility, lifting restrictions. ROV in 3 months with WC.

## 2018-04-04 ENCOUNTER — Telehealth: Payer: Self-pay | Admitting: Physician Assistant

## 2018-04-04 NOTE — Telephone Encounter (Signed)
  [facility]   MULTIDISCIPLINARY HEART VALVE TEAM   I called to check on Mrs. Kaitlin Villarreal. She is breathing better after taking the lasix. She understands she needs to get blood work next Monday at [facility] PA-C  [facility]

## 2018-04-07 DIAGNOSIS — I5032 Chronic diastolic (congestive) heart failure: Secondary | ICD-10-CM | POA: Diagnosis not present

## 2018-04-08 ENCOUNTER — Other Ambulatory Visit: Payer: Self-pay | Admitting: *Deleted

## 2018-04-08 LAB — BASIC METABOLIC PANEL
BUN/Creatinine Ratio: 20 (ref 12–28)
BUN: 27 mg/dL (ref 8–27)
CALCIUM: 9.6 mg/dL (ref 8.7–10.3)
CHLORIDE: 103 mmol/L (ref 96–106)
CO2: 23 mmol/L (ref 20–29)
Creatinine, Ser: 1.33 mg/dL — ABNORMAL HIGH (ref 0.57–1.00)
GFR calc non Af Amer: 35 mL/min/{1.73_m2} — ABNORMAL LOW (ref >59)
GFR, EST AFRICAN AMERICAN: 41 mL/min/{1.73_m2} — AB (ref >59)
Glucose: 91 mg/dL (ref 65–99)
POTASSIUM: 5.1 mmol/L (ref 3.5–5.2)
Sodium: 142 mmol/L (ref 134–144)

## 2018-04-08 MED ORDER — FUROSEMIDE 20 MG PO TABS: 20.0000 mg | ORAL_TABLET | ORAL | 3 refills | Status: DC

## 2018-04-10 DIAGNOSIS — Z950 Presence of cardiac pacemaker: Secondary | ICD-10-CM | POA: Diagnosis not present

## 2018-04-10 DIAGNOSIS — Z952 Presence of prosthetic heart valve: Secondary | ICD-10-CM | POA: Diagnosis not present

## 2018-04-11 DIAGNOSIS — Z950 Presence of cardiac pacemaker: Secondary | ICD-10-CM | POA: Diagnosis not present

## 2018-04-11 DIAGNOSIS — Z952 Presence of prosthetic heart valve: Secondary | ICD-10-CM | POA: Diagnosis not present

## 2018-04-14 DIAGNOSIS — Z952 Presence of prosthetic heart valve: Secondary | ICD-10-CM | POA: Diagnosis not present

## 2018-04-14 DIAGNOSIS — Z950 Presence of cardiac pacemaker: Secondary | ICD-10-CM | POA: Diagnosis not present

## 2018-04-15 NOTE — Progress Notes (Signed)
[facility] [place]                                       Cardiology Office Note    Date:  04/18/2018   ID:  [name], DOB [birth year], MRN [id number]  PCP:  Angelina Sheriff, MD  Cardiologist: Dr. Bettina Gavia / Dr. Burt Knack & Dr. Roxy Manns (TAVR)  CC: 1 month s/p TAVR  History of Present Illness:  Kaitlin Villarreal is a 84 y.o. female with a history of HTN, HLD, CKD and severe AS s/p TAVR (03/18/18) who presents to clinic for follow up.   The patient states that she was first noted to have a heart murmur on routine physical examination by her gynecologist nearly 20 years ago. She was eventually referred for cardiology evaluation and has been followed for several years by Dr. Bettina Gavia. She has remained reasonably active physically despite her advanced age, although she has slowed down considerably over the last few years. She has developed exertional shortness of breath and fatigue,some of which the patient has blamed on decreased activity because of problems with her back. She underwent a routine transthoracic echocardiogram on December 23, 2017 at [facility]. By report this echocardiogram revealed severe aortic stenosis with normal left ventricular systolic function. Ejection fraction was estimated greater than 70%. Peak velocity across aortic valve was reported 4.1 m/s with peak and mean transvalvular gradients estimated 69 and 42 mmHg respectively. Aortic valve area was calculated 0.87cm. Exercise stress echocardiogram was performed January 08, 2018. This confirmed the presence of severe aortic stenosis associated with hypertensive response to stress. Peak velocity across aortic valve at rest was measured 4.3 m/s corresponding to mean transvalvular gradient estimated 40 mmHg. The DVI was 0.24with aortic valve area calculated 0.76 cm. The patient was referred to Dr. Burt Knack and underwent diagnostic cardiac catheterization Feb 06, 2018. She was  found to have moderate nonobstructive coronary artery disease with normal right heart hemodynamics and preserved cardiac output.   She underwent successful TAVR with a69mm Edwards Sapien 3 THV via the TF approach on 03/18/18.Surgery was c/b a small VSD that remained stable by serial echocardiograms. She also developed CHBrequiring pacemaker placement. POD1 echo showed EF 65% and a normally functioning TAVR valve with no PVL and mean gradient not reported. [place] 03/20/18 did not show any significant L-->R shunting of the VSD. She was discharged on ASA alone with plans for a follow up echo on 03/27/18.   Follow up limited echo 03/17/18 showed stable VSD. Plavix was added back. At her follow up she complained of right groin pain. LE doppler showed no pseudoaneurysm. During a phone conversation she admitted to worsening SOB and lasix 20mg  daily was prescribed. This did help her symptoms but her creat bumped to 1.07-->1.33 and it was decreased to 20mg  QOD.  Today she presents to clinic for follow up. She has been having more back pain. She is working with cardiac rehab which makes her sore. No CP or SOB. No LE edema, orthopnea or PND. No dizziness or syncope. No blood in stool or urine. No palpitations.  Groin is healing well.     Past Medical History:  Diagnosis Date  . Acute on chronic diastolic heart failure ([place]) 03/18/2018  . Arthritis   . Bone spur of toe of left foot   . Chronic diastolic (congestive) heart failure ([place])   .  Essential hypertension   . GERD (gastroesophageal reflux disease)   . HLD (hyperlipidemia)   . Hyperlipidemia 11/25/2016  . Pancreatic mass    a. benign appearing but needs f/u, noted on pre TAVR CTs  . 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 Severe aortic stenosis    a. 03/2018: s/p TAVR by Burt Knack and  Dr. Roxy Manns  . Stage 3 chronic kidney disease ([place])   . TIA (transient ischemic attack)     a. 1992  . VSD (ventricular septal defect)     Past Surgical History:  Procedure Laterality Date  . ABDOMINAL HYSTERECTOMY    . APPENDECTOMY    . HERNIA REPAIR    . INTRAOPERATIVE TRANSTHORACIC ECHOCARDIOGRAM N/A 03/18/2018   Procedure: INTRAOPERATIVE TRANSTHORACIC ECHOCARDIOGRAM;  Surgeon: Sherren Mocha, MD;  Location: [facility];  Service: Open Heart Surgery;  Laterality: N/A;  . PACEMAKER IMPLANT N/A 03/20/2018   Procedure: PACEMAKER IMPLANT;  Surgeon: Constance Haw, MD;  Location: [facility];  Service: Cardiovascular;  Laterality: N/A;  . RIGHT HEART CATH N/A 03/20/2018   Procedure: RIGHT HEART CATH;  Surgeon: Sherren Mocha, MD;  Location: [facility];  Service: Cardiovascular;  Laterality: N/A;  . RIGHT/LEFT HEART CATH AND CORONARY ANGIOGRAPHY N/A 02/06/2018   Procedure: RIGHT/LEFT HEART CATH AND CORONARY ANGIOGRAPHY;  Surgeon: Sherren Mocha, MD;  Location: [place] [facility];  Service: Cardiovascular;  Laterality: N/A;  . TONSILLECTOMY    . TRANSCATHETER AORTIC VALVE REPLACEMENT, TRANSFEMORAL N/A 03/18/2018   Procedure: TRANSCATHETER AORTIC VALVE REPLACEMENT, TRANSFEMORAL;  Surgeon: Sherren Mocha, MD;  Location: [facility];  Service: Open Heart Surgery;  Laterality: N/A;  . WISDOM TOOTH EXTRACTION      Current Medications: Outpatient Medications Prior to Visit  Medication Sig Dispense Refill  . acetaminophen (TYLENOL) 650 MG CR tablet Take 650-1,300 mg by mouth every 8 (eight) hours as needed for pain.    Marland Kitchen amLODipine (NORVASC) 5 MG tablet Take 1 tablet (5 mg total) by mouth daily. 30 tablet 6  . aspirin EC 81 MG tablet Take 81 mg by mouth daily.     . clopidogrel (PLAVIX) 75 MG tablet Take 1 tablet (75 mg total) by mouth daily. 30 tablet 5  . estradiol (ESTRACE) 1 MG tablet Take 0.5 mg by mouth daily.    . furosemide (LASIX) 20 MG tablet Take 1 tablet (20 mg total) by mouth  every other day. 30 tablet 3  . labetalol (NORMODYNE) 100 MG tablet Take 100 mg by mouth 2 (two) times daily.     . Liniments (BLUE-EMU SUPER STRENGTH EX) Apply 1 application topically 3 (three) times daily as needed (for neck pain.).    Marland Kitchen Omega-3 Fatty Acids (FISH OIL) 1000 MG CAPS Take 1,000 mg by mouth daily.    Vladimir Faster Glycol-Propyl Glycol (SYSTANE OP) Place 1 drop into both eyes daily as needed (for dry eyes).    . pravastatin (PRAVACHOL) 20 MG tablet Take 20 mg by mouth at bedtime.     . Probiotic Product ([employer] PROBIOTIC) CHEW Chew 2 each by mouth daily after supper.     . ranitidine (ZANTAC) 300 MG tablet Take 300 mg by mouth at bedtime.     . Wheat Dextrin (BENEFIBER DRINK MIX PO) Take 1 packet by mouth daily. 1 tablespoonful daily in the morning  No facility-administered medications prior to visit.      Allergies:   Levofloxacin; Azithromycin; Clarithromycin; and Latex   Social History   Socioeconomic History  . Marital status: Married    Spouse name: Not on file  . Number of children: Not on file  . Years of education: Not on file  . Highest education level: Not on file  Occupational History  . Not on file  Social Needs  . Financial resource strain: Not on file  . Food insecurity:    Worry: Not on file    Inability: Not on file  . Transportation needs:    Medical: Not on file    Non-medical: Not on file  Tobacco Use  . Smoking status: Never Smoker  . Smokeless tobacco: Never Used  Substance and Sexual Activity  . Alcohol use: No  . Drug use: No  . Sexual activity: Not on file  Lifestyle  . Physical activity:    Days per week: Not on file    Minutes per session: Not on file  . Stress: Not on file  Relationships  . Social connections:    Talks on phone: Not on file    Gets together: Not on file    Attends religious service: Not on file    Active member of club or organization: Not on file    Attends meetings of clubs or organizations: Not on file     Relationship status: Not on file  Other Topics Concern  . Not on file  Social History Narrative  . Not on file     Family History:  The patient's family history includes Congenital heart disease in her brother; Heart attack in her brother; Heart disease in her mother; Uterine cancer in her sister.     ROS:   Please see the history of present illness.    ROS All other systems reviewed and are negative.   PHYSICAL EXAM:   VS:  BP (!) 132/56   Pulse 72   Ht 5' (1.524 m)   Wt 121 lb (54.9 kg)   SpO2 95%   BMI 23.63 kg/m    GEN: Well nourished, well developed, in no acute distress  HEENT: normal  Neck: no JVD or masses Cardiac: RRR; soft SEM @ RUSB. No rubs, or gallops,no edema  Respiratory:  clear to auscultation bilaterally, normal work of breathing GI: soft, nontender, nondistended, + BS MS: no deformity or atrophy  Skin: warm and dry, no rash. Groin hematoma improved with a small knot remaining.  Neuro:  Alert and Oriented x 3, Strength and sensation are intact Psych: euthymic mood, full affect    Wt Readings from Last 3 Encounters:  04/17/18 121 lb (54.9 kg)  03/27/18 119 lb 3.2 oz (54.1 kg)  03/24/18 118 lb (53.5 kg)      Studies/Labs Reviewed:   EKG:  EKG is NOT ordered today.   Recent Labs: 03/14/2018: ALT 15 03/19/2018: Magnesium 2.0 03/23/2018: B Natriuretic Peptide 433.4; Hemoglobin 9.6; Platelets 136 04/07/2018: BUN 27; Creatinine, Ser 1.33; Potassium 5.1; Sodium 142   Lipid Panel No results found for: CHOL, TRIG, HDL, CHOLHDL, VLDL, LDLCALC, LDLDIRECT  Additional studies/ records that were reviewed today include:  TAVR OPERATIVE NOTE   Date of Procedure:03/18/2018  Preoperative Diagnosis:Severe Aortic Stenosis   Procedure:   Transcatheter Aortic Valve Replacement - PercutaneousRightTransfemoral Approach Edwards Sapien 3 THV (size 22mm, model # 9600TFX, serial #  [id number])  Co-Surgeons:Clarence H. Roxy Manns, MD and Sherren Mocha, MD  Pre-operative Echo  Findings: ? Severe aortic stenosis ? Normalleft ventricular systolic function  Post-operative Echo Findings: ? Noparavalvular leak ? Very small perimembranous ventricular septal defect ? No pericardial effusion ? Normalleft ventricular systolic function  _______________  Limited echo 03/18/18 5 hours later a limited TTE was performed. It showed unchanged small VSD, stable aortic valve with normal unchanged gradients. No paravalvular leak. Trivial pericardial effusion  _______________  Post operative echo 03/19/18 Study Conclusions - Left ventricle: The cavity size was normal. Wall thickness was increased in a pattern of severe LVH. There was focal basal hypertrophy. Systolic function was vigorous. The estimated ejection fraction was in the range of 65% to 70%. Wall motion was normal; there were no regional wall motion abnormalities. Doppler parameters are consistent with abnormal left ventricular relaxation (grade 1 diastolic dysfunction). - Aortic valve: A bioprosthesis was present. - Mitral valve: Mildly calcified annulus. Moderately thickened, moderately calcified leaflets . There was mild regurgitation. - Pulmonary arteries: Systolic pressure was moderately increased. PA peak pressure: 53 mm Hg (S).  _______________  03/21/18 RIGHT HEART CATH  Conclusion   1. No significant left to right shunting based on absence of a significant O2 step up from the SVC to the pulmonary artery 2. Elevated right and left heart intracardiac filling pressures consistent with acute on chronic diastolic heart failure  Plan: Serial echo follow-up of the patient's small perimembranous VSD, IV diuresis and medical therapy for treatment of congestive heart failure, permanent pacemaker placement per Dr. Curt Bears to follow     _______________   03/20/18 SURGEON: Allegra Lai, MD   PREPROCEDURE DIAGNOSIS:Complete AV block  POSTPROCEDURE DIAGNOSIS:Complete AV block  PROCEDURES:  1. Pacemaker implantation. 2. Left upper extremity venography.    _______________  Limited echo 03/27/18 Study Conclusions - Left ventricle: Small perimembranous VSD with L to R flow. The cavity size was normal. Wall thickness was normal. Systolic function was vigorous. The estimated ejection fraction was in the range of 65% to 70%. - Mitral valve: Calcified annulus. Mildly thickened leaflets .  _______________  2D ECHO 04/17/18 (1 month s/p TAVR) Study Conclusions - Left ventricle: The cavity size was normal. Wall thickness was   increased in a pattern of moderate LVH. Systolic function was   normal. The estimated ejection fraction was in the range of 60%   to 65%. Wall motion was normal; there were no regional wall   motion abnormalities. Features are consistent with a pseudonormal   left ventricular filling pattern, with concomitant abnormal   relaxation and increased filling pressure (grade 2 diastolic   dysfunction). - Aortic valve: A bioprosthesis was present. There was trivial   regurgitation. Valve area (VTI): 2.93 cm^2. Valve area (Vmax):   2.14 cm^2. Valve area (Vmean): 2.5 cm^2. - Mitral valve: There was mild regurgitation. - Left atrium: The atrium was moderately dilated. - Pulmonary arteries: Systolic pressure was moderately to severely   increased. PA peak pressure: 66 mm Hg (S).   ASSESSMENT & PLAN:   Severe AS s/p TAVR: 2D ECHO today shows EF 60-65%, normally functioning TAVR with trivial PVL and mean gradient of 25mmHg. She has NYHA class I symptoms. SBE prophylaxis discussed; she takes Amoxil. Plavix can be discontinued after 6 months of therapy (08/2018)   VSD:not mentioned on echo today. Will have Dr. Roxy Manns and Dr. Burt Knack review echo to assess for VSD.   Complete  heart block: s/p PPM.  followed by Dr. Curt Bears.   Chronic diastolic CHF: appears euvolemic. Continue lasix qod  HTN:BP well controlled today.  Continue current regimen  Medication Adjustments/Labs and Tests Ordered: Current medicines are reviewed at length with the patient today.  Concerns regarding medicines are outlined above.  Medication changes, Labs and Tests ordered today are listed in the Patient Instructions below. Patient Instructions  Medication Instructions:  Your physician has recommended you make the following change in your medication:   STOP Plavix September 23, 2018   Labwork: None Ordered   Testing/Procedures: Your physician has requested that you have an echocardiogram in 1 year before your appointment with Nell Range, PA. Echocardiography is a painless test that uses sound waves to create images of your heart. It provides your doctor with information about the size and shape of your heart and how well your heart's chambers and valves are working. This procedure takes approximately one hour. There are no restrictions for this procedure.    Follow-Up: Your physician recommends that you schedule a follow-up appointment in: 3-4 months with Dr. Bettina Gavia   Your physician wants you to follow-up in: 1 year with Nell Range, PA. You will receive a reminder letter in the mail two months in advance. If you don't receive a letter, please call our office to schedule the follow-up appointment.   If you need a refill on your cardiac medications before your next appointment, please call your pharmacy.   Thank you for choosing [facility]! Christen Bame, RN [phone]       Signed, Angelena Form, PA-C  04/18/2018 9:33 AM    [facility] [address], [place], [place]  [address] Phone: [phone]; Fax: [phone]

## 2018-04-17 ENCOUNTER — Other Ambulatory Visit: Payer: Self-pay

## 2018-04-17 ENCOUNTER — Other Ambulatory Visit: Payer: Self-pay | Admitting: Physician Assistant

## 2018-04-17 ENCOUNTER — Encounter: Payer: Self-pay | Admitting: Physician Assistant

## 2018-04-17 ENCOUNTER — Ambulatory Visit (INDEPENDENT_AMBULATORY_CARE_PROVIDER_SITE_OTHER): Payer: Medicare Other | Admitting: Physician Assistant

## 2018-04-17 ENCOUNTER — Ambulatory Visit (HOSPITAL_COMMUNITY): Payer: Medicare Other | Attending: Cardiovascular Disease

## 2018-04-17 VITALS — BP 132/56 | HR 72 | Ht 60.0 in | Wt 121.0 lb

## 2018-04-17 DIAGNOSIS — I10 Essential (primary) hypertension: Secondary | ICD-10-CM | POA: Diagnosis not present

## 2018-04-17 DIAGNOSIS — I350 Nonrheumatic aortic (valve) stenosis: Secondary | ICD-10-CM | POA: Diagnosis not present

## 2018-04-17 DIAGNOSIS — G459 Transient cerebral ischemic attack, unspecified: Secondary | ICD-10-CM | POA: Diagnosis not present

## 2018-04-17 DIAGNOSIS — I5032 Chronic diastolic (congestive) heart failure: Secondary | ICD-10-CM

## 2018-04-17 DIAGNOSIS — E785 Hyperlipidemia, unspecified: Secondary | ICD-10-CM | POA: Diagnosis not present

## 2018-04-17 DIAGNOSIS — Q210 Ventricular septal defect: Secondary | ICD-10-CM | POA: Diagnosis not present

## 2018-04-17 DIAGNOSIS — K869 Disease of pancreas, unspecified: Secondary | ICD-10-CM

## 2018-04-17 DIAGNOSIS — Z952 Presence of prosthetic heart valve: Secondary | ICD-10-CM | POA: Diagnosis not present

## 2018-04-17 DIAGNOSIS — Z950 Presence of cardiac pacemaker: Secondary | ICD-10-CM | POA: Diagnosis not present

## 2018-04-17 HISTORY — DX: Disease of pancreas, unspecified: K86.9

## 2018-04-17 NOTE — Patient Instructions (Addendum)
Medication Instructions:  Your physician has recommended you make the following change in your medication:   STOP Plavix September 23, 2018   Labwork: None Ordered   Testing/Procedures: Your physician has requested that you have an echocardiogram in 1 year before your appointment with Nell Range, PA. Echocardiography is a painless test that uses sound waves to create images of your heart. It provides your doctor with information about the size and shape of your heart and how well your heart's chambers and valves are working. This procedure takes approximately one hour. There are no restrictions for this procedure.    Follow-Up: Your physician recommends that you schedule a follow-up appointment in: 3-4 months with Dr. Bettina Gavia   Your physician wants you to follow-up in: 1 year with Nell Range, PA. You will receive a reminder letter in the mail two months in advance. If you don't receive a letter, please call our office to schedule the follow-up appointment.   If you need a refill on your cardiac medications before your next appointment, please call your pharmacy.   Thank you for choosing [facility]! Christen Bame, RN [phone]

## 2018-04-18 DIAGNOSIS — Z950 Presence of cardiac pacemaker: Secondary | ICD-10-CM | POA: Diagnosis not present

## 2018-04-18 DIAGNOSIS — Z952 Presence of prosthetic heart valve: Secondary | ICD-10-CM | POA: Diagnosis not present

## 2018-04-21 DIAGNOSIS — Z952 Presence of prosthetic heart valve: Secondary | ICD-10-CM | POA: Diagnosis not present

## 2018-04-21 DIAGNOSIS — Z950 Presence of cardiac pacemaker: Secondary | ICD-10-CM | POA: Diagnosis not present

## 2018-04-23 DIAGNOSIS — Z952 Presence of prosthetic heart valve: Secondary | ICD-10-CM | POA: Diagnosis not present

## 2018-04-23 DIAGNOSIS — Z950 Presence of cardiac pacemaker: Secondary | ICD-10-CM | POA: Diagnosis not present

## 2018-04-25 DIAGNOSIS — Z950 Presence of cardiac pacemaker: Secondary | ICD-10-CM | POA: Diagnosis not present

## 2018-04-25 DIAGNOSIS — Z952 Presence of prosthetic heart valve: Secondary | ICD-10-CM | POA: Diagnosis not present

## 2018-04-28 DIAGNOSIS — Z952 Presence of prosthetic heart valve: Secondary | ICD-10-CM | POA: Diagnosis not present

## 2018-04-28 DIAGNOSIS — Z950 Presence of cardiac pacemaker: Secondary | ICD-10-CM | POA: Diagnosis not present

## 2018-04-30 DIAGNOSIS — Z952 Presence of prosthetic heart valve: Secondary | ICD-10-CM | POA: Diagnosis not present

## 2018-04-30 DIAGNOSIS — Z950 Presence of cardiac pacemaker: Secondary | ICD-10-CM | POA: Diagnosis not present

## 2018-05-01 ENCOUNTER — Other Ambulatory Visit: Payer: Self-pay

## 2018-05-02 DIAGNOSIS — Z950 Presence of cardiac pacemaker: Secondary | ICD-10-CM | POA: Diagnosis not present

## 2018-05-02 DIAGNOSIS — Z954 Presence of other heart-valve replacement: Secondary | ICD-10-CM | POA: Diagnosis not present

## 2018-05-05 ENCOUNTER — Encounter: Payer: Self-pay | Admitting: Thoracic Surgery (Cardiothoracic Vascular Surgery)

## 2018-05-05 DIAGNOSIS — Z954 Presence of other heart-valve replacement: Secondary | ICD-10-CM | POA: Diagnosis not present

## 2018-05-05 DIAGNOSIS — Z950 Presence of cardiac pacemaker: Secondary | ICD-10-CM | POA: Diagnosis not present

## 2018-05-07 DIAGNOSIS — Z954 Presence of other heart-valve replacement: Secondary | ICD-10-CM | POA: Diagnosis not present

## 2018-05-07 DIAGNOSIS — Z950 Presence of cardiac pacemaker: Secondary | ICD-10-CM | POA: Diagnosis not present

## 2018-05-09 DIAGNOSIS — Z954 Presence of other heart-valve replacement: Secondary | ICD-10-CM | POA: Diagnosis not present

## 2018-05-09 DIAGNOSIS — Z950 Presence of cardiac pacemaker: Secondary | ICD-10-CM | POA: Diagnosis not present

## 2018-05-12 DIAGNOSIS — Z950 Presence of cardiac pacemaker: Secondary | ICD-10-CM | POA: Diagnosis not present

## 2018-05-12 DIAGNOSIS — Z954 Presence of other heart-valve replacement: Secondary | ICD-10-CM | POA: Diagnosis not present

## 2018-05-14 ENCOUNTER — Encounter: Payer: Self-pay | Admitting: Thoracic Surgery (Cardiothoracic Vascular Surgery)

## 2018-05-14 DIAGNOSIS — Z954 Presence of other heart-valve replacement: Secondary | ICD-10-CM | POA: Diagnosis not present

## 2018-05-14 DIAGNOSIS — Z950 Presence of cardiac pacemaker: Secondary | ICD-10-CM | POA: Diagnosis not present

## 2018-05-16 DIAGNOSIS — Z950 Presence of cardiac pacemaker: Secondary | ICD-10-CM | POA: Diagnosis not present

## 2018-05-16 DIAGNOSIS — Z954 Presence of other heart-valve replacement: Secondary | ICD-10-CM | POA: Diagnosis not present

## 2018-05-19 DIAGNOSIS — Z954 Presence of other heart-valve replacement: Secondary | ICD-10-CM | POA: Diagnosis not present

## 2018-05-19 DIAGNOSIS — Z950 Presence of cardiac pacemaker: Secondary | ICD-10-CM | POA: Diagnosis not present

## 2018-05-21 DIAGNOSIS — Z950 Presence of cardiac pacemaker: Secondary | ICD-10-CM | POA: Diagnosis not present

## 2018-05-21 DIAGNOSIS — Z954 Presence of other heart-valve replacement: Secondary | ICD-10-CM | POA: Diagnosis not present

## 2018-05-23 DIAGNOSIS — Z954 Presence of other heart-valve replacement: Secondary | ICD-10-CM | POA: Diagnosis not present

## 2018-05-23 DIAGNOSIS — Z950 Presence of cardiac pacemaker: Secondary | ICD-10-CM | POA: Diagnosis not present

## 2018-05-26 DIAGNOSIS — Z950 Presence of cardiac pacemaker: Secondary | ICD-10-CM | POA: Diagnosis not present

## 2018-05-26 DIAGNOSIS — Z954 Presence of other heart-valve replacement: Secondary | ICD-10-CM | POA: Diagnosis not present

## 2018-05-28 DIAGNOSIS — Z950 Presence of cardiac pacemaker: Secondary | ICD-10-CM | POA: Diagnosis not present

## 2018-05-28 DIAGNOSIS — Z954 Presence of other heart-valve replacement: Secondary | ICD-10-CM | POA: Diagnosis not present

## 2018-05-30 DIAGNOSIS — Z954 Presence of other heart-valve replacement: Secondary | ICD-10-CM | POA: Diagnosis not present

## 2018-05-30 DIAGNOSIS — Z950 Presence of cardiac pacemaker: Secondary | ICD-10-CM | POA: Diagnosis not present

## 2018-06-03 DIAGNOSIS — Z954 Presence of other heart-valve replacement: Secondary | ICD-10-CM | POA: Diagnosis not present

## 2018-06-04 DIAGNOSIS — Z954 Presence of other heart-valve replacement: Secondary | ICD-10-CM | POA: Diagnosis not present

## 2018-06-06 ENCOUNTER — Encounter: Payer: Self-pay | Admitting: Cardiology

## 2018-06-06 DIAGNOSIS — Z954 Presence of other heart-valve replacement: Secondary | ICD-10-CM | POA: Diagnosis not present

## 2018-06-09 DIAGNOSIS — Z954 Presence of other heart-valve replacement: Secondary | ICD-10-CM | POA: Diagnosis not present

## 2018-06-11 DIAGNOSIS — Z954 Presence of other heart-valve replacement: Secondary | ICD-10-CM | POA: Diagnosis not present

## 2018-06-13 DIAGNOSIS — Z954 Presence of other heart-valve replacement: Secondary | ICD-10-CM | POA: Diagnosis not present

## 2018-06-16 DIAGNOSIS — Z954 Presence of other heart-valve replacement: Secondary | ICD-10-CM | POA: Diagnosis not present

## 2018-06-18 DIAGNOSIS — Z954 Presence of other heart-valve replacement: Secondary | ICD-10-CM | POA: Diagnosis not present

## 2018-06-20 ENCOUNTER — Ambulatory Visit (INDEPENDENT_AMBULATORY_CARE_PROVIDER_SITE_OTHER): Payer: Medicare Other | Admitting: Cardiology

## 2018-06-20 ENCOUNTER — Encounter: Payer: Self-pay | Admitting: Cardiology

## 2018-06-20 VITALS — BP 110/60 | HR 67 | Ht 60.0 in | Wt 119.0 lb

## 2018-06-20 DIAGNOSIS — Z952 Presence of prosthetic heart valve: Secondary | ICD-10-CM | POA: Diagnosis not present

## 2018-06-20 DIAGNOSIS — I10 Essential (primary) hypertension: Secondary | ICD-10-CM | POA: Diagnosis not present

## 2018-06-20 DIAGNOSIS — I5032 Chronic diastolic (congestive) heart failure: Secondary | ICD-10-CM | POA: Diagnosis not present

## 2018-06-20 DIAGNOSIS — I442 Atrioventricular block, complete: Secondary | ICD-10-CM

## 2018-06-20 NOTE — Progress Notes (Signed)
Electrophysiology Office Note   Date:  06/20/2018   ID:  Kaitlin Villarreal, DOB Sep 09, 1926, MRN [id number]  PCP:  Angelina Sheriff, MD  Cardiologist:  Bettina Gavia Primary Electrophysiologist:  Dorita Rowlands Meredith Leeds, MD    No chief complaint on file.    History of Present Illness: Kaitlin Villarreal is a 84 y.o. female who is being seen today for the evaluation of complete heart block at the request of Shirlee More. Presenting today for electrophysiology evaluation.  She has a history of severe aortic stenosis status post TAVR, VSD, complete heart block status post [place] pacemaker 10/15/7260, chronic diastolic heart failure, hypertension.    Today, she denies symptoms of palpitations, chest pain, shortness of breath, orthopnea, PND, lower extremity edema, claudication, dizziness, presyncope, syncope, bleeding, or neurologic sequela. The patient is tolerating medications without difficulties.    Past Medical History:  Diagnosis Date  . Acute on chronic diastolic heart failure ([place]) 03/18/2018  . Arthritis   . Bone spur of toe of left foot   . Chronic diastolic (congestive) heart failure ([place])   . Essential hypertension   . GERD (gastroesophageal reflux disease)   . HLD (hyperlipidemia)   . Hyperlipidemia 11/25/2016  . Pancreatic mass    a. benign appearing but needs f/u, noted on pre TAVR CTs  . 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 Severe aortic stenosis    a. 03/2018: s/p TAVR by Burt Knack and Dr. Roxy Manns  . Stage 3 chronic kidney disease ([place])   . TIA (transient ischemic attack)     a. 1992  . VSD (ventricular septal defect)    Past Surgical History:  Procedure Laterality Date  . ABDOMINAL HYSTERECTOMY    . APPENDECTOMY    . HERNIA REPAIR    . INTRAOPERATIVE TRANSTHORACIC ECHOCARDIOGRAM N/A 03/18/2018   Procedure:  INTRAOPERATIVE TRANSTHORACIC ECHOCARDIOGRAM;  Surgeon: Sherren Mocha, MD;  Location: [facility];  Service: Open Heart Surgery;  Laterality: N/A;  . PACEMAKER IMPLANT N/A 03/20/2018   Procedure: PACEMAKER IMPLANT;  Surgeon: Constance Haw, MD;  Location: [facility];  Service: Cardiovascular;  Laterality: N/A;  . RIGHT HEART CATH N/A 03/20/2018   Procedure: RIGHT HEART CATH;  Surgeon: Sherren Mocha, MD;  Location: [facility];  Service: Cardiovascular;  Laterality: N/A;  . RIGHT/LEFT HEART CATH AND CORONARY ANGIOGRAPHY N/A 02/06/2018   Procedure: RIGHT/LEFT HEART CATH AND CORONARY ANGIOGRAPHY;  Surgeon: Sherren Mocha, MD;  Location: [facility];  Service: Cardiovascular;  Laterality: N/A;  . TONSILLECTOMY    . TRANSCATHETER AORTIC VALVE REPLACEMENT, TRANSFEMORAL N/A 03/18/2018   Procedure: TRANSCATHETER AORTIC VALVE REPLACEMENT, TRANSFEMORAL;  Surgeon: Sherren Mocha, MD;  Location: [facility];  Service: Open Heart Surgery;  Laterality: N/A;  . WISDOM TOOTH EXTRACTION       Current Outpatient Medications  Medication Sig Dispense Refill  . acetaminophen (TYLENOL) 650 MG CR tablet Take 650-1,300 mg by mouth every 8 (eight) hours as needed for pain.    Marland Kitchen amLODipine (NORVASC) 5 MG tablet Take 1 tablet (5 mg total) by mouth daily. 30 tablet 6  . aspirin EC 81 MG tablet Take 81 mg by mouth daily.     . clopidogrel (PLAVIX) 75 MG tablet Take 1 tablet (75 mg total) by mouth daily. 30 tablet 5  . estradiol (ESTRACE) 1  MG tablet Take 0.5 mg by mouth daily.    . furosemide (LASIX) 20 MG tablet Take 1 tablet (20 mg total) by mouth every other day. 30 tablet 3  . labetalol (NORMODYNE) 100 MG tablet Take 100 mg by mouth 2 (two) times daily.     . Liniments (BLUE-EMU SUPER STRENGTH EX) Apply 1 application topically 3 (three) times daily as needed (for neck pain.).    Marland Kitchen Omega-3 Fatty Acids (FISH OIL) 1000 MG CAPS Take 1,000 mg by mouth daily.    Vladimir Faster Glycol-Propyl Glycol (SYSTANE  OP) Place 1 drop into both eyes daily as needed (for dry eyes).    . pravastatin (PRAVACHOL) 20 MG tablet Take 20 mg by mouth at bedtime.     . Probiotic Product (CVS PROBIOTIC) CHEW Chew 2 each by mouth daily after supper.     . ranitidine (ZANTAC) 300 MG tablet Take 300 mg by mouth at bedtime.     . Wheat Dextrin (BENEFIBER DRINK MIX PO) Take 1 packet by mouth daily. 1 tablespoonful daily in the morning     No current facility-administered medications for this visit.     Allergies:   Levofloxacin; Azithromycin; Clarithromycin; and Latex   Social History:  The patient  reports that she has never smoked. She has never used smokeless tobacco. She reports that she does not drink alcohol or use drugs.   Family History:  The patient's family history includes Congenital heart disease in her brother; Heart attack in her brother; Heart disease in her mother; Uterine cancer in her sister.    ROS:  Please see the history of present illness.   Otherwise, review of systems is positive for swelling, back pain, easy bruising, bleeding.   All other systems are reviewed and negative.    PHYSICAL EXAM: VS:  BP 110/60   Pulse 67   Ht 5' (1.524 m)   Wt 119 lb (54 kg)   BMI 23.24 kg/m  , BMI Body mass index is 23.24 kg/m. GEN: Well nourished, well developed, in no acute distress  HEENT: normal  Neck: no JVD, carotid bruits, or masses Cardiac: RRR; no murmurs, rubs, or gallops,no edema  Respiratory:  clear to auscultation bilaterally, normal work of breathing GI: soft, nontender, nondistended, + BS MS: no deformity or atrophy  Skin: warm and dry, device pocket is well healed Neuro:  Strength and sensation are intact Psych: euthymic mood, full affect  EKG:  EKG is ordered today. Personal review of the ekg ordered shows this rhythm, rate 67  Device interrogation is reviewed today in detail.  See PaceArt for details.   Recent Labs: 03/14/2018: ALT 15 03/19/2018: Magnesium 2.0 03/23/2018: B  Natriuretic Peptide 433.4; Hemoglobin 9.6; Platelets 136 04/07/2018: BUN 27; Creatinine, Ser 1.33; Potassium 5.1; Sodium 142    Lipid Panel  No results found for: CHOL, TRIG, HDL, CHOLHDL, VLDL, LDLCALC, LDLDIRECT   Wt Readings from Last 3 Encounters:  06/20/18 119 lb (54 kg)  04/17/18 121 lb (54.9 kg)  03/27/18 119 lb 3.2 oz (54.1 kg)      Other studies Reviewed: Additional studies/ records that were reviewed today include: TTE 05/05/18  Review of the above records today demonstrates:  - Left ventricle: The cavity size was normal. Wall thickness was   increased in a pattern of moderate LVH. Systolic function was   normal. The estimated ejection fraction was in the range of 60%   to 65%. Wall motion was normal; there were no regional wall  motion abnormalities. Features are consistent with a pseudonormal   left ventricular filling pattern, with concomitant abnormal   relaxation and increased filling pressure (grade 2 diastolic   dysfunction). - Aortic valve: A bioprosthesis was present. There was trivial   regurgitation. Valve area (VTI): 2.93 cm^2. Valve area (Vmax):   2.14 cm^2. Valve area (Vmean): 2.5 cm^2. - Mitral valve: There was mild regurgitation. - Left atrium: The atrium was moderately dilated. - Pulmonary arteries: Systolic pressure was moderately to severely   increased. PA peak pressure: 66 mm Hg (S).   ASSESSMENT AND PLAN:  1.  Complete heart block: Status post [facility] Jude dual-chamber pacemaker.  Device functioning appropriately.  No changes.  2.  Severe aortic stenosis: Status post TAVR.  Plan per primary cardiology.  3.  Chronic diastolic heart failure: No signs of volume overload  4.  Hypertension: Currently well controlled  Current medicines are reviewed at length with the patient today.   The patient does not have concerns regarding her medicines.  The following changes were made today:  none  Labs/ tests ordered today include:  Orders Placed This  Encounter  Procedures  . EKG 12-Lead     Disposition:   FU with Hildegard Hlavac 9 months  Signed, Particia Strahm Meredith Leeds, MD  06/20/2018 12:36 PM     [address] [place] [address] [phone] (office) [phone] (fax)

## 2018-06-23 DIAGNOSIS — Z954 Presence of other heart-valve replacement: Secondary | ICD-10-CM | POA: Diagnosis not present

## 2018-06-24 DIAGNOSIS — Z23 Encounter for immunization: Secondary | ICD-10-CM | POA: Diagnosis not present

## 2018-06-24 DIAGNOSIS — Z79899 Other long term (current) drug therapy: Secondary | ICD-10-CM | POA: Diagnosis not present

## 2018-06-24 DIAGNOSIS — Z1339 Encounter for screening examination for other mental health and behavioral disorders: Secondary | ICD-10-CM | POA: Diagnosis not present

## 2018-06-24 DIAGNOSIS — I10 Essential (primary) hypertension: Secondary | ICD-10-CM | POA: Diagnosis not present

## 2018-06-24 DIAGNOSIS — R5383 Other fatigue: Secondary | ICD-10-CM | POA: Diagnosis not present

## 2018-06-24 DIAGNOSIS — Z0000 Encounter for general adult medical examination without abnormal findings: Secondary | ICD-10-CM | POA: Diagnosis not present

## 2018-06-24 DIAGNOSIS — E559 Vitamin D deficiency, unspecified: Secondary | ICD-10-CM | POA: Diagnosis not present

## 2018-06-24 DIAGNOSIS — K219 Gastro-esophageal reflux disease without esophagitis: Secondary | ICD-10-CM | POA: Diagnosis not present

## 2018-06-24 DIAGNOSIS — Z1331 Encounter for screening for depression: Secondary | ICD-10-CM | POA: Diagnosis not present

## 2018-06-24 DIAGNOSIS — Z6823 Body mass index (BMI) 23.0-23.9, adult: Secondary | ICD-10-CM | POA: Diagnosis not present

## 2018-06-25 DIAGNOSIS — Z954 Presence of other heart-valve replacement: Secondary | ICD-10-CM | POA: Diagnosis not present

## 2018-06-27 DIAGNOSIS — Z954 Presence of other heart-valve replacement: Secondary | ICD-10-CM | POA: Diagnosis not present

## 2018-06-30 DIAGNOSIS — Z954 Presence of other heart-valve replacement: Secondary | ICD-10-CM | POA: Diagnosis not present

## 2018-07-02 DIAGNOSIS — Z954 Presence of other heart-valve replacement: Secondary | ICD-10-CM | POA: Diagnosis not present

## 2018-07-23 ENCOUNTER — Telehealth: Payer: Self-pay | Admitting: Physician Assistant

## 2018-07-23 NOTE — Telephone Encounter (Signed)
  [facility]   MULTIDISCIPLINARY HEART VALVE TEAM  Called to check in on Kaitlin Villarreal given history of VSD s/p TAVR. She is doing well. Just finished cardiac rehab. She hasn't had any chest pain or shortness of breath. She has no complaints and very pleased with her progress. She just celebrated her 89th birthday last month. She sees Dr. Bettina Gavia in December. We discussed how she can stop taking her plavix in late December.   Angelena Form PA-C  [facility]

## 2018-08-19 DIAGNOSIS — E785 Hyperlipidemia, unspecified: Secondary | ICD-10-CM | POA: Diagnosis not present

## 2018-08-19 DIAGNOSIS — I350 Nonrheumatic aortic (valve) stenosis: Secondary | ICD-10-CM | POA: Diagnosis not present

## 2018-08-19 DIAGNOSIS — D631 Anemia in chronic kidney disease: Secondary | ICD-10-CM | POA: Diagnosis not present

## 2018-08-19 DIAGNOSIS — N183 Chronic kidney disease, stage 3 (moderate): Secondary | ICD-10-CM | POA: Diagnosis not present

## 2018-08-19 DIAGNOSIS — Z950 Presence of cardiac pacemaker: Secondary | ICD-10-CM | POA: Diagnosis not present

## 2018-08-19 DIAGNOSIS — Q210 Ventricular septal defect: Secondary | ICD-10-CM | POA: Diagnosis not present

## 2018-08-19 DIAGNOSIS — K8689 Other specified diseases of pancreas: Secondary | ICD-10-CM | POA: Diagnosis not present

## 2018-08-19 DIAGNOSIS — I129 Hypertensive chronic kidney disease with stage 1 through stage 4 chronic kidney disease, or unspecified chronic kidney disease: Secondary | ICD-10-CM | POA: Diagnosis not present

## 2018-08-19 DIAGNOSIS — N2581 Secondary hyperparathyroidism of renal origin: Secondary | ICD-10-CM | POA: Diagnosis not present

## 2018-08-22 DIAGNOSIS — H524 Presbyopia: Secondary | ICD-10-CM | POA: Diagnosis not present

## 2018-08-22 DIAGNOSIS — H25813 Combined forms of age-related cataract, bilateral: Secondary | ICD-10-CM | POA: Diagnosis not present

## 2018-08-23 ENCOUNTER — Other Ambulatory Visit: Payer: Self-pay | Admitting: Physician Assistant

## 2018-08-26 DIAGNOSIS — L570 Actinic keratosis: Secondary | ICD-10-CM | POA: Diagnosis not present

## 2018-08-26 DIAGNOSIS — L578 Other skin changes due to chronic exposure to nonionizing radiation: Secondary | ICD-10-CM | POA: Diagnosis not present

## 2018-08-26 DIAGNOSIS — L821 Other seborrheic keratosis: Secondary | ICD-10-CM | POA: Diagnosis not present

## 2018-09-01 NOTE — Progress Notes (Signed)
Cardiology Office Note:    Date:  09/02/2018   ID:  Kaitlin Villarreal, DOB [birth year], MRN [id number]  PCP:  Angelina Sheriff, MD  Cardiologist:  Shirlee More, MD    Referring MD: Angelina Sheriff, MD    ASSESSMENT:    1. S/P TAVR (transcatheter aortic valve replacement)   2. Essential hypertension   3. CKD (chronic kidney disease) stage 3, GFR 30-59 ml/min (HCC)   4. Acute on chronic diastolic heart failure ([place])   5. Pacemaker    PLAN:    In order of problems listed above:  1. She has had a complicated course but is made a good functional recovery.  She has a small residual VSD of no hemodynamic significance and a late developed bradycardia and required pacemaker in that setting had acute heart failure.  Although she is asymptomatic I am concerned about her edema and will increase the dose of her diuretic check renal function and proBNP today and recheck at her next visit. 2. Stable blood pressure target continue current treatment 3. Recheck renal function if BNP level is not elevated I will reduce her diuretics his edema could be due to her calcium channel blocker 4. Asymptomatic New York Heart Association class I she is edematous on a calcium channel blocker increase her diuretic and check proBNP level 5. Stable function occurred late after TAVR continue to follow in our device clinic she maintains sinus rhythm   Next appointment: 3 months   Medication Adjustments/Labs and Tests Ordered: Current medicines are reviewed at length with the patient today.  Concerns regarding medicines are outlined above.  Orders Placed This Encounter  Procedures  . Basic Metabolic Panel (BMET)  . Pro b natriuretic peptide (BNP)   Meds ordered this encounter  Medications  . DISCONTD: furosemide (LASIX) 20 MG tablet    Sig: Take 1 tablet (20 mg total) by mouth every other day. Take an extra tablet on Monday, Wednesday, and Friday.    Dispense:  90 tablet    Refill:  0  . DISCONTD:  furosemide (LASIX) 20 MG tablet    Sig: Take 1 tablet (20 mg total) by mouth every other day. Take an extra tablet on Monday, Wednesday, and Friday.    Dispense:  90 tablet    Refill:  0  . furosemide (LASIX) 20 MG tablet    Sig: Take 1 tablet (20 mg total) by mouth every other day. Take an extra tablet on Monday, Wednesday, and Friday.    Dispense:  90 tablet    Refill:  0    No chief complaint on file.   History of Present Illness:    Kaitlin Villarreal is a 84 y.o. female with a hx of aortic stenosis status post TAVR, VSD, complete heart block status post [place] pacemaker 10/01/7508, chronic diastolic heart failure, hypertension  last seen 06/20/18 Dr Curt Bears EP.Marland Kitchen Compliance with diet, lifestyle and medications: Yes This is her first visit to me after TAVR and I reviewed extensive records including procedural recurrence of VSD post procedure small nonhemodynamically significant bradycardia requiring pacemaker and decompensated heart failure in the setting of bradycardia. She has completed cardiac rehabilitation at [facility].  Overall she feels as if she is recovered but she does notice peripheral edema and unfortunately does not weigh daily although she sodium restricts.  She has had no orthopnea exertional dyspnea chest pain palpitations syncope or pacemaker awareness.  She is aware that she has CKD.  Recent device  download from pacemaker clinic reviewed prior to visit normal device function Past Medical History:  Diagnosis Date  . Acute on chronic diastolic heart failure ([place]) 03/18/2018  . Arthritis   . Bone spur of toe of left foot   . Chronic diastolic (congestive) heart failure ([place])   . Essential hypertension   . GERD (gastroesophageal reflux disease)   . HLD (hyperlipidemia)   . Hyperlipidemia 11/25/2016  . Pancreatic mass    a. benign appearing but needs f/u, noted on pre TAVR CTs  . 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 S/P TAVR (transcatheter aortic valve  replacement) 03/18/2018   23 mm Edwards Sapien 3 transcatheter heart valve placed via percutaneous right transfemoral approach   . Severe aortic stenosis    a. 03/2018: s/p TAVR by Burt Knack and Dr. Roxy Manns  . Stage 3 chronic kidney disease ([place])   . TIA (transient ischemic attack)     a. 1992  . VSD (ventricular septal defect)     Past Surgical History:  Procedure Laterality Date  . ABDOMINAL HYSTERECTOMY    . APPENDECTOMY    . HERNIA REPAIR    . INTRAOPERATIVE TRANSTHORACIC ECHOCARDIOGRAM N/A 03/18/2018   Procedure: INTRAOPERATIVE TRANSTHORACIC ECHOCARDIOGRAM;  Surgeon: Sherren Mocha, MD;  Location: [facility];  Service: Open Heart Surgery;  Laterality: N/A;  . PACEMAKER IMPLANT N/A 03/20/2018   Procedure: PACEMAKER IMPLANT;  Surgeon: Constance Haw, MD;  Location: [facility];  Service: Cardiovascular;  Laterality: N/A;  . RIGHT HEART CATH N/A 03/20/2018   Procedure: RIGHT HEART CATH;  Surgeon: Sherren Mocha, MD;  Location: [facility];  Service: Cardiovascular;  Laterality: N/A;  . RIGHT/LEFT HEART CATH AND CORONARY ANGIOGRAPHY N/A 02/06/2018   Procedure: RIGHT/LEFT HEART CATH AND CORONARY ANGIOGRAPHY;  Surgeon: Sherren Mocha, MD;  Location: [facility];  Service: Cardiovascular;  Laterality: N/A;  . TONSILLECTOMY    . TRANSCATHETER AORTIC VALVE REPLACEMENT, TRANSFEMORAL N/A 03/18/2018   Procedure: TRANSCATHETER AORTIC VALVE REPLACEMENT, TRANSFEMORAL;  Surgeon: Sherren Mocha, MD;  Location: [facility];  Service: Open Heart Surgery;  Laterality: N/A;  . WISDOM TOOTH EXTRACTION      Current Medications: Current Meds  Medication Sig  . acetaminophen (TYLENOL) 650 MG CR tablet Take 650-1,300 mg by mouth every 8 (eight) hours as needed for pain.  Marland Kitchen amLODipine (NORVASC) 5 MG tablet Take 1 tablet (5 mg total) by mouth daily.  Marland Kitchen aspirin EC 81 MG tablet Take 81 mg by mouth daily.   . clopidogrel (PLAVIX) 75 MG tablet Take 1 tablet (75 mg total) by mouth daily.  Marland Kitchen estradiol  (ESTRACE) 1 MG tablet Take 0.5 mg by mouth daily.  . furosemide (LASIX) 20 MG tablet Take 1 tablet (20 mg total) by mouth every other day. Take an extra tablet on Monday, Wednesday, and Friday.  . labetalol (NORMODYNE) 100 MG tablet Take 100 mg by mouth 2 (two) times daily.   . Liniments (BLUE-EMU SUPER STRENGTH EX) Apply 1 application topically 3 (three) times daily as needed (for neck pain.).  Marland Kitchen Omega-3 Fatty Acids (FISH OIL) 1000 MG CAPS Take 1,000 mg by mouth daily.  Vladimir Faster Glycol-Propyl Glycol (SYSTANE OP) Place 1 drop into both eyes daily as needed (for dry eyes).  . pravastatin (PRAVACHOL) 20 MG tablet Take 20 mg by mouth at bedtime.   . Probiotic Product (CVS PROBIOTIC) CHEW Chew 2 each by mouth daily after supper.   . ranitidine (ZANTAC) 300 MG tablet Take 300 mg  by mouth at bedtime.   . vitamin B-12 (CYANOCOBALAMIN) 100 MCG tablet Take 100 mcg by mouth daily.  . [DISCONTINUED] furosemide (LASIX) 20 MG tablet Take 1 tablet (20 mg total) by mouth every other day.  . [DISCONTINUED] furosemide (LASIX) 20 MG tablet Take 1 tablet (20 mg total) by mouth every other day. Take an extra tablet on Monday, Wednesday, and Friday.  . [DISCONTINUED] furosemide (LASIX) 20 MG tablet Take 1 tablet (20 mg total) by mouth every other day. Take an extra tablet on Monday, Wednesday, and Friday.     Allergies:   Levofloxacin; Azithromycin; Clarithromycin; and Latex   Social History   Socioeconomic History  . Marital status: Married    Spouse name: Not on file  . Number of children: Not on file  . Years of education: Not on file  . Highest education level: Not on file  Occupational History  . Not on file  Social Needs  . Financial resource strain: Not on file  . Food insecurity:    Worry: Not on file    Inability: Not on file  . Transportation needs:    Medical: Not on file    Non-medical: Not on file  Tobacco Use  . Smoking status: Never Smoker  . Smokeless tobacco: Never Used    Substance and Sexual Activity  . Alcohol use: No  . Drug use: No  . Sexual activity: Not on file  Lifestyle  . Physical activity:    Days per week: Not on file    Minutes per session: Not on file  . Stress: Not on file  Relationships  . Social connections:    Talks on phone: Not on file    Gets together: Not on file    Attends religious service: Not on file    Active member of club or organization: Not on file    Attends meetings of clubs or organizations: Not on file    Relationship status: Not on file  Other Topics Concern  . Not on file  Social History Narrative  . Not on file     Family History: The patient's family history includes Congenital heart disease in her brother; Heart attack in her brother; Heart disease in her mother; Uterine cancer in her sister. ROS:   Please see the history of present illness.    All other systems reviewed and are negative.  EKGs/Labs/Other Studies Reviewed:    The following studies were reviewed today: Last pacemaker download reviewed prior to visit normal device function  Recent Labs: 03/14/2018: ALT 15 03/19/2018: Magnesium 2.0 03/23/2018: B Natriuretic Peptide 433.4; Hemoglobin 9.6; Platelets 136 04/07/2018: BUN 27; Creatinine, Ser 1.33; Potassium 5.1; Sodium 142  Recent Lipid Panel No results found for: CHOL, TRIG, HDL, CHOLHDL, VLDL, LDLCALC, LDLDIRECT  Physical Exam:    VS:  BP (!) 152/60 (BP Location: Right Arm, Patient Position: Sitting, Cuff Size: Small)   Pulse 76   Ht 5' (1.524 m)   Wt 120 lb 12.8 oz (54.8 kg)   SpO2 95%   BMI 23.59 kg/m     Wt Readings from Last 3 Encounters:  09/02/18 120 lb 12.8 oz (54.8 kg)  06/20/18 119 lb (54 kg)  04/17/18 121 lb (54.9 kg)     GEN:  Well nourished, well developed in no acute distress HEENT: Normal NECK: No JVD; No carotid bruits LYMPHATICS: No lymphadenopathy CARDIAC: RRR, no murmurs, rubs, gallops RESPIRATORY:  Clear to auscultation without rales, wheezing or rhonchi   ABDOMEN: Soft, non-tender,  non-distended MUSCULOSKELETAL: 2+ bilateral lower extremity edema; No deformity  SKIN: Warm and dry NEUROLOGIC:  Alert and oriented x 3 PSYCHIATRIC:  Normal affect    Signed, Shirlee More, MD  09/02/2018 12:21 PM    [facility]

## 2018-09-02 ENCOUNTER — Encounter: Payer: Self-pay | Admitting: Cardiology

## 2018-09-02 ENCOUNTER — Ambulatory Visit (INDEPENDENT_AMBULATORY_CARE_PROVIDER_SITE_OTHER): Payer: Medicare Other | Admitting: Cardiology

## 2018-09-02 VITALS — BP 152/60 | HR 76 | Ht 60.0 in | Wt 120.8 lb

## 2018-09-02 DIAGNOSIS — N183 Chronic kidney disease, stage 3 unspecified: Secondary | ICD-10-CM

## 2018-09-02 DIAGNOSIS — Z952 Presence of prosthetic heart valve: Secondary | ICD-10-CM | POA: Diagnosis not present

## 2018-09-02 DIAGNOSIS — I5033 Acute on chronic diastolic (congestive) heart failure: Secondary | ICD-10-CM

## 2018-09-02 DIAGNOSIS — I10 Essential (primary) hypertension: Secondary | ICD-10-CM

## 2018-09-02 DIAGNOSIS — Z950 Presence of cardiac pacemaker: Secondary | ICD-10-CM | POA: Insufficient documentation

## 2018-09-02 HISTORY — DX: Presence of cardiac pacemaker: Z95.0

## 2018-09-02 MED ORDER — FUROSEMIDE 20 MG PO TABS: 20.0000 mg | ORAL_TABLET | ORAL | 0 refills | Status: DC

## 2018-09-02 NOTE — Patient Instructions (Signed)
Medication Instructions:  Your physician has recommended you make the following change in your medication:  INCREASE furosemide (lasix) 20 mg: Take 1 tablet every other day. Take 1 extra tablet on Monday, Wednesday, and Friday.   If you need a refill on your cardiac medications before your next appointment, please call your pharmacy.   Lab work: Your physician recommends that you return for lab work today: BMP, ProBNP.  If you have labs (blood work) drawn today and your tests are completely normal, you will receive your results only by: Marland Kitchen MyChart Message (if you have MyChart) OR . A paper copy in the mail If you have any lab test that is abnormal or we need to change your treatment, we will call you to review the results.  Testing/Procedures: None  Follow-Up: At [facility], you and your health needs are our priority.  As part of our continuing mission to provide you with exceptional heart care, we have created designated Provider Care Teams.  These Care Teams include your primary Cardiologist (physician) and Advanced Practice Providers (APPs -  Physician Assistants and Nurse Practitioners) who all work together to provide you with the care you need, when you need it. You will need a follow up appointment in 3 months.  Please call our office 2 months in advance to schedule this appointment.

## 2018-09-03 ENCOUNTER — Telehealth: Payer: Self-pay

## 2018-09-03 LAB — BASIC METABOLIC PANEL
BUN / CREAT RATIO: 19 (ref 12–28)
BUN: 23 mg/dL (ref 10–36)
CHLORIDE: 104 mmol/L (ref 96–106)
CO2: 19 mmol/L — ABNORMAL LOW (ref 20–29)
Calcium: 9.2 mg/dL (ref 8.7–10.3)
Creatinine, Ser: 1.18 mg/dL — ABNORMAL HIGH (ref 0.57–1.00)
GFR, EST AFRICAN AMERICAN: 47 mL/min/{1.73_m2} — AB (ref >59)
GFR, EST NON AFRICAN AMERICAN: 41 mL/min/{1.73_m2} — AB (ref >59)
GLUCOSE: 83 mg/dL (ref 65–99)
POTASSIUM: 4.8 mmol/L (ref 3.5–5.2)
Sodium: 140 mmol/L (ref 134–144)

## 2018-09-03 LAB — SPECIMEN STATUS REPORT

## 2018-09-03 LAB — PRO B NATRIURETIC PEPTIDE: NT-Pro BNP: 1137 pg/mL — ABNORMAL HIGH (ref 0–738)

## 2018-09-03 NOTE — Telephone Encounter (Signed)
Left message for patient to return call.

## 2018-09-04 NOTE — Telephone Encounter (Signed)
Left message on patients machine with stable lab results per Dr Bettina Gavia.  Advised patient to return call to our office with any questions or concerns.

## 2018-09-19 ENCOUNTER — Other Ambulatory Visit: Payer: Self-pay | Admitting: Physician Assistant

## 2018-09-22 ENCOUNTER — Ambulatory Visit (INDEPENDENT_AMBULATORY_CARE_PROVIDER_SITE_OTHER): Payer: Medicare Other

## 2018-09-22 DIAGNOSIS — I442 Atrioventricular block, complete: Secondary | ICD-10-CM

## 2018-09-22 NOTE — Progress Notes (Signed)
Remote pacemaker transmission.   

## 2018-09-24 LAB — CUP PACEART REMOTE DEVICE CHECK
Battery Remaining Longevity: 128 mo
Battery Remaining Percentage: 95.5 %
Battery Voltage: 3.02 V
Brady Statistic AP VP Percent: 1 %
Brady Statistic AP VS Percent: 2.3 %
Brady Statistic AS VS Percent: 98 %
Brady Statistic RA Percent Paced: 2.2 %
Brady Statistic RV Percent Paced: 1 %
Date Time Interrogation Session: 20191223073545
Implantable Lead Implant Date: 20190620
Implantable Lead Implant Date: 20190620
Implantable Lead Location: 753859
Implantable Pulse Generator Implant Date: 20190620
Lead Channel Impedance Value: 400 Ohm
Lead Channel Impedance Value: 530 Ohm
Lead Channel Pacing Threshold Amplitude: 0.75 V
Lead Channel Pacing Threshold Amplitude: 0.875 V
Lead Channel Pacing Threshold Pulse Width: 0.5 ms
Lead Channel Sensing Intrinsic Amplitude: 5 mV
Lead Channel Sensing Intrinsic Amplitude: 7.9 mV
Lead Channel Setting Pacing Amplitude: 2 V
Lead Channel Setting Sensing Sensitivity: 4 mV
MDC IDC LEAD LOCATION: 753860
MDC IDC MSMT LEADCHNL RV PACING THRESHOLD PULSEWIDTH: 0.5 ms
MDC IDC SET LEADCHNL RV PACING AMPLITUDE: 1.125
MDC IDC SET LEADCHNL RV PACING PULSEWIDTH: 0.5 ms
MDC IDC STAT BRADY AS VP PERCENT: 1 %
Pulse Gen Model: 2272
Pulse Gen Serial Number: 9035930

## 2018-11-26 DIAGNOSIS — H532 Diplopia: Secondary | ICD-10-CM | POA: Diagnosis not present

## 2018-12-10 DIAGNOSIS — M47812 Spondylosis without myelopathy or radiculopathy, cervical region: Secondary | ICD-10-CM | POA: Diagnosis not present

## 2018-12-10 DIAGNOSIS — M545 Low back pain: Secondary | ICD-10-CM | POA: Diagnosis not present

## 2018-12-12 DIAGNOSIS — Z1231 Encounter for screening mammogram for malignant neoplasm of breast: Secondary | ICD-10-CM | POA: Diagnosis not present

## 2018-12-22 ENCOUNTER — Ambulatory Visit (INDEPENDENT_AMBULATORY_CARE_PROVIDER_SITE_OTHER): Payer: Medicare Other | Admitting: *Deleted

## 2018-12-22 ENCOUNTER — Other Ambulatory Visit: Payer: Self-pay

## 2018-12-22 DIAGNOSIS — I442 Atrioventricular block, complete: Secondary | ICD-10-CM

## 2018-12-23 LAB — CUP PACEART REMOTE DEVICE CHECK
Battery Remaining Longevity: 126 mo
Battery Remaining Percentage: 95.5 %
Battery Voltage: 3.02 V
Brady Statistic AP VP Percent: 1 %
Brady Statistic AP VS Percent: 2.3 %
Brady Statistic AS VP Percent: 1 %
Brady Statistic AS VS Percent: 98 %
Brady Statistic RA Percent Paced: 2.2 %
Date Time Interrogation Session: 20200323060033
Implantable Lead Implant Date: 20190620
Implantable Lead Implant Date: 20190620
Implantable Lead Location: 753859
Implantable Lead Location: 753860
Implantable Pulse Generator Implant Date: 20190620
Lead Channel Impedance Value: 460 Ohm
Lead Channel Pacing Threshold Amplitude: 0.875 V
Lead Channel Pacing Threshold Pulse Width: 0.5 ms
Lead Channel Pacing Threshold Pulse Width: 0.5 ms
Lead Channel Sensing Intrinsic Amplitude: 6.8 mV
Lead Channel Setting Pacing Amplitude: 1.125
Lead Channel Setting Pacing Amplitude: 2 V
Lead Channel Setting Pacing Pulse Width: 0.5 ms
Lead Channel Setting Sensing Sensitivity: 4 mV
MDC IDC MSMT LEADCHNL RA IMPEDANCE VALUE: 390 Ohm
MDC IDC MSMT LEADCHNL RA PACING THRESHOLD AMPLITUDE: 0.75 V
MDC IDC MSMT LEADCHNL RA SENSING INTR AMPL: 5 mV
MDC IDC STAT BRADY RV PERCENT PACED: 1 %
Pulse Gen Model: 2272
Pulse Gen Serial Number: 9035930

## 2018-12-24 ENCOUNTER — Ambulatory Visit: Payer: Medicare Other | Admitting: Cardiology

## 2018-12-30 NOTE — Progress Notes (Signed)
Remote pacemaker transmission.   

## 2019-01-20 ENCOUNTER — Telehealth: Payer: Self-pay | Admitting: Physician Assistant

## 2019-01-20 NOTE — Telephone Encounter (Signed)
Patient set up for MyChart? no  Is patient using Smartphone/computer/tablet? no  Did audio/video work? Patient only has landline  Does patient need telephone visit? telephone  Best phone number to use? [phone]  Special Instructions? Patient wants to have telephone visit- doesn't have access to computer or smartphone , Echo set up for August when office reopens      Virtual Visit Pre-Appointment Phone Call  Steps For Call:  1. Confirm consent - "In the setting of the current Covid19 crisis, you are scheduled for a (phone or video) visit with your provider on (date) at (time).  Just as we do with many in-office visits, in order for you to participate in this visit, we must obtain consent.  If you'd like, I can send this to your mychart (if signed up) or email for you to review.  Otherwise, I can obtain your verbal consent now.  All virtual visits are billed to your insurance company just like a normal visit would be.  By agreeing to a virtual visit, we'd like you to understand that the technology does not allow for your provider to perform an examination, and thus may limit your provider's ability to fully assess your condition. If your provider identifies any concerns that need to be evaluated in person, we will make arrangements to do so.  Finally, though the technology is pretty good, we cannot assure that it will always work on either your or our end, and in the setting of a video visit, we may have to convert it to a phone-only visit.  In either situation, we cannot ensure that we have a secure connection.  Are you willing to proceed?" STAFF: Did the patient verbally acknowledge consent to telehealth visit? Document YES/NO here:  Yes   2. Confirm the BEST phone number to call the day of the visit by including in appointment notes  3. Give patient instructions for MyChart download to smartphone OR Doximity/[email or web address].me as below if video visit (depending on what platform provider is  using)  4. Confirm that appointment type is correct in Epic appointment notes (VIDEO vs PHONE)  5. Advise patient to be prepared with their blood pressure, heart rate, weight, any heart rhythm information, their current medicines, and a piece of paper and pen handy for any instructions they may receive the day of their visit  6. Inform patient they will receive a phone call 15 minutes prior to their appointment time (may be from unknown caller ID) so they should be prepared to answer    [place] has been deemed a candidate for a follow-up tele-health visit to limit community exposure during the Covid-19 pandemic. I spoke with the patient via phone to ensure availability of phone/video source, confirm preferred email & phone number, and discuss instructions and expectations.  I reminded [name] to be prepared with any vital sign and/or heart rhythm information that could potentially be obtained via home monitoring, at the time of her visit. I reminded MAELYS KINNICK to expect a phone call prior to her visit.  Howie Ill 01/20/2019 10:37 AM   INSTRUCTIONS FOR DOWNLOADING THE MYCHART APP TO SMARTPHONE  - The patient must first make sure to have activated MyChart and know their login information - If Apple, go to [employer] and type in MyChart in the search bar and download the app. If Android, ask patient to go to [employer] and type in [place] in the search bar and  download the app. The app is free but as with any other app downloads, their phone may require them to verify saved payment information or Apple/Android password.  - The patient will need to then log into the app with their MyChart username and password, and select [facility] as their healthcare provider to link the account. When it is time for your visit, go to the MyChart app, find appointments, and click Begin Video Visit. Be sure to Select Allow for your device to access the Microphone and  Camera for your visit. You will then be connected, and your provider will be with you shortly.  **If they have any issues connecting, or need assistance please contact MyChart service desk [phone])**  **If using a computer, in order to ensure the best quality for their visit they will need to use either of the following Internet Browsers: [employer], or Google Chrome**  IF USING DOXIMITY or DOXY.ME - The patient will receive a link just prior to their visit by text.     FULL LENGTH CONSENT FOR TELE-HEALTH VISIT   I hereby voluntarily request, consent and authorize [facility] and its employed or contracted physicians, physician assistants, nurse practitioners or other licensed health care professionals (the Practitioner), to provide me with telemedicine health care services (the Services") as deemed necessary by the treating Practitioner. I acknowledge and consent to receive the Services by the Practitioner via telemedicine. I understand that the telemedicine visit will involve communicating with the Practitioner through live audiovisual communication technology and the disclosure of certain medical information by electronic transmission. I acknowledge that I have been given the opportunity to request an in-person assessment or other available alternative prior to the telemedicine visit and am voluntarily participating in the telemedicine visit.  I understand that I have the right to withhold or withdraw my consent to the use of telemedicine in the course of my care at any time, without affecting my right to future care or treatment, and that the Practitioner or I may terminate the telemedicine visit at any time. I understand that I have the right to inspect all information obtained and/or recorded in the course of the telemedicine visit and may receive copies of available information for a reasonable fee.  I understand that some of the potential risks of receiving the  Services via telemedicine include:   Delay or interruption in medical evaluation due to technological equipment failure or disruption;  Information transmitted may not be sufficient (e.g. poor resolution of images) to allow for appropriate medical decision making by the Practitioner; and/or   In rare instances, security protocols could fail, causing a breach of personal health information.  Furthermore, I acknowledge that it is my responsibility to provide information about my medical history, conditions and care that is complete and accurate to the best of my ability. I acknowledge that Practitioner's advice, recommendations, and/or decision may be based on factors not within their control, such as incomplete or inaccurate data provided by me or distortions of diagnostic images or specimens that may result from electronic transmissions. I understand that the practice of medicine is not an exact science and that Practitioner makes no warranties or guarantees regarding treatment outcomes. I acknowledge that I will receive a copy of this consent concurrently upon execution via email to the email address I last provided but may also request a printed copy by calling the office of [facility].    I understand that my insurance will be billed for this visit.  I have read or had this consent read to me.  I understand the contents of this consent, which adequately explains the benefits and risks of the Services being provided via telemedicine.   I have been provided ample opportunity to ask questions regarding this consent and the Services and have had my questions answered to my satisfaction.  I give my informed consent for the services to be provided through the use of telemedicine in my medical care  By participating in this telemedicine visit I agree to the above.

## 2019-01-29 ENCOUNTER — Other Ambulatory Visit: Payer: Self-pay

## 2019-01-29 ENCOUNTER — Encounter: Payer: Self-pay | Admitting: Physician Assistant

## 2019-01-29 ENCOUNTER — Telehealth (INDEPENDENT_AMBULATORY_CARE_PROVIDER_SITE_OTHER): Payer: Medicare Other | Admitting: Physician Assistant

## 2019-01-29 VITALS — BP 134/54 | HR 77 | Temp 96.8°F | Wt 115.0 lb

## 2019-01-29 DIAGNOSIS — Z952 Presence of prosthetic heart valve: Secondary | ICD-10-CM

## 2019-01-29 DIAGNOSIS — I5032 Chronic diastolic (congestive) heart failure: Secondary | ICD-10-CM

## 2019-01-29 DIAGNOSIS — Z950 Presence of cardiac pacemaker: Secondary | ICD-10-CM

## 2019-01-29 DIAGNOSIS — I10 Essential (primary) hypertension: Secondary | ICD-10-CM

## 2019-01-29 DIAGNOSIS — Z7189 Other specified counseling: Secondary | ICD-10-CM

## 2019-01-29 MED ORDER — AMOXICILLIN 500 MG PO TABS: 2000.0000 mg | ORAL_TABLET | ORAL | 12 refills | Status: DC

## 2019-01-29 NOTE — Progress Notes (Signed)
[facility]ART [facility]ASC[facility]ENTER   MULTIDISCIPLINARY HEART VALVE TEAM     Virtual Visit via Telephone Note   This visit type was conducted due to national recommendations for restrictions regarding the COVID-19 Pandemic (e.g. social distancing) in an effort to limit this patient's exposure and mitigate transmission in our community.  Due to her co-morbid illnesses, this patient is at least at moderate risk for complications without adequate follow up.  This format is felt to be most appropriate for this patient at this time.  The patient did not have access to video technology/had technical difficulties with video requiring transitioning to audio format only (telephone).  All issues noted in this document were discussed and addressed.  No physical exam could be performed with this format.  Please refer to the patient's chart for her  consent to telehealth for [facility].   Evaluation Performed:  Follow-up visit  Date:  01/29/2019   ID:  Kaitlin Villarreal, DOB 26-Nov-1925, MRN [id number]  Patient Location: Home Provider Location: Office  PCP:  Angelina Sheriff, MD  Cardiologist:  Shirlee More, MD / Dr. Burt Knack & Dr. Roxy Manns (TAVR) Electrophysiologist:  Will Meredith Leeds, MD   Chief Complaint:  1 year s/p TAVR   History of Present Illness:    Kaitlin Villarreal is a 85 y.o. female with HTN, HLD, CKD and severe ASs/p TAVR (03/18/18) who presents for follow up.   The patient does not have symptoms concerning for COVID-19 infection (fever, chills, cough, or new shortness of breath).   She underwentsuccessful TAVR with a16mm Edwards Sapien 3 THV via the TF approach on 03/18/18.Surgery was c/b a small VSD that remained stable by serial echocardiograms. She also developed CHBrequiring pacemaker placement. POD1 echo showed EF 65% and a normally functioning TAVR valve with no PVL and mean gradient not reported. [place] 03/20/18 did not show any significant L-->R shuntingof the VSD. She was discharged on ASA  alone. Follow up limited echo 03/17/18 showed stable VSD. Plavix was added. At her follow up she complained of right groin pain. LE doppler showed no pseudoaneurysm.   She was seen by Dr. Bettina Gavia in 08/2018 for follow up. She was found to have worsening LE edema. NT- pro BNP was elevated and Lasix was increased.   Today she presents for a telehealth visit. She tells me that she hasn't felt as good since her lasix was increased. Her LE edema has no resolved. However, she has begun to notice worsening dyspnea. She will be working out in her garden and feel short of breath and have to rest. She denies orthopnea or PND. No dizziness or syncope. She is mostly limited by severe pain in her back.    Past Medical History:  Diagnosis Date   Acute on chronic diastolic heart failure ([place]) 03/18/2018   Arthritis    Bone spur of toe of left foot    Chronic diastolic (congestive) heart failure (HCC)    Essential hypertension    GERD (gastroesophageal reflux disease)    HLD (hyperlipidemia)    Hyperlipidemia 11/25/2016   Pancreatic mass    a. 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 03/2018: s/p TAVR by Burt Knack and Dr. Roxy Manns   Stage 3  chronic kidney disease ([place])    TIA (transient ischemic attack)     a. 1992   VSD (ventricular septal defect)    Past Surgical History:  Procedure Laterality Date   ABDOMINAL HYSTERECTOMY     APPENDECTOMY     HERNIA REPAIR     INTRAOPERATIVE TRANSTHORACIC ECHOCARDIOGRAM N/A 03/18/2018   Procedure: INTRAOPERATIVE TRANSTHORACIC ECHOCARDIOGRAM;  Surgeon: Sherren Mocha, MD;  Location: [facility];  Service: Open Heart Surgery;  Laterality: N/A;   PACEMAKER IMPLANT N/A 03/20/2018   Procedure: PACEMAKER IMPLANT;   Surgeon: Constance Haw, MD;  Location: [facility];  Service: Cardiovascular;  Laterality: N/A;   RIGHT HEART CATH N/A 03/20/2018   Procedure: RIGHT HEART CATH;  Surgeon: Sherren Mocha, MD;  Location: [facility];  Service: Cardiovascular;  Laterality: N/A;   RIGHT/LEFT HEART CATH AND CORONARY ANGIOGRAPHY N/A 02/06/2018   Procedure: RIGHT/LEFT HEART CATH AND CORONARY ANGIOGRAPHY;  Surgeon: Sherren Mocha, MD;  Location: [facility];  Service: Cardiovascular;  Laterality: N/A;   TONSILLECTOMY     TRANSCATHETER AORTIC VALVE REPLACEMENT, TRANSFEMORAL N/A 03/18/2018   Procedure: TRANSCATHETER AORTIC VALVE REPLACEMENT, TRANSFEMORAL;  Surgeon: Sherren Mocha, MD;  Location: [facility];  Service: Open Heart Surgery;  Laterality: N/A;   WISDOM TOOTH EXTRACTION       Current Meds  Medication Sig   acetaminophen (TYLENOL) 650 MG CR tablet Take 650-1,300 mg by mouth every 8 (eight) hours as needed for pain.   amLODipine (NORVASC) 5 MG tablet Take 1 tablet (5 mg total) by mouth daily.   aspirin EC 81 MG tablet Take 81 mg by mouth daily.    estradiol (ESTRACE) 1 MG tablet Take 0.5 mg by mouth daily.   furosemide (LASIX) 20 MG tablet Take 1 tablet (20 mg total) by mouth every other day. Take an extra tablet on Monday, Wednesday, and Friday.   labetalol (NORMODYNE) 100 MG tablet Take 100 mg by mouth 2 (two) times daily.    Liniments (BLUE-EMU SUPER STRENGTH EX) Apply 1 application topically 3 (three) times daily as needed (for neck pain.).   Omega-3 Fatty Acids (FISH OIL) 1000 MG CAPS Take 1,000 mg by mouth daily.   Polyethyl Glycol-Propyl Glycol (SYSTANE OP) Place 1 drop into both eyes daily as needed (for dry eyes).   pravastatin (PRAVACHOL) 20 MG tablet Take 20 mg by mouth at bedtime.    Probiotic Product ([employer] PROBIOTIC) CHEW Chew 2 each by mouth daily after supper.    ranitidine (ZANTAC) 300 MG tablet Take 300 mg by mouth at bedtime.    vitamin B-12  (CYANOCOBALAMIN) 100 MCG tablet Take 100 mcg by mouth daily.   Vitamin D, Ergocalciferol, (DRISDOL) 1.25 MG (50000 UT) CAPS capsule TAKE 1 CAPSULE BY MOUTH 2 TIMES A WEEK   [DISCONTINUED] clopidogrel (PLAVIX) 75 MG tablet TAKE 1 TABLET BY MOUTH EVERY DAY     Allergies:   Levofloxacin; Azithromycin; Clarithromycin; and Latex   Social History   Tobacco Use   Smoking status: Never Smoker   Smokeless tobacco: Never Used  Substance Use Topics   Alcohol use: No   Drug use: No     Family Hx: The patient's family history includes Congenital heart disease in her brother; Heart attack in her brother; Heart disease in her mother; Uterine cancer in her sister.  ROS:   Please see the history of present illness.    All other systems reviewed and are negative.   Prior CV studies:   The following studies  were reviewed today:  TAVR OPERATIVE NOTE   Date of Procedure:03/18/2018  Preoperative Diagnosis:Severe Aortic Stenosis   Procedure:   Transcatheter Aortic Valve Replacement - PercutaneousRightTransfemoral Approach Edwards Sapien 3 THV (size 71mm, model # 9600TFX, serial # [id number])  Co-Surgeons:Clarence H. Roxy Manns, MD and Sherren Mocha, MD  Pre-operative Echo Findings: ? Severe aortic stenosis ? Normalleft ventricular systolic function  Post-operative Echo Findings: ? Noparavalvular leak ? Very small perimembranous ventricular septal defect ? No pericardial effusion ? Normalleft ventricular systolic function  _______________  Limited echo 03/18/18 5 hours later a limited TTE was performed. It showed unchanged small VSD, stable aortic valve with normal unchanged gradients. No paravalvular leak. Trivial pericardial effusion  _______________  Post operative echo 03/19/18 Study Conclusions - Left ventricle: The cavity size was normal. Wall thickness was increased in a pattern of  severe LVH. There was focal basal hypertrophy. Systolic function was vigorous. The estimated ejection fraction was in the range of 65% to 70%. Wall motion was normal; there were no regional wall motion abnormalities. Doppler parameters are consistent with abnormal left ventricular relaxation (grade 1 diastolic dysfunction). - Aortic valve: A bioprosthesis was present. - Mitral valve: Mildly calcified annulus. Moderately thickened, moderately calcified leaflets . There was mild regurgitation. - Pulmonary arteries: Systolic pressure was moderately increased. PA peak pressure: 53 mm Hg (S).  _______________  03/21/18 RIGHT HEART CATH  Conclusion   1. No significant left to right shunting based on absence of a significant O2 step up from the SVC to the pulmonary artery 2. Elevated right and left heart intracardiac filling pressures consistent with acute on chronic diastolic heart failure  Plan: Serial echo follow-up of the patient's small perimembranous VSD, IV diuresis and medical therapy for treatment of congestive heart failure, permanent pacemaker placement per Dr. Curt Bears to follow    _______________   03/20/18 SURGEON: Allegra Lai, MD   PREPROCEDURE DIAGNOSIS:Complete AV block  POSTPROCEDURE DIAGNOSIS:Complete AV block  PROCEDURES:  1. Pacemaker implantation. 2. Left upper extremity venography.    _______________  Limited echo 03/27/18 Study Conclusions - Left ventricle: Small perimembranous VSD with L to R flow. The cavity size was normal. Wall thickness was normal. Systolic function was vigorous. The estimated ejection fraction was in the range of 65% to 70%. - Mitral valve: Calcified annulus. Mildly thickened leaflets .  _______________  2D ECHO 04/17/18 (1 month s/p TAVR) Study Conclusions - Left ventricle: The cavity size was normal. Wall thickness was increased in a pattern of moderate LVH. Systolic function  was normal. The estimated ejection fraction was in the range of 60% to 65%. Wall motion was normal; there were no regional wall motion abnormalities. Features are consistent with a pseudonormal left ventricular filling pattern, with concomitant abnormal relaxation and increased filling pressure (grade 2 diastolic dysfunction). - Aortic valve: A bioprosthesis was present. There was trivial regurgitation. Valve area (VTI): 2.93 cm^2. Valve area (Vmax): 2.14 cm^2. Valve area (Vmean): 2.5 cm^2. - Mitral valve: There was mild regurgitation. - Left atrium: The atrium was moderately dilated. - Pulmonary arteries: Systolic pressure was moderately to severely increased. PA peak pressure: 66 mm Hg (S).    Labs/Other Tests and Data Reviewed:    EKG:  No ECG reviewed.  Recent Labs: 03/14/2018: ALT 15 03/19/2018: Magnesium 2.0 03/23/2018: B Natriuretic Peptide 433.4; Hemoglobin 9.6; Platelets 136 09/02/2018: BUN 23; Creatinine, Ser 1.18; NT-Pro BNP 1,137; Potassium 4.8; Sodium 140   Recent Lipid Panel No results found for: CHOL, TRIG, HDL, CHOLHDL, LDLCALC, LDLDIRECT  Wt  Readings from Last 3 Encounters:  01/29/19 115 lb (52.2 kg)  09/02/18 120 lb 12.8 oz (54.8 kg)  06/20/18 119 lb (54 kg)     Objective:    Vital Signs:  BP (!) 134/54 (Patient Position: Standing)    Pulse 77    Temp (!) 96.8 F (36 C)    Wt 115 lb (52.2 kg)    BMI 22.46 kg/m     ASSESSMENT & PLAN:    Severe AS s/p TAVR: doing okay. Has NHYA class II-III symptoms. Shortness of breath has worsened over the past few months. Her diuretic was increased by Dr. Bettina Gavia in Dec and her LE edema has improved. Given worsening dyspnea, I have arranged for her echo to be completed next week in [place]. K[facility]Q completed over the phone. She has amoxicillin for SBE prophylaxis.   CHB s/p JJH:ERDEYCXK by Dr. Curt Bears  Chronic diastolic CHF:has had some worsening shortness of breath. Her diuretics were increased in  dec 2019 given worsening LE edema and elevated BNP. She thinks the increased lasix has made her feel worse. Plan to keep lasix dosing the same for now. Echo next week.   HTN:BP has been well controlled.   Pancreatic mass: pre TAVR scan showed a likely benign mass on the tail of the pancreas. Repeat pancreatic protocol CT scan or abdominal MRI with and without IV gadolinium with MRCP is recommended in 2 years to ensure continued stability. Due 01/2020. Discussed with patient. I will fax to her PCP. Given age, he may not recommend further work up.   COVID-19 Education: The signs and symptoms of COVID-19 were discussed with the patient and how to seek care for testing (follow up with PCP or arrange E-visit).  The importance of social distancing was discussed today.  Time:   Today, I have spent 30 minutes with the patient with telehealth technology discussing the above problems.     Medication Adjustments/Labs and Tests Ordered: Current medicines are reviewed at length with the patient today.  Concerns regarding medicines are outlined above.   Tests Ordered: No orders of the defined types were placed in this encounter.   Medication Changes: Meds ordered this encounter  Medications   amoxicillin (AMOXIL) 500 MG tablet    Sig: Take 4 tablets (2,000 mg total) by mouth as directed. 1 hour prior to dental work including cleanings    Dispense:  12 tablet    Refill:  12    Order Specific Question:   Supervising Provider    Answer:   Burt Knack, [facility]    Disposition:  Follow up in 1 week(s)  For echo.  Signed, Angelena Form, PA-C  01/29/2019 4:10 PM    [facility]

## 2019-01-29 NOTE — Patient Instructions (Signed)
Hello Miss Simoneaux,   It was so nice to talk to you on the phone today. I am sorry your breathing has gotten worse. I just wanted to send you a recap of our discussion.   Please continue taking antibiotics prior to any dental work including cleanings (amoxicillin). Please continue on all your same medications.   Your 1 year echo has been rescheduled to next week. Please keep your appointment with Dr. Bettina Gavia on 5/28.   All appointment details are attached to this letter in your "after visit summary."  Please call us with any questions or concerns you may have and please stay safe during these uncertain times.  Nell Range

## 2019-01-29 NOTE — Progress Notes (Signed)
Thank you Dr. Bettina Gavia!

## 2019-01-29 NOTE — Progress Notes (Signed)
I will plan to see her in the office at the end of the week after the echocardiogram is done and send a note and follow-up.  Also send a note to Legrand Como today about a woman who has an atrial septal aneurysm and bidirectional shunt when she was at [facility] in March recurrent strokes and I feel just take a look at his epic chat

## 2019-02-04 ENCOUNTER — Ambulatory Visit (INDEPENDENT_AMBULATORY_CARE_PROVIDER_SITE_OTHER): Payer: Medicare Other

## 2019-02-04 ENCOUNTER — Other Ambulatory Visit: Payer: Self-pay

## 2019-02-04 ENCOUNTER — Other Ambulatory Visit: Payer: Self-pay | Admitting: Physician Assistant

## 2019-02-04 DIAGNOSIS — Z952 Presence of prosthetic heart valve: Secondary | ICD-10-CM | POA: Diagnosis not present

## 2019-02-04 DIAGNOSIS — Q210 Ventricular septal defect: Secondary | ICD-10-CM

## 2019-02-04 NOTE — Progress Notes (Signed)
Complete echocardiogram has been performed.  Jimmy Savaughn Karwowski RDCS, RVT 

## 2019-02-06 ENCOUNTER — Encounter: Payer: Self-pay | Admitting: Cardiology

## 2019-02-06 ENCOUNTER — Other Ambulatory Visit: Payer: Self-pay

## 2019-02-06 ENCOUNTER — Ambulatory Visit (INDEPENDENT_AMBULATORY_CARE_PROVIDER_SITE_OTHER): Payer: Medicare Other | Admitting: Cardiology

## 2019-02-06 VITALS — BP 142/52 | HR 64 | Wt 120.0 lb

## 2019-02-06 DIAGNOSIS — I10 Essential (primary) hypertension: Secondary | ICD-10-CM

## 2019-02-06 DIAGNOSIS — N183 Chronic kidney disease, stage 3 unspecified: Secondary | ICD-10-CM

## 2019-02-06 DIAGNOSIS — I5032 Chronic diastolic (congestive) heart failure: Secondary | ICD-10-CM | POA: Diagnosis not present

## 2019-02-06 DIAGNOSIS — E782 Mixed hyperlipidemia: Secondary | ICD-10-CM | POA: Diagnosis not present

## 2019-02-06 NOTE — Patient Instructions (Addendum)
Medication Instructions:  Your physician recommends that you continue on your current medications as directed. Please refer to the Current Medication list given to you today.  If you need a refill on your cardiac medications before your next appointment, please call your pharmacy.   Lab work: Your physician recommends that you return for lab work today: BMP, ProBNP, lipid panel.   If you have labs (blood work) drawn today and your tests are completely normal, you will receive your results only by: Marland Kitchen MyChart Message (if you have MyChart) OR . A paper copy in the mail If you have any lab test that is abnormal or we need to change your treatment, we will call you to review the results.  Testing/Procedures: None  Follow-Up: At [facility], you and your health needs are our priority.  As part of our continuing mission to provide you with exceptional heart care, we have created designated Provider Care Teams.  These Care Teams include your primary Cardiologist (physician) and Advanced Practice Providers (APPs -  Physician Assistants and Nurse Practitioners) who all work together to provide you with the care you need, when you need it. You will need a follow up appointment in 3 months: Friday, 05/08/2019, at 11:00 am in the [place] office.

## 2019-02-06 NOTE — Progress Notes (Signed)
Cardiology Office Note:    Date:  02/06/2019   ID:  Kaitlin Villarreal, DOB June 27, 1926, MRN [id number]  PCP:  Angelina Sheriff, MD  Cardiologist:  Shirlee More, MD    Referring MD: Angelina Sheriff, MD    ASSESSMENT:    1. Chronic diastolic heart failure ([place])   2. Essential hypertension   3. Mixed hyperlipidemia   4. Stage 3 chronic kidney disease ([place])   5. CKD (chronic kidney disease) stage 3, GFR 30-59 ml/min (HCC)    PLAN:    In order of problems listed above:  1. Heart failure chronic diastolic I think it is nicely compensated continue current diuretic sodium restriction check labs today for safety with CKD including a BMP and a proBNP level.  After seeing her in the office I do not think her VSD contributes to her heart failure.  She has normal TAVR valve function. 2. Hypertension stable continue treatment including calcium channel blocker and labetalol 3. Hyperlipidemia stable continue her statin check a lipid profile 4. Stage III CKD stable recheck renal function today   Next appointment: 3 months   Medication Adjustments/Labs and Tests Ordered: Current medicines are reviewed at length with the patient today.  Concerns regarding medicines are outlined above.  No orders of the defined types were placed in this encounter.  No orders of the defined types were placed in this encounter.   Chief Complaint  Patient presents with  . Follow-up    after TAVR    History of Present Illness:    Kaitlin Villarreal is a 85 y.o. female with a hx of TAVR complicated by heart block necessitating permanent pacemaker small traumatic VSD not hemodynamically significant and heart failure last seen in the last few months and being seen by me today at the request of the structural heart disease clinic.  Unfortunately Kaitlin Villarreal's predominant problem here is compression fracture and with pain she finds her self breathless.  With her current diuretic she has little to no edema no orthopnea is not  short of breath when she is not having severe back pain and is pending elective kyphoplasty.  Overall she is improved the quality of her life is good we will continue her current diuretic and with stage III CKD recheck labs today including BMP and a proBNP level.. Compliance with diet, lifestyle and medications: Yes Past Medical History:  Diagnosis Date  . Acute on chronic diastolic heart failure ([place]) 03/18/2018  . Arthritis   . Bone spur of toe of left foot   . Chronic diastolic (congestive) heart failure ([place])   . Essential hypertension   . GERD (gastroesophageal reflux disease)   . HLD (hyperlipidemia)   . Hyperlipidemia 11/25/2016  . Pancreatic mass    a. benign appearing but needs f/u, noted on pre TAVR CTs  . 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 Severe aortic stenosis    a. 03/2018: s/p TAVR by Burt Knack and Dr. Roxy Manns  . Stage 3 chronic kidney disease ([place])   . TIA (transient ischemic attack)     a. 1992  . VSD (ventricular septal defect)     Past Surgical History:  Procedure Laterality Date  . ABDOMINAL HYSTERECTOMY    . APPENDECTOMY    . HERNIA REPAIR    . INTRAOPERATIVE TRANSTHORACIC ECHOCARDIOGRAM N/A 03/18/2018  Procedure: INTRAOPERATIVE TRANSTHORACIC ECHOCARDIOGRAM;  Surgeon: Sherren Mocha, MD;  Location: [facility];  Service: Open Heart Surgery;  Laterality: N/A;  . PACEMAKER IMPLANT N/A 03/20/2018   Procedure: PACEMAKER IMPLANT;  Surgeon: Constance Haw, MD;  Location: [facility];  Service: Cardiovascular;  Laterality: N/A;  . RIGHT HEART CATH N/A 03/20/2018   Procedure: RIGHT HEART CATH;  Surgeon: Sherren Mocha, MD;  Location: [facility];  Service: Cardiovascular;  Laterality: N/A;  . RIGHT/LEFT HEART CATH AND CORONARY ANGIOGRAPHY N/A 02/06/2018   Procedure: RIGHT/LEFT HEART CATH AND  CORONARY ANGIOGRAPHY;  Surgeon: Sherren Mocha, MD;  Location: [facility];  Service: Cardiovascular;  Laterality: N/A;  . TONSILLECTOMY    . TRANSCATHETER AORTIC VALVE REPLACEMENT, TRANSFEMORAL N/A 03/18/2018   Procedure: TRANSCATHETER AORTIC VALVE REPLACEMENT, TRANSFEMORAL;  Surgeon: Sherren Mocha, MD;  Location: [facility];  Service: Open Heart Surgery;  Laterality: N/A;  . WISDOM TOOTH EXTRACTION      Current Medications: Current Meds  Medication Sig  . acetaminophen (TYLENOL) 650 MG CR tablet Take 650-1,300 mg by mouth every 8 (eight) hours as needed for pain.  Marland Kitchen amLODipine (NORVASC) 5 MG tablet Take 1 tablet (5 mg total) by mouth daily.  Marland Kitchen amoxicillin (AMOXIL) 500 MG tablet Take 4 tablets (2,000 mg total) by mouth as directed. 1 hour prior to dental work including cleanings  . aspirin EC 81 MG tablet Take 81 mg by mouth daily.   Marland Kitchen estradiol (ESTRACE) 1 MG tablet Take 0.5 mg by mouth daily.  . famotidine (PEPCID) 20 MG tablet Take 20 mg by mouth as needed for heartburn or indigestion.  . furosemide (LASIX) 20 MG tablet Take 20 mg by mouth as directed. Take 1 tablet alternating with 2 tablets, and 1 tablet on Saturday and Sunday  . labetalol (NORMODYNE) 100 MG tablet Take 100 mg by mouth 2 (two) times daily.   . Liniments (BLUE-EMU SUPER STRENGTH EX) Apply 1 application topically 3 (three) times daily as needed (for neck pain.).  Marland Kitchen Omega-3 Fatty Acids (FISH OIL) 1000 MG CAPS Take 1,000 mg by mouth daily.  Vladimir Faster Glycol-Propyl Glycol (SYSTANE OP) Place 1 drop into both eyes daily as needed (for dry eyes).  . pravastatin (PRAVACHOL) 20 MG tablet Take 20 mg by mouth at bedtime.   . Probiotic Product (CVS PROBIOTIC) CHEW Chew 2 each by mouth daily after supper.   . vitamin B-12 (CYANOCOBALAMIN) 100 MCG tablet Take 100 mcg by mouth daily.  . Vitamin D, Ergocalciferol, (DRISDOL) 1.25 MG (50000 UT) CAPS capsule TAKE 1 CAPSULE BY MOUTH 2 TIMES A WEEK     Allergies:   Levofloxacin;  Azithromycin; Clarithromycin; and Latex   Social History   Socioeconomic History  . Marital status: Married    Spouse name: Not on file  . Number of children: Not on file  . Years of education: Not on file  . Highest education level: Not on file  Occupational History  . Not on file  Social Needs  . Financial resource strain: Not on file  . Food insecurity:    Worry: Not on file    Inability: Not on file  . Transportation needs:    Medical: Not on file    Non-medical: Not on file  Tobacco Use  . Smoking status: Never Smoker  . Smokeless tobacco: Never Used  Substance and Sexual Activity  . Alcohol use: No  . Drug use: No  . Sexual activity: Not on file  Lifestyle  .  Physical activity:    Days per week: Not on file    Minutes per session: Not on file  . Stress: Not on file  Relationships  . Social connections:    Talks on phone: Not on file    Gets together: Not on file    Attends religious service: Not on file    Active member of club or organization: Not on file    Attends meetings of clubs or organizations: Not on file    Relationship status: Not on file  Other Topics Concern  . Not on file  Social History Narrative  . Not on file     Family History: The patient's family history includes Congenital heart disease in her brother; Heart attack in her brother; Heart disease in her mother; Uterine cancer in her sister. ROS:   Please see the history of present illness.    All other systems reviewed and are negative.  EKGs/Labs/Other Studies Reviewed:    The following studies were reviewed today:  Recent pacemaker device check reviewed she has less than 2% ventricular pacing and her heart failure is not due to RV pacing.  Recent echocardiogram reviewed with the patient she has a small VSD normal left ventricular ejection fraction and normal function of her bioprosthetic valve  Recent Labs: 03/14/2018: ALT 15 03/19/2018: Magnesium 2.0 03/23/2018: B Natriuretic  Peptide 433.4; Hemoglobin 9.6; Platelets 136 09/02/2018: BUN 23; Creatinine, Ser 1.18; NT-Pro BNP 1,137; Potassium 4.8; Sodium 140  Recent Lipid Panel No results found for: CHOL, TRIG, HDL, CHOLHDL, VLDL, LDLCALC, LDLDIRECT  Physical Exam:    VS:  BP (!) 142/52 (BP Location: Right Arm, Patient Position: Sitting, Cuff Size: Normal)   Pulse 64   Wt 120 lb (54.4 kg)   SpO2 96%   BMI 23.44 kg/m     Wt Readings from Last 3 Encounters:  02/06/19 120 lb (54.4 kg)  01/29/19 115 lb (52.2 kg)  09/02/18 120 lb 12.8 oz (54.8 kg)     GEN: She appears frail well nourished, well developed in no acute distress HEENT: Normal NECK: No JVD; No carotid bruits LYMPHATICS: No lymphadenopathy CARDIAC: RRR, no murmurs, rubs, gallops RESPIRATORY:  Clear to auscultation without rales, wheezing or rhonchi  ABDOMEN: Soft, non-tender, non-distended MUSCULOSKELETAL: Trace bilateral edema at the edge of her sock edema; No deformity  SKIN: Warm and dry NEUROLOGIC:  Alert and oriented x 3 PSYCHIATRIC:  Normal affect    Signed, Shirlee More, MD  02/06/2019 11:26 AM    [place]

## 2019-02-07 LAB — BASIC METABOLIC PANEL
BUN/Creatinine Ratio: 22 (ref 12–28)
BUN: 29 mg/dL (ref 10–36)
CO2: 24 mmol/L (ref 20–29)
Calcium: 9.8 mg/dL (ref 8.7–10.3)
Chloride: 102 mmol/L (ref 96–106)
Creatinine, Ser: 1.29 mg/dL — ABNORMAL HIGH (ref 0.57–1.00)
GFR calc Af Amer: 42 mL/min/{1.73_m2} — ABNORMAL LOW (ref >59)
GFR calc non Af Amer: 37 mL/min/{1.73_m2} — ABNORMAL LOW (ref >59)
Glucose: 87 mg/dL (ref 65–99)
Potassium: 5.3 mmol/L — ABNORMAL HIGH (ref 3.5–5.2)
Sodium: 142 mmol/L (ref 134–144)

## 2019-02-07 LAB — LIPID PANEL
Chol/HDL Ratio: 2.5 ratio (ref 0.0–4.4)
Cholesterol, Total: 229 mg/dL — ABNORMAL HIGH (ref 100–199)
HDL: 90 mg/dL (ref >39)
LDL Calculated: 106 mg/dL — ABNORMAL HIGH (ref 0–99)
Triglycerides: 164 mg/dL — ABNORMAL HIGH (ref 0–149)
VLDL Cholesterol Cal: 33 mg/dL (ref 5–40)

## 2019-02-07 LAB — PRO B NATRIURETIC PEPTIDE: NT-Pro BNP: 1002 pg/mL — ABNORMAL HIGH (ref 0–738)

## 2019-02-16 DIAGNOSIS — M545 Low back pain: Secondary | ICD-10-CM | POA: Diagnosis not present

## 2019-02-16 DIAGNOSIS — M5116 Intervertebral disc disorders with radiculopathy, lumbar region: Secondary | ICD-10-CM | POA: Diagnosis not present

## 2019-02-16 DIAGNOSIS — M47817 Spondylosis without myelopathy or radiculopathy, lumbosacral region: Secondary | ICD-10-CM | POA: Diagnosis not present

## 2019-02-18 ENCOUNTER — Telehealth: Payer: Self-pay | Admitting: *Deleted

## 2019-02-18 NOTE — Telephone Encounter (Signed)
-----   Message from Richardo Priest, MD sent at 02/07/2019  5:09 PM EDT ----- Good result no changes in treatment

## 2019-02-18 NOTE — Telephone Encounter (Signed)
Attempted to contact patient to inform her of lab results with no answer, unable to leave a voicemail. Will continue efforts.

## 2019-02-19 DIAGNOSIS — M47812 Spondylosis without myelopathy or radiculopathy, cervical region: Secondary | ICD-10-CM | POA: Diagnosis not present

## 2019-02-20 DIAGNOSIS — Z79899 Other long term (current) drug therapy: Secondary | ICD-10-CM | POA: Diagnosis not present

## 2019-02-20 DIAGNOSIS — E785 Hyperlipidemia, unspecified: Secondary | ICD-10-CM | POA: Diagnosis not present

## 2019-02-20 DIAGNOSIS — Z6823 Body mass index (BMI) 23.0-23.9, adult: Secondary | ICD-10-CM | POA: Diagnosis not present

## 2019-02-20 DIAGNOSIS — E559 Vitamin D deficiency, unspecified: Secondary | ICD-10-CM | POA: Diagnosis not present

## 2019-02-20 DIAGNOSIS — R5383 Other fatigue: Secondary | ICD-10-CM | POA: Diagnosis not present

## 2019-02-20 DIAGNOSIS — I10 Essential (primary) hypertension: Secondary | ICD-10-CM | POA: Diagnosis not present

## 2019-02-26 ENCOUNTER — Ambulatory Visit: Payer: Medicare Other | Admitting: Cardiology

## 2019-02-27 DIAGNOSIS — M47816 Spondylosis without myelopathy or radiculopathy, lumbar region: Secondary | ICD-10-CM | POA: Diagnosis not present

## 2019-02-27 DIAGNOSIS — M545 Low back pain: Secondary | ICD-10-CM | POA: Diagnosis not present

## 2019-03-06 DIAGNOSIS — H25813 Combined forms of age-related cataract, bilateral: Secondary | ICD-10-CM | POA: Diagnosis not present

## 2019-03-06 DIAGNOSIS — H532 Diplopia: Secondary | ICD-10-CM | POA: Diagnosis not present

## 2019-03-23 ENCOUNTER — Ambulatory Visit (INDEPENDENT_AMBULATORY_CARE_PROVIDER_SITE_OTHER): Payer: Medicare Other | Admitting: *Deleted

## 2019-03-23 DIAGNOSIS — I5032 Chronic diastolic (congestive) heart failure: Secondary | ICD-10-CM | POA: Diagnosis not present

## 2019-03-23 DIAGNOSIS — I442 Atrioventricular block, complete: Secondary | ICD-10-CM

## 2019-03-23 LAB — CUP PACEART REMOTE DEVICE CHECK
Battery Remaining Longevity: 125 mo
Battery Remaining Percentage: 95.5 %
Battery Voltage: 3.01 V
Brady Statistic AP VP Percent: 1 %
Brady Statistic AP VS Percent: 2.3 %
Brady Statistic AS VP Percent: 1 %
Brady Statistic AS VS Percent: 98 %
Brady Statistic RA Percent Paced: 2.2 %
Brady Statistic RV Percent Paced: 1 %
Date Time Interrogation Session: 20200622060012
Implantable Lead Implant Date: 20190620
Implantable Lead Implant Date: 20190620
Implantable Lead Location: 753859
Implantable Lead Location: 753860
Implantable Pulse Generator Implant Date: 20190620
Lead Channel Impedance Value: 390 Ohm
Lead Channel Impedance Value: 510 Ohm
Lead Channel Pacing Threshold Amplitude: 0.75 V
Lead Channel Pacing Threshold Amplitude: 0.75 V
Lead Channel Pacing Threshold Pulse Width: 0.5 ms
Lead Channel Pacing Threshold Pulse Width: 0.5 ms
Lead Channel Sensing Intrinsic Amplitude: 5 mV
Lead Channel Sensing Intrinsic Amplitude: 7.7 mV
Lead Channel Setting Pacing Amplitude: 1 V
Lead Channel Setting Pacing Amplitude: 2 V
Lead Channel Setting Pacing Pulse Width: 0.5 ms
Lead Channel Setting Sensing Sensitivity: 4 mV
Pulse Gen Model: 2272
Pulse Gen Serial Number: 9035930

## 2019-03-25 ENCOUNTER — Encounter: Payer: Self-pay | Admitting: Thoracic Surgery (Cardiothoracic Vascular Surgery)

## 2019-03-26 DIAGNOSIS — H4922 Sixth [abducent] nerve palsy, left eye: Secondary | ICD-10-CM | POA: Diagnosis not present

## 2019-03-31 NOTE — Progress Notes (Signed)
Remote pacemaker transmission.   

## 2019-04-07 ENCOUNTER — Telehealth: Payer: Self-pay | Admitting: Physician Assistant

## 2019-04-07 NOTE — Telephone Encounter (Signed)
Spoke with patient. She received a recall letter yesterday for Nell Range follow up for July 2020. She had a telemed visit with KT on 01/29/19, and echo on 02/04/19, with plan for f/u echo and structural heart visit in 6 months.  She reports ongoing back pain/issues but otherwise no new symptoms.  I adv her of upcoming with Dr. Bettina Gavia (05/08/19) and Nov appointments w structural heart clinic.  Adv that it is likely the letter was sent inadvertently and she does not need a July appointment.  Adv I would forward to structural heart nurse navigator to confirm/call her back.  She is appreciative for the information provided.

## 2019-04-07 NOTE — Telephone Encounter (Signed)
Not sure if pt means Angelena Form and not Jory Sims? Looks like pt is followed by [facility] and has seen Nell Range as well.

## 2019-04-07 NOTE — Telephone Encounter (Signed)
  Patient is calling to make an appt with Jory Sims but schedulers are unable to schedule appts for her

## 2019-04-07 NOTE — Telephone Encounter (Signed)
I spoke with the pt and the recall letter she received for July was an error.  The pt will be due for next follow-up Echo and OV with Nell Range PA-C 08/12/19. I also reminded the pt of upcoming apt in August with Dr Bettina Gavia.

## 2019-04-13 DIAGNOSIS — M810 Age-related osteoporosis without current pathological fracture: Secondary | ICD-10-CM | POA: Diagnosis not present

## 2019-04-13 DIAGNOSIS — Z780 Asymptomatic menopausal state: Secondary | ICD-10-CM | POA: Diagnosis not present

## 2019-04-16 DIAGNOSIS — H4922 Sixth [abducent] nerve palsy, left eye: Secondary | ICD-10-CM | POA: Diagnosis not present

## 2019-04-16 DIAGNOSIS — H532 Diplopia: Secondary | ICD-10-CM | POA: Diagnosis not present

## 2019-04-21 DIAGNOSIS — I809 Phlebitis and thrombophlebitis of unspecified site: Secondary | ICD-10-CM | POA: Diagnosis not present

## 2019-04-21 DIAGNOSIS — R600 Localized edema: Secondary | ICD-10-CM | POA: Diagnosis not present

## 2019-04-21 DIAGNOSIS — Z6823 Body mass index (BMI) 23.0-23.9, adult: Secondary | ICD-10-CM | POA: Diagnosis not present

## 2019-04-23 DIAGNOSIS — M47817 Spondylosis without myelopathy or radiculopathy, lumbosacral region: Secondary | ICD-10-CM | POA: Diagnosis not present

## 2019-04-23 DIAGNOSIS — M545 Low back pain: Secondary | ICD-10-CM | POA: Diagnosis not present

## 2019-04-29 DIAGNOSIS — D3131 Benign neoplasm of right choroid: Secondary | ICD-10-CM | POA: Diagnosis not present

## 2019-04-29 DIAGNOSIS — H353131 Nonexudative age-related macular degeneration, bilateral, early dry stage: Secondary | ICD-10-CM | POA: Diagnosis not present

## 2019-04-29 DIAGNOSIS — D3132 Benign neoplasm of left choroid: Secondary | ICD-10-CM | POA: Diagnosis not present

## 2019-04-29 DIAGNOSIS — H4922 Sixth [abducent] nerve palsy, left eye: Secondary | ICD-10-CM | POA: Diagnosis not present

## 2019-05-04 ENCOUNTER — Telehealth: Payer: Self-pay

## 2019-05-04 NOTE — Telephone Encounter (Signed)

## 2019-05-05 ENCOUNTER — Other Ambulatory Visit: Payer: Self-pay

## 2019-05-05 ENCOUNTER — Ambulatory Visit (INDEPENDENT_AMBULATORY_CARE_PROVIDER_SITE_OTHER): Payer: Medicare Other | Admitting: Cardiology

## 2019-05-05 ENCOUNTER — Encounter: Payer: Self-pay | Admitting: Cardiology

## 2019-05-05 VITALS — BP 128/58 | HR 80 | Ht 60.0 in | Wt 118.0 lb

## 2019-05-05 DIAGNOSIS — I442 Atrioventricular block, complete: Secondary | ICD-10-CM

## 2019-05-05 NOTE — Progress Notes (Signed)
Electrophysiology Office Note   Date:  05/05/2019   ID:  Kaitlin Villarreal, DOB 1926-02-27, MRN [id number]  PCP:  Angelina Sheriff, MD  Cardiologist:  Bettina Gavia Primary Electrophysiologist:  Antrice Pal Meredith Leeds, MD    No chief complaint on file.    History of Present Illness: Kaitlin Villarreal is a 85 y.o. female who is being seen today for the evaluation of complete heart block at the request of Shirlee More. Presenting today for electrophysiology evaluation.  She has a history of severe aortic stenosis status post TAVR, VSD, complete heart block status post [place] pacemaker 9/48/0165, chronic diastolic heart failure, hypertension.  Today, denies symptoms of palpitations, chest pain, shortness of breath, orthopnea, PND, lower extremity edema, claudication, dizziness, presyncope, syncope, bleeding, or neurologic sequela. The patient is tolerating medications without difficulties.    Past Medical History:  Diagnosis Date  . Acute on chronic diastolic heart failure ([place]) 03/18/2018  . Arthritis   . Bone spur of toe of left foot   . Chronic diastolic (congestive) heart failure ([place])   . Essential hypertension   . GERD (gastroesophageal reflux disease)   . HLD (hyperlipidemia)   . Hyperlipidemia 11/25/2016  . Pancreatic mass    a. benign appearing but needs f/u, noted on pre TAVR CTs  . 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 Severe aortic stenosis    a. 03/2018: s/p TAVR by Burt Knack and Dr. Roxy Manns  . Stage 3 chronic kidney disease ([place])   . TIA (transient ischemic attack)     a. 1992  . VSD (ventricular septal defect)    Past Surgical History:  Procedure Laterality Date  . ABDOMINAL HYSTERECTOMY    . APPENDECTOMY    . HERNIA REPAIR    . INTRAOPERATIVE TRANSTHORACIC ECHOCARDIOGRAM N/A 03/18/2018   Procedure: INTRAOPERATIVE  TRANSTHORACIC ECHOCARDIOGRAM;  Surgeon: Sherren Mocha, MD;  Location: [facility];  Service: Open Heart Surgery;  Laterality: N/A;  . PACEMAKER IMPLANT N/A 03/20/2018   Procedure: PACEMAKER IMPLANT;  Surgeon: Constance Haw, MD;  Location: [facility];  Service: Cardiovascular;  Laterality: N/A;  . RIGHT HEART CATH N/A 03/20/2018   Procedure: RIGHT HEART CATH;  Surgeon: Sherren Mocha, MD;  Location: [facility];  Service: Cardiovascular;  Laterality: N/A;  . RIGHT/LEFT HEART CATH AND CORONARY ANGIOGRAPHY N/A 02/06/2018   Procedure: RIGHT/LEFT HEART CATH AND CORONARY ANGIOGRAPHY;  Surgeon: Sherren Mocha, MD;  Location: [facility];  Service: Cardiovascular;  Laterality: N/A;  . TONSILLECTOMY    . TRANSCATHETER AORTIC VALVE REPLACEMENT, TRANSFEMORAL N/A 03/18/2018   Procedure: TRANSCATHETER AORTIC VALVE REPLACEMENT, TRANSFEMORAL;  Surgeon: Sherren Mocha, MD;  Location: [facility];  Service: Open Heart Surgery;  Laterality: N/A;  . WISDOM TOOTH EXTRACTION       Current Outpatient Medications  Medication Sig Dispense Refill  . acetaminophen (TYLENOL) 650 MG CR tablet Take 650-1,300 mg by mouth every 8 (eight) hours as needed for pain.    Marland Kitchen amLODipine (NORVASC) 5 MG tablet Take 1 tablet (5 mg total) by mouth daily. 30 tablet 6  . amoxicillin (AMOXIL) 500 MG tablet Take 4 tablets (2,000 mg total) by mouth as directed. 1 hour prior to dental work including cleanings 12 tablet 12  . aspirin EC 81 MG tablet Take 81 mg by mouth daily.     Marland Kitchen  estradiol (ESTRACE) 1 MG tablet Take 0.5 mg by mouth daily.    . famotidine (PEPCID) 20 MG tablet Take 20 mg by mouth as needed for heartburn or indigestion.    . furosemide (LASIX) 20 MG tablet Take 20 mg by mouth as directed. Take 1 tablet alternating with 2 tablets, and 1 tablet on Saturday and Sunday    . labetalol (NORMODYNE) 100 MG tablet Take 100 mg by mouth 2 (two) times daily.     . Liniments (BLUE-EMU SUPER STRENGTH EX) Apply 1  application topically 3 (three) times daily as needed (for neck pain.).    Marland Kitchen Omega-3 Fatty Acids (FISH OIL) 1000 MG CAPS Take 1,000 mg by mouth daily.    Vladimir Faster Glycol-Propyl Glycol (SYSTANE OP) Place 1 drop into both eyes daily as needed (for dry eyes).    . pravastatin (PRAVACHOL) 20 MG tablet Take 20 mg by mouth at bedtime.     . Probiotic Product (CVS PROBIOTIC) CHEW Chew 2 each by mouth daily after supper.     . vitamin B-12 (CYANOCOBALAMIN) 100 MCG tablet Take 100 mcg by mouth daily.    . Vitamin D, Ergocalciferol, (DRISDOL) 1.25 MG (50000 UT) CAPS capsule TAKE 1 CAPSULE BY MOUTH 2 TIMES A WEEK  1   No current facility-administered medications for this visit.     Allergies:   Levofloxacin, Azithromycin, Clarithromycin, and Latex   Social History:  The patient  reports that she has never smoked. She has never used smokeless tobacco. She reports that she does not drink alcohol or use drugs.   Family History:  The patient's family history includes Congenital heart disease in her brother; Heart attack in her brother; Heart disease in her mother; Uterine cancer in her sister.    ROS:  Please see the history of present illness.   Otherwise, review of systems is positive for none.   All other systems are reviewed and negative.   PHYSICAL EXAM: VS:  BP (!) 128/58   Pulse 80   Ht 5' (1.524 m)   Wt 118 lb (53.5 kg)   BMI 23.05 kg/m  , BMI Body mass index is 23.05 kg/m. GEN: Well nourished, well developed, in no acute distress  HEENT: normal  Neck: no JVD, carotid bruits, or masses Cardiac: RRR; no murmurs, rubs, or gallops,no edema  Respiratory:  clear to auscultation bilaterally, normal work of breathing GI: soft, nontender, nondistended, + BS MS: no deformity or atrophy  Skin: warm and dry, device site well healed Neuro:  Strength and sensation are intact Psych: euthymic mood, full affect  EKG:  EKG is ordered today. Personal review of the ekg ordered shows sinus rhythm   Personal review of the device interrogation today. Results in [facility]: 02/06/2019: BUN 29; Creatinine, Ser 1.29; NT-Pro BNP 1,002; Potassium 5.3; Sodium 142    Lipid Panel     Component Value Date/Time   CHOL 229 (H) 02/06/2019 1140   TRIG 164 (H) 02/06/2019 1140   HDL 90 02/06/2019 1140   CHOLHDL 2.5 02/06/2019 1140   LDLCALC 106 (H) 02/06/2019 1140     Wt Readings from Last 3 Encounters:  05/05/19 118 lb (53.5 kg)  02/06/19 120 lb (54.4 kg)  01/29/19 115 lb (52.2 kg)      Other studies Reviewed: Additional studies/ records that were reviewed today include: TTE 05/05/18  Review of the above records today demonstrates:  - Left ventricle: The cavity size was normal. Wall thickness was  increased in a pattern of moderate LVH. Systolic function was   normal. The estimated ejection fraction was in the range of 60%   to 65%. Wall motion was normal; there were no regional wall   motion abnormalities. Features are consistent with a pseudonormal   left ventricular filling pattern, with concomitant abnormal   relaxation and increased filling pressure (grade 2 diastolic   dysfunction). - Aortic valve: A bioprosthesis was present. There was trivial   regurgitation. Valve area (VTI): 2.93 cm^2. Valve area (Vmax):   2.14 cm^2. Valve area (Vmean): 2.5 cm^2. - Mitral valve: There was mild regurgitation. - Left atrium: The atrium was moderately dilated. - Pulmonary arteries: Systolic pressure was moderately to severely   increased. PA peak pressure: 66 mm Hg (S).   ASSESSMENT AND PLAN:  1.  Complete heart block: Status post [facility] Jude dual-chamber pacemaker.  Device functioning appropriately.  No changes.  2.  Severe aortic stenosis: Status post TAVR  3.  Chronic diastolic heart failure: No signs of volume overload  4.  Hypertension: Currently well controlled  Current medicines are reviewed at length with the patient today.   The patient does not have concerns  regarding her medicines.  The following changes were made today: None  Labs/ tests ordered today include:  Orders Placed This Encounter  Procedures  . EKG 12-Lead     Disposition:   FU with Alexandria Shiflett 12 months  Signed, [place] Jon Meredith Leeds, MD  05/05/2019 2:56 PM     [facility] [address] [place] [address] [phone] (office) [phone] (fax)

## 2019-05-05 NOTE — Patient Instructions (Signed)
Medication Instructions:  Your physician recommends that you continue on your current medications as directed. Please refer to the Current Medication list given to you today.  *If you need a refill on your cardiac medications before your next appointment, please call your pharmacy*  Labwork: None ordered  Testing/Procedures: None ordered  Follow-Up: Remote monitoring is used to monitor your Pacemaker or ICD from home. This monitoring reduces the number of office visits required to check your device to one time per year. It allows [place] to keep an eye on the functioning of your device to ensure it is working properly. You are scheduled for a device check from home on 06/23/2019. You may send your transmission at any time that day. If you have a wireless device, the transmission will be sent automatically. After your physician reviews your transmission, you will receive a postcard with your next transmission date.  Your physician wants you to follow-up in: 1 year with Dr. Curt Bears.  You will receive a reminder letter in the mail two months in advance. If you don't receive a letter, please call our office to schedule the follow-up appointment.  Thank you for choosing [facility]!!   Trinidad Curet, RN [phone]

## 2019-05-07 ENCOUNTER — Other Ambulatory Visit (HOSPITAL_COMMUNITY): Payer: Medicare Other

## 2019-05-07 NOTE — Progress Notes (Signed)
Cardiology Office Note:    Date:  05/08/2019   ID:  GLORIANN RIEDE, DOB 01/29/1926, MRN [id number]  PCP:  Angelina Sheriff, MD  Cardiologist:  Shirlee More, MD    Referring MD: Angelina Sheriff, MD    ASSESSMENT:    1. Hypertensive heart disease with chronic diastolic congestive heart failure ([place])   2. S/P TAVR (transcatheter aortic valve replacement)   3. S/P placement of cardiac pacemaker   4. AV block, 3rd degree (HCC)   5. VSD (ventricular septal defect)   6. Chronic diastolic heart failure (HCC)    PLAN:    In order of problems listed above:  1. AS s/p TAVR - 2/6 holosystolic murmur on exam, stable. Follows with Dr. Copper. Normal TAVR valve function.  2. S/p PPM - [employer] device. Device functioning appropriately by recent check. Follows with Dr. Curt Bears 3. VSD - 2/6 holosystolic murmur on exam, stable. As heart failure is stable, as below, do not think her VSD contributes. Routine echo scheduled for 08/2019 per TAVR team.  4. Chronic diastolic heart failure - Stablem well compensated. NYHA I. No dyspnea, edema. Euvolemic on exam. Continue present diuretics. BMP, ProBNP today.  5. Hypertensive heart disease and kidney disease with chronic diastolic congestive heart failure - BP well controlled today. Euvolemic on exam. Without compliant today. Continue present anti-hypertensive and diuretic regimen. Careful titration of anti-hypertensive and diuretics in the setting of CKD III.   Next appointment: 3 months   Medication Adjustments/Labs and Tests Ordered: Current medicines are reviewed at length with the patient today.  Concerns regarding medicines are outlined above.  Orders Placed This Encounter  Procedures  . Basic Metabolic Panel (BMET)  . Pro b natriuretic peptide   No orders of the defined types were placed in this encounter.   Chief Complaint  Patient presents with  . Follow-up    History of Present Illness:    AIXA CORSELLO is a 85 y.o. female with a  hx of TAVR complicated by heart block necessitating permanent pacemaker small traumatic VSD not hemodynamically significant and heart failure with stage 3 CKD last seen 02/06/2019.  She is doing very well. Her primary concern today is her husband's health; he has an upcoming appointment with me.   Today she denies chest pain, shortness of breath, edema, palpitations. She has been seeing orthopedics for back pain and has had 2 epidurals; she is pleased with the results.   She does show me a small quarter-sized bump on her L shin that appears to be a small hematoma. Recommended she utilize a children's multivitamin for vitamin A and vitamin C to prevent this - she cannot take adult multivitamin due to calcium in the setting of CKDIII.   Compliance with diet, lifestyle and medications: Yes Past Medical History:  Diagnosis Date  . Acute on chronic diastolic heart failure ([place]) 03/18/2018  . Arthritis   . Bone spur of toe of left foot   . Chronic diastolic (congestive) heart failure ([place])   . Essential hypertension   . GERD (gastroesophageal reflux disease)   . HLD (hyperlipidemia)   . Hyperlipidemia 11/25/2016  . Pancreatic mass    a. benign appearing but needs f/u, noted on pre TAVR CTs  . 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 Severe aortic  stenosis    a. 03/2018: s/p TAVR by Burt Knack and Dr. Roxy Manns  . Stage 3 chronic kidney disease ([place])   . TIA (transient ischemic attack)     a. 1992  . VSD (ventricular septal defect)     Past Surgical History:  Procedure Laterality Date  . ABDOMINAL HYSTERECTOMY    . APPENDECTOMY    . HERNIA REPAIR    . INTRAOPERATIVE TRANSTHORACIC ECHOCARDIOGRAM N/A 03/18/2018   Procedure: INTRAOPERATIVE TRANSTHORACIC ECHOCARDIOGRAM;  Surgeon: Sherren Mocha, MD;  Location: [facility];  Service: Open Heart  Surgery;  Laterality: N/A;  . PACEMAKER IMPLANT N/A 03/20/2018   Procedure: PACEMAKER IMPLANT;  Surgeon: Constance Haw, MD;  Location: [facility];  Service: Cardiovascular;  Laterality: N/A;  . RIGHT HEART CATH N/A 03/20/2018   Procedure: RIGHT HEART CATH;  Surgeon: Sherren Mocha, MD;  Location: [facility];  Service: Cardiovascular;  Laterality: N/A;  . RIGHT/LEFT HEART CATH AND CORONARY ANGIOGRAPHY N/A 02/06/2018   Procedure: RIGHT/LEFT HEART CATH AND CORONARY ANGIOGRAPHY;  Surgeon: Sherren Mocha, MD;  Location: [facility];  Service: Cardiovascular;  Laterality: N/A;  . TONSILLECTOMY    . TRANSCATHETER AORTIC VALVE REPLACEMENT, TRANSFEMORAL N/A 03/18/2018   Procedure: TRANSCATHETER AORTIC VALVE REPLACEMENT, TRANSFEMORAL;  Surgeon: Sherren Mocha, MD;  Location: [facility];  Service: Open Heart Surgery;  Laterality: N/A;  . WISDOM TOOTH EXTRACTION      Current Medications: Current Meds  Medication Sig  . acetaminophen (TYLENOL) 650 MG CR tablet Take 650-1,300 mg by mouth every 8 (eight) hours as needed for pain.  Marland Kitchen amLODipine (NORVASC) 5 MG tablet Take 1 tablet (5 mg total) by mouth daily.  Marland Kitchen amoxicillin (AMOXIL) 500 MG tablet Take 4 tablets (2,000 mg total) by mouth as directed. 1 hour prior to dental work including cleanings  . aspirin EC 81 MG tablet Take 81 mg by mouth daily.   Marland Kitchen estradiol (ESTRACE) 1 MG tablet Take 0.5 mg by mouth daily.  . famotidine (PEPCID) 20 MG tablet Take 20 mg by mouth as needed for heartburn or indigestion.  . furosemide (LASIX) 20 MG tablet Take 20 mg by mouth as directed. Take 1 tablet alternating with 2 tablets, and 1 tablet on Saturday and Sunday  . labetalol (NORMODYNE) 100 MG tablet Take 100 mg by mouth 2 (two) times daily.   . Liniments (BLUE-EMU SUPER STRENGTH EX) Apply 1 application topically 3 (three) times daily as needed (for neck pain.).  Marland Kitchen Omega-3 Fatty Acids (FISH OIL) 1000 MG CAPS Take 1,000 mg by mouth daily.  Vladimir Faster Glycol-Propyl Glycol (SYSTANE OP) Place 1 drop into both eyes daily as needed (for dry eyes).  . pravastatin (PRAVACHOL) 20 MG tablet Take 20 mg by mouth at bedtime.   . Probiotic Product ([employer] PROBIOTIC) CHEW Chew 2 each by mouth daily after supper.   . vitamin B-12 (CYANOCOBALAMIN) 100 MCG tablet Take 100 mcg by mouth daily.  . Vitamin D, Ergocalciferol, (DRISDOL) 1.25 MG (50000 UT) CAPS capsule TAKE 1 CAPSULE BY MOUTH 2 TIMES A WEEK     Allergies:   Levofloxacin, Azithromycin, Clarithromycin, and Latex   Social History   Socioeconomic History  . Marital status: Married    Spouse name: Not on file  . Number of children: Not on file  . Years of education: Not on file  . Highest education level: Not on file  Occupational History  . Not on file  Social Needs  . Financial resource strain: Not on file  .  Food insecurity    Worry: Not on file    Inability: Not on file  . Transportation needs    Medical: Not on file    Non-medical: Not on file  Tobacco Use  . Smoking status: Never Smoker  . Smokeless tobacco: Never Used  Substance and Sexual Activity  . Alcohol use: No  . Drug use: No  . Sexual activity: Not on file  Lifestyle  . Physical activity    Days per week: Not on file    Minutes per session: Not on file  . Stress: Not on file  Relationships  . Social Herbalist on phone: Not on file    Gets together: Not on file    Attends religious service: Not on file    Active member of club or organization: Not on file    Attends meetings of clubs or organizations: Not on file    Relationship status: Not on file  Other Topics Concern  . Not on file  Social History Narrative  . Not on file     Family History: The patient's family history includes Congenital heart disease in her brother; Heart attack in her brother; Heart disease in her mother; Uterine cancer in her sister. ROS:   Please see the history of present illness.    All other systems reviewed  and are negative.  EKGs/Labs/Other Studies Reviewed:    The following studies were reviewed today:  02/04/19 echo  1. The left ventricle has normal systolic function with an ejection fraction of 60-65%. The cavity size was normal. There is moderate concentric left ventricular hypertrophy. Left ventricular diastolic Doppler parameters are consistent with  pseudonormalization. Elevated left atrial and left ventricular end-diastolic pressures.  2. The right ventricle has normal systolic function. The cavity was normal. There is no increase in right ventricular wall thickness.  3. No evidence of mitral valve stenosis.  4. Normal TAVR doppler profile. There is paravalvular flow that communicates aorta to the RV.  5. Tricuspid valve regurgitation is mild-moderate.  6. Mild to moderate tricuspid stenosis.  03/23/19 device check Recent pacemaker device check reviewed she has less than 2% ventricular pacing and her heart failure is not due to RV pacing.   EKG:  No EKG today  Recent Labs: 02/06/2019: BUN 29; Creatinine, Ser 1.29; NT-Pro BNP 1,002; Potassium 5.3; Sodium 142  Recent Lipid Panel    Component Value Date/Time   CHOL 229 (H) 02/06/2019 1140   TRIG 164 (H) 02/06/2019 1140   HDL 90 02/06/2019 1140   CHOLHDL 2.5 02/06/2019 1140   LDLCALC 106 (H) 02/06/2019 1140    Physical Exam:    VS:  BP (!) 130/50 (BP Location: Right Arm, Patient Position: Sitting, Cuff Size: Normal)   Pulse 61   Temp 98.4 F (36.9 C)   Ht 5' (1.524 m)   Wt 116 lb (52.6 kg)   SpO2 98%   BMI 22.65 kg/m     Wt Readings from Last 3 Encounters:  05/08/19 116 lb (52.6 kg)  05/05/19 118 lb (53.5 kg)  02/06/19 120 lb (54.4 kg)     GEN:  Well nourished, thin, well developed in no acute distress HEENT: Normal NECK: No JVD; No carotid bruits LYMPHATICS: No lymphadenopathy CARDIAC: RRR, II/VI holosystolic murmur, no rubs, gallops RESPIRATORY:  Clear to auscultation without rales, wheezing or rhonchi  ABDOMEN:  Soft, non-tender, non-distended MUSCULOSKELETAL:  No edema; No deformity  SKIN: Warm and dry NEUROLOGIC:  Alert and oriented x  3 PSYCHIATRIC:  Normal affect   Signed, Shirlee More, MD  05/08/2019 11:41 AM    [place]

## 2019-05-08 ENCOUNTER — Other Ambulatory Visit: Payer: Self-pay

## 2019-05-08 ENCOUNTER — Ambulatory Visit (INDEPENDENT_AMBULATORY_CARE_PROVIDER_SITE_OTHER): Payer: Medicare Other | Admitting: Cardiology

## 2019-05-08 ENCOUNTER — Encounter: Payer: Self-pay | Admitting: Cardiology

## 2019-05-08 VITALS — BP 130/50 | HR 61 | Temp 98.4°F | Ht 60.0 in | Wt 116.0 lb

## 2019-05-08 DIAGNOSIS — I110 Hypertensive heart disease with heart failure: Secondary | ICD-10-CM | POA: Diagnosis not present

## 2019-05-08 DIAGNOSIS — Z952 Presence of prosthetic heart valve: Secondary | ICD-10-CM | POA: Diagnosis not present

## 2019-05-08 DIAGNOSIS — Z950 Presence of cardiac pacemaker: Secondary | ICD-10-CM

## 2019-05-08 DIAGNOSIS — Q210 Ventricular septal defect: Secondary | ICD-10-CM

## 2019-05-08 DIAGNOSIS — N183 Chronic kidney disease, stage 3 unspecified: Secondary | ICD-10-CM

## 2019-05-08 DIAGNOSIS — I5032 Chronic diastolic (congestive) heart failure: Secondary | ICD-10-CM

## 2019-05-08 DIAGNOSIS — I442 Atrioventricular block, complete: Secondary | ICD-10-CM | POA: Diagnosis not present

## 2019-05-08 DIAGNOSIS — I130 Hypertensive heart and chronic kidney disease with heart failure and stage 1 through stage 4 chronic kidney disease, or unspecified chronic kidney disease: Secondary | ICD-10-CM | POA: Diagnosis not present

## 2019-05-08 NOTE — Patient Instructions (Signed)
Medication Instructions:   No changes today.   Recommend children's chewable multivitamin to get Vitamin C and Vitamin A.   If you need a refill on your cardiac medications before your next appointment, please call your pharmacy.   Lab work: [employer] and [employer] today.  If you have labs (blood work) drawn today and your tests are completely normal, you will receive your results only by: Marland Kitchen MyChart Message (if you have MyChart) OR . A paper copy in the mail If you have any lab test that is abnormal or we need to change your treatment, we will call you to review the results.  Testing/Procedures: None ordered today.  Follow-Up: At [facility], you and your health needs are our priority.  As part of our continuing mission to provide you with exceptional heart care, we have created designated Provider Care Teams.  These Care Teams include your primary Cardiologist (physician) and Advanced Practice Providers (APPs -  Physician Assistants and Nurse Practitioners) who all work together to provide you with the care you need, when you need it. You will need a follow up appointment in 3 months.  Please call our office 2 months in advance to schedule this appointment.  You may see Shirlee More, MD or another member of our [facility] Provider Team in [place]: Jenne Campus, MD . Jyl Heinz, MD  Any Other Special Instructions Will Be Listed Below (If Applicable).  It was a pleasure to see you today. So glad you are doing well!

## 2019-05-09 LAB — BASIC METABOLIC PANEL
BUN/Creatinine Ratio: 25 (ref 12–28)
BUN: 35 mg/dL (ref 10–36)
CO2: 23 mmol/L (ref 20–29)
Calcium: 9.4 mg/dL (ref 8.7–10.3)
Chloride: 103 mmol/L (ref 96–106)
Creatinine, Ser: 1.39 mg/dL — ABNORMAL HIGH (ref 0.57–1.00)
GFR calc Af Amer: 38 mL/min/{1.73_m2} — ABNORMAL LOW (ref >59)
GFR calc non Af Amer: 33 mL/min/{1.73_m2} — ABNORMAL LOW (ref >59)
Glucose: 81 mg/dL (ref 65–99)
Potassium: 4.8 mmol/L (ref 3.5–5.2)
Sodium: 140 mmol/L (ref 134–144)

## 2019-05-09 LAB — PRO B NATRIURETIC PEPTIDE: NT-Pro BNP: 1201 pg/mL — ABNORMAL HIGH (ref 0–738)

## 2019-05-15 LAB — CUP PACEART INCLINIC DEVICE CHECK
Date Time Interrogation Session: 20200814161413
Implantable Lead Implant Date: 20190620
Implantable Lead Implant Date: 20190620
Implantable Lead Location: 753859
Implantable Lead Location: 753860
Implantable Pulse Generator Implant Date: 20190620
Pulse Gen Model: 2272
Pulse Gen Serial Number: 9035930

## 2019-06-23 ENCOUNTER — Ambulatory Visit (INDEPENDENT_AMBULATORY_CARE_PROVIDER_SITE_OTHER): Payer: Medicare Other | Admitting: *Deleted

## 2019-06-23 DIAGNOSIS — I5032 Chronic diastolic (congestive) heart failure: Secondary | ICD-10-CM | POA: Diagnosis not present

## 2019-06-24 LAB — CUP PACEART REMOTE DEVICE CHECK
Battery Remaining Longevity: 122 mo
Battery Remaining Percentage: 95.5 %
Battery Voltage: 3.01 V
Brady Statistic AP VP Percent: 1 %
Brady Statistic AP VS Percent: 2 %
Brady Statistic AS VP Percent: 1 %
Brady Statistic AS VS Percent: 98 %
Brady Statistic RA Percent Paced: 1.9 %
Brady Statistic RV Percent Paced: 1 %
Date Time Interrogation Session: 20200921073941
Implantable Lead Implant Date: 20190620
Implantable Lead Implant Date: 20190620
Implantable Lead Location: 753859
Implantable Lead Location: 753860
Implantable Pulse Generator Implant Date: 20190620
Lead Channel Impedance Value: 380 Ohm
Lead Channel Impedance Value: 510 Ohm
Lead Channel Pacing Threshold Amplitude: 0.75 V
Lead Channel Pacing Threshold Amplitude: 0.875 V
Lead Channel Pacing Threshold Pulse Width: 0.5 ms
Lead Channel Pacing Threshold Pulse Width: 0.5 ms
Lead Channel Sensing Intrinsic Amplitude: 5 mV
Lead Channel Sensing Intrinsic Amplitude: 7.5 mV
Lead Channel Setting Pacing Amplitude: 1.125
Lead Channel Setting Pacing Amplitude: 2 V
Lead Channel Setting Pacing Pulse Width: 0.5 ms
Lead Channel Setting Sensing Sensitivity: 2 mV
Pulse Gen Model: 2272
Pulse Gen Serial Number: 9035930

## 2019-06-25 DIAGNOSIS — I350 Nonrheumatic aortic (valve) stenosis: Secondary | ICD-10-CM | POA: Diagnosis not present

## 2019-06-25 DIAGNOSIS — Q210 Ventricular septal defect: Secondary | ICD-10-CM | POA: Diagnosis not present

## 2019-06-25 DIAGNOSIS — K8689 Other specified diseases of pancreas: Secondary | ICD-10-CM | POA: Diagnosis not present

## 2019-06-25 DIAGNOSIS — Z950 Presence of cardiac pacemaker: Secondary | ICD-10-CM | POA: Diagnosis not present

## 2019-06-25 DIAGNOSIS — E785 Hyperlipidemia, unspecified: Secondary | ICD-10-CM | POA: Diagnosis not present

## 2019-06-25 DIAGNOSIS — N183 Chronic kidney disease, stage 3 (moderate): Secondary | ICD-10-CM | POA: Diagnosis not present

## 2019-06-25 DIAGNOSIS — N2581 Secondary hyperparathyroidism of renal origin: Secondary | ICD-10-CM | POA: Diagnosis not present

## 2019-06-25 DIAGNOSIS — D631 Anemia in chronic kidney disease: Secondary | ICD-10-CM | POA: Diagnosis not present

## 2019-06-25 DIAGNOSIS — I129 Hypertensive chronic kidney disease with stage 1 through stage 4 chronic kidney disease, or unspecified chronic kidney disease: Secondary | ICD-10-CM | POA: Diagnosis not present

## 2019-06-30 DIAGNOSIS — H2512 Age-related nuclear cataract, left eye: Secondary | ICD-10-CM | POA: Diagnosis not present

## 2019-06-30 DIAGNOSIS — Z01818 Encounter for other preprocedural examination: Secondary | ICD-10-CM | POA: Diagnosis not present

## 2019-07-02 DIAGNOSIS — M545 Low back pain: Secondary | ICD-10-CM | POA: Diagnosis not present

## 2019-07-02 NOTE — Progress Notes (Signed)
Remote pacemaker transmission.   

## 2019-07-07 DIAGNOSIS — Z0000 Encounter for general adult medical examination without abnormal findings: Secondary | ICD-10-CM | POA: Diagnosis not present

## 2019-07-07 DIAGNOSIS — Z6823 Body mass index (BMI) 23.0-23.9, adult: Secondary | ICD-10-CM | POA: Diagnosis not present

## 2019-07-07 DIAGNOSIS — Z1331 Encounter for screening for depression: Secondary | ICD-10-CM | POA: Diagnosis not present

## 2019-07-07 DIAGNOSIS — Z23 Encounter for immunization: Secondary | ICD-10-CM | POA: Diagnosis not present

## 2019-07-07 DIAGNOSIS — I10 Essential (primary) hypertension: Secondary | ICD-10-CM | POA: Diagnosis not present

## 2019-07-07 IMAGING — CR DG CHEST 2V
2 series · 2 of 2 positions shown · non-contrast
Comparison: Chest x-ray dated 01/29/2018.

CLINICAL DATA: Pre-admission for heart valve replacement. Shortness
of breath today.

EXAM:
CHEST - 2 VIEW

[w chest pa]
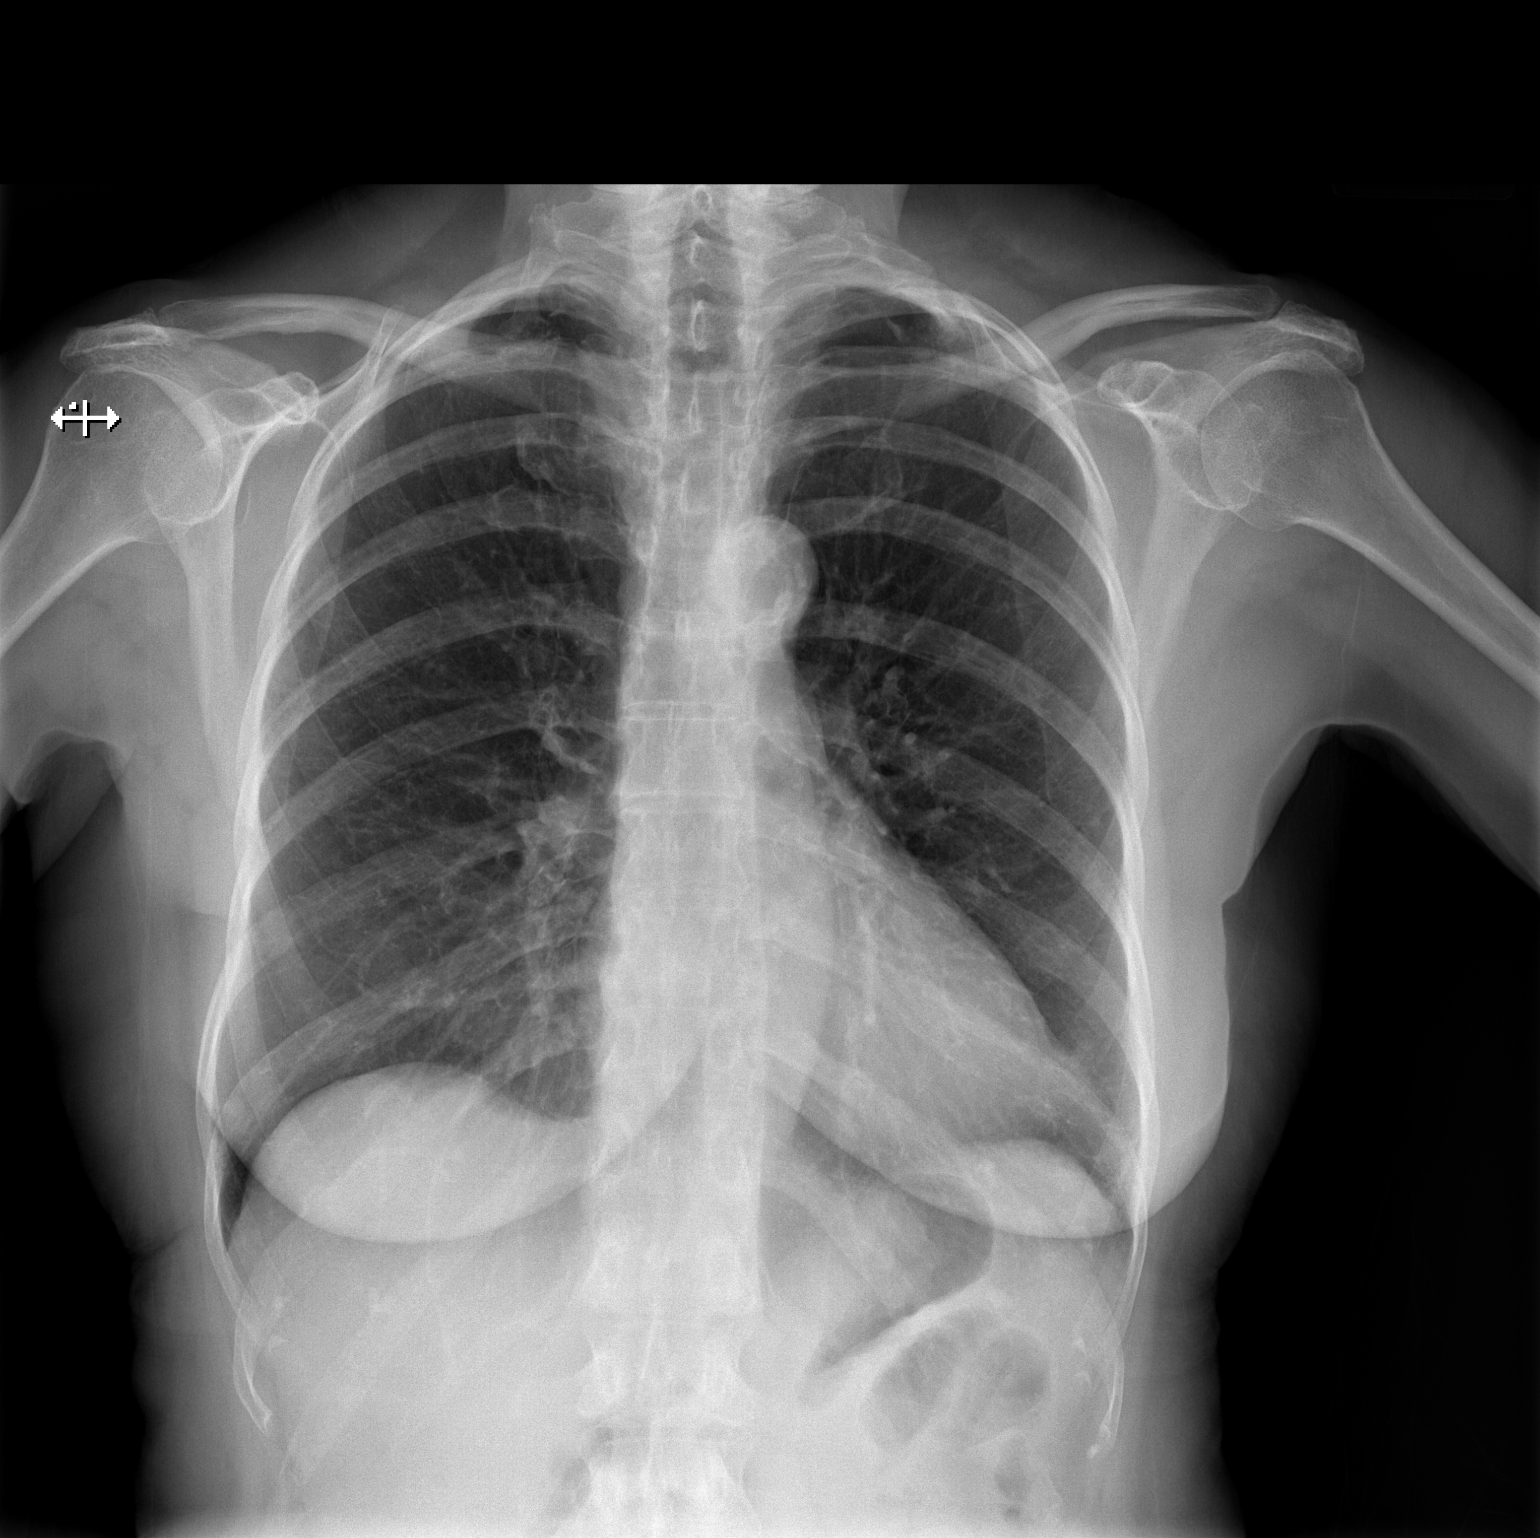

[w chest lat]
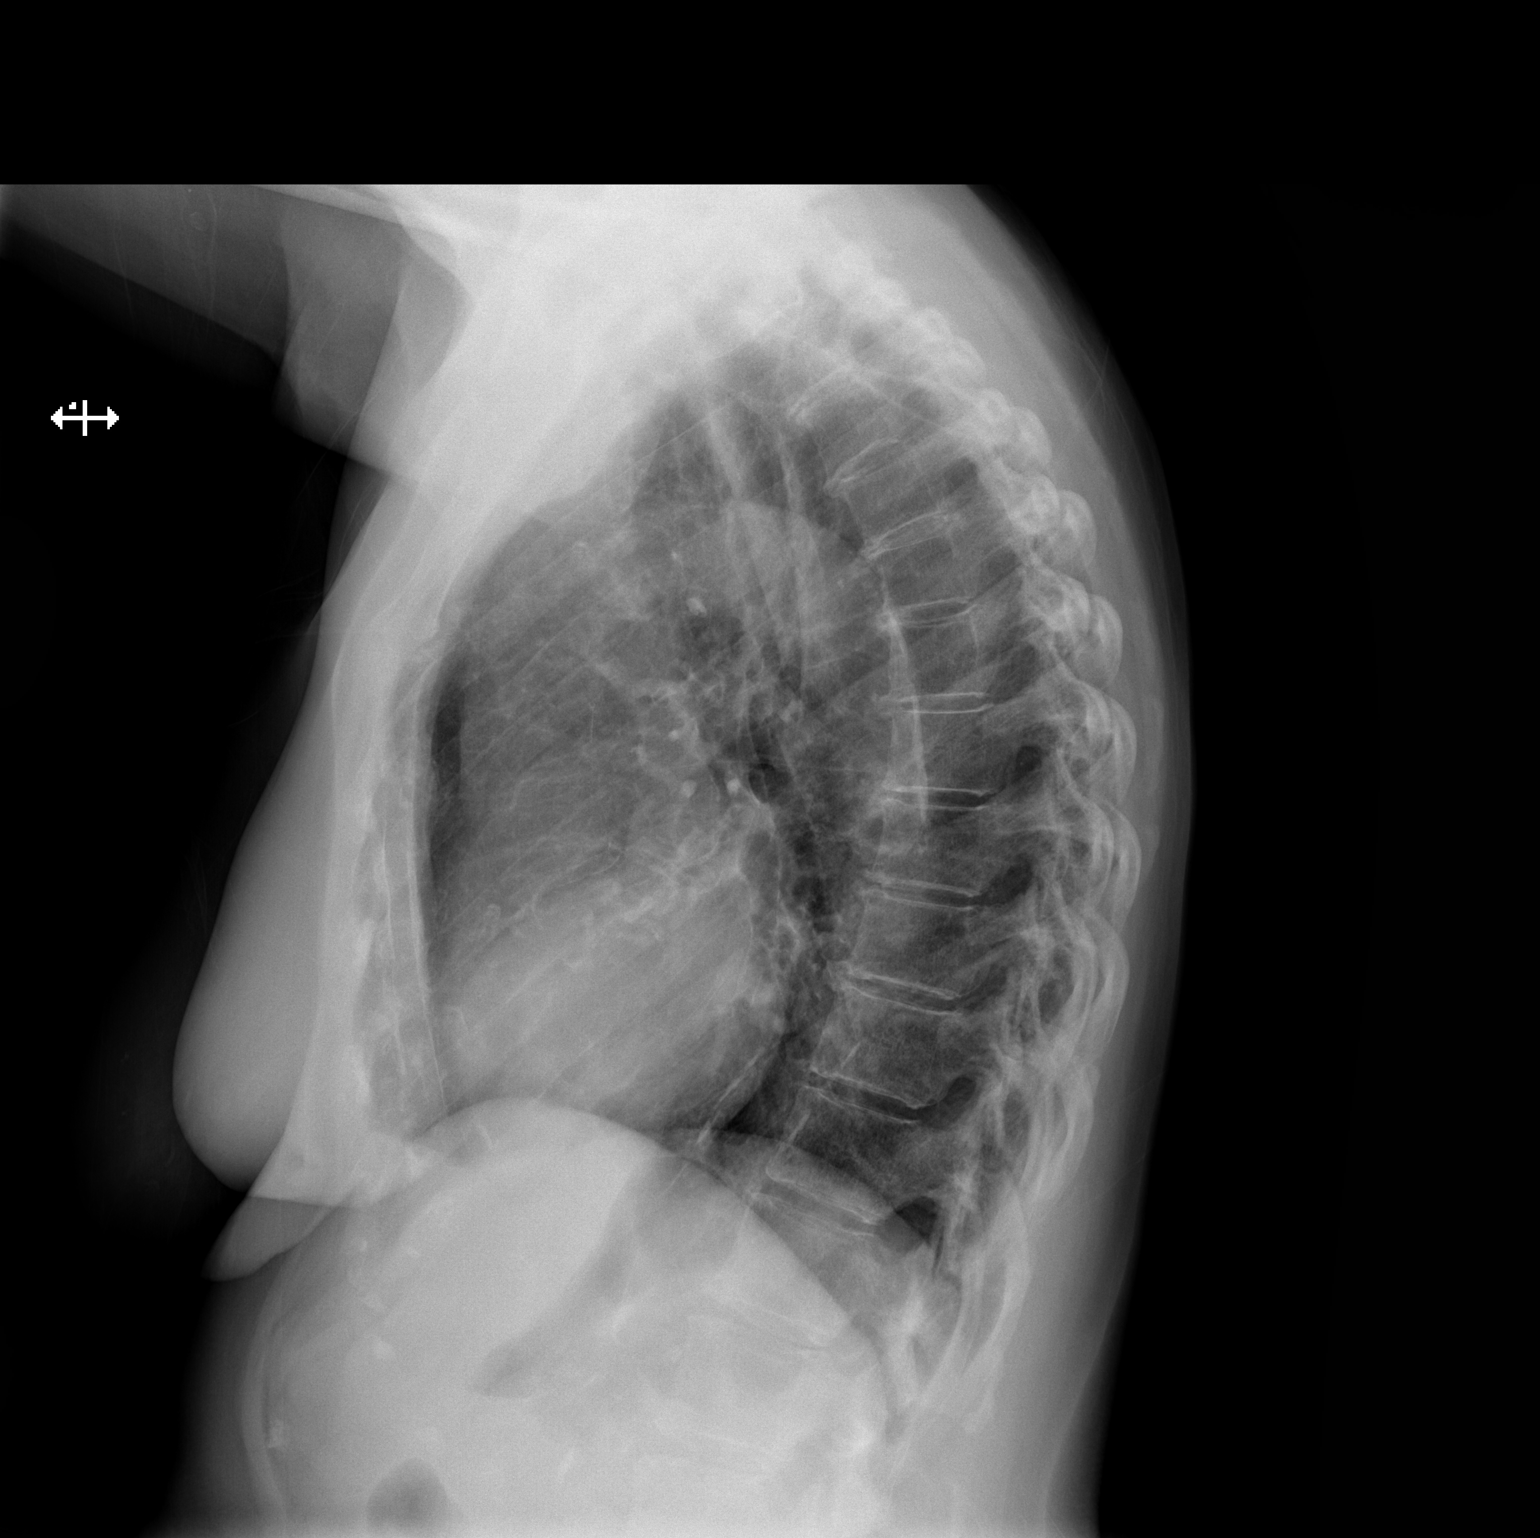

[2 of 2 positions shown; findings below may reference images not displayed]

FINDINGS: Heart size is upper normal, stable. Overall cardiomediastinal
silhouette is stable. Atherosclerotic changes again noted at the
aortic arch.

Lungs are clear. No pleural effusion or pneumothorax seen. Mild
degenerative spurring within the kyphotic thoracic spine. No acute
or suspicious osseous finding.
IMPRESSION: 1. No active cardiopulmonary disease. No evidence of pneumonia or
pulmonary edema.
2. Aortic atherosclerosis.

## 2019-07-11 IMAGING — DX DG CHEST 1V PORT
1 series · 1 of 1 positions shown · non-contrast
Comparison: 03/14/2018

CLINICAL DATA: Status post TAVR

EXAM:
PORTABLE CHEST 1 VIEW

[chest ap]
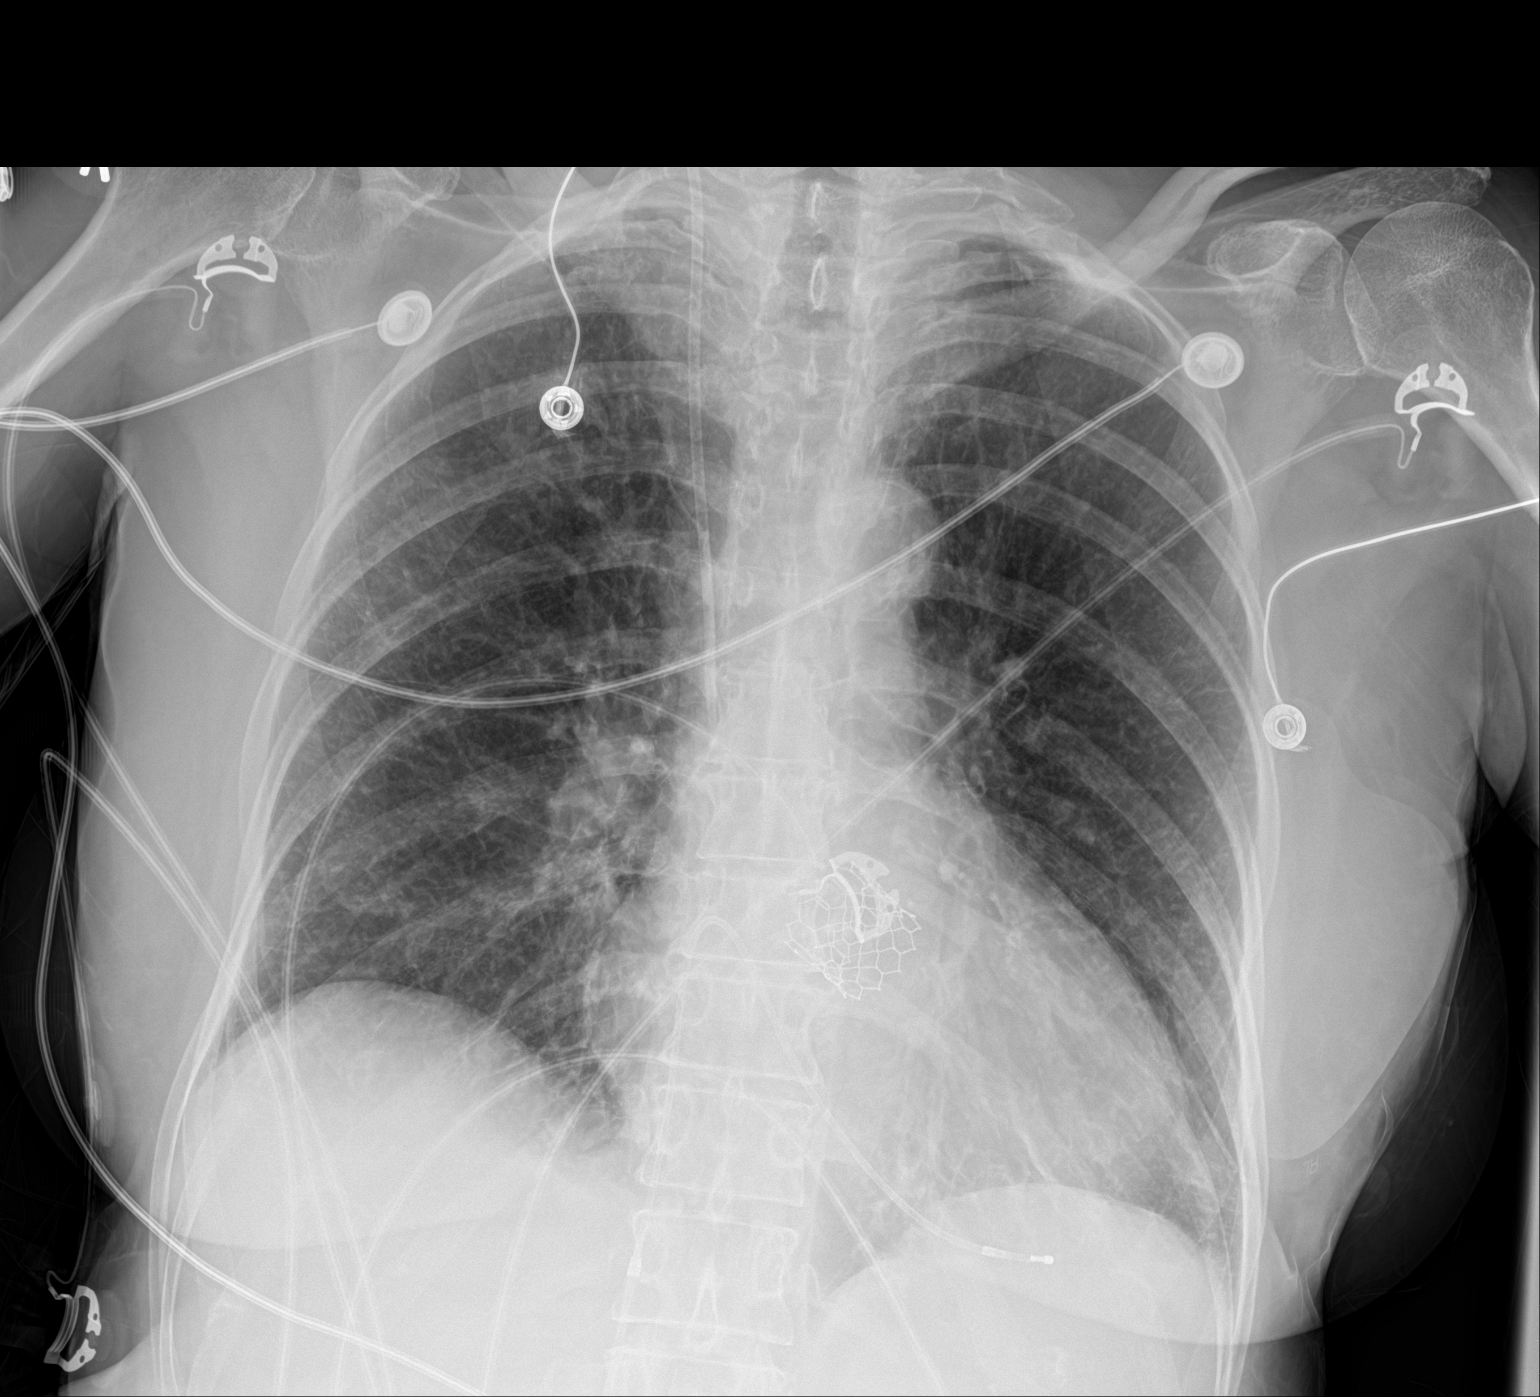

[1 of 1 positions shown; findings below may reference images not displayed]

FINDINGS: Cardiac shadow is at the upper limits of normal in size but
accentuated by the portable technique. Right jugular central line,
temporary pacing device and trans aortic valve prosthesis are now
seen. Lungs are well aerated bilaterally without focal infiltrate.
No acute bony abnormality is noted.
IMPRESSION: Tubes and lines as described.

Status post TAVR.

## 2019-07-14 DIAGNOSIS — H259 Unspecified age-related cataract: Secondary | ICD-10-CM | POA: Diagnosis not present

## 2019-07-14 DIAGNOSIS — Z7982 Long term (current) use of aspirin: Secondary | ICD-10-CM | POA: Diagnosis not present

## 2019-07-14 DIAGNOSIS — H2512 Age-related nuclear cataract, left eye: Secondary | ICD-10-CM | POA: Diagnosis not present

## 2019-07-14 DIAGNOSIS — Z79899 Other long term (current) drug therapy: Secondary | ICD-10-CM | POA: Diagnosis not present

## 2019-07-14 IMAGING — DX DG CHEST 2V
2 series · 2 of 2 positions shown · non-contrast
Comparison: 03/18/2018.

CLINICAL DATA: Cardiac device in situ.

EXAM:
CHEST - 2 VIEW

[chest lat]
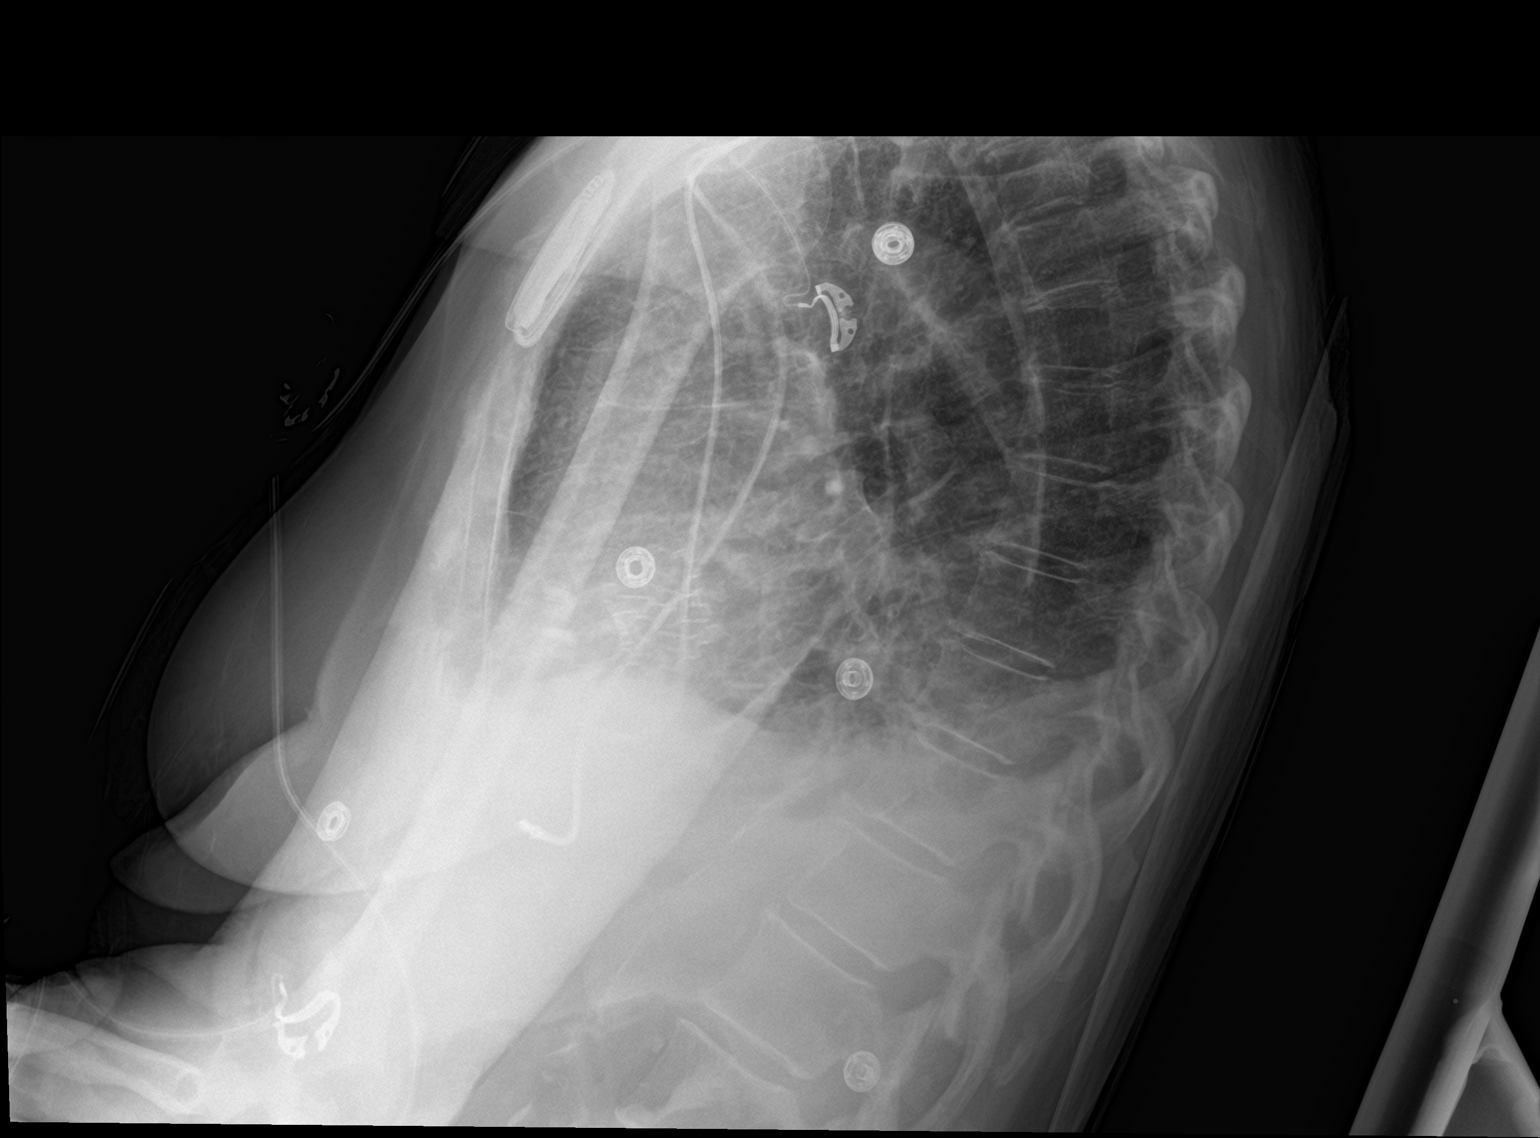

[chest ap]
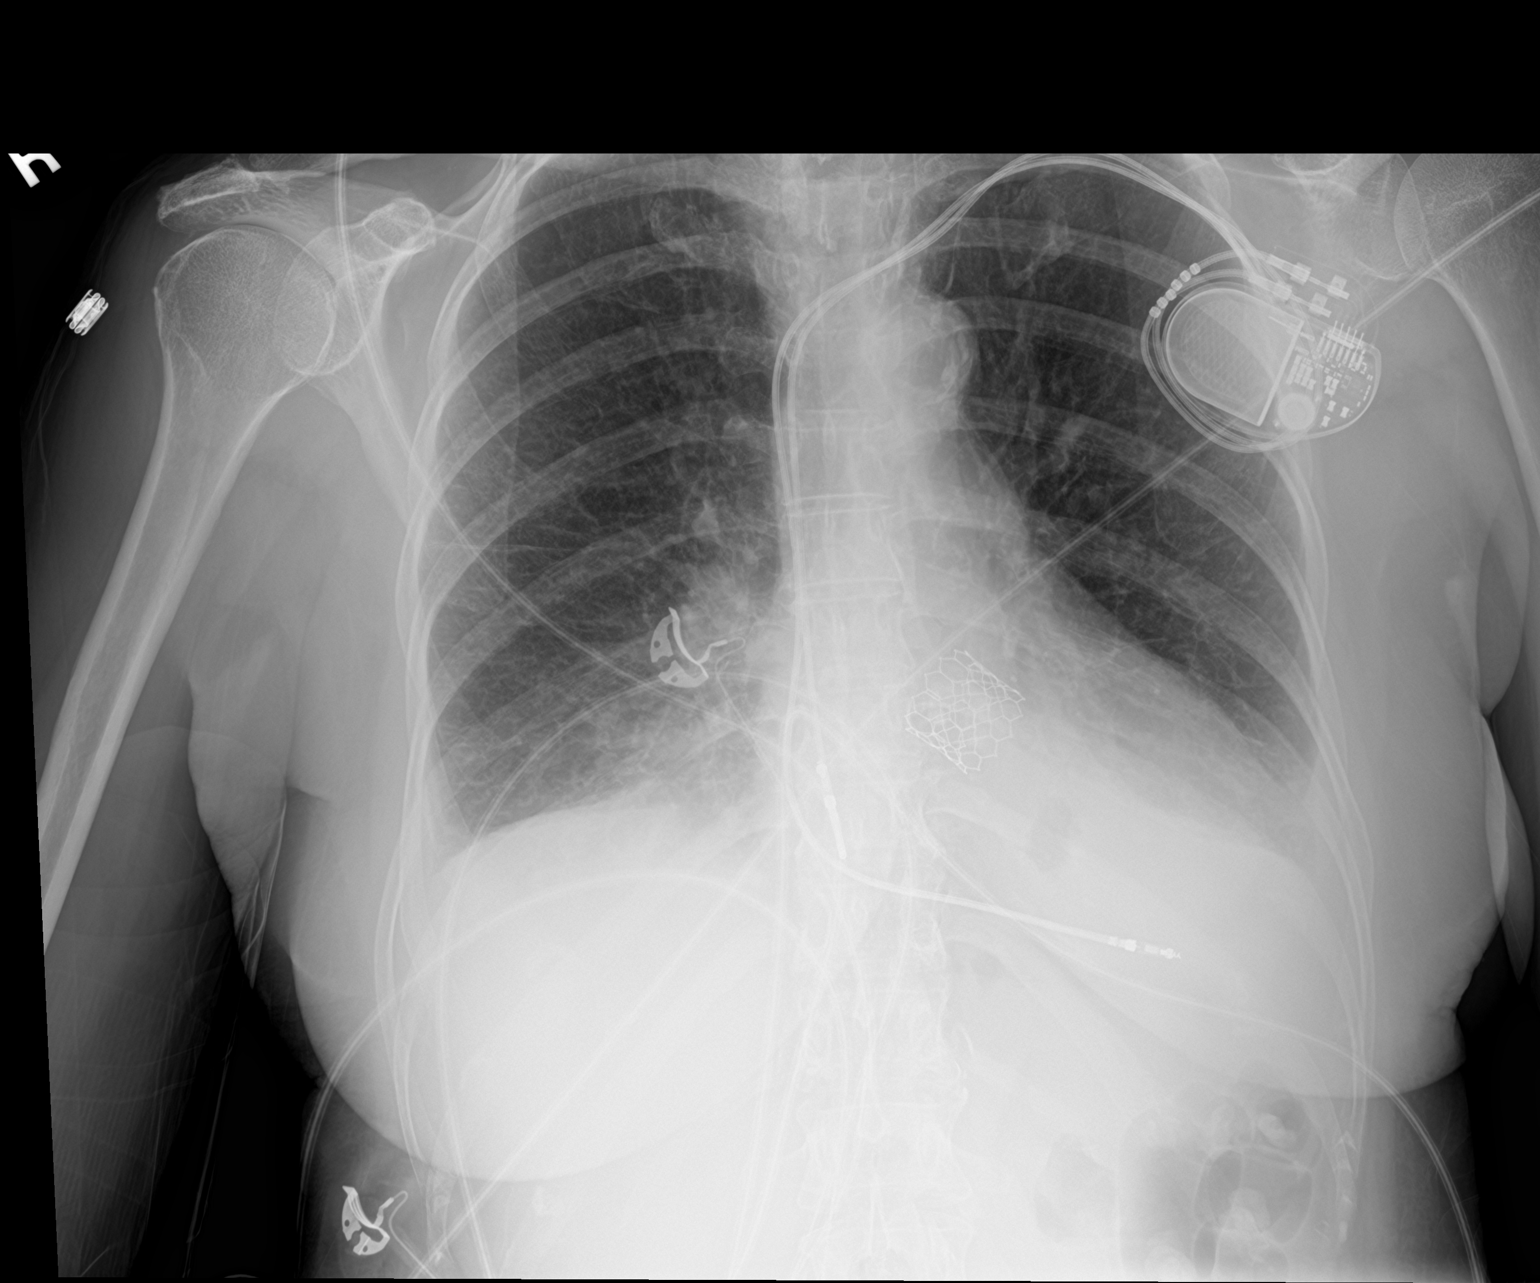

[2 of 2 positions shown; findings below may reference images not displayed]

FINDINGS: Interim removal of temporary pacer. Interim placement of cardiac
pacer with lead tips over the right atrium right ventricle. Prior
cardiac valve replacement. Heart size normal. Bibasilar
atelectasis/infiltrates and small bilateral pleural effusions. No
pneumothorax.
IMPRESSION: 1. Interim removal of temporary pacer. Interim placement of cardiac
pacer with lead tips in right atrium right ventricle.

2. Bibasilar atelectasis/infiltrates and small bilateral pleural
effusions noted on today's exam.

3.  Prior cardiac valve replacement.  Heart size stable.

## 2019-08-11 DIAGNOSIS — Z79899 Other long term (current) drug therapy: Secondary | ICD-10-CM | POA: Diagnosis not present

## 2019-08-11 DIAGNOSIS — K219 Gastro-esophageal reflux disease without esophagitis: Secondary | ICD-10-CM | POA: Diagnosis not present

## 2019-08-11 DIAGNOSIS — Z950 Presence of cardiac pacemaker: Secondary | ICD-10-CM | POA: Diagnosis not present

## 2019-08-11 DIAGNOSIS — Z952 Presence of prosthetic heart valve: Secondary | ICD-10-CM | POA: Diagnosis not present

## 2019-08-11 DIAGNOSIS — H2511 Age-related nuclear cataract, right eye: Secondary | ICD-10-CM | POA: Diagnosis not present

## 2019-08-11 DIAGNOSIS — M1990 Unspecified osteoarthritis, unspecified site: Secondary | ICD-10-CM | POA: Diagnosis not present

## 2019-08-11 DIAGNOSIS — I10 Essential (primary) hypertension: Secondary | ICD-10-CM | POA: Diagnosis not present

## 2019-08-11 DIAGNOSIS — H259 Unspecified age-related cataract: Secondary | ICD-10-CM | POA: Diagnosis not present

## 2019-08-11 DIAGNOSIS — E785 Hyperlipidemia, unspecified: Secondary | ICD-10-CM | POA: Diagnosis not present

## 2019-08-11 DIAGNOSIS — H52221 Regular astigmatism, right eye: Secondary | ICD-10-CM | POA: Diagnosis not present

## 2019-08-11 DIAGNOSIS — Z7982 Long term (current) use of aspirin: Secondary | ICD-10-CM | POA: Diagnosis not present

## 2019-08-11 NOTE — Progress Notes (Signed)
[facility]                                       Cardiology Office Note    Date:  08/13/2019   ID:  Kaitlin Villarreal, DOB June 26, 1926, MRN [id number]  PCP:  Angelina Sheriff, MD  Cardiologist:  Dr. Bettina Gavia  CC: follow up VSD s/p TAVR  History of Present Illness:  Kaitlin Villarreal is a 85 y.o. female with a history of HTN, HLD, CKD and severe ASs/p TAVR ([id number]) complicated by VSD who presents for follow up.   She underwentsuccessful TAVR with a50mm Edwards Sapien 3 THV via the TF approach on 03/18/18.Surgery was c/b a small VSD that remained stable by serial echocardiograms. She also developed CHBrequiring pacemaker placement. POD1 echo showed EF 65% and a normally functioning TAVR valve with no PVL and mean gradient not reported. [place] 03/20/18 did not show any significant L-->R shuntingof the VSD. She was discharged on ASA alone. Follow up limited echo 03/17/18 showed stable VSD. Plavix was added. At her follow up she complained of right groin pain. LE doppler showed no pseudoaneurysm.   She has been followed since that time by Dr. Bettina Gavia and Dr. Curt Bears. She has continued to have shortness of breath and diuretics have been adjusted. 1 year echo s/p TAVR completed on 02/04/19 showed EF 60-65%, normally functioning TAVR with paravalvular flow that communicates aorta to the RV. Dr. Burt Knack reviewed the study and felt that the shunt was a persistent perimembranous VSD, stable from previous studies. Follow up echo in 6 months was recommended.   Today she presents to clinic for follow up.  She is doing okay. Had cataract surgery yesterday. Has some LE edema for which she basically does sliding scale lasix. Swelling worse at the end of the day. No CP. SOB has improved.   No orthopnea or PND. No dizziness or syncope. No blood in stool or urine. No palpitations. She is mostly limited by back pain.   Past Medical History:  Diagnosis Date  . Acute on  chronic diastolic heart failure ([place]) 03/18/2018  . Arthritis   . Bone spur of toe of left foot   . Chronic diastolic (congestive) heart failure ([place])   . Essential hypertension   . GERD (gastroesophageal reflux disease)   . HLD (hyperlipidemia)   . Hyperlipidemia 11/25/2016  . Pancreatic mass    a. benign appearing but needs f/u, noted on pre TAVR CTs  . 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 Severe aortic stenosis    a. 03/2018: s/p TAVR by Burt Knack and Dr. Roxy Manns  . Stage 3 chronic kidney disease   . TIA (transient ischemic attack)     a. 1992  . VSD (ventricular septal defect)     Past Surgical History:  Procedure Laterality Date  . ABDOMINAL HYSTERECTOMY    . APPENDECTOMY    . HERNIA REPAIR    . INTRAOPERATIVE TRANSTHORACIC ECHOCARDIOGRAM N/A 03/18/2018   Procedure: INTRAOPERATIVE TRANSTHORACIC ECHOCARDIOGRAM;  Surgeon: Sherren Mocha, MD;  Location: [facility];  Service: Open Heart Surgery;  Laterality: N/A;  . PACEMAKER IMPLANT N/A 03/20/2018   Procedure: PACEMAKER IMPLANT;  Surgeon: Constance Haw, MD;  Location: [facility];  Service: Cardiovascular;  Laterality: N/A;  . RIGHT HEART CATH N/A 03/20/2018   Procedure: RIGHT HEART CATH;  Surgeon: Sherren Mocha, MD;  Location: [facility];  Service: Cardiovascular;  Laterality: N/A;  . RIGHT/LEFT HEART CATH AND CORONARY ANGIOGRAPHY N/A 02/06/2018   Procedure: RIGHT/LEFT HEART CATH AND CORONARY ANGIOGRAPHY;  Surgeon: Sherren Mocha, MD;  Location: [facility];  Service: Cardiovascular;  Laterality: N/A;  . TONSILLECTOMY    . TRANSCATHETER AORTIC VALVE REPLACEMENT, TRANSFEMORAL N/A 03/18/2018   Procedure: TRANSCATHETER AORTIC VALVE REPLACEMENT, TRANSFEMORAL;  Surgeon: Sherren Mocha, MD;  Location: [facility];  Service: Open Heart Surgery;  Laterality:  N/A;  . WISDOM TOOTH EXTRACTION      Current Medications: Outpatient Medications Prior to Visit  Medication Sig Dispense Refill  . acetaminophen (TYLENOL) 650 MG CR tablet Take 650-1,300 mg by mouth every 8 (eight) hours as needed for pain.    Marland Kitchen amLODipine (NORVASC) 5 MG tablet Take 1 tablet (5 mg total) by mouth daily. 30 tablet 6  . amoxicillin (AMOXIL) 500 MG tablet Take 4 tablets (2,000 mg total) by mouth as directed. 1 hour prior to dental work including cleanings 12 tablet 12  . aspirin EC 81 MG tablet Take 81 mg by mouth daily.     Marland Kitchen estradiol (ESTRACE) 1 MG tablet Take 0.5 mg by mouth daily.    . famotidine (PEPCID) 20 MG tablet Take 20 mg by mouth as needed for heartburn or indigestion.    . furosemide (LASIX) 20 MG tablet Take 20 mg by mouth as directed. Take 1 tablet alternating with 2 tablets, and 1 tablet on Saturday and Sunday    . labetalol (NORMODYNE) 100 MG tablet Take 100 mg by mouth 2 (two) times daily.     . Liniments (BLUE-EMU SUPER STRENGTH EX) Apply 1 application topically 3 (three) times daily as needed (for neck pain.).    Marland Kitchen Omega-3 Fatty Acids (FISH OIL) 1000 MG CAPS Take 1,000 mg by mouth daily.    Vladimir Faster Glycol-Propyl Glycol (SYSTANE OP) Place 1 drop into both eyes daily as needed (for dry eyes).    . pravastatin (PRAVACHOL) 20 MG tablet Take 20 mg by mouth at bedtime.     . Probiotic Product ([employer] PROBIOTIC) CHEW Chew 2 each by mouth daily after supper.     . vitamin B-12 (CYANOCOBALAMIN) 100 MCG tablet Take 100 mcg by mouth daily.    . Vitamin D, Ergocalciferol, (DRISDOL) 1.25 MG (50000 UT) CAPS capsule TAKE 1 CAPSULE BY MOUTH 2 TIMES A WEEK  1   No facility-administered medications prior to visit.      Allergies:   Levofloxacin, Azithromycin, Clarithromycin, and Latex   Social History   Socioeconomic History  . Marital status: Married    Spouse name: Not on file  . Number of children: Not on file  . Years of education: Not on file  . Highest  education level: Not on file  Occupational History  . Not on file  Social Needs  . Financial resource strain: Not on file  . Food insecurity    Worry: Not on file    Inability: Not on file  . Transportation needs    Medical: Not on file    Non-medical: Not on file  Tobacco Use  . Smoking status: Never Smoker  . Smokeless tobacco: Never Used  Substance and Sexual Activity  . Alcohol use: No  . Drug use: No  . Sexual activity:  Not on file  Lifestyle  . Physical activity    Days per week: Not on file    Minutes per session: Not on file  . Stress: Not on file  Relationships  . Social Herbalist on phone: Not on file    Gets together: Not on file    Attends religious service: Not on file    Active member of club or organization: Not on file    Attends meetings of clubs or organizations: Not on file    Relationship status: Not on file  Other Topics Concern  . Not on file  Social History Narrative  . Not on file     Family History:  The patient's family history includes Congenital heart disease in her brother; Heart attack in her brother; Heart disease in her mother; Uterine cancer in her sister.     ROS:   Please see the history of present illness.    ROS All other systems reviewed and are negative.   PHYSICAL EXAM:   VS:  BP (!) 128/56   Pulse 76   Ht 5' (1.524 m)   Wt 121 lb 1.9 oz (54.9 kg)   SpO2 98%   BMI 23.65 kg/m    GEN: Well nourished, well developed, in no acute distress HEENT: normal Neck: no JVD or masses Cardiac: RRR; 2/6 holosystolic murmur. No rubs, or gallops. 1 + bilateral LE edema Respiratory:  clear to auscultation bilaterally, normal work of breathing GI: soft, nontender, nondistended, + BS MS: no deformity or atrophy Skin: warm and dry, no rash Neuro:  Alert and Oriented x 3, Strength and sensation are intact Psych: euthymic mood, full affect   Wt Readings from Last 3 Encounters:  08/12/19 121 lb 1.9 oz (54.9 kg)  05/08/19 116  lb (52.6 kg)  05/05/19 118 lb (53.5 kg)      Studies/Labs Reviewed:   EKG:  EKG is NOT ordered today.   Recent Labs: 05/08/2019: NT-Pro BNP 1,201 08/12/2019: BUN 32; Creatinine, Ser 1.27; Potassium 4.6; Sodium 140   Lipid Panel    Component Value Date/Time   CHOL 229 (H) 02/06/2019 1140   TRIG 164 (H) 02/06/2019 1140   HDL 90 02/06/2019 1140   CHOLHDL 2.5 02/06/2019 1140   LDLCALC 106 (H) 02/06/2019 1140    Additional studies/ records that were reviewed today include:  TAVR OPERATIVE NOTE   Date of Procedure:03/18/2018  Preoperative Diagnosis:Severe Aortic Stenosis   Procedure:   Transcatheter Aortic Valve Replacement - PercutaneousRightTransfemoral Approach Edwards Sapien 3 THV (size 19mm, model # 9600TFX, serial # [id number])  Co-Surgeons:Clarence H. Roxy Manns, MD and Sherren Mocha, MD  Pre-operative Echo Findings: ? Severe aortic stenosis ? Normalleft ventricular systolic function  Post-operative Echo Findings: ? Noparavalvular leak ? Very small perimembranous ventricular septal defect ? No pericardial effusion ? Normalleft ventricular systolic function  _______________  Limited echo 03/18/18 5 hours later a limited TTE was performed. It showed unchanged small VSD, stable aortic valve with normal unchanged gradients. No paravalvular leak. Trivial pericardial effusion  _______________  Post operative echo 03/19/18 Study Conclusions - Left ventricle: The cavity size was normal. Wall thickness was increased in a pattern of severe LVH. There was focal basal hypertrophy. Systolic function was vigorous. The estimated ejection fraction was in the range of 65% to 70%. Wall motion was normal; there were no regional wall motion abnormalities. Doppler parameters are consistent with abnormal left ventricular relaxation (grade 1 diastolic dysfunction). - Aortic valve: A  bioprosthesis was  present. - Mitral valve: Mildly calcified annulus. Moderately thickened, moderately calcified leaflets . There was mild regurgitation. - Pulmonary arteries: Systolic pressure was moderately increased. PA peak pressure: 53 mm Hg (S).  _______________  03/21/18 RIGHT HEART CATH  Conclusion   1. No significant left to right shunting based on absence of a significant O2 step up from the SVC to the pulmonary artery 2. Elevated right and left heart intracardiac filling pressures consistent with acute on chronic diastolic heart failure  Plan: Serial echo follow-up of the patient's small perimembranous VSD, IV diuresis and medical therapy for treatment of congestive heart failure, permanent pacemaker placement per Dr. Curt Bears to follow    _______________   03/20/18 SURGEON: Allegra Lai, MD   PREPROCEDURE DIAGNOSIS:Complete AV block  POSTPROCEDURE DIAGNOSIS:Complete AV block  PROCEDURES:  1. Pacemaker implantation. 2. Left upper extremity venography.    _______________  Limited echo 03/27/18 Study Conclusions - Left ventricle: Small perimembranous VSD with L to R flow. The cavity size was normal. Wall thickness was normal. Systolic function was vigorous. The estimated ejection fraction was in the range of 65% to 70%. - Mitral valve: Calcified annulus. Mildly thickened leaflets .  _______________  2D ECHO 04/17/18 (1 month s/p TAVR) Study Conclusions - Left ventricle: The cavity size was normal. Wall thickness was increased in a pattern of moderate LVH. Systolic function was normal. The estimated ejection fraction was in the range of 60% to 65%. Wall motion was normal; there were no regional wall motion abnormalities. Features are consistent with a pseudonormal left ventricular filling pattern, with concomitant abnormal relaxation and increased filling pressure (grade 2 diastolic dysfunction). - Aortic  valve: A bioprosthesis was present. There was trivial regurgitation. Valve area (VTI): 2.93 cm^2. Valve area (Vmax): 2.14 cm^2. Valve area (Vmean): 2.5 cm^2. - Mitral valve: There was mild regurgitation. - Left atrium: The atrium was moderately dilated. - Pulmonary arteries: Systolic pressure was moderately to severely increased. PA peak pressure: 66 mm Hg (S).  _______________  2D ECHO 02/04/19 ( 1 year s/p TAVR) IMPRESSIONS  1. The left ventricle has normal systolic function with an ejection fraction of 60-65%. The cavity size was normal. There is moderate concentric left ventricular hypertrophy. Left ventricular diastolic Doppler parameters are consistent with  pseudonormalization. Elevated left atrial and left ventricular end-diastolic pressures.  2. The right ventricle has normal systolic function. The cavity was normal. There is no increase in right ventricular wall thickness.  3. No evidence of mitral valve stenosis.  4. Normal TAVR doppler profile. There is paravalvular flow that communicates aorta to the RV.  5. Tricuspid valve regurgitation is mild-moderate.  6. Mild to moderate tricuspid stenosis.  _______________  2D ECHO 02/04/19 IMPRESSIONS  1. Left ventricular ejection fraction, by visual estimation, is 60 to 65%. The left ventricle has normal function. There is mildly increased left ventricular hypertrophy.  2. Global right ventricle has normal systolic function.The right ventricular size is normal. No increase in right ventricular wall thickness.  3. Left atrial size was normal.  4. Right atrial size was normal.  5. The mitral valve is normal in structure. Mild mitral valve regurgitation. Mild mitral stenosis.  6. The tricuspid valve is grossly normal. Tricuspid valve regurgitation mild-moderate.  7. Aortic valve regurgitation is not visualized.  8. The pulmonic valve was grossly normal. Pulmonic valve regurgitation is not visualized.  9. Severely elevated  pulmonary artery systolic pressure. 10. The atrial septum is grossly normal.  ASSESSMENT & PLAN:   Severe AS s/p TAVR  c/b VSD: she is doing well with improvement in her shortness of breath. Echo from today does not mention a VSD but, I reviewed echo with Dr. Burt Knack and VSD still present but stable. Would recommend continued follow up with serial annual echos under the care of her primary cardiologist, Dr. Bettina Gavia.   Chronic diastolic CHF: she is currently Rx'd Lasix 20mg  /40mg  alternating every other day and then 20mg  on the weekends, but she goes back to 20mg  qod and then alternating 20 and 40mg  based on her symptoms. I have asked her to continue sliding scale lasix as this seems to be working for her. I will check labs today to monitor renal function and electrolytes.   HTN: BP well controlled   CKD stage III: check bmet today   Medication Adjustments/Labs and Tests Ordered: Current medicines are reviewed at length with the patient today.  Concerns regarding medicines are outlined above.  Medication changes, Labs and Tests ordered today are listed in the Patient Instructions below. Patient Instructions  Medication Instructions:  Your provider recommends that you continue on your current medications as directed. Please refer to the Current Medication list given to you today.    Labwork: TODAY: BMET  Follow-Up: You have an appointment with Dr. Bettina Gavia on 10/06/2019 at 11:20AM.    Signed, Angelena Form, PA-C  08/13/2019 9:23 AM    [address], [place], [place]  [address] Phone: [phone]; Fax: [phone]

## 2019-08-12 ENCOUNTER — Other Ambulatory Visit (HOSPITAL_COMMUNITY): Payer: Self-pay | Admitting: Physician Assistant

## 2019-08-12 ENCOUNTER — Ambulatory Visit (HOSPITAL_COMMUNITY): Payer: Medicare Other | Attending: Cardiovascular Disease

## 2019-08-12 ENCOUNTER — Ambulatory Visit (INDEPENDENT_AMBULATORY_CARE_PROVIDER_SITE_OTHER): Payer: Medicare Other | Admitting: Physician Assistant

## 2019-08-12 ENCOUNTER — Other Ambulatory Visit: Payer: Self-pay

## 2019-08-12 ENCOUNTER — Encounter: Payer: Self-pay | Admitting: Physician Assistant

## 2019-08-12 ENCOUNTER — Encounter (INDEPENDENT_AMBULATORY_CARE_PROVIDER_SITE_OTHER): Payer: Self-pay

## 2019-08-12 VITALS — BP 128/56 | HR 76 | Ht 60.0 in | Wt 121.1 lb

## 2019-08-12 DIAGNOSIS — Z952 Presence of prosthetic heart valve: Secondary | ICD-10-CM

## 2019-08-12 DIAGNOSIS — Q210 Ventricular septal defect: Secondary | ICD-10-CM

## 2019-08-12 DIAGNOSIS — N183 Chronic kidney disease, stage 3 unspecified: Secondary | ICD-10-CM

## 2019-08-12 DIAGNOSIS — I130 Hypertensive heart and chronic kidney disease with heart failure and stage 1 through stage 4 chronic kidney disease, or unspecified chronic kidney disease: Secondary | ICD-10-CM | POA: Diagnosis not present

## 2019-08-12 NOTE — Patient Instructions (Addendum)
Medication Instructions:  Your provider recommends that you continue on your current medications as directed. Please refer to the Current Medication list given to you today.    Labwork: TODAY: BMET  Follow-Up: You have an appointment with Dr. Bettina Gavia on 10/06/2019 at 11:20AM.

## 2019-08-13 LAB — BASIC METABOLIC PANEL
BUN/Creatinine Ratio: 25 (ref 12–28)
BUN: 32 mg/dL (ref 10–36)
CO2: 25 mmol/L (ref 20–29)
Calcium: 10 mg/dL (ref 8.7–10.3)
Chloride: 101 mmol/L (ref 96–106)
Creatinine, Ser: 1.27 mg/dL — ABNORMAL HIGH (ref 0.57–1.00)
GFR calc Af Amer: 43 mL/min/{1.73_m2} — ABNORMAL LOW (ref >59)
GFR calc non Af Amer: 37 mL/min/{1.73_m2} — ABNORMAL LOW (ref >59)
Glucose: 91 mg/dL (ref 65–99)
Potassium: 4.6 mmol/L (ref 3.5–5.2)
Sodium: 140 mmol/L (ref 134–144)

## 2019-08-20 DIAGNOSIS — L570 Actinic keratosis: Secondary | ICD-10-CM | POA: Diagnosis not present

## 2019-08-20 DIAGNOSIS — L578 Other skin changes due to chronic exposure to nonionizing radiation: Secondary | ICD-10-CM | POA: Diagnosis not present

## 2019-08-20 DIAGNOSIS — L821 Other seborrheic keratosis: Secondary | ICD-10-CM | POA: Diagnosis not present

## 2019-08-20 DIAGNOSIS — L820 Inflamed seborrheic keratosis: Secondary | ICD-10-CM | POA: Diagnosis not present

## 2019-09-03 DIAGNOSIS — H01024 Squamous blepharitis left upper eyelid: Secondary | ICD-10-CM | POA: Diagnosis not present

## 2019-09-03 DIAGNOSIS — H04123 Dry eye syndrome of bilateral lacrimal glands: Secondary | ICD-10-CM | POA: Diagnosis not present

## 2019-09-03 DIAGNOSIS — H01021 Squamous blepharitis right upper eyelid: Secondary | ICD-10-CM | POA: Diagnosis not present

## 2019-09-22 ENCOUNTER — Ambulatory Visit (INDEPENDENT_AMBULATORY_CARE_PROVIDER_SITE_OTHER): Payer: Medicare Other | Admitting: *Deleted

## 2019-09-22 DIAGNOSIS — I5032 Chronic diastolic (congestive) heart failure: Secondary | ICD-10-CM | POA: Diagnosis not present

## 2019-09-22 LAB — CUP PACEART REMOTE DEVICE CHECK
Battery Remaining Longevity: 128 mo
Battery Remaining Percentage: 95.5 %
Battery Voltage: 3.01 V
Brady Statistic AP VP Percent: 1 %
Brady Statistic AP VS Percent: 1.8 %
Brady Statistic AS VP Percent: 1 %
Brady Statistic AS VS Percent: 98 %
Brady Statistic RA Percent Paced: 1.7 %
Brady Statistic RV Percent Paced: 1 %
Date Time Interrogation Session: 20201221020029
Implantable Lead Implant Date: 20190620
Implantable Lead Implant Date: 20190620
Implantable Lead Location: 753859
Implantable Lead Location: 753860
Implantable Pulse Generator Implant Date: 20190620
Lead Channel Impedance Value: 350 Ohm
Lead Channel Impedance Value: 440 Ohm
Lead Channel Pacing Threshold Amplitude: 0.75 V
Lead Channel Pacing Threshold Amplitude: 0.75 V
Lead Channel Pacing Threshold Pulse Width: 0.5 ms
Lead Channel Pacing Threshold Pulse Width: 0.5 ms
Lead Channel Sensing Intrinsic Amplitude: 4.6 mV
Lead Channel Sensing Intrinsic Amplitude: 6.5 mV
Lead Channel Setting Pacing Amplitude: 1 V
Lead Channel Setting Pacing Amplitude: 2 V
Lead Channel Setting Pacing Pulse Width: 0.5 ms
Lead Channel Setting Sensing Sensitivity: 2 mV
Pulse Gen Model: 2272
Pulse Gen Serial Number: 9035930

## 2019-10-05 NOTE — Progress Notes (Signed)
Cardiology Office Note:    Date:  10/06/2019   ID:  [name], DOB 1926-02-24, MRN [id number]  PCP:  Angelina Sheriff, MD  Cardiologist:  Shirlee More, MD    Referring MD: Angelina Sheriff, MD    ASSESSMENT:    1. S/P TAVR (transcatheter aortic valve replacement)   2. VSD (ventricular septal defect)   3. Chronic diastolic heart failure ([place])   4. 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 Stage 3 chronic kidney disease, unspecified whether stage 3a or 3b CKD   6. S/P placement of cardiac pacemaker   7. Nonrheumatic tricuspid valve regurgitation    PLAN:    In order of problems listed above:  1. Her TAVR was successful but unfortunately as a consequence of the VSD and the pacemaker lead she has developed right heart failure with severe tricuspid regurgitation and pulmonary hypertension.  I will discuss with Dr. Burt Knack ask him to look at that echocardiogram decide if she needs further evaluation particularly 3-dimensional echo or TEE to define if we should consider plication of her tricuspid regurgitation or even intervention for her VSD. 2. Heart failure is decompensated stop her calcium channel blocker increase her diuretic paretic recheck renal function proBNP level with CKD 3. Normal pacemaker function rarely ventricularly paced upgrade to biventricular would not be of help 4. She has severe tricuspid regurgitation 5. Echo reviewed both the profile the TR jet and the color Doppler and may benefit from 3-dimensional echo and/or TEE and consideration of tricuspid valve plication or correction of her VSD percutaneously. 6.    Next appointment: 4 weeks   Medication Adjustments/Labs and Tests Ordered: Current medicines are reviewed at length with the patient today.  Concerns regarding medicines are outlined above.  No orders of the defined types were placed in this encounter.  No orders of the defined types  were placed in this encounter.   Chief Complaint  Patient presents with  . Follow-up    after TAVR  . Congestive Heart Failure    History of Present Illness:    ASHLYNE RIDPATH is a 86 y.o. female with a hx of TAVR complicated by heart block necessitating permanent pacemaker small traumatic VSD not hemodynamically significant and heart failure with stage 3 CKD    last seen 05/08/2019 also followed in structural disease program and was seen there in November 2020. Compliance with diet, lifestyle and medications: Yes  She has no memory been called after echocardiogram I reviewed the findings with her.  She is not doing well she complains of fatigue and exercise intolerance and has to stop and rest with activities like bathing.  She is also limited by chronic severe back pain.  Her diuretic dose was decreased she has more peripheral edema she is short of breath ambulating in the hall and she has some orthopnea.  No chest pain palpitation or syncope.  Today when I see her she is taking a calcium channel blocker contributing to her edema I will discontinue I will increase her diuretic recheck renal function with CKD proBNP level discuss her case with Dr. Burt Knack.  Clinically she has right heart failure with severe tricuspid regurgitation and is also quite severe pulmonary hypertension I think this is a consequence of her VSD flow.  Follow-up echocardiogram 08/12/2019 showed a small VSD with left to right shunting present right ventricular size and function were normal ejection fraction 60  to 65% she did have severe pulmonary artery hypertension suggesting significant VSD shunt volume TAVR function was normal.  I independently reviewed that echocardiogram tricuspid regurgitation looks severe and there is severe pulmonary artery hypertension although no evidence of right ventricular dysfunction pressure or volume overload. Past Medical History:  Diagnosis Date  . Acute on chronic diastolic heart failure  ([place]) 03/18/2018  . Arthritis   . Bone spur of toe of left foot   . Chronic diastolic (congestive) heart failure ([place])   . Essential hypertension   . GERD (gastroesophageal reflux disease)   . HLD (hyperlipidemia)   . Hyperlipidemia 11/25/2016  . Pancreatic mass    a. benign appearing but needs f/u, noted on pre TAVR CTs  . 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 Severe aortic stenosis    a. 03/2018: s/p TAVR by Burt Knack and Dr. Roxy Manns  . Stage 3 chronic kidney disease   . TIA (transient ischemic attack)     a. 1992  . VSD (ventricular septal defect)     Past Surgical History:  Procedure Laterality Date  . ABDOMINAL HYSTERECTOMY    . APPENDECTOMY    . HERNIA REPAIR    . INTRAOPERATIVE TRANSTHORACIC ECHOCARDIOGRAM N/A 03/18/2018   Procedure: INTRAOPERATIVE TRANSTHORACIC ECHOCARDIOGRAM;  Surgeon: Sherren Mocha, MD;  Location: [facility];  Service: Open Heart Surgery;  Laterality: N/A;  . PACEMAKER IMPLANT N/A 03/20/2018   Procedure: PACEMAKER IMPLANT;  Surgeon: Constance Haw, MD;  Location: [place] [facility];  Service: Cardiovascular;  Laterality: N/A;  . RIGHT HEART CATH N/A 03/20/2018   Procedure: RIGHT HEART CATH;  Surgeon: Sherren Mocha, MD;  Location: [place] [facility];  Service: Cardiovascular;  Laterality: N/A;  . RIGHT/LEFT HEART CATH AND CORONARY ANGIOGRAPHY N/A 02/06/2018   Procedure: RIGHT/LEFT HEART CATH AND CORONARY ANGIOGRAPHY;  Surgeon: Sherren Mocha, MD;  Location: [place] [facility];  Service: Cardiovascular;  Laterality: N/A;  . TONSILLECTOMY    . TRANSCATHETER AORTIC VALVE REPLACEMENT, TRANSFEMORAL N/A 03/18/2018   Procedure: TRANSCATHETER AORTIC VALVE REPLACEMENT, TRANSFEMORAL;  Surgeon: Sherren Mocha, MD;  Location: [facility];  Service: Open Heart Surgery;  Laterality: N/A;  . WISDOM TOOTH EXTRACTION       Current Medications: Current Meds  Medication Sig  . acetaminophen (TYLENOL) 650 MG CR tablet Take 650-1,300 mg by mouth every 8 (eight) hours as needed for pain.  Marland Kitchen amLODipine (NORVASC) 5 MG tablet Take 1 tablet (5 mg total) by mouth daily.  Marland Kitchen amoxicillin (AMOXIL) 500 MG tablet Take 4 tablets (2,000 mg total) by mouth as directed. 1 hour prior to dental work including cleanings  . aspirin EC 81 MG tablet Take 81 mg by mouth daily.   Marland Kitchen estradiol (ESTRACE) 1 MG tablet Take 0.5 mg by mouth daily.  . famotidine (PEPCID) 20 MG tablet Take 20 mg by mouth as needed for heartburn or indigestion.  . furosemide (LASIX) 20 MG tablet Take 20 mg by mouth as directed. Take 1 tablet alternating with 2 tablets, and 1 tablet on Saturday and Sunday  . labetalol (NORMODYNE) 100 MG tablet Take 100 mg by mouth 2 (two) times daily.   . Liniments (BLUE-EMU SUPER STRENGTH EX) Apply 1 application topically 3 (three) times daily as needed (for neck pain.).  Marland Kitchen Omega-3 Fatty Acids (FISH OIL) 1000 MG CAPS Take 1,000 mg by  mouth daily.  Vladimir Faster Glycol-Propyl Glycol (SYSTANE OP) Place 1 drop into both eyes daily as needed (for dry eyes).  . pravastatin (PRAVACHOL) 20 MG tablet Take 20 mg by mouth at bedtime.   . Probiotic Product (CVS PROBIOTIC) CHEW Chew 2 each by mouth daily after supper.   . vitamin B-12 (CYANOCOBALAMIN) 100 MCG tablet Take 100 mcg by mouth daily.  . Vitamin D, Ergocalciferol, (DRISDOL) 1.25 MG (50000 UT) CAPS capsule TAKE 1 CAPSULE BY MOUTH 2 TIMES A WEEK     Allergies:   Levofloxacin, Azithromycin, Clarithromycin, and Latex   Social History   Socioeconomic History  . Marital status: Married    Spouse name: Not on file  . Number of children: Not on file  . Years of education: Not on file  . Highest education level: Not on file  Occupational History  . Not on file  Tobacco Use  . Smoking status: Never Smoker  . Smokeless tobacco: Never Used  Substance and Sexual Activity  .  Alcohol use: No  . Drug use: No  . Sexual activity: Not on file  Other Topics Concern  . Not on file  Social History Narrative  . Not on file   Social Determinants of Health   Financial Resource Strain:   . Difficulty of Paying Living Expenses: Not on file  Food Insecurity:   . Worried About Charity fundraiser in the Last Year: Not on file  . Ran Out of Food in the Last Year: Not on file  Transportation Needs:   . Lack of Transportation (Medical): Not on file  . Lack of Transportation (Non-Medical): Not on file  Physical Activity:   . Days of Exercise per Week: Not on file  . Minutes of Exercise per Session: Not on file  Stress:   . Feeling of Stress : Not on file  Social Connections:   . Frequency of Communication with Friends and Family: Not on file  . Frequency of Social Gatherings with Friends and Family: Not on file  . Attends Religious Services: Not on file  . Active Member of Clubs or Organizations: Not on file  . Attends Archivist Meetings: Not on file  . Marital Status: Not on file     Family History: The patient's family history includes Congenital heart disease in her brother; Heart attack in her brother; Heart disease in her mother; Uterine cancer in her sister. ROS:   Please see the history of present illness.    All other systems reviewed and are negative.  EKGs/Labs/Other Studies Reviewed:    The following studies were reviewed today:  Pacemaker device check download 09/21/2019 with normal function and parameters.  Note she is rarely ventricularly paced.  Recent Labs: 05/08/2019: NT-Pro BNP 1,201 08/12/2019: BUN 32; Creatinine, Ser 1.27; Potassium 4.6; Sodium 140  Recent Lipid Panel    Component Value Date/Time   CHOL 229 (H) 02/06/2019 1140   TRIG 164 (H) 02/06/2019 1140   HDL 90 02/06/2019 1140   CHOLHDL 2.5 02/06/2019 1140   LDLCALC 106 (H) 02/06/2019 1140    Physical Exam:    VS:  BP (!) 148/68 (BP Location: Left Arm, Patient  Position: Sitting, Cuff Size: Normal)   Pulse 68   Ht 5' (1.524 m)   Wt 121 lb (54.9 kg)   SpO2 98%   BMI 23.63 kg/m     Wt Readings from Last 3 Encounters:  10/06/19 121 lb (54.9 kg)  08/12/19 121 lb 1.9 oz (  54.9 kg)  05/08/19 116 lb (52.6 kg)     GEN: Very frail appearing well nourished, well developed in no acute distress HEENT: Normal NECK: She has moderate neck vein distention with hepatojugular reflux JVD; No carotid bruits LYMPHATICS: No lymphadenopathy CARDIAC: Holosystolic murmur 3 of 6 throughout the precordium heard most of the left sternal border radiates to the aortic area does not have respiratory variation combination of VSD and TR.  P2 is accentuated no aortic regurgitation S1 is soft.  RRR, no murmurs, rubs, gallops RESPIRATORY:  Clear to auscultation without rales, wheezing or rhonchi  ABDOMEN: Soft, non-tender, non-distended MUSCULOSKELETAL: 3+ bilateral pitting ankle to knee edema edema; No deformity  SKIN: Warm and dry NEUROLOGIC:  Alert and oriented x 3 PSYCHIATRIC:  Normal affect    Signed, Shirlee More, MD  10/06/2019 11:43 AM    [place]

## 2019-10-06 ENCOUNTER — Ambulatory Visit (INDEPENDENT_AMBULATORY_CARE_PROVIDER_SITE_OTHER): Payer: Medicare Other | Admitting: Cardiology

## 2019-10-06 ENCOUNTER — Other Ambulatory Visit: Payer: Self-pay

## 2019-10-06 ENCOUNTER — Encounter: Payer: Self-pay | Admitting: Cardiology

## 2019-10-06 VITALS — BP 148/68 | HR 68 | Ht 60.0 in | Wt 121.0 lb

## 2019-10-06 DIAGNOSIS — I5032 Chronic diastolic (congestive) heart failure: Secondary | ICD-10-CM

## 2019-10-06 DIAGNOSIS — I130 Hypertensive heart and chronic kidney disease with heart failure and stage 1 through stage 4 chronic kidney disease, or unspecified chronic kidney disease: Secondary | ICD-10-CM | POA: Diagnosis not present

## 2019-10-06 DIAGNOSIS — Z952 Presence of prosthetic heart valve: Secondary | ICD-10-CM

## 2019-10-06 DIAGNOSIS — Q210 Ventricular septal defect: Secondary | ICD-10-CM | POA: Diagnosis not present

## 2019-10-06 DIAGNOSIS — I361 Nonrheumatic tricuspid (valve) insufficiency: Secondary | ICD-10-CM

## 2019-10-06 DIAGNOSIS — Z950 Presence of cardiac pacemaker: Secondary | ICD-10-CM

## 2019-10-06 DIAGNOSIS — N183 Chronic kidney disease, stage 3 unspecified: Secondary | ICD-10-CM

## 2019-10-06 MED ORDER — FUROSEMIDE 20 MG PO TABS: 20.0000 mg | ORAL_TABLET | Freq: Two times a day (BID) | ORAL | 3 refills | Status: DC

## 2019-10-06 NOTE — Patient Instructions (Signed)
Medication Instructions:  1.) Discontinue Amlodipine   2) Increase Lasix to 20 mg twice a day   *If you need a refill on your cardiac medications before your next appointment, please call your pharmacy*  Lab Work: BMET & PRO BNP  If you have labs (blood work) drawn today and your tests are completely normal, you will receive your results only by: Marland Kitchen MyChart Message (if you have MyChart) OR . A paper copy in the mail If you have any lab test that is abnormal or we need to change your treatment, we will call you to review the results.  Testing/Procedures: None ordered   Follow-Up: At [facility], you and your health needs are our priority.  As part of our continuing mission to provide you with exceptional heart care, we have created designated Provider Care Teams.  These Care Teams include your primary Cardiologist (physician) and Advanced Practice Providers (APPs -  Physician Assistants and Nurse Practitioners) who all work together to provide you with the care you need, when you need it.  Your next appointment:   4 week(s)  The format for your next appointment:   In Person  Provider:   Shirlee More, MD  Other Instructions Call [facility] for vaccine

## 2019-10-06 NOTE — Addendum Note (Signed)
Addended by: Mendel Ryder on: 10/06/2019 11:54 AM   Modules accepted: Orders

## 2019-10-07 LAB — BASIC METABOLIC PANEL
BUN/Creatinine Ratio: 17 (ref 12–28)
BUN: 22 mg/dL (ref 10–36)
CO2: 24 mmol/L (ref 20–29)
Calcium: 9.4 mg/dL (ref 8.7–10.3)
Chloride: 104 mmol/L (ref 96–106)
Creatinine, Ser: 1.3 mg/dL — ABNORMAL HIGH (ref 0.57–1.00)
GFR calc Af Amer: 41 mL/min/{1.73_m2} — ABNORMAL LOW (ref >59)
GFR calc non Af Amer: 36 mL/min/{1.73_m2} — ABNORMAL LOW (ref >59)
Glucose: 89 mg/dL (ref 65–99)
Potassium: 4.3 mmol/L (ref 3.5–5.2)
Sodium: 142 mmol/L (ref 134–144)

## 2019-10-07 LAB — PRO B NATRIURETIC PEPTIDE: NT-Pro BNP: 912 pg/mL — ABNORMAL HIGH (ref 0–738)

## 2019-11-02 NOTE — Progress Notes (Signed)
Cardiology Office Note:    Date:  11/03/2019   ID:  Kaitlin Villarreal, DOB 1926/07/11, MRN [id number]  PCP:  Kaitlin Sheriff, MD  Cardiologist:  Kaitlin More, MD    Referring MD: Kaitlin Sheriff, MD    ASSESSMENT:    1. Episodic lightheadedness   2. S/P TAVR (transcatheter aortic valve replacement)   3. VSD (ventricular septal defect)   4. Chronic diastolic heart failure ([place])   5. 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 S/P placement of cardiac pacemaker    PLAN:    In order of problems listed above:  1. Likely due to cerebral hypoperfusion low blood pressure and age group see changes 2. Is reviewed with structural heart at this time no recommendations for intervention 3. Failure is improved continue her current diuretic 4. Quick evaluation with iEKG and I sent a secure chat to device clinic to do a download.   Next appointment: 1 week   Medication Adjustments/Labs and Tests Ordered: Current medicines are reviewed at length with the patient today.  Concerns regarding medicines are outlined above.  No orders of the defined types were placed in this encounter.  No orders of the defined types were placed in this encounter.   Chief Complaint  Patient presents with  . Follow-up  . Congestive Heart Failure    History of Present Illness:    Kaitlin Villarreal is a 86 y.o. female with a hx of TAVR complicated by heart block necessitating permanent pacemaker small traumatic VSD not hemodynamically significant and heart failure with stage 3 CKD     last seen 10/06/2019.  After last visit Dr. Burt Villarreal and I reviewed her echocardiogram together.  She does not have severe tricuspid regurgitation or pulmonary artery hypertension that was overlap of the VSD flow. Compliance with diet, lifestyle and medications: Yes  Since increasing diuretics Kaitlin Villarreal is not felt as well she has had a couple episodes where she felt as she  did before the pacemaker she is having exertional and orthostatic lightheadedness no syncope.  Her blood pressure today is low with a diastolic 50 I think she is having cerebral hypoperfusion she takes a profound antihypertensive drug labetalol will discontinue check an EKG in the office to be sure she has normal pacemaker function and I sent a secure chat to the device clinic for them to do a download from a distance on her pacemaker.  She is at risk for electrolyte abnormality recheck BMP proBNP and I will see back in the office in 1 week.  I think she will have a prompt improvement.  Her heart failure is better she is not having edema or shortness of breath. Past Medical History:  Diagnosis Date  . Acute on chronic diastolic heart failure ([place]) 03/18/2018  . Arthritis   . Bone spur of toe of left foot   . Chronic diastolic (congestive) heart failure ([place])   . Essential hypertension   . GERD (gastroesophageal reflux disease)   . HLD (hyperlipidemia)   . Hyperlipidemia 11/25/2016  . Pancreatic mass    a. benign appearing but needs f/u, noted on pre TAVR CTs  . 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 Severe aortic stenosis    a. 03/2018: s/p TAVR  by Kaitlin Villarreal and Dr. Roxy Manns  . Stage 3 chronic kidney disease   . TIA (transient ischemic attack)     a. 1992  . VSD (ventricular septal defect)     Past Surgical History:  Procedure Laterality Date  . ABDOMINAL HYSTERECTOMY    . APPENDECTOMY    . HERNIA REPAIR    . INTRAOPERATIVE TRANSTHORACIC ECHOCARDIOGRAM N/A 03/18/2018   Procedure: INTRAOPERATIVE TRANSTHORACIC ECHOCARDIOGRAM;  Surgeon: Sherren Mocha, MD;  Location: [place];  Service: Open Heart Surgery;  Laterality: N/A;  . PACEMAKER IMPLANT N/A 03/20/2018   Procedure: PACEMAKER IMPLANT;  Surgeon: Constance Haw, MD;   Location: [place] [facility];  Service: Cardiovascular;  Laterality: N/A;  . RIGHT HEART CATH N/A 03/20/2018   Procedure: RIGHT HEART CATH;  Surgeon: Sherren Mocha, MD;  Location: [place] [facility];  Service: Cardiovascular;  Laterality: N/A;  . RIGHT/LEFT HEART CATH AND CORONARY ANGIOGRAPHY N/A 02/06/2018   Procedure: RIGHT/LEFT HEART CATH AND CORONARY ANGIOGRAPHY;  Surgeon: Sherren Mocha, MD;  Location: [place] [facility];  Service: Cardiovascular;  Laterality: N/A;  . TONSILLECTOMY    . TRANSCATHETER AORTIC VALVE REPLACEMENT, TRANSFEMORAL N/A 03/18/2018   Procedure: TRANSCATHETER AORTIC VALVE REPLACEMENT, TRANSFEMORAL;  Surgeon: Sherren Mocha, MD;  Location: [place];  Service: Open Heart Surgery;  Laterality: N/A;  . WISDOM TOOTH EXTRACTION      Current Medications: Current Meds  Medication Sig  . acetaminophen (TYLENOL) 650 MG CR tablet Take 650-1,300 mg by mouth every 8 (eight) hours as needed for pain.  Marland Kitchen amoxicillin (AMOXIL) 500 MG tablet Take 4 tablets (2,000 mg total) by mouth as directed. 1 hour prior to dental work including cleanings  . aspirin EC 81 MG tablet Take 81 mg by mouth daily.   Marland Kitchen estradiol (ESTRACE) 1 MG tablet Take 0.5 mg by mouth daily.  . famotidine (PEPCID) 20 MG tablet Take 20 mg by mouth as needed for heartburn or indigestion.  . furosemide (LASIX) 20 MG tablet Take 1 tablet (20 mg total) by mouth 2 (two) times daily. Take 1 tablet alternating with 2 tablets, and 1 tablet on Saturday and Sunday  . labetalol (NORMODYNE) 100 MG tablet Take 100 mg by mouth 2 (two) times daily.   . Liniments (BLUE-EMU SUPER STRENGTH EX) Apply 1 application topically 3 (three) times daily as needed (for neck pain.).  Marland Kitchen Omega-3 Fatty Acids (FISH OIL) 1000 MG CAPS Take 1,000 mg by mouth daily.  Vladimir Faster Glycol-Propyl Glycol (SYSTANE OP) Place 1 drop into both eyes daily as needed (for dry eyes).  . pravastatin (PRAVACHOL) 20 MG tablet Take 20 mg by mouth at bedtime.   . Probiotic  Product (CVS PROBIOTIC) CHEW Chew 2 each by mouth daily after supper.   . vitamin B-12 (CYANOCOBALAMIN) 100 MCG tablet Take 100 mcg by mouth daily.  . Vitamin D, Ergocalciferol, (DRISDOL) 1.25 MG (50000 UT) CAPS capsule TAKE 1 CAPSULE BY MOUTH 2 TIMES A WEEK     Allergies:   Levofloxacin, Azithromycin, Clarithromycin, and Latex   Social History   Socioeconomic History  . Marital status: Married    Spouse name: Not on file  . Number of children: Not on file  . Years of education: Not on file  . Highest education level: Not on file  Occupational History  . Not on file  Tobacco Use  . Smoking status: Never Smoker  . Smokeless tobacco: Never Used  Substance and Sexual Activity  . Alcohol use: No  . Drug use: No  .  Sexual activity: Not on file  Other Topics Concern  . Not on file  Social History Narrative  . Not on file   Social Determinants of Health   Financial Resource Strain:   . Difficulty of Paying Living Expenses: Not on file  Food Insecurity:   . Worried About Charity fundraiser in the Last Year: Not on file  . Ran Out of Food in the Last Year: Not on file  Transportation Needs:   . Lack of Transportation (Medical): Not on file  . Lack of Transportation (Non-Medical): Not on file  Physical Activity:   . Days of Exercise per Week: Not on file  . Minutes of Exercise per Session: Not on file  Stress:   . Feeling of Stress : Not on file  Social Connections:   . Frequency of Communication with Friends and Family: Not on file  . Frequency of Social Gatherings with Friends and Family: Not on file  . Attends Religious Services: Not on file  . Active Member of Clubs or Organizations: Not on file  . Attends Archivist Meetings: Not on file  . Marital Status: Not on file     Family History: The patient's family history includes Congenital heart disease in her brother; Heart attack in her brother; Heart disease in her mother; Uterine cancer in her sister. ROS:    Please see the history of present illness.    All other systems reviewed and are negative.  EKGs/Labs/Other Studies Reviewed:    The following studies were reviewed today:  EKG:  EKG ordered today and personally reviewed.  The ekg ordered today demonstrates sinus rhythm normal  Recent Labs: 10/06/2019: BUN 22; Creatinine, Ser 1.30; NT-Pro BNP 912; Potassium 4.3; Sodium 142  Recent Lipid Panel    Component Value Date/Time   CHOL 229 (H) 02/06/2019 1140   TRIG 164 (H) 02/06/2019 1140   HDL 90 02/06/2019 1140   CHOLHDL 2.5 02/06/2019 1140   LDLCALC 106 (H) 02/06/2019 1140    Physical Exam:    VS:  BP (!) 134/54   Pulse 69   Ht 5' (1.524 m)   Wt 120 lb (54.4 kg)   SpO2 99%   BMI 23.44 kg/m     Wt Readings from Last 3 Encounters:  11/03/19 120 lb (54.4 kg)  10/06/19 121 lb (54.9 kg)  08/12/19 121 lb 1.9 oz (54.9 kg)    BP by me 122/50 sitting 120/50 standing  GEN:  Well nourished, well developed in no acute distress HEENT: Normal NECK: No JVD; No carotid bruits LYMPHATICS: No lymphadenopathy CARDIAC: RRR, 2/6 to 3/6 murmur of VSD throughout the precordium, rubs, gallops RESPIRATORY:  Clear to auscultation without rales, wheezing or rhonchi  ABDOMEN: Soft, non-tender, non-distended MUSCULOSKELETAL:  No edema; No deformity  SKIN: Warm and dry NEUROLOGIC:  Alert and oriented x 3 PSYCHIATRIC:  Normal affect    Signed, Kaitlin More, MD  11/03/2019 11:31 AM    [place]

## 2019-11-03 ENCOUNTER — Telehealth: Payer: Self-pay

## 2019-11-03 ENCOUNTER — Encounter: Payer: Self-pay | Admitting: Cardiology

## 2019-11-03 ENCOUNTER — Other Ambulatory Visit: Payer: Self-pay

## 2019-11-03 ENCOUNTER — Ambulatory Visit (INDEPENDENT_AMBULATORY_CARE_PROVIDER_SITE_OTHER): Payer: Medicare Other | Admitting: Cardiology

## 2019-11-03 VITALS — BP 134/54 | HR 69 | Ht 60.0 in | Wt 120.0 lb

## 2019-11-03 DIAGNOSIS — N183 Chronic kidney disease, stage 3 unspecified: Secondary | ICD-10-CM

## 2019-11-03 DIAGNOSIS — Z952 Presence of prosthetic heart valve: Secondary | ICD-10-CM | POA: Diagnosis not present

## 2019-11-03 DIAGNOSIS — Z950 Presence of cardiac pacemaker: Secondary | ICD-10-CM

## 2019-11-03 DIAGNOSIS — I5032 Chronic diastolic (congestive) heart failure: Secondary | ICD-10-CM | POA: Diagnosis not present

## 2019-11-03 DIAGNOSIS — Q210 Ventricular septal defect: Secondary | ICD-10-CM

## 2019-11-03 DIAGNOSIS — R42 Dizziness and giddiness: Secondary | ICD-10-CM

## 2019-11-03 DIAGNOSIS — I130 Hypertensive heart and chronic kidney disease with heart failure and stage 1 through stage 4 chronic kidney disease, or unspecified chronic kidney disease: Secondary | ICD-10-CM

## 2019-11-03 MED ORDER — METOPROLOL TARTRATE 50 MG PO TABS: 50.0000 mg | ORAL_TABLET | Freq: Two times a day (BID) | ORAL | 3 refills | Status: DC

## 2019-11-03 NOTE — Telephone Encounter (Signed)
LMOVM for pt to call me back to get help sending a transmission per Dr. Bettina Gavia request.

## 2019-11-03 NOTE — Patient Instructions (Signed)
Medication Instructions:  Your physician has recommended you make the following change in your medication:   STOP taking labetolol  START taking metoprolol 25 mg (1 tablet) twice daily *If you need a refill on your cardiac medications before your next appointment, please call your pharmacy*  Lab Work: Your physician recommends that you have a BMP and BNP drawn today  If you have labs (blood work) drawn today and your tests are completely normal, you will receive your results only by: Marland Kitchen MyChart Message (if you have MyChart) OR . A paper copy in the mail If you have any lab test that is abnormal or we need to change your treatment, we will call you to review the results.  Testing/Procedures: You had an EKG performed today  Follow-Up: At [facility], you and your health needs are our priority.  As part of our continuing mission to provide you with exceptional heart care, we have created designated Provider Care Teams.  These Care Teams include your primary Cardiologist (physician) and Advanced Practice Providers (APPs -  Physician Assistants and Nurse Practitioners) who all work together to provide you with the care you need, when you need it.  Your next appointment:   1 week(s)  The format for your next appointment:   In Person  Provider:   Shirlee More, MD  Other Instructions Metoprolol Tablets What is this medicine? METOPROLOL (me TOE proe lole) is a beta blocker. It decreases the amount of work your heart has to do and helps your heart beat regularly. It is used to treat high blood pressure and/or prevent chest pain (also called angina). It is also used after a heart attack to prevent a second one. This medicine may be used for other purposes; ask your health care provider or pharmacist if you have questions. COMMON BRAND NAME(S): Lopressor What should I tell my health care provider before I take this medicine? They need to know if you have any of these  conditions:  diabetes  heart or vessel disease like slow heart rate, worsening heart failure, heart block, sick sinus syndrome or Raynaud's disease  kidney disease  liver disease  lung or breathing disease, like asthma or emphysema  pheochromocytoma  thyroid disease  an unusual or allergic reaction to metoprolol, other beta-blockers, medicines, foods, dyes, or preservatives  pregnant or trying to get pregnant  breast-feeding How should I use this medicine? Take this drug by mouth with water. Take it as directed on the prescription label at the same time every day. You can take it with or without food. You should always take it the same way. Keep taking it unless your health care provider tells you to stop. Talk to your health care provider about the use of this drug in children. Special care may be needed. Overdosage: If you think you have taken too much of this medicine contact a poison control center or emergency room at once. NOTE: This medicine is only for you. Do not share this medicine with others. What if I miss a dose? If you miss a dose, take it as soon as you can. If it is almost time for your next dose, take only that dose. Do not take double or extra doses. What may interact with this medicine? This medicine may interact with the following medications:  certain medicines for blood pressure, heart disease, irregular heart beat  certain medicines for depression like monoamine oxidase (MAO) inhibitors, fluoxetine, or paroxetine  clonidine  dobutamine  epinephrine  isoproterenol  reserpine This list may not describe all possible interactions. Give your health care provider a list of all the medicines, herbs, non-prescription drugs, or dietary supplements you use. Also tell them if you smoke, drink alcohol, or use illegal drugs. Some items may interact with your medicine. What should I watch for while using this medicine? Visit your doctor or health care  professional for regular check ups. Contact your doctor right away if your symptoms worsen. Check your blood pressure and pulse rate regularly. Ask your health care professional what your blood pressure and pulse rate should be, and when you should contact them. You may get drowsy or dizzy. Do not drive, use machinery, or do anything that needs mental alertness until you know how this medicine affects you. Do not sit or stand up quickly, especially if you are an older patient. This reduces the risk of dizzy or fainting spells. Contact your doctor if these symptoms continue. Alcohol may interfere with the effect of this medicine. Avoid alcoholic drinks. This medicine may increase blood sugar. Ask your healthcare provider if changes in diet or medicines are needed if you have diabetes. What side effects may I notice from receiving this medicine? Side effects that you should report to your doctor or health care professional as soon as possible:  allergic reactions like skin rash, itching or hives  cold or numb hands or feet  depression  difficulty breathing  faint  fever with sore throat  irregular heartbeat, chest pain  rapid weight gain   signs and symptoms of high blood sugar such as being more thirsty or hungry or having to urinate more than normal. You may also feel very tired or have blurry vision.  swollen legs or ankles Side effects that usually do not require medical attention (report to your doctor or health care professional if they continue or are bothersome):  anxiety or nervousness  change in sex drive or performance  dry skin  headache  nightmares or trouble sleeping  short term memory loss  stomach upset or diarrhea This list may not describe all possible side effects. Call your doctor for medical advice about side effects. You may report side effects to FDA at 1-800-FDA-1088. Where should I keep my medicine? Keep out of the reach of children and pets. Store  at room temperature between 15 and 30 degrees C (59 and 86 degrees F). Protect from moisture. Keep the container tightly closed. Throw away any unused drug after the expiration date. NOTE: This sheet is a summary. It may not cover all possible information. If you have questions about this medicine, talk to your doctor, pharmacist, or health care provider.  2020 Elsevier/Gold Standard (2019-04-30 17:21:17)

## 2019-11-04 ENCOUNTER — Telehealth: Payer: Self-pay

## 2019-11-04 LAB — BASIC METABOLIC PANEL
BUN/Creatinine Ratio: 23 (ref 12–28)
BUN: 30 mg/dL (ref 10–36)
CO2: 24 mmol/L (ref 20–29)
Calcium: 9.5 mg/dL (ref 8.7–10.3)
Chloride: 102 mmol/L (ref 96–106)
Creatinine, Ser: 1.31 mg/dL — ABNORMAL HIGH (ref 0.57–1.00)
GFR calc Af Amer: 41 mL/min/{1.73_m2} — ABNORMAL LOW (ref >59)
GFR calc non Af Amer: 36 mL/min/{1.73_m2} — ABNORMAL LOW (ref >59)
Glucose: 90 mg/dL (ref 65–99)
Potassium: 4.9 mmol/L (ref 3.5–5.2)
Sodium: 142 mmol/L (ref 134–144)

## 2019-11-04 LAB — PRO B NATRIURETIC PEPTIDE: NT-Pro BNP: 1453 pg/mL — ABNORMAL HIGH (ref 0–738)

## 2019-11-04 NOTE — Telephone Encounter (Signed)
The patient has been notified of the result and verbalized understanding.  All questions (if any) were answered. Wilma Flavin, RN 11/04/2019 9:35 AM

## 2019-11-04 NOTE — Telephone Encounter (Signed)
-----   Message from Richardo Priest, MD sent at 11/04/2019  7:45 AM EST ----- Normal or stable result  Her blood pressure was low when I changed her medications yesterday

## 2019-11-05 NOTE — Telephone Encounter (Signed)
PPM transmission reviewed. Normal device function. Presenting rhythm AS/VS 90s. Battery and lead trends stable. 4 mode switches (<1%)--EGMs show brief AT, longest 10sec, most recent on 10/26/19. No high ventricular rates noted. AP 1.8%, VP <1%.  Routed to Dr. Bettina Gavia for review.

## 2019-11-05 NOTE — Telephone Encounter (Signed)
The pt returned my call. I let her know dr. Bettina Gavia asked me to get a transmission from her home remote monitor. I gave her verbal instructions. Transmission received. I told her I will let the nurse know we got the transmission and I will also let Dr. Bettina Gavia know as well. I told her someone will give her a call back after the transmission is reviewed.

## 2019-11-11 ENCOUNTER — Encounter: Payer: Self-pay | Admitting: Cardiology

## 2019-11-11 ENCOUNTER — Ambulatory Visit (INDEPENDENT_AMBULATORY_CARE_PROVIDER_SITE_OTHER): Payer: Medicare Other | Admitting: Cardiology

## 2019-11-11 ENCOUNTER — Other Ambulatory Visit: Payer: Self-pay

## 2019-11-11 VITALS — BP 128/60 | HR 74 | Temp 97.0°F | Ht 60.0 in | Wt 121.2 lb

## 2019-11-11 DIAGNOSIS — Z950 Presence of cardiac pacemaker: Secondary | ICD-10-CM

## 2019-11-11 DIAGNOSIS — R42 Dizziness and giddiness: Secondary | ICD-10-CM

## 2019-11-11 DIAGNOSIS — I10 Essential (primary) hypertension: Secondary | ICD-10-CM | POA: Diagnosis not present

## 2019-11-11 DIAGNOSIS — Z952 Presence of prosthetic heart valve: Secondary | ICD-10-CM | POA: Diagnosis not present

## 2019-11-11 NOTE — Patient Instructions (Signed)
Medication Instructions:  Your physician recommends that you continue on your current medications as directed. Please refer to the Current Medication list given to you today.  *If you need a refill on your cardiac medications before your next appointment, please call your pharmacy*  Lab Work: None If you have labs (blood work) drawn today and your tests are completely normal, you will receive your results only by: Marland Kitchen MyChart Message (if you have MyChart) OR . A paper copy in the mail If you have any lab test that is abnormal or we need to change your treatment, we will call you to review the results.  Testing/Procedures: None  Follow-Up: At [facility], you and your health needs are our priority.  As part of our continuing mission to provide you with exceptional heart care, we have created designated Provider Care Teams.  These Care Teams include your primary Cardiologist (physician) and Advanced Practice Providers (APPs -  Physician Assistants and Nurse Practitioners) who all work together to provide you with the care you need, when you need it.  Your next appointment:   1 month(s)  The format for your next appointment:   In Person  Provider:   Shirlee More, MD or Berniece Salines DO  Other Instructions You are being referred to Dr Curt Bears for your pacemaker.

## 2019-11-11 NOTE — Progress Notes (Signed)
Cardiology Office Note:    Date:  11/11/2019   ID:  Kaitlin Villarreal, DOB 06-30-1926, MRN [id number]  PCP:  Angelina Sheriff, MD  Cardiologist:  Shirlee More, MD  Electrophysiologist:  Constance Haw, MD   Referring MD: Angelina Sheriff, MD   Follow up visit for chronic diastolic heart failure  History of Present Illness:    Kaitlin Villarreal is a 86 y.o. female with a hx of chronic diastolic heart failure, tension, hyperlipidemia, status post TAVR, status post pacemaker placement with a st. Jude dual chamber RA/RV device , hypertension with chronic kidney disease and VSD.  The patient was last seen by Dr. Bettina Gavia on November 03, 2019 at that time she reported that she has been experiencing some exertional and orthostatic lightheadedness. At that time she reported that she feels exactly the way she did before she got her pacemaker. During her visit it was thought that she was experiencing cerebral hypoperfusion, labetalol was discontinued and EKG was performed and request to the device clinic for review of the patient's pacemaker function.  In the interim I reviewed her chart and the patient has not had a remote device check yet. Her upcoming device check is a March 2021. I reviewed the report from her 09/2019 remote monitoring: Lower rate 60, Mode DDD, upper rate 120 with mode switch set at 150. Battery life about 10 year. NO alarms noted, no mode switches noted.  The patient tells me that she is still experiencing intermittent lightheadedness along with shortness of breath. She denies any room spinning sensation or ringing in her ears. She tells me that she only notices the feeling when she is exerting herself.  Past Medical History:  Diagnosis Date  . Acute on chronic diastolic heart failure ([place]) 03/18/2018  . Arthritis   . Bone spur of toe of left foot   . Chronic diastolic (congestive) heart failure ([place])   . Essential hypertension   . GERD (gastroesophageal reflux disease)   . HLD  (hyperlipidemia)   . Hyperlipidemia 11/25/2016  . Pancreatic mass    a. benign appearing but needs f/u, noted on pre TAVR CTs  . 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 Severe aortic stenosis    a. 03/2018: s/p TAVR by Burt Knack and Dr. Roxy Manns  . Stage 3 chronic kidney disease   . TIA (transient ischemic attack)     a. 1992  . VSD (ventricular septal defect)     Past Surgical History:  Procedure Laterality Date  . ABDOMINAL HYSTERECTOMY    . APPENDECTOMY    . HERNIA REPAIR    . INTRAOPERATIVE TRANSTHORACIC ECHOCARDIOGRAM N/A 03/18/2018   Procedure: INTRAOPERATIVE TRANSTHORACIC ECHOCARDIOGRAM;  Surgeon: Sherren Mocha, MD;  Location: [place];  Service: Open Heart Surgery;  Laterality: N/A;  . PACEMAKER IMPLANT N/A 03/20/2018   Procedure: PACEMAKER IMPLANT;  Surgeon: Constance Haw, MD;  Location: [place] [facility];  Service: Cardiovascular;  Laterality: N/A;  . RIGHT HEART CATH N/A 03/20/2018   Procedure: RIGHT HEART CATH;  Surgeon: Sherren Mocha, MD;  Location: [place] [facility];  Service: Cardiovascular;  Laterality: N/A;  . RIGHT/LEFT HEART CATH AND CORONARY ANGIOGRAPHY N/A 02/06/2018   Procedure: RIGHT/LEFT HEART CATH AND CORONARY ANGIOGRAPHY;  Surgeon: Sherren Mocha, MD;  Location: [place] [facility];  Service: Cardiovascular;  Laterality: N/A;  . TONSILLECTOMY    . TRANSCATHETER AORTIC VALVE REPLACEMENT, TRANSFEMORAL N/A 03/18/2018   Procedure: TRANSCATHETER AORTIC VALVE REPLACEMENT, TRANSFEMORAL;  Surgeon: Sherren Mocha, MD;  Location: [place];  Service: Open Heart Surgery;  Laterality: N/A;  . WISDOM TOOTH EXTRACTION      Current Medications: Current Meds  Medication Sig  . acetaminophen (TYLENOL) 650 MG CR tablet Take 650-1,300 mg by mouth every 8 (eight) hours as needed for pain.  Marland Kitchen amoxicillin (AMOXIL) 500  MG tablet Take 4 tablets (2,000 mg total) by mouth as directed. 1 hour prior to dental work including cleanings  . aspirin EC 81 MG tablet Take 81 mg by mouth daily.   Marland Kitchen estradiol (ESTRACE) 1 MG tablet Take 0.5 mg by mouth daily.  . famotidine (PEPCID) 20 MG tablet Take 20 mg by mouth as needed for heartburn or indigestion.  . furosemide (LASIX) 20 MG tablet Take 1 tablet (20 mg total) by mouth 2 (two) times daily. Take 1 tablet alternating with 2 tablets, and 1 tablet on Saturday and Sunday  . Liniments (BLUE-EMU SUPER STRENGTH EX) Apply 1 application topically 3 (three) times daily as needed (for neck pain.).  Marland Kitchen metoprolol tartrate (LOPRESSOR) 50 MG tablet Take 1 tablet (50 mg total) by mouth 2 (two) times daily.  . Omega-3 Fatty Acids (FISH OIL) 1000 MG CAPS Take 1,000 mg by mouth daily.  Vladimir Faster Glycol-Propyl Glycol (SYSTANE OP) Place 1 drop into both eyes daily as needed (for dry eyes).  . pravastatin (PRAVACHOL) 20 MG tablet Take 20 mg by mouth at bedtime.   . Probiotic Product ([employer] PROBIOTIC) CHEW Chew 2 each by mouth daily after supper.   . vitamin B-12 (CYANOCOBALAMIN) 100 MCG tablet Take 100 mcg by mouth daily.  . Vitamin D, Ergocalciferol, (DRISDOL) 1.25 MG (50000 UT) CAPS capsule TAKE 1 CAPSULE BY MOUTH 2 TIMES A WEEK     Allergies:   Levofloxacin, Azithromycin, Clarithromycin, and Latex   Social History   Socioeconomic History  . Marital status: Married    Spouse name: Not on file  . Number of children: Not on file  . Years of education: Not on file  . Highest education level: Not on file  Occupational History  . Not on file  Tobacco Use  . Smoking status: Never Smoker  . Smokeless tobacco: Never Used  Substance and Sexual Activity  . Alcohol use: No  . Drug use: No  . Sexual activity: Not on file  Other Topics Concern  . Not on file  Social History Narrative  . Not on file   Social Determinants of Health   Financial Resource Strain:   . Difficulty of Paying  Living Expenses: Not on file  Food Insecurity:   . Worried About Charity fundraiser in the Last Year: Not on file  . Ran Out of Food in the Last Year: Not on file  Transportation Needs:   . Lack of Transportation (Medical): Not on file  . Lack of Transportation (Non-Medical): Not on file  Physical Activity:   . Days of Exercise per Week: Not on file  . Minutes of Exercise per Session: Not on file  Stress:   . Feeling of Stress : Not on file  Social Connections:   . Frequency of Communication with Friends and Family: Not on file  . Frequency of Social Gatherings with Friends and Family: Not on file  . Attends Religious Services: Not on file  . Active Member of Clubs  or Organizations: Not on file  . Attends Archivist Meetings: Not on file  . Marital Status: Not on file     Family History: The patient's family history includes Congenital heart disease in her brother; Heart attack in her brother; Heart disease in her mother; Uterine cancer in her sister.  ROS:   Review of Systems  Constitution: Negative for decreased appetite, fever and weight gain.  HENT: Negative for congestion, ear discharge, hoarse voice and sore throat.   Eyes: Negative for discharge, redness, vision loss in right eye and visual halos.  Cardiovascular: Negative for chest pain, dyspnea on exertion, leg swelling, orthopnea and palpitations.  Respiratory: Negative for cough, hemoptysis, shortness of breath and snoring.   Endocrine: Negative for heat intolerance and polyphagia.  Hematologic/Lymphatic: Negative for bleeding problem. Does not bruise/bleed easily.  Skin: Negative for flushing, nail changes, rash and suspicious lesions.  Musculoskeletal: Negative for arthritis, joint pain, muscle cramps, myalgias, neck pain and stiffness.  Gastrointestinal: Negative for abdominal pain, bowel incontinence, diarrhea and excessive appetite.  Genitourinary: Negative for decreased libido, genital sores and  incomplete emptying.  Neurological: Negative for brief paralysis, focal weakness, headaches and loss of balance.  Psychiatric/Behavioral: Negative for altered mental status, depression and suicidal ideas.  Allergic/Immunologic: Negative for HIV exposure and persistent infections.    EKGs/Labs/Other Studies Reviewed:    The following studies were reviewed today:   EKG: None today  Recent Labs: 11/03/2019: BUN 30; Creatinine, Ser 1.31; NT-Pro BNP 1,453; Potassium 4.9; Sodium 142  Recent Lipid Panel    Component Value Date/Time   CHOL 229 (H) 02/06/2019 1140   TRIG 164 (H) 02/06/2019 1140   HDL 90 02/06/2019 1140   CHOLHDL 2.5 02/06/2019 1140   LDLCALC 106 (H) 02/06/2019 1140    Physical Exam:    VS:  BP 128/60 (BP Location: Right Arm)   Pulse 74   Temp (!) 97 F (36.1 C)   Ht 5' (1.524 m)   Wt 121 lb 3.2 oz (55 kg)   SpO2 95%   BMI 23.67 kg/m     Wt Readings from Last 3 Encounters:  11/11/19 121 lb 3.2 oz (55 kg)  11/03/19 120 lb (54.4 kg)  10/06/19 121 lb (54.9 kg)     GEN: Well nourished, well developed in no acute distress HEENT: Normal NECK: No JVD; No carotid bruits LYMPHATICS: No lymphadenopathy CARDIAC: S1S2 noted,RRR, no murmurs, rubs, gallops RESPIRATORY:  Clear to auscultation without rales, wheezing or rhonchi  ABDOMEN: Soft, non-tender, non-distended, +bowel sounds, no guarding. EXTREMITIES: No edema, No cyanosis, no clubbing MUSCULOSKELETAL:  No deformity  SKIN: Warm and dry NEUROLOGIC:  Alert and oriented x 3, non-focal PSYCHIATRIC:  Normal affect, good insight  ASSESSMENT:    1. Episodic lightheadedness   2. S/P placement of cardiac pacemaker   3. Essential hypertension   4. S/P TAVR (transcatheter aortic valve replacement)    PLAN:    The patient continues to be lightheaded even after her beta blocker was stopped. Her symptoms appears to be associated more with activities.  After reviewing her most recent 09/2019 report of her remote  pacemaker interrogation her mode is set at DDD - she may benefit from rate responsiveness (setting her device DDDR) but I will defer to our EP colleague. With this in mind I believe it will be beneficial for the patient to follow up with EP with a clinic visit. If her device mode is adjusted and she still continues to have these symptoms I  will have low threshold to refer her to neurology.   The patient is in agreement with the above plan. The patient left the office in stable condition.  The patient will follow up in 1 month with Dr. Bettina Gavia.   Medication Adjustments/Labs and Tests Ordered: Current medicines are reviewed at length with the patient today.  Concerns regarding medicines are outlined above.  Orders Placed This Encounter  Procedures  . Ambulatory referral to Cardiac Electrophysiology   No orders of the defined types were placed in this encounter.   Patient Instructions  Medication Instructions:  Your physician recommends that you continue on your current medications as directed. Please refer to the Current Medication list given to you today.  *If you need a refill on your cardiac medications before your next appointment, please call your pharmacy*  Lab Work: None If you have labs (blood work) drawn today and your tests are completely normal, you will receive your results only by: Marland Kitchen MyChart Message (if you have MyChart) OR . A paper copy in the mail If you have any lab test that is abnormal or we need to change your treatment, we will call you to review the results.  Testing/Procedures: None  Follow-Up: At [facility], you and your health needs are our priority.  As part of our continuing mission to provide you with exceptional heart care, we have created designated Provider Care Teams.  These Care Teams include your primary Cardiologist (physician) and Advanced Practice Providers (APPs -  Physician Assistants and Nurse Practitioners) who all work together to provide you  with the care you need, when you need it.  Your next appointment:   1 month(s)  The format for your next appointment:   In Person  Provider:   Shirlee More, MD or Berniece Salines DO  Other Instructions You are being referred to Dr Curt Bears for your pacemaker.     Adopting a Healthy Lifestyle.  Know what a healthy weight is for you (roughly BMI <25) and aim to maintain this   Aim for 7+ servings of fruits and vegetables daily   65-80+ fluid ounces of water or unsweet tea for healthy kidneys   Limit to max 1 drink of alcohol per day; avoid smoking/tobacco   Limit animal fats in diet for cholesterol and heart health - choose grass fed whenever available   Avoid highly processed foods, and foods high in saturated/trans fats   Aim for low stress - take time to unwind and care for your mental health   Aim for 150 min of moderate intensity exercise weekly for heart health, and weights twice weekly for bone health   Aim for 7-9 hours of sleep daily   When it comes to diets, agreement about the perfect plan isnt easy to find, even among the experts. Experts at the [employer] developed an idea known as the Healthy Eating Plate. Just imagine a plate divided into logical, healthy portions.   The emphasis is on diet quality:   Load up on vegetables and fruits - one-half of your plate: Aim for color and variety, and remember that potatoes dont count.   Go for whole grains - one-quarter of your plate: Whole wheat, barley, wheat berries, quinoa, oats, brown rice, and foods made with them. If you want pasta, go with whole wheat pasta.   Protein power - one-quarter of your plate: Fish, chicken, beans, and nuts are all healthy, versatile protein sources. Limit red meat.   The diet,  however, does go beyond the plate, offering a few other suggestions.   Use healthy plant oils, such as olive, canola, soy, corn, sunflower and peanut. Check the labels, and avoid partially  hydrogenated oil, which have unhealthy trans fats.   If youre thirsty, drink water. Coffee and tea are good in moderation, but skip sugary drinks and limit milk and dairy products to one or two daily servings.   The type of carbohydrate in the diet is more important than the amount. Some sources of carbohydrates, such as vegetables, fruits, whole grains, and beans-are healthier than others.   Finally, stay active  Signed, Berniece Salines, DO  11/11/2019 8:59 PM    [facility]

## 2019-11-18 ENCOUNTER — Telehealth: Payer: Self-pay | Admitting: Cardiology

## 2019-11-18 DIAGNOSIS — R531 Weakness: Secondary | ICD-10-CM | POA: Diagnosis not present

## 2019-11-18 DIAGNOSIS — E860 Dehydration: Secondary | ICD-10-CM | POA: Diagnosis not present

## 2019-11-18 DIAGNOSIS — I10 Essential (primary) hypertension: Secondary | ICD-10-CM | POA: Diagnosis not present

## 2019-11-18 DIAGNOSIS — T881XXA Other complications following immunization, not elsewhere classified, initial encounter: Secondary | ICD-10-CM | POA: Diagnosis not present

## 2019-11-18 DIAGNOSIS — R079 Chest pain, unspecified: Secondary | ICD-10-CM | POA: Diagnosis not present

## 2019-11-18 NOTE — Telephone Encounter (Signed)
New Message   Patients daughter is calling because the patient had her covid vaccine and she had a reaction. She also states that she wants the doctor to be aware that the patient was placed back on amlodpine after Dr. Bettina Gavia had stopped the medication. She just wants the provider to be aware.

## 2019-11-18 NOTE — Telephone Encounter (Signed)
Just FYI.

## 2019-11-23 ENCOUNTER — Other Ambulatory Visit: Payer: Self-pay

## 2019-11-23 ENCOUNTER — Ambulatory Visit (INDEPENDENT_AMBULATORY_CARE_PROVIDER_SITE_OTHER): Payer: Medicare Other | Admitting: Cardiology

## 2019-11-23 ENCOUNTER — Encounter: Payer: Self-pay | Admitting: Cardiology

## 2019-11-23 VITALS — BP 136/70 | HR 67 | Ht 60.0 in | Wt 122.8 lb

## 2019-11-23 DIAGNOSIS — Z950 Presence of cardiac pacemaker: Secondary | ICD-10-CM | POA: Diagnosis not present

## 2019-11-23 DIAGNOSIS — I442 Atrioventricular block, complete: Secondary | ICD-10-CM

## 2019-11-23 LAB — CUP PACEART INCLINIC DEVICE CHECK
Battery Remaining Longevity: 144 mo
Battery Voltage: 3.01 V
Brady Statistic RA Percent Paced: 2.5 %
Brady Statistic RV Percent Paced: 0.14 %
Date Time Interrogation Session: 20210222104847
Implantable Lead Implant Date: 20190620
Implantable Lead Implant Date: 20190620
Implantable Lead Location: 753859
Implantable Lead Location: 753860
Implantable Pulse Generator Implant Date: 20190620
Lead Channel Impedance Value: 375 Ohm
Lead Channel Impedance Value: 450 Ohm
Lead Channel Pacing Threshold Amplitude: 0.75 V
Lead Channel Pacing Threshold Amplitude: 0.875 V
Lead Channel Pacing Threshold Pulse Width: 0.5 ms
Lead Channel Pacing Threshold Pulse Width: 0.5 ms
Lead Channel Sensing Intrinsic Amplitude: 4.6 mV
Lead Channel Sensing Intrinsic Amplitude: 8.6 mV
Lead Channel Setting Pacing Amplitude: 1.125
Lead Channel Setting Pacing Amplitude: 2 V
Lead Channel Setting Pacing Pulse Width: 0.5 ms
Lead Channel Setting Sensing Sensitivity: 2 mV
Pulse Gen Model: 2272
Pulse Gen Serial Number: 9035930

## 2019-11-23 NOTE — Patient Instructions (Signed)
Medication Instructions:  Your physician recommends that you continue on your current medications as directed. Please refer to the Current Medication list given to you today.  *If you need a refill on your cardiac medications before your next appointment, please call your pharmacy*  Lab Work: None ordered If you have labs (blood work) drawn today and your tests are completely normal, you will receive your results only by: Marland Kitchen MyChart Message (if you have MyChart) OR . A paper copy in the mail If you have any lab test that is abnormal or we need to change your treatment, we will call you to review the results.  Testing/Procedures: None ordered  Follow-Up: Remote monitoring is used to monitor your Pacemaker of ICD from home. This monitoring reduces the number of office visits required to check your device to one time per year. It allows [place] to keep an eye on the functioning of your device to ensure it is working properly. You are scheduled for a device check from home on 12/22/19. You may send your transmission at any time that day. If you have a wireless device, the transmission will be sent automatically. After your physician reviews your transmission, you will receive a postcard with your next transmission date.   At [facility], you and your health needs are our priority.  As part of our continuing mission to provide you with exceptional heart care, we have created designated Provider Care Teams.  These Care Teams include your primary Cardiologist (physician) and Advanced Practice Providers (APPs -  Physician Assistants and Nurse Practitioners) who all work together to provide you with the care you need, when you need it.  Your next appointment:   1 year(s)  The format for your next appointment:   In Person  Provider:   Allegra Lai, MD in [place]  Thank you for choosing [facility]!!   Trinidad Curet, RN [phone]

## 2019-11-23 NOTE — Progress Notes (Signed)
Electrophysiology Office Note   Date:  11/23/2019   ID:  Kaitlin Villarreal, DOB 1925/11/27, MRN [id number]  PCP:  Angelina Sheriff, MD  Cardiologist:  Bettina Gavia Primary Electrophysiologist:  Jmichael Gille Meredith Leeds, MD    No chief complaint on file.    History of Present Illness: Kaitlin Villarreal is a 86 y.o. female who is being seen today for the evaluation of complete heart block at the request of Shirlee More. Presenting today for electrophysiology evaluation.  She has a history of severe aortic stenosis status post TAVR, VSD, complete heart block status post [place] pacemaker [id number], chronic diastolic heart failure, hypertension.  Today, denies symptoms of palpitations, chest pain, shortness of breath, orthopnea, PND, lower extremity edema, claudication, dizziness, presyncope, syncope, bleeding, or neurologic sequela. The patient is tolerating medications without difficulties.  She has been having some dizziness.  She went to the emergency room and was noted to have fluid behind her ears.  Aside from that she has been doing well.  She has no chest pain or shortness of breath.   Past Medical History:  Diagnosis Date  . Acute on chronic diastolic heart failure ([place]) 03/18/2018  . Arthritis   . Bone spur of toe of left foot   . Chronic diastolic (congestive) heart failure ([place])   . Essential hypertension   . GERD (gastroesophageal reflux disease)   . HLD (hyperlipidemia)   . Hyperlipidemia 11/25/2016  . Pancreatic mass    a. benign appearing but needs f/u, noted on pre TAVR CTs  . 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 Severe aortic stenosis    a. 03/2018: s/p TAVR by Burt Knack and Dr. Roxy Manns  . Stage 3 chronic kidney disease   . TIA (transient ischemic attack)     a. 1992  . VSD (ventricular septal defect)    Past  Surgical History:  Procedure Laterality Date  . ABDOMINAL HYSTERECTOMY    . APPENDECTOMY    . HERNIA REPAIR    . INTRAOPERATIVE TRANSTHORACIC ECHOCARDIOGRAM N/A 03/18/2018   Procedure: INTRAOPERATIVE TRANSTHORACIC ECHOCARDIOGRAM;  Surgeon: Sherren Mocha, MD;  Location: [place];  Service: Open Heart Surgery;  Laterality: N/A;  . PACEMAKER IMPLANT N/A 03/20/2018   Procedure: PACEMAKER IMPLANT;  Surgeon: Constance Haw, MD;  Location: [place] [facility];  Service: Cardiovascular;  Laterality: N/A;  . RIGHT HEART CATH N/A 03/20/2018   Procedure: RIGHT HEART CATH;  Surgeon: Sherren Mocha, MD;  Location: [place] [facility];  Service: Cardiovascular;  Laterality: N/A;  . RIGHT/LEFT HEART CATH AND CORONARY ANGIOGRAPHY N/A 02/06/2018   Procedure: RIGHT/LEFT HEART CATH AND CORONARY ANGIOGRAPHY;  Surgeon: Sherren Mocha, MD;  Location: [place] [facility];  Service: Cardiovascular;  Laterality: N/A;  . TONSILLECTOMY    . TRANSCATHETER AORTIC VALVE REPLACEMENT, TRANSFEMORAL N/A 03/18/2018   Procedure: TRANSCATHETER AORTIC VALVE REPLACEMENT, TRANSFEMORAL;  Surgeon: Sherren Mocha, MD;  Location: [place];  Service: Open Heart Surgery;  Laterality: N/A;  . WISDOM TOOTH EXTRACTION       Current Outpatient Medications  Medication Sig Dispense Refill  . acetaminophen (TYLENOL) 650 MG CR tablet Take 650-1,300 mg by mouth every 8 (eight) hours as needed for pain.    Marland Kitchen amoxicillin (AMOXIL) 500 MG tablet Take 4 tablets (2,000 mg total) by mouth as directed. 1 hour prior to dental  work including cleanings 12 tablet 12  . aspirin EC 81 MG tablet Take 81 mg by mouth daily.     Marland Kitchen estradiol (ESTRACE) 1 MG tablet Take 0.5 mg by mouth daily.    . famotidine (PEPCID) 20 MG tablet Take 20 mg by mouth as needed for heartburn or indigestion.    . furosemide (LASIX) 20 MG tablet Take 1 tablet (20 mg total) by mouth 2 (two) times daily. Take 1 tablet alternating with 2 tablets, and 1 tablet on Saturday and Sunday 180  tablet 3  . Liniments (BLUE-EMU SUPER STRENGTH EX) Apply 1 application topically 3 (three) times daily as needed (for neck pain.).    Marland Kitchen metoprolol tartrate (LOPRESSOR) 50 MG tablet Take 1 tablet (50 mg total) by mouth 2 (two) times daily. 60 tablet 3  . Omega-3 Fatty Acids (FISH OIL) 1000 MG CAPS Take 1,000 mg by mouth daily.    Vladimir Faster Glycol-Propyl Glycol (SYSTANE OP) Place 1 drop into both eyes daily as needed (for dry eyes).    . pravastatin (PRAVACHOL) 20 MG tablet Take 20 mg by mouth at bedtime.     . Probiotic Product (CVS PROBIOTIC) CHEW Chew 2 each by mouth daily after supper.     . vitamin B-12 (CYANOCOBALAMIN) 100 MCG tablet Take 100 mcg by mouth daily.    . Vitamin D, Ergocalciferol, (DRISDOL) 1.25 MG (50000 UT) CAPS capsule TAKE 1 CAPSULE BY MOUTH 2 TIMES A WEEK  1   No current facility-administered medications for this visit.    Allergies:   Levofloxacin, Azithromycin, Clarithromycin, and Latex   Social History:  The patient  reports that she has never smoked. She has never used smokeless tobacco. She reports that she does not drink alcohol or use drugs.   Family History:  The patient's family history includes Congenital heart disease in her brother; Heart attack in her brother; Heart disease in her mother; Uterine cancer in her sister.    ROS:  Please see the history of present illness.   Otherwise, review of systems is positive for none.   All other systems are reviewed and negative.   PHYSICAL EXAM: VS:  BP 136/70   Pulse 67   Ht 5' (1.524 m)   Wt 122 lb 12.8 oz (55.7 kg)   SpO2 99%   BMI 23.98 kg/m  , BMI Body mass index is 23.98 kg/m. GEN: Well nourished, well developed, in no acute distress  HEENT: normal  Neck: no JVD, carotid bruits, or masses Cardiac: RRR; 2 out of 6 diastolic murmur at the base, no rubs, or gallops,no edema  Respiratory:  clear to auscultation bilaterally, normal work of breathing GI: soft, nontender, nondistended, + BS MS: no  deformity or atrophy  Skin: warm and dry, device site well healed Neuro:  Strength and sensation are intact Psych: euthymic mood, full affect  EKG:  EKG is not ordered today. Personal review of the ekg ordered 11/03/19 shows SR, rate 68  Personal review of the device interrogation today. Results in [place]: 11/03/2019: BUN 30; Creatinine, Ser 1.31; NT-Pro BNP 1,453; Potassium 4.9; Sodium 142    Lipid Panel     Component Value Date/Time   CHOL 229 (H) 02/06/2019 1140   TRIG 164 (H) 02/06/2019 1140   HDL 90 02/06/2019 1140   CHOLHDL 2.5 02/06/2019 1140   LDLCALC 106 (H) 02/06/2019 1140     Wt Readings from Last 3 Encounters:  11/23/19 122 lb 12.8 oz (55.7  kg)  11/11/19 121 lb 3.2 oz (55 kg)  11/03/19 120 lb (54.4 kg)      Other studies Reviewed: Additional studies/ records that were reviewed today include: TTE 05/05/18  Review of the above records today demonstrates:  - Left ventricle: The cavity size was normal. Wall thickness was   increased in a pattern of moderate LVH. Systolic function was   normal. The estimated ejection fraction was in the range of 60%   to 65%. Wall motion was normal; there were no regional wall   motion abnormalities. Features are consistent with a pseudonormal   left ventricular filling pattern, with concomitant abnormal   relaxation and increased filling pressure (grade 2 diastolic   dysfunction). - Aortic valve: A bioprosthesis was present. There was trivial   regurgitation. Valve area (VTI): 2.93 cm^2. Valve area (Vmax):   2.14 cm^2. Valve area (Vmean): 2.5 cm^2. - Mitral valve: There was mild regurgitation. - Left atrium: The atrium was moderately dilated. - Pulmonary arteries: Systolic pressure was moderately to severely   increased. PA peak pressure: 66 mm Hg (S).   ASSESSMENT AND PLAN:  1.  Complete heart block: Status post [facility] Jude dual-chamber pacemaker.  Device functioning appropriately.  He rarely atrial or  ventricular paces.  She has been having some dizziness, though I do not think that adjusting her pacemaker Quamaine Webb improve that.  She was meant to have some fluid in her ears and she has follow-up with her primary physician.  2.  Severe aortic stenosis: Status post TAVR  3.  Chronic diastolic heart failure: No obvious volume overload  4.  Hypertension: Mildly elevated.  Somara Frymire allow her primary physician to make decisions on her dizziness prior to making further adjustments.  Current medicines are reviewed at length with the patient today.   The patient does not have concerns regarding her medicines.  The following changes were made today: None  Labs/ tests ordered today include:  No orders of the defined types were placed in this encounter.    Disposition:   FU with Izik Bingman 12 months  Signed, Mylissa Lambe Meredith Leeds, MD  11/23/2019 10:38 AM     [facility] [address] [place] [address] [phone] (office) [phone] (fax)

## 2019-11-24 DIAGNOSIS — Z6823 Body mass index (BMI) 23.0-23.9, adult: Secondary | ICD-10-CM | POA: Diagnosis not present

## 2019-11-24 DIAGNOSIS — I10 Essential (primary) hypertension: Secondary | ICD-10-CM | POA: Diagnosis not present

## 2019-11-24 DIAGNOSIS — I499 Cardiac arrhythmia, unspecified: Secondary | ICD-10-CM | POA: Diagnosis not present

## 2019-11-24 DIAGNOSIS — Z79899 Other long term (current) drug therapy: Secondary | ICD-10-CM | POA: Diagnosis not present

## 2019-11-24 DIAGNOSIS — E785 Hyperlipidemia, unspecified: Secondary | ICD-10-CM | POA: Diagnosis not present

## 2019-11-24 DIAGNOSIS — R5381 Other malaise: Secondary | ICD-10-CM | POA: Diagnosis not present

## 2019-11-24 DIAGNOSIS — E559 Vitamin D deficiency, unspecified: Secondary | ICD-10-CM | POA: Diagnosis not present

## 2019-11-24 DIAGNOSIS — R5383 Other fatigue: Secondary | ICD-10-CM | POA: Diagnosis not present

## 2019-11-30 DIAGNOSIS — H02843 Edema of right eye, unspecified eyelid: Secondary | ICD-10-CM | POA: Diagnosis not present

## 2019-12-17 DIAGNOSIS — T50B95A Adverse effect of other viral vaccines, initial encounter: Secondary | ICD-10-CM | POA: Diagnosis not present

## 2019-12-17 DIAGNOSIS — H9319 Tinnitus, unspecified ear: Secondary | ICD-10-CM | POA: Diagnosis not present

## 2019-12-17 DIAGNOSIS — H938X9 Other specified disorders of ear, unspecified ear: Secondary | ICD-10-CM | POA: Diagnosis not present

## 2019-12-17 DIAGNOSIS — R42 Dizziness and giddiness: Secondary | ICD-10-CM | POA: Diagnosis not present

## 2019-12-17 DIAGNOSIS — R29818 Other symptoms and signs involving the nervous system: Secondary | ICD-10-CM | POA: Diagnosis not present

## 2019-12-17 DIAGNOSIS — H903 Sensorineural hearing loss, bilateral: Secondary | ICD-10-CM | POA: Diagnosis not present

## 2019-12-17 DIAGNOSIS — R269 Unspecified abnormalities of gait and mobility: Secondary | ICD-10-CM | POA: Diagnosis not present

## 2019-12-20 NOTE — Progress Notes (Signed)
Cardiology Office Note:    Date:  12/21/2019   ID:  Kaitlin Villarreal, DOB [birth year], MRN [id number]  PCP:  Angelina Sheriff, MD  Cardiologist:  Shirlee More, MD    Referring MD: Angelina Sheriff, MD    ASSESSMENT:    1. S/P TAVR (transcatheter aortic valve replacement)   2. Essential hypertension   3. Pacemaker   4. Complete AV block (HCC)   5. Episodic lightheadedness   6. Chronic diastolic heart failure (HCC)    PLAN:    In order of problems listed above:  1. Slowly and surely she is steadily improving.  When I had seen her previously her problem is excessive antihypertensive agents with orthostatic hypotension is resolved and BP presently is at target continue current treatment 2. Normal pacemaker function recently evaluated continue current pacing parameters 3. Lightheadedness improved and seems like an element of this was in her ear after ED primary care and ENT visits I am waiting for hospital records 4. Heart failure is compensated continue reduced dose diuretic and I think I prefer to take an extra dose as needed as opposed to high-dose diuretics with weakness and orthostatic hypotension.  Await labs from the hospital regarding potassium and renal function   Next appointment: 6 months   Medication Adjustments/Labs and Tests Ordered: Current medicines are reviewed at length with the patient today.  Concerns regarding medicines are outlined above.  No orders of the defined types were placed in this encounter.  No orders of the defined types were placed in this encounter.   Chief Complaint  Patient presents with  . Follow-up    After TAVR    History of Present Illness:    Kaitlin Villarreal is a 86 y.o. female with a hx of chronic diastolic heart failure, tension, hyperlipidemia, status post TAVR, status post pacemaker placement with a st. Jude dual chamber RA/RV device , hypertension with chronic kidney disease and post TAVR VSD  She was last seen 11/11/2019 with  lightheadedness.  She has been seen by electrophysiology in the interim as normal pacemaker function and did not feel that pacemaker programming will change her complaints of dizziness.  Compliance with diet, lifestyle and medications: Yes  I independently reviewed her last pacemaker download 11/21/2019 Normal pacemaker parameters and function.  She had 4 episodes of brief 10-second atrial tachycardia.  This visit became complicated when she tells me she has no emergency room at [facility] recently the reason for her visit with systolic blood pressure [id number] the treatment was IV fluids.  She followed up with her PCP was placed on Zyrtec was seen by ENT was using a nasal steroid and her complaints of dizziness and weakness are improved.  On her own she reduced her diuretic to 1 a day and she takes an extra as needed overall strength and endurance is better she is no longer dizzy and I will get a copy of those records from [facility] to review.  No arrhythmia documented she is followed up with the EP and she is not pacemaker dependent.  Her blood pressure is at target on reduced antihypertensive agents.  No shortness of breath orthopnea chest pain palpitations or syncope  I reviewed records from [place] ED visit 11/17/2018 her blood pressure was 185/76 CBC showed a hemoglobin of 10.6 mild anemia creatinine normal 1.0 potassium 3.9 serum sodium normal 139. She did not have a chest x-ray performed her EKG is described as sinus rhythm unremarkable. There is  no discharge diagnoses rhythm postvaccination syndrome and no specific treatment given to the patient no change in medications at discharge. There is not a second blood pressure documented in the chart. Past Medical History:  Diagnosis Date  . Acute on chronic diastolic heart failure ([place]) 03/18/2018  . Aortic stenosis 10/14/2014   Formatting of this note might be different from the original.  moderate by echo 11/2015  . Arthritis   . Bilateral  renal masses 07/26/2017  . Bone spur of toe of left foot   . Chronic diastolic (congestive) heart failure ([place])   . Chronic diastolic heart failure ([place]) 03/18/2018  . CKD (chronic kidney disease) stage 3, GFR 30-59 ml/min 07/26/2017  . Essential hypertension   . GERD (gastroesophageal reflux disease)   . HLD (hyperlipidemia)   . Hyperlipidemia 11/25/2016  . Pacemaker 09/02/2018  . Pancreatic lesion 04/17/2018  . Pancreatic mass    a. benign appearing but needs f/u, noted on pre TAVR CTs  . 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 Severe aortic stenosis    a. 03/2018: s/p TAVR by Burt Knack and Dr. Roxy Manns  . Stage 3 chronic kidney disease   . TIA (transient ischemic attack)     a. 1992  . VSD (ventricular septal defect)     Past Surgical History:  Procedure Laterality Date  . ABDOMINAL HYSTERECTOMY    . APPENDECTOMY    . HERNIA REPAIR    . INTRAOPERATIVE TRANSTHORACIC ECHOCARDIOGRAM N/A 03/18/2018   Procedure: INTRAOPERATIVE TRANSTHORACIC ECHOCARDIOGRAM;  Surgeon: Sherren Mocha, MD;  Location: [place];  Service: Open Heart Surgery;  Laterality: N/A;  . PACEMAKER IMPLANT N/A 03/20/2018   Procedure: PACEMAKER IMPLANT;  Surgeon: Constance Haw, MD;  Location: [place] [facility];  Service: Cardiovascular;  Laterality: N/A;  . RIGHT HEART CATH N/A 03/20/2018   Procedure: RIGHT HEART CATH;  Surgeon: Sherren Mocha, MD;  Location: [place] [facility];  Service: Cardiovascular;  Laterality: N/A;  . RIGHT/LEFT HEART CATH AND CORONARY ANGIOGRAPHY N/A 02/06/2018   Procedure: RIGHT/LEFT HEART CATH AND CORONARY ANGIOGRAPHY;  Surgeon: Sherren Mocha, MD;  Location: [place] [facility];  Service: Cardiovascular;  Laterality: N/A;  . TONSILLECTOMY    . TRANSCATHETER AORTIC VALVE REPLACEMENT, TRANSFEMORAL N/A 03/18/2018   Procedure:  TRANSCATHETER AORTIC VALVE REPLACEMENT, TRANSFEMORAL;  Surgeon: Sherren Mocha, MD;  Location: [place];  Service: Open Heart Surgery;  Laterality: N/A;  . WISDOM TOOTH EXTRACTION      Current Medications: Current Meds  Medication Sig  . acetaminophen (TYLENOL) 650 MG CR tablet Take 650-1,300 mg by mouth every 8 (eight) hours as needed for pain.  Marland Kitchen amoxicillin (AMOXIL) 500 MG tablet Take 4 tablets (2,000 mg total) by mouth as directed. 1 hour prior to dental work including cleanings  . aspirin EC 81 MG tablet Take 81 mg by mouth daily.   Marland Kitchen estradiol (ESTRACE) 1 MG tablet Take 0.5 mg by mouth daily.  . famotidine (PEPCID) 20 MG tablet Take 20 mg by mouth as needed for heartburn or indigestion.  . furosemide (LASIX) 20 MG tablet Take 20 mg by mouth daily.  . Liniments (BLUE-EMU SUPER STRENGTH EX) Apply 1 application topically 3 (three) times daily as needed (for neck pain.).  Marland Kitchen metoprolol tartrate (LOPRESSOR) 50 MG tablet Take 1 tablet (50 mg total) by mouth 2 (two) times daily.  Marland Kitchen  Omega-3 Fatty Acids (FISH OIL) 1000 MG CAPS Take 1,000 mg by mouth daily.  Vladimir Faster Glycol-Propyl Glycol (SYSTANE OP) Place 1 drop into both eyes daily as needed (for dry eyes).  . pravastatin (PRAVACHOL) 20 MG tablet Take 20 mg by mouth at bedtime.   . Probiotic Product (CVS PROBIOTIC) CHEW Chew 2 each by mouth daily after supper.   . vitamin B-12 (CYANOCOBALAMIN) 100 MCG tablet Take 100 mcg by mouth daily.  . Vitamin D, Ergocalciferol, (DRISDOL) 1.25 MG (50000 UT) CAPS capsule TAKE 1 CAPSULE BY MOUTH 2 TIMES A WEEK     Allergies:   Levofloxacin, Azithromycin, Clarithromycin, and Latex   Social History   Socioeconomic History  . Marital status: Married    Spouse name: Not on file  . Number of children: Not on file  . Years of education: Not on file  . Highest education level: Not on file  Occupational History  . Not on file  Tobacco Use  . Smoking status: Never Smoker  . Smokeless tobacco: Never Used    Substance and Sexual Activity  . Alcohol use: No  . Drug use: No  . Sexual activity: Not on file  Other Topics Concern  . Not on file  Social History Narrative  . Not on file   Social Determinants of Health   Financial Resource Strain:   . Difficulty of Paying Living Expenses:   Food Insecurity:   . Worried About Charity fundraiser in the Last Year:   . Arboriculturist in the Last Year:   Transportation Needs:   . Film/video editor (Medical):   Marland Kitchen Lack of Transportation (Non-Medical):   Physical Activity:   . Days of Exercise per Week:   . Minutes of Exercise per Session:   Stress:   . Feeling of Stress :   Social Connections:   . Frequency of Communication with Friends and Family:   . Frequency of Social Gatherings with Friends and Family:   . Attends Religious Services:   . Active Member of Clubs or Organizations:   . Attends Archivist Meetings:   Marland Kitchen Marital Status:      Family History: The patient's family history includes Congenital heart disease in her brother; Heart attack in her brother; Heart disease in her mother; Uterine cancer in her sister. ROS:   Please see the history of present illness.    All other systems reviewed and are negative.  EKGs/Labs/Other Studies Reviewed:    The following studies were reviewed today:    Recent Labs: 11/03/2019: BUN 30; Creatinine, Ser 1.31; NT-Pro BNP 1,453; Potassium 4.9; Sodium 142  Recent Lipid Panel    Component Value Date/Time   CHOL 229 (H) 02/06/2019 1140   TRIG 164 (H) 02/06/2019 1140   HDL 90 02/06/2019 1140   CHOLHDL 2.5 02/06/2019 1140   LDLCALC 106 (H) 02/06/2019 1140    Physical Exam:    VS:  BP 110/62   Pulse 66   Ht 5' (1.524 m)   Wt 121 lb (54.9 kg)   SpO2 97%   BMI 23.63 kg/m     Wt Readings from Last 3 Encounters:  12/21/19 121 lb (54.9 kg)  11/23/19 122 lb 12.8 oz (55.7 kg)  11/11/19 121 lb 3.2 oz (55 kg)     GEN:  Well nourished, well developed in no acute  distress HEENT: Normal NECK: No JVD; No carotid bruits LYMPHATICS: No lymphadenopathy CARDIAC: RRR, no murmurs, rubs, gallops RESPIRATORY:  Clear to auscultation without rales, wheezing or rhonchi  ABDOMEN: Soft, non-tender, non-distended MUSCULOSKELETAL:  No edema; No deformity  SKIN: Warm and dry NEUROLOGIC:  Alert and oriented x 3 PSYCHIATRIC:  Normal affect    Signed, Shirlee More, MD  12/21/2019 11:39 AM    [place]

## 2019-12-21 ENCOUNTER — Other Ambulatory Visit: Payer: Self-pay

## 2019-12-21 ENCOUNTER — Ambulatory Visit (INDEPENDENT_AMBULATORY_CARE_PROVIDER_SITE_OTHER): Payer: Medicare Other | Admitting: Cardiology

## 2019-12-21 ENCOUNTER — Encounter: Payer: Self-pay | Admitting: Cardiology

## 2019-12-21 VITALS — BP 110/62 | HR 66 | Ht 60.0 in | Wt 121.0 lb

## 2019-12-21 DIAGNOSIS — Z952 Presence of prosthetic heart valve: Secondary | ICD-10-CM | POA: Diagnosis not present

## 2019-12-21 DIAGNOSIS — R42 Dizziness and giddiness: Secondary | ICD-10-CM | POA: Diagnosis not present

## 2019-12-21 DIAGNOSIS — Z950 Presence of cardiac pacemaker: Secondary | ICD-10-CM | POA: Diagnosis not present

## 2019-12-21 DIAGNOSIS — I442 Atrioventricular block, complete: Secondary | ICD-10-CM | POA: Diagnosis not present

## 2019-12-21 DIAGNOSIS — I5032 Chronic diastolic (congestive) heart failure: Secondary | ICD-10-CM

## 2019-12-21 DIAGNOSIS — I10 Essential (primary) hypertension: Secondary | ICD-10-CM | POA: Diagnosis not present

## 2019-12-21 NOTE — Patient Instructions (Signed)

## 2019-12-22 ENCOUNTER — Ambulatory Visit (INDEPENDENT_AMBULATORY_CARE_PROVIDER_SITE_OTHER): Payer: Medicare Other | Admitting: *Deleted

## 2019-12-22 DIAGNOSIS — I5032 Chronic diastolic (congestive) heart failure: Secondary | ICD-10-CM | POA: Diagnosis not present

## 2019-12-22 LAB — CUP PACEART REMOTE DEVICE CHECK
Battery Remaining Longevity: 128 mo
Battery Remaining Percentage: 95.5 %
Battery Voltage: 3.01 V
Brady Statistic AP VP Percent: 1 %
Brady Statistic AP VS Percent: 10 %
Brady Statistic AS VP Percent: 1 %
Brady Statistic AS VS Percent: 90 %
Brady Statistic RA Percent Paced: 9.8 %
Brady Statistic RV Percent Paced: 1 %
Date Time Interrogation Session: 20210323020019
Implantable Lead Implant Date: 20190620
Implantable Lead Implant Date: 20190620
Implantable Lead Location: 753859
Implantable Lead Location: 753860
Implantable Pulse Generator Implant Date: 20190620
Lead Channel Impedance Value: 350 Ohm
Lead Channel Impedance Value: 430 Ohm
Lead Channel Pacing Threshold Amplitude: 0.75 V
Lead Channel Pacing Threshold Amplitude: 0.75 V
Lead Channel Pacing Threshold Pulse Width: 0.5 ms
Lead Channel Pacing Threshold Pulse Width: 0.5 ms
Lead Channel Sensing Intrinsic Amplitude: 4.6 mV
Lead Channel Sensing Intrinsic Amplitude: 8.8 mV
Lead Channel Setting Pacing Amplitude: 1 V
Lead Channel Setting Pacing Amplitude: 2 V
Lead Channel Setting Pacing Pulse Width: 0.5 ms
Lead Channel Setting Sensing Sensitivity: 2 mV
Pulse Gen Model: 2272
Pulse Gen Serial Number: 9035930

## 2019-12-23 NOTE — Progress Notes (Signed)
PPM Remote  

## 2020-01-12 DIAGNOSIS — Z1231 Encounter for screening mammogram for malignant neoplasm of breast: Secondary | ICD-10-CM | POA: Diagnosis not present

## 2020-02-18 DIAGNOSIS — L570 Actinic keratosis: Secondary | ICD-10-CM | POA: Diagnosis not present

## 2020-02-18 DIAGNOSIS — L578 Other skin changes due to chronic exposure to nonionizing radiation: Secondary | ICD-10-CM | POA: Diagnosis not present

## 2020-02-18 DIAGNOSIS — L821 Other seborrheic keratosis: Secondary | ICD-10-CM | POA: Diagnosis not present

## 2020-03-02 ENCOUNTER — Other Ambulatory Visit: Payer: Self-pay | Admitting: Cardiology

## 2020-03-21 NOTE — Progress Notes (Signed)
Cardiology Office Note:    Date:  03/22/2020   ID:  Kaitlin Villarreal, DOB October 26, 1925, MRN [id number]  PCP:  Angelina Sheriff, MD  Cardiologist:  Shirlee More, MD    Referring MD: Angelina Sheriff, MD    ASSESSMENT:    1. Chronic diastolic heart failure ([place])   2. Essential hypertension   3. Pacemaker   4. S/P TAVR (transcatheter aortic valve replacement)   5. Stage 3 chronic kidney disease, unspecified whether stage 3a or 3b CKD   6. Shortness of breath    PLAN:    In order of problems listed above:  1. Improved compensated doing well on a lower dose of diuretic New York Heart Association class I 2. Stable BP at target continue current treatment diuretic beta-blocker 3. Stable function not pacemaker dependent continue to follow her device clinic 4. Stable finally has improved and is back to a good functional level 5. Recheck renal function potassium today 6. Resolved heart failure is compensated 7. Stable dyslipidemia continue her low intensity statin per guidelines post TAVR   Next appointment: 3 months   Medication Adjustments/Labs and Tests Ordered: Current medicines are reviewed at length with the patient today.  Concerns regarding medicines are outlined above.  No orders of the defined types were placed in this encounter.  No orders of the defined types were placed in this encounter.   Chief Complaint  Patient presents with  . Follow-up  . Congestive Heart Failure    History of Present Illness:    Kaitlin Villarreal is a 86 y.o. female with a hx of  chronic diastolic heart failure, tension, hyperlipidemia, status post TAVR, status post pacemaker placement with a st. Jude dual chamber RA/RV device , hypertension with chronic kidney disease and post TAVR VSD  last seen 12/21/2019. Compliance with diet, lifestyle and medications: Yes  It is nice to see that she is finally improved she is vigorous active no exercise intolerance edema shortness of breath chest pain  palpitation or syncope.  Is been a long recovery after TAVR. Past Medical History:  Diagnosis Date  . Acute on chronic diastolic heart failure ([place]) 03/18/2018  . Aortic stenosis 10/14/2014   Formatting of this note might be different from the original.  moderate by echo 11/2015  . Arthritis   . Bilateral renal masses 07/26/2017  . Bone spur of toe of left foot   . Chronic diastolic (congestive) heart failure ([place])   . Chronic diastolic heart failure ([place]) 03/18/2018  . CKD (chronic kidney disease) stage 3, GFR 30-59 ml/min 07/26/2017  . Essential hypertension   . GERD (gastroesophageal reflux disease)   . HLD (hyperlipidemia)   . Hyperlipidemia 11/25/2016  . Pacemaker 09/02/2018  . Pancreatic lesion 04/17/2018  . Pancreatic mass    a. benign appearing but needs f/u, noted on pre TAVR CTs  . 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 Severe aortic stenosis    a. 03/2018: s/p TAVR by Burt Knack and Dr. Roxy Manns  . Stage 3 chronic kidney disease   . TIA (transient ischemic attack)     a. 1992  . VSD (ventricular septal defect)     Past Surgical History:  Procedure Laterality Date  . ABDOMINAL HYSTERECTOMY    . APPENDECTOMY    . HERNIA REPAIR    .  INTRAOPERATIVE TRANSTHORACIC ECHOCARDIOGRAM N/A 03/18/2018   Procedure: INTRAOPERATIVE TRANSTHORACIC ECHOCARDIOGRAM;  Surgeon: Sherren Mocha, MD;  Location: [place];  Service: Open Heart Surgery;  Laterality: N/A;  . PACEMAKER IMPLANT N/A 03/20/2018   Procedure: PACEMAKER IMPLANT;  Surgeon: Constance Haw, MD;  Location: [place] [facility];  Service: Cardiovascular;  Laterality: N/A;  . RIGHT HEART CATH N/A 03/20/2018   Procedure: RIGHT HEART CATH;  Surgeon: Sherren Mocha, MD;  Location: [place] [facility];  Service: Cardiovascular;  Laterality: N/A;  . RIGHT/LEFT HEART CATH AND  CORONARY ANGIOGRAPHY N/A 02/06/2018   Procedure: RIGHT/LEFT HEART CATH AND CORONARY ANGIOGRAPHY;  Surgeon: Sherren Mocha, MD;  Location: [place] [facility];  Service: Cardiovascular;  Laterality: N/A;  . TONSILLECTOMY    . TRANSCATHETER AORTIC VALVE REPLACEMENT, TRANSFEMORAL N/A 03/18/2018   Procedure: TRANSCATHETER AORTIC VALVE REPLACEMENT, TRANSFEMORAL;  Surgeon: Sherren Mocha, MD;  Location: [place];  Service: Open Heart Surgery;  Laterality: N/A;  . WISDOM TOOTH EXTRACTION      Current Medications: Current Meds  Medication Sig  . acetaminophen (TYLENOL) 650 MG CR tablet Take 650-1,300 mg by mouth every 8 (eight) hours as needed for pain.  Marland Kitchen amoxicillin (AMOXIL) 500 MG tablet Take 4 tablets (2,000 mg total) by mouth as directed. 1 hour prior to dental work including cleanings  . aspirin EC 81 MG tablet Take 81 mg by mouth daily.   Marland Kitchen estradiol (ESTRACE) 1 MG tablet Take 0.5 mg by mouth daily.  . famotidine (PEPCID) 20 MG tablet Take 20 mg by mouth as needed for heartburn or indigestion.  . fluconazole (DIFLUCAN) 200 MG tablet Take 200 mg by mouth once a week.  . fluticasone (FLONASE) 50 MCG/ACT nasal spray Place 2 sprays into both nostrils daily.  . furosemide (LASIX) 20 MG tablet Take 20 mg by mouth daily.  Marland Kitchen ketoconazole (NIZORAL) 2 % shampoo Apply 2 application topically 3 (three) times a week.  . Liniments (BLUE-EMU SUPER STRENGTH EX) Apply 1 application topically 3 (three) times daily as needed (for neck pain.).  Marland Kitchen metoprolol tartrate (LOPRESSOR) 50 MG tablet Take 1 tablet by mouth twice daily  . Omega-3 Fatty Acids (FISH OIL) 1000 MG CAPS Take 1,000 mg by mouth daily.  Vladimir Faster Glycol-Propyl Glycol (SYSTANE OP) Place 1 drop into both eyes daily as needed (for dry eyes).  . pravastatin (PRAVACHOL) 20 MG tablet Take 20 mg by mouth at bedtime.   . Probiotic Product ([employer] PROBIOTIC) CHEW Chew 2 each by mouth daily after supper.   . triamcinolone cream (KENALOG) 0.5 % Apply 3  application topically 3 (three) times a week.  . vitamin B-12 (CYANOCOBALAMIN) 100 MCG tablet Take 100 mcg by mouth daily.  . Vitamin D, Ergocalciferol, (DRISDOL) 1.25 MG (50000 UT) CAPS capsule TAKE 1 CAPSULE BY MOUTH 2 TIMES A WEEK     Allergies:   Levofloxacin, Azithromycin, Clarithromycin, and Latex   Social History   Socioeconomic History  . Marital status: Married    Spouse name: Not on file  . Number of children: Not on file  . Years of education: Not on file  . Highest education level: Not on file  Occupational History  . Not on file  Tobacco Use  . Smoking status: Never Smoker  . Smokeless tobacco: Never Used  Vaping Use  . Vaping Use: Never used  Substance and Sexual Activity  . Alcohol use: No  . Drug use: No  . Sexual activity: Not on file  Other Topics Concern  .  Not on file  Social History Narrative  . Not on file   Social Determinants of Health   Financial Resource Strain:   . Difficulty of Paying Living Expenses:   Food Insecurity:   . Worried About Charity fundraiser in the Last Year:   . Arboriculturist in the Last Year:   Transportation Needs:   . Film/video editor (Medical):   Marland Kitchen Lack of Transportation (Non-Medical):   Physical Activity:   . Days of Exercise per Week:   . Minutes of Exercise per Session:   Stress:   . Feeling of Stress :   Social Connections:   . Frequency of Communication with Friends and Family:   . Frequency of Social Gatherings with Friends and Family:   . Attends Religious Services:   . Active Member of Clubs or Organizations:   . Attends Archivist Meetings:   Marland Kitchen Marital Status:      Family History: The patient's family history includes Congenital heart disease in her brother; Heart attack in her brother; Heart disease in her mother; Uterine cancer in her sister. ROS:   Please see the history of present illness.    All other systems reviewed and are negative.  EKGs/Labs/Other Studies Reviewed:     The following studies were reviewed today: Last pacemaker check independently reviewed 12/22/2019 with normal parameters and function.  She had no mode switching episodes no atrial fibrillation and is atrially paced about 1% ventricularly paced 10% of the time  Recent Labs: 11/03/2019: BUN 30; Creatinine, Ser 1.31; NT-Pro BNP 1,453; Potassium 4.9; Sodium 142  Recent Lipid Panel    Component Value Date/Time   CHOL 229 (H) 02/06/2019 1140   TRIG 164 (H) 02/06/2019 1140   HDL 90 02/06/2019 1140   CHOLHDL 2.5 02/06/2019 1140   LDLCALC 106 (H) 02/06/2019 1140    Physical Exam:    VS:  BP 110/60   Pulse 69   Ht 5' (1.524 m)   Wt 116 lb 9.6 oz (52.9 kg)   SpO2 (!) 88%   BMI 22.77 kg/m     Wt Readings from Last 3 Encounters:  03/22/20 116 lb 9.6 oz (52.9 kg)  12/21/19 121 lb (54.9 kg)  11/23/19 122 lb 12.8 oz (55.7 kg)     GEN: She looks less frail well nourished, well developed in no acute distress HEENT: Normal NECK: No JVD; No carotid bruits LYMPHATICS: No lymphadenopathy CARDIAC: Grade 1/6 to 2/6 precordial holosystolic murmur no aortic regurgitation RRR, no murmurs, rubs, gallops RESPIRATORY:  Clear to auscultation without rales, wheezing or rhonchi  ABDOMEN: Soft, non-tender, non-distended MUSCULOSKELETAL:  No edema; No deformity  SKIN: Warm and dry NEUROLOGIC:  Alert and oriented x 3 PSYCHIATRIC:  Normal affect    Signed, Shirlee More, MD  03/22/2020 11:32 AM    [place]

## 2020-03-22 ENCOUNTER — Ambulatory Visit (INDEPENDENT_AMBULATORY_CARE_PROVIDER_SITE_OTHER): Payer: Medicare Other | Admitting: *Deleted

## 2020-03-22 ENCOUNTER — Encounter: Payer: Self-pay | Admitting: Cardiology

## 2020-03-22 ENCOUNTER — Ambulatory Visit (INDEPENDENT_AMBULATORY_CARE_PROVIDER_SITE_OTHER): Payer: Medicare Other | Admitting: Cardiology

## 2020-03-22 ENCOUNTER — Other Ambulatory Visit: Payer: Self-pay

## 2020-03-22 VITALS — BP 110/60 | HR 69 | Ht 60.0 in | Wt 116.6 lb

## 2020-03-22 DIAGNOSIS — I5032 Chronic diastolic (congestive) heart failure: Secondary | ICD-10-CM | POA: Diagnosis not present

## 2020-03-22 DIAGNOSIS — Z950 Presence of cardiac pacemaker: Secondary | ICD-10-CM | POA: Diagnosis not present

## 2020-03-22 DIAGNOSIS — I10 Essential (primary) hypertension: Secondary | ICD-10-CM

## 2020-03-22 DIAGNOSIS — R0602 Shortness of breath: Secondary | ICD-10-CM

## 2020-03-22 DIAGNOSIS — Z952 Presence of prosthetic heart valve: Secondary | ICD-10-CM

## 2020-03-22 DIAGNOSIS — I442 Atrioventricular block, complete: Secondary | ICD-10-CM

## 2020-03-22 DIAGNOSIS — N183 Chronic kidney disease, stage 3 unspecified: Secondary | ICD-10-CM

## 2020-03-22 LAB — CUP PACEART REMOTE DEVICE CHECK
Battery Remaining Longevity: 130 mo
Battery Remaining Percentage: 95.5 %
Battery Voltage: 3.01 V
Brady Statistic AP VP Percent: 1 %
Brady Statistic AP VS Percent: 6.8 %
Brady Statistic AS VP Percent: 1 %
Brady Statistic AS VS Percent: 93 %
Brady Statistic RA Percent Paced: 6.6 %
Brady Statistic RV Percent Paced: 1 %
Date Time Interrogation Session: 20210622020017
Implantable Lead Implant Date: 20190620
Implantable Lead Implant Date: 20190620
Implantable Lead Location: 753859
Implantable Lead Location: 753860
Implantable Pulse Generator Implant Date: 20190620
Lead Channel Impedance Value: 380 Ohm
Lead Channel Impedance Value: 450 Ohm
Lead Channel Pacing Threshold Amplitude: 0.75 V
Lead Channel Pacing Threshold Amplitude: 0.75 V
Lead Channel Pacing Threshold Pulse Width: 0.5 ms
Lead Channel Pacing Threshold Pulse Width: 0.5 ms
Lead Channel Sensing Intrinsic Amplitude: 5 mV
Lead Channel Sensing Intrinsic Amplitude: 9.3 mV
Lead Channel Setting Pacing Amplitude: 1 V
Lead Channel Setting Pacing Amplitude: 2 V
Lead Channel Setting Pacing Pulse Width: 0.5 ms
Lead Channel Setting Sensing Sensitivity: 2 mV
Pulse Gen Model: 2272
Pulse Gen Serial Number: 9035930

## 2020-03-22 NOTE — Patient Instructions (Signed)
Medication Instructions:  Your physician recommends that you continue on your current medications as directed. Please refer to the Current Medication list given to you today.  *If you need a refill on your cardiac medications before your next appointment, please call your pharmacy*   Lab Work: Your physician recommends that you return for lab work in: TODAY BMP, ProBNP If you have labs (blood work) drawn today and your tests are completely normal, you will receive your results only by: Marland Kitchen MyChart Message (if you have MyChart) OR . A paper copy in the mail If you have any lab test that is abnormal or we need to change your treatment, we will call you to review the results.   Testing/Procedures: None   Follow-Up: At [facility], you and your health needs are our priority.  As part of our continuing mission to provide you with exceptional heart care, we have created designated Provider Care Teams.  These Care Teams include your primary Cardiologist (physician) and Advanced Practice Providers (APPs -  Physician Assistants and Nurse Practitioners) who all work together to provide you with the care you need, when you need it.  We recommend signing up for the patient portal called "MyChart".  Sign up information is provided on this After Visit Summary.  MyChart is used to connect with patients for Virtual Visits (Telemedicine).  Patients are able to view lab/test results, encounter notes, upcoming appointments, etc.  Non-urgent messages can be sent to your provider as well.   To learn more about what you can do with MyChart, go to [email or web address].    Your next appointment:   3 month(s)  The format for your next appointment:   In Person  Provider:   Shirlee More, MD   Other Instructions

## 2020-03-23 ENCOUNTER — Telehealth: Payer: Self-pay

## 2020-03-23 LAB — BASIC METABOLIC PANEL
BUN/Creatinine Ratio: 22 (ref 12–28)
BUN: 28 mg/dL (ref 10–36)
CO2: 27 mmol/L (ref 20–29)
Calcium: 9.9 mg/dL (ref 8.7–10.3)
Chloride: 100 mmol/L (ref 96–106)
Creatinine, Ser: 1.26 mg/dL — ABNORMAL HIGH (ref 0.57–1.00)
GFR calc Af Amer: 43 mL/min/{1.73_m2} — ABNORMAL LOW (ref >59)
GFR calc non Af Amer: 37 mL/min/{1.73_m2} — ABNORMAL LOW (ref >59)
Glucose: 81 mg/dL (ref 65–99)
Potassium: 4.5 mmol/L (ref 3.5–5.2)
Sodium: 141 mmol/L (ref 134–144)

## 2020-03-23 LAB — PRO B NATRIURETIC PEPTIDE: NT-Pro BNP: 1445 pg/mL — ABNORMAL HIGH (ref 0–738)

## 2020-03-23 NOTE — Telephone Encounter (Signed)
Spoke with patient regarding results and recommendation.  Patient verbalizes understanding and is agreeable to plan of care. Advised patient to call back with any issues or concerns.  

## 2020-03-23 NOTE — Progress Notes (Signed)
Remote pacemaker transmission.   

## 2020-03-23 NOTE — Telephone Encounter (Signed)
-----   Message from Richardo Priest, MD sent at 03/23/2020  7:49 AM EDT ----- Normal or stable result  No changes

## 2020-04-27 ENCOUNTER — Telehealth: Payer: Self-pay | Admitting: Cardiology

## 2020-04-27 MED ORDER — CLONIDINE HCL 0.1 MG PO TABS: 0.1000 mg | ORAL_TABLET | Freq: Every day | ORAL | 11 refills | Status: DC | PRN

## 2020-04-27 NOTE — Telephone Encounter (Signed)
Pt c/o BP issue: STAT if pt c/o blurred vision, one-sided weakness or slurred speech  1. What are your last 5 BP readings?  202/60 when she woke up this am 183/58 9-10am today 169/59 12:30-1pm today  2. Are you having any other symptoms (ex. Dizziness, headache, blurred vision, passed out)? States she felt real terrible yesterday, she was "lifeless". Not real steady on her feet. She isn't really sure of what her symptoms were. She is feeling better today but still has no energy. She has a dull headache right now, not pounding.   3. What is your BP issue? It has been high a little over a month, patient thinks it's associated with meds she is taking.     Patient thinks she has been having this trouble with her BP since she started fluconazole (DIFLUCAN) 200 MG tablet, ketoconazole (NIZORAL) 2 % shampoo, triamcinolone cream (KENALOG) 0.5 % on 6/1. Patient tried to make an appointment with [facility] but was not able to get one.

## 2020-04-27 NOTE — Telephone Encounter (Signed)
Spoke to patient just now and let her know Dr. Joya Gaskins recommendations. She states that she is using a digital arm cuff and that she will follow these instructions. No other issues or concerns were noted at this time.    Encouraged patient to call back with any questions or concerns.

## 2020-04-27 NOTE — Telephone Encounter (Signed)
Lets give her another prescription for clonidine 0.1 mg she can take it as needed daily for systolic of 684 or greater she just needs to stay on the couch for 30 minutes afterwards to avoid symptomatic hypotension.  Please be sure that she is using an arm cuff digital and not a wrist device.

## 2020-05-10 DIAGNOSIS — Z6822 Body mass index (BMI) 22.0-22.9, adult: Secondary | ICD-10-CM | POA: Diagnosis not present

## 2020-05-10 DIAGNOSIS — I10 Essential (primary) hypertension: Secondary | ICD-10-CM | POA: Diagnosis not present

## 2020-05-10 DIAGNOSIS — N189 Chronic kidney disease, unspecified: Secondary | ICD-10-CM | POA: Diagnosis not present

## 2020-05-10 DIAGNOSIS — D510 Vitamin B12 deficiency anemia due to intrinsic factor deficiency: Secondary | ICD-10-CM | POA: Diagnosis not present

## 2020-05-20 DIAGNOSIS — Z23 Encounter for immunization: Secondary | ICD-10-CM | POA: Diagnosis not present

## 2020-05-27 ENCOUNTER — Telehealth: Payer: Self-pay | Admitting: Cardiology

## 2020-05-27 NOTE — Telephone Encounter (Signed)
Schedule next week  She has a prescription for clonidine I like her to take it every evening at bedtime 0.1 mg if systolics are greater than 180 in the morning she should take it in the morning

## 2020-05-27 NOTE — Telephone Encounter (Signed)
Reviewed recommendations with pt. She will take 0.1mg  clonidine qhs. Measure BP in the AM and will take an additional 0.1mg  clonidine for SBP > 180mg . Continue taking 10mg  lisinopril qd. She repeated instructions and had no additional questions.   Pt will be in to see Dr. Bettina Gavia on Mon 8/30 per his request.

## 2020-05-27 NOTE — Telephone Encounter (Signed)
Pt c/o BP issue: STAT if pt c/o blurred vision, one-sided weakness or slurred speech  1. What are your last 5 BP readings? 201/65  8/26  Pm     8/27   205/66 am 8am  188/64  10:50a  2. Are you having any other symptoms (ex. Dizziness, headache, blurred vision, passed out)?   3. What is your BP issue? HIGH pt would like to be seen.

## 2020-05-27 NOTE — Telephone Encounter (Signed)
Pt calling to report high BP readings today. Her PCP, Dr. Lin Landsman, has started her on Lisinopril, 10mg  qd last week. She took it around 9am and her pressure has been coming down. Last reading at 1050 was 188/64, down from 205/66 this morning. She confirms she is using an arm cuff.   I advised her to recheck her BP around lunch time. If her SBP > 180, she may take a PRN Clonidine per Dr. Joya Gaskins order. She is to rest on the couch for 30 min after taking. She was advised to call Dr Janace Aris office if her BP remained elevated for possible medication changes.   She verbalized understanding and will call back with any further questions.

## 2020-05-27 NOTE — Telephone Encounter (Signed)
Attempted to contact pt. No answer. Will attempt later today.

## 2020-05-27 NOTE — Telephone Encounter (Signed)
Yes

## 2020-05-29 NOTE — Progress Notes (Signed)
Cardiology Office Note:    Date:  05/30/2020   ID:  Kaitlin Villarreal, DOB Feb 07, 1926, MRN [id number]  PCP:  Angelina Sheriff, MD  Cardiologist:  Shirlee More, MD    Referring MD: Angelina Sheriff, MD    ASSESSMENT:    1. Essential hypertension   2. Stage 3 chronic kidney disease, unspecified whether stage 3a or 3b CKD   3. Chronic diastolic heart failure (HCC)    PLAN:    In order of problems listed above:  1. I am somewhat uncertain how she developed such severe hypertension and I am concerned about compliance with medications I think she tries to the best of her ability I told her she must use a pillbox we will go over instructions for her medications and continue the current regimen with the addition of lisinopril clonidine recheck her renal function with a history of CKD and see back in the office in a few weeks. 2. Recheck BMP 3. Well compensated continue current loop diuretic   Next appointment: 2 weeks we will ask her to consider visiting nurses   Medication Adjustments/Labs and Tests Ordered: Current medicines are reviewed at length with the patient today.  Concerns regarding medicines are outlined above.  Orders Placed This Encounter  Procedures  . EKG 12-Lead   No orders of the defined types were placed in this encounter.   Chief Complaint  Patient presents with  . Hypertension    Her lisinopril was increased by her PCP placed her on clonidine after phone call.  She called our office with systolics of 976 or greater    History of Present Illness:    Kaitlin Villarreal is a 86 y.o. female with a hx of  chronic diastolic heart failure, tension, hyperlipidemia, status post TAVR, status post pacemaker placement with a st. Jude dual chamber RA/RV device , hypertension with chronic kidney disease and post TAVR VSD  last seen 03/22/2020.  She is worked into office hours today after calls at the end of last week with uncontrolled hypertension and adjustment of medications  over the phone Compliance with diet, lifestyle and medications: I am uncertain she does not use a pillbox and just cannot tell me whether or not she is taken the clonidine that we prescribed.  She does not feel well she is unsteady she has a bruise on her left arm from hitting the door but has had no syncope no headache chest pain shortness of breath or edema Past Medical History:  Diagnosis Date  . Acute on chronic diastolic heart failure ([place]) 03/18/2018  . Aortic stenosis 10/14/2014   Formatting of this note might be different from the original.  moderate by echo 11/2015  . Arthritis   . Bilateral renal masses 07/26/2017  . Bone spur of toe of left foot   . Chronic diastolic (congestive) heart failure ([place])   . Chronic diastolic heart failure ([place]) 03/18/2018  . CKD (chronic kidney disease) stage 3, GFR 30-59 ml/min 07/26/2017  . Essential hypertension   . GERD (gastroesophageal reflux disease)   . HLD (hyperlipidemia)   . Hyperlipidemia 11/25/2016  . Pacemaker 09/02/2018  . Pancreatic lesion 04/17/2018  . Pancreatic mass    a. benign appearing but needs f/u, noted on pre TAVR CTs  . 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  Severe aortic stenosis    a. 03/2018: s/p TAVR by Burt Knack and Dr. Roxy Manns  . Stage 3 chronic kidney disease   . TIA (transient ischemic attack)     a. 1992  . VSD (ventricular septal defect)     Past Surgical History:  Procedure Laterality Date  . ABDOMINAL HYSTERECTOMY    . APPENDECTOMY    . HERNIA REPAIR    . INTRAOPERATIVE TRANSTHORACIC ECHOCARDIOGRAM N/A 03/18/2018   Procedure: INTRAOPERATIVE TRANSTHORACIC ECHOCARDIOGRAM;  Surgeon: Sherren Mocha, MD;  Location: [place];  Service: Open Heart Surgery;  Laterality: N/A;  . PACEMAKER IMPLANT N/A 03/20/2018   Procedure: PACEMAKER IMPLANT;  Surgeon:  Constance Haw, MD;  Location: [place] [facility];  Service: Cardiovascular;  Laterality: N/A;  . RIGHT HEART CATH N/A 03/20/2018   Procedure: RIGHT HEART CATH;  Surgeon: Sherren Mocha, MD;  Location: [place] [facility];  Service: Cardiovascular;  Laterality: N/A;  . RIGHT/LEFT HEART CATH AND CORONARY ANGIOGRAPHY N/A 02/06/2018   Procedure: RIGHT/LEFT HEART CATH AND CORONARY ANGIOGRAPHY;  Surgeon: Sherren Mocha, MD;  Location: [place] [facility];  Service: Cardiovascular;  Laterality: N/A;  . TONSILLECTOMY    . TRANSCATHETER AORTIC VALVE REPLACEMENT, TRANSFEMORAL N/A 03/18/2018   Procedure: TRANSCATHETER AORTIC VALVE REPLACEMENT, TRANSFEMORAL;  Surgeon: Sherren Mocha, MD;  Location: [place];  Service: Open Heart Surgery;  Laterality: N/A;  . WISDOM TOOTH EXTRACTION      Current Medications: Current Meds  Medication Sig  . acetaminophen (TYLENOL) 650 MG CR tablet Take 650-1,300 mg by mouth every 8 (eight) hours as needed for pain.  Marland Kitchen amoxicillin (AMOXIL) 500 MG tablet Take 4 tablets (2,000 mg total) by mouth as directed. 1 hour prior to dental work including cleanings  . aspirin EC 81 MG tablet Take 81 mg by mouth daily.   . cloNIDine (CATAPRES) 0.1 MG tablet Take 1 tablet (0.1 mg total) by mouth daily as needed. Only take if your systolic blood pressure is greater than 180  . Cyanocobalamin (B-12) 2500 MCG TABS Take 1 tablet by mouth daily.  Marland Kitchen estradiol (ESTRACE) 1 MG tablet Take 0.5 mg by mouth daily.  . famotidine (PEPCID) 20 MG tablet Take 20 mg by mouth as needed for heartburn or indigestion.  . fluticasone (FLONASE) 50 MCG/ACT nasal spray Place 2 sprays into both nostrils daily.  . furosemide (LASIX) 20 MG tablet Take 20 mg by mouth daily.  Marland Kitchen ketoconazole (NIZORAL) 2 % shampoo Apply 2 application topically 3 (three) times a week.  . Liniments (BLUE-EMU SUPER STRENGTH EX) Apply 1 application topically 3 (three) times daily as needed (for neck pain.).  Marland Kitchen lisinopril (ZESTRIL) 10 MG  tablet Take 10 mg by mouth daily.  . metoprolol tartrate (LOPRESSOR) 50 MG tablet Take 1 tablet by mouth twice daily  . Multiple Vitamins-Minerals (MULTI-VITAMIN GUMMIES) CHEW Chew by mouth. CHEW 2 GUMMIES DAILY  . Omega-3 Fatty Acids (FISH OIL) 1000 MG CAPS Take 1,000 mg by mouth daily.  Vladimir Faster Glycol-Propyl Glycol (SYSTANE OP) Place 1 drop into both eyes daily as needed (for dry eyes).  . pravastatin (PRAVACHOL) 20 MG tablet Take 20 mg by mouth at bedtime.   . Probiotic Product (CVS PROBIOTIC) CHEW Chew 2 each by mouth daily after supper.   . triamcinolone cream (KENALOG) 0.5 % Apply 3 application topically 3 (three) times a week.  . Vitamin D, Ergocalciferol, (DRISDOL) 1.25 MG (50000 UT) CAPS capsule TAKE 1 CAPSULE BY MOUTH 2 TIMES A WEEK     Allergies:  Levofloxacin, Azithromycin, Clarithromycin, and Latex   Social History   Socioeconomic History  . Marital status: Married    Spouse name: Not on file  . Number of children: Not on file  . Years of education: Not on file  . Highest education level: Not on file  Occupational History  . Not on file  Tobacco Use  . Smoking status: Never Smoker  . Smokeless tobacco: Never Used  Vaping Use  . Vaping Use: Never used  Substance and Sexual Activity  . Alcohol use: No  . Drug use: No  . Sexual activity: Not on file  Other Topics Concern  . Not on file  Social History Narrative  . Not on file   Social Determinants of Health   Financial Resource Strain:   . Difficulty of Paying Living Expenses: Not on file  Food Insecurity:   . Worried About Charity fundraiser in the Last Year: Not on file  . Ran Out of Food in the Last Year: Not on file  Transportation Needs:   . Lack of Transportation (Medical): Not on file  . Lack of Transportation (Non-Medical): Not on file  Physical Activity:   . Days of Exercise per Week: Not on file  . Minutes of Exercise per Session: Not on file  Stress:   . Feeling of Stress : Not on file    Social Connections:   . Frequency of Communication with Friends and Family: Not on file  . Frequency of Social Gatherings with Friends and Family: Not on file  . Attends Religious Services: Not on file  . Active Member of Clubs or Organizations: Not on file  . Attends Archivist Meetings: Not on file  . Marital Status: Not on file     Family History: The patient's family history includes Congenital heart disease in her brother; Heart attack in her brother; Heart disease in her mother; Uterine cancer in her sister. ROS:   Please see the history of present illness.    All other systems reviewed and are negative.  EKGs/Labs/Other Studies Reviewed:    The following studies were reviewed today:  EKG:  EKG ordered today and personally reviewed.  The ekg ordered today demonstrates sinus rhythm APCs otherwise normal  Recent Labs: 03/22/2020: BUN 28; Creatinine, Ser 1.26; NT-Pro BNP 1,445; Potassium 4.5; Sodium 141  Recent Lipid Panel    Component Value Date/Time   CHOL 229 (H) 02/06/2019 1140   TRIG 164 (H) 02/06/2019 1140   HDL 90 02/06/2019 1140   CHOLHDL 2.5 02/06/2019 1140   LDLCALC 106 (H) 02/06/2019 1140    Physical Exam:    VS:  BP (!) 200/70   Pulse 74   Ht 5' (1.524 m)   Wt 115 lb 3.2 oz (52.3 kg)   SpO2 98%   BMI 22.50 kg/m     Wt Readings from Last 3 Encounters:  05/30/20 115 lb 3.2 oz (52.3 kg)  03/22/20 116 lb 9.6 oz (52.9 kg)  12/21/19 121 lb (54.9 kg)     GEN: Frail forgetful well nourished, well developed in no acute distress HEENT: Normal NECK: No JVD; No carotid bruits LYMPHATICS: No lymphadenopathy CARDIAC: RRR, no murmurs, rubs, gallops RESPIRATORY:  Clear to auscultation without rales, wheezing or rhonchi  ABDOMEN: Soft, non-tender, non-distended MUSCULOSKELETAL:  No edema; No deformity  SKIN: Warm and dry NEUROLOGIC:  Alert and oriented x 3 PSYCHIATRIC:  Normal affect    Signed, Shirlee More, MD  05/30/2020 12:17 PM  [facility] stenosis sees nephrologist kidney disease is is

## 2020-05-30 ENCOUNTER — Ambulatory Visit (INDEPENDENT_AMBULATORY_CARE_PROVIDER_SITE_OTHER): Payer: Medicare Other | Admitting: Cardiology

## 2020-05-30 ENCOUNTER — Other Ambulatory Visit: Payer: Self-pay

## 2020-05-30 ENCOUNTER — Encounter: Payer: Self-pay | Admitting: Cardiology

## 2020-05-30 VITALS — BP 200/70 | HR 74 | Ht 60.0 in | Wt 115.2 lb

## 2020-05-30 DIAGNOSIS — I5032 Chronic diastolic (congestive) heart failure: Secondary | ICD-10-CM | POA: Diagnosis not present

## 2020-05-30 DIAGNOSIS — I10 Essential (primary) hypertension: Secondary | ICD-10-CM

## 2020-05-30 DIAGNOSIS — N183 Chronic kidney disease, stage 3 unspecified: Secondary | ICD-10-CM | POA: Diagnosis not present

## 2020-05-30 MED ORDER — CLONIDINE HCL 0.1 MG PO TABS: 0.1000 mg | ORAL_TABLET | Freq: Every day | ORAL | 3 refills | Status: DC

## 2020-05-30 NOTE — Patient Instructions (Signed)
Medication Instructions:  Your physician recommends that you continue on your current medications as directed. Please refer to the Current Medication list given to you today.  Please use a pill box to keep track of your medications. Take your clonidine at bedtime. Take in the morning if your blood pressure is greater than 180 on top.  *If you need a refill on your cardiac medications before your next appointment, please call your pharmacy*   Lab Work: Your physician recommends that you return for lab work in: TODAY BMP If you have labs (blood work) drawn today and your tests are completely normal, you will receive your results only by: Marland Kitchen MyChart Message (if you have MyChart) OR . A paper copy in the mail If you have any lab test that is abnormal or we need to change your treatment, we will call you to review the results.   Testing/Procedures: None   Follow-Up: At [facility], you and your health needs are our priority.  As part of our continuing mission to provide you with exceptional heart care, we have created designated Provider Care Teams.  These Care Teams include your primary Cardiologist (physician) and Advanced Practice Providers (APPs -  Physician Assistants and Nurse Practitioners) who all work together to provide you with the care you need, when you need it.  We recommend signing up for the patient portal called "MyChart".  Sign up information is provided on this After Visit Summary.  MyChart is used to connect with patients for Virtual Visits (Telemedicine).  Patients are able to view lab/test results, encounter notes, upcoming appointments, etc.  Non-urgent messages can be sent to your provider as well.   To learn more about what you can do with MyChart, go to [email or web address].    Your next appointment:   2 week(s)  The format for your next appointment:   In Person  Provider:   Shirlee More, MD   Other Instructions

## 2020-05-31 ENCOUNTER — Telehealth: Payer: Self-pay

## 2020-05-31 LAB — BASIC METABOLIC PANEL
BUN/Creatinine Ratio: 22 (ref 12–28)
BUN: 26 mg/dL (ref 10–36)
CO2: 26 mmol/L (ref 20–29)
Calcium: 9.8 mg/dL (ref 8.7–10.3)
Chloride: 103 mmol/L (ref 96–106)
Creatinine, Ser: 1.16 mg/dL — ABNORMAL HIGH (ref 0.57–1.00)
GFR calc Af Amer: 48 mL/min/{1.73_m2} — ABNORMAL LOW (ref >59)
GFR calc non Af Amer: 41 mL/min/{1.73_m2} — ABNORMAL LOW (ref >59)
Glucose: 87 mg/dL (ref 65–99)
Potassium: 4.2 mmol/L (ref 3.5–5.2)
Sodium: 144 mmol/L (ref 134–144)

## 2020-05-31 NOTE — Telephone Encounter (Signed)
Spoke with patient regarding results and recommendation.  Patient verbalizes understanding and is agreeable to plan of care. Advised patient to call back with any issues or concerns.  

## 2020-05-31 NOTE — Telephone Encounter (Signed)
-----   Message from Richardo Priest, MD sent at 05/31/2020  7:54 AM EDT ----- Stable no changes

## 2020-06-04 DIAGNOSIS — I700 Atherosclerosis of aorta: Secondary | ICD-10-CM | POA: Diagnosis not present

## 2020-06-04 DIAGNOSIS — N1831 Chronic kidney disease, stage 3a: Secondary | ICD-10-CM | POA: Diagnosis not present

## 2020-06-04 DIAGNOSIS — M1990 Unspecified osteoarthritis, unspecified site: Secondary | ICD-10-CM | POA: Diagnosis not present

## 2020-06-04 DIAGNOSIS — R52 Pain, unspecified: Secondary | ICD-10-CM | POA: Diagnosis not present

## 2020-06-04 DIAGNOSIS — R42 Dizziness and giddiness: Secondary | ICD-10-CM | POA: Diagnosis not present

## 2020-06-04 DIAGNOSIS — Z79899 Other long term (current) drug therapy: Secondary | ICD-10-CM | POA: Diagnosis not present

## 2020-06-04 DIAGNOSIS — Z950 Presence of cardiac pacemaker: Secondary | ICD-10-CM | POA: Diagnosis not present

## 2020-06-04 DIAGNOSIS — I129 Hypertensive chronic kidney disease with stage 1 through stage 4 chronic kidney disease, or unspecified chronic kidney disease: Secondary | ICD-10-CM | POA: Diagnosis not present

## 2020-06-04 DIAGNOSIS — Z7982 Long term (current) use of aspirin: Secondary | ICD-10-CM | POA: Diagnosis not present

## 2020-06-04 DIAGNOSIS — I10 Essential (primary) hypertension: Secondary | ICD-10-CM | POA: Diagnosis not present

## 2020-06-04 DIAGNOSIS — M549 Dorsalgia, unspecified: Secondary | ICD-10-CM | POA: Diagnosis not present

## 2020-06-08 ENCOUNTER — Emergency Department (HOSPITAL_COMMUNITY)
Admission: EM | Admit: 2020-06-08 | Discharge: 2020-06-09 | Disposition: A | Discharge status: Home / Self Care | Payer: Medicare Other | Attending: Emergency Medicine | Admitting: Emergency Medicine

## 2020-06-08 ENCOUNTER — Other Ambulatory Visit: Payer: Self-pay

## 2020-06-08 ENCOUNTER — Emergency Department (HOSPITAL_COMMUNITY): Payer: Medicare Other

## 2020-06-08 DIAGNOSIS — I517 Cardiomegaly: Secondary | ICD-10-CM | POA: Diagnosis not present

## 2020-06-08 DIAGNOSIS — Z7982 Long term (current) use of aspirin: Secondary | ICD-10-CM | POA: Diagnosis not present

## 2020-06-08 DIAGNOSIS — N183 Chronic kidney disease, stage 3 unspecified: Secondary | ICD-10-CM | POA: Diagnosis not present

## 2020-06-08 DIAGNOSIS — R0789 Other chest pain: Secondary | ICD-10-CM | POA: Diagnosis not present

## 2020-06-08 DIAGNOSIS — Z91040 Latex allergy status: Secondary | ICD-10-CM | POA: Diagnosis not present

## 2020-06-08 DIAGNOSIS — Z79899 Other long term (current) drug therapy: Secondary | ICD-10-CM | POA: Insufficient documentation

## 2020-06-08 DIAGNOSIS — I5033 Acute on chronic diastolic (congestive) heart failure: Secondary | ICD-10-CM | POA: Insufficient documentation

## 2020-06-08 DIAGNOSIS — I491 Atrial premature depolarization: Secondary | ICD-10-CM | POA: Diagnosis not present

## 2020-06-08 DIAGNOSIS — I130 Hypertensive heart and chronic kidney disease with heart failure and stage 1 through stage 4 chronic kidney disease, or unspecified chronic kidney disease: Secondary | ICD-10-CM | POA: Diagnosis not present

## 2020-06-08 DIAGNOSIS — I10 Essential (primary) hypertension: Secondary | ICD-10-CM | POA: Diagnosis not present

## 2020-06-08 DIAGNOSIS — R079 Chest pain, unspecified: Secondary | ICD-10-CM | POA: Diagnosis not present

## 2020-06-08 LAB — CBC WITH DIFFERENTIAL/PLATELET
Abs Immature Granulocytes: 0.02 10*3/uL (ref 0.00–0.07)
Basophils Absolute: 0 10*3/uL (ref 0.0–0.1)
Basophils Relative: 0 %
Eosinophils Absolute: 0.1 10*3/uL (ref 0.0–0.5)
Eosinophils Relative: 2 %
HCT: 32.2 % — ABNORMAL LOW (ref 36.0–46.0)
Hemoglobin: 10 g/dL — ABNORMAL LOW (ref 12.0–15.0)
Immature Granulocytes: 0 %
Lymphocytes Relative: 34 %
Lymphs Abs: 1.5 10*3/uL (ref 0.7–4.0)
MCH: 30.3 pg (ref 26.0–34.0)
MCHC: 31.1 g/dL (ref 30.0–36.0)
MCV: 97.6 fL (ref 80.0–100.0)
Monocytes Absolute: 0.4 10*3/uL (ref 0.1–1.0)
Monocytes Relative: 9 %
Neutro Abs: 2.4 10*3/uL (ref 1.7–7.7)
Neutrophils Relative %: 55 %
Platelets: 177 10*3/uL (ref 150–400)
RBC: 3.3 MIL/uL — ABNORMAL LOW (ref 3.87–5.11)
RDW: 13 % (ref 11.5–15.5)
WBC: 4.5 10*3/uL (ref 4.0–10.5)
nRBC: 0 % (ref 0.0–0.2)

## 2020-06-08 MED ORDER — HYDRALAZINE HCL 25 MG PO TABS
25.0000 mg | ORAL_TABLET | Freq: Once | ORAL | Status: AC
  Administered 2020-06-08: 25 mg via ORAL
  Filled 2020-06-08: qty 1

## 2020-06-08 NOTE — ED Provider Notes (Signed)
_ [facility] Provider Note   CSN: [id number] Arrival date & time: 06/08/20  2131     History Chief Complaint  Patient presents with  . Chest Pain    Kaitlin Villarreal is a 86 y.o. female history CHF, CKD, GERD, hyperlipidemia, hypertension, aortic stenosis, TAVR, VSD, pacemaker.  Patient presents today for concern of hypertension.  She reports she has been having elevated blood pressures for the last few weeks has been in discussion with her cardiologist in attempt to lower her blood pressure. Recently added Lisinopril and Clonidine.  Patient reports that around 8 PM today she was laying down in bed to try to go to sleep, she developed a fluttering sensation around the center of her chest symptoms were constant for a few minutes gradually improved, moderate intensity, nonradiating, denies similar in the past.  Her husband took her blood pressure at that time noted systolic to be greater than 220 mmHg, EMS was called patient was brought to the ER.  She received full dose aspirin prior to arrival.  At this time patient denies any symptoms reports she is feeling well.  She would like help with her blood pressure control.  Denies recent illness, fever/chills, headache, vision changes, shortness of breath, cough/hemoptysis, abdominal pain, nausea/vomiting, diaphoresis, extremity swelling/color change or any additional concerns.  Patient reports compliance with all of her home medications, except reports she did not take pravastatin at night before bed. HPI     Past Medical History:  Diagnosis Date  . Acute on chronic diastolic heart failure ([place]) 03/18/2018  . Aortic stenosis 10/14/2014   Formatting of this note might be different from the original.  moderate by echo 11/2015  . Arthritis   . Bilateral renal masses 07/26/2017  . Bone spur of toe of left foot   . Chronic diastolic (congestive) heart failure ([place])   . Chronic diastolic heart failure ([place])  03/18/2018  . CKD (chronic kidney disease) stage 3, GFR 30-59 ml/min 07/26/2017  . Essential hypertension   . GERD (gastroesophageal reflux disease)   . HLD (hyperlipidemia)   . Hyperlipidemia 11/25/2016  . Pacemaker 09/02/2018  . Pancreatic lesion 04/17/2018  . Pancreatic mass    a. benign appearing but needs f/u, noted on pre TAVR CTs  . 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 Severe aortic stenosis    a. 03/2018: s/p TAVR by Burt Knack and Dr. Roxy Manns  . Stage 3 chronic kidney disease   . TIA (transient ischemic attack)     a. 1992  . VSD (ventricular septal defect)     Patient Active Problem List   Diagnosis Date Noted  . Pacemaker 09/02/2018  . Pancreatic lesion 04/17/2018  . VSD (ventricular septal defect)   . Severe aortic stenosis 03/18/2018  . Chronic diastolic heart failure ([place]) 03/18/2018  . 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 Pancreatic mass   . CKD (chronic kidney disease) stage 3, GFR 30-59 ml/min 07/26/2017  . Bilateral renal masses 07/26/2017  . Stage 3 chronic kidney disease ([place]) 11/27/2016  . Essential hypertension 11/25/2016  . Hyperlipidemia 11/25/2016  . Aortic stenosis 10/14/2014    Past Surgical History:  Procedure Laterality Date  . ABDOMINAL HYSTERECTOMY    . APPENDECTOMY    . HERNIA REPAIR    . INTRAOPERATIVE TRANSTHORACIC ECHOCARDIOGRAM  N/A 03/18/2018   Procedure: INTRAOPERATIVE TRANSTHORACIC ECHOCARDIOGRAM;  Surgeon: Sherren Mocha, MD;  Location: [place];  Service: Open Heart Surgery;  Laterality: N/A;  . PACEMAKER IMPLANT N/A 03/20/2018   Procedure: PACEMAKER IMPLANT;  Surgeon: Constance Haw, MD;  Location: [place] [facility];  Service: Cardiovascular;  Laterality: N/A;  . RIGHT HEART CATH N/A 03/20/2018   Procedure: RIGHT HEART CATH;  Surgeon: Sherren Mocha, MD;  Location: [place] [facility];  Service: Cardiovascular;  Laterality: N/A;  . RIGHT/LEFT HEART CATH AND CORONARY ANGIOGRAPHY N/A 02/06/2018   Procedure: RIGHT/LEFT HEART CATH AND CORONARY ANGIOGRAPHY;  Surgeon: Sherren Mocha, MD;  Location: [place] [facility];  Service: Cardiovascular;  Laterality: N/A;  . TONSILLECTOMY    . TRANSCATHETER AORTIC VALVE REPLACEMENT, TRANSFEMORAL N/A 03/18/2018   Procedure: TRANSCATHETER AORTIC VALVE REPLACEMENT, TRANSFEMORAL;  Surgeon: Sherren Mocha, MD;  Location: [place];  Service: Open Heart Surgery;  Laterality: N/A;  . WISDOM TOOTH EXTRACTION       OB History   No obstetric history on file.     Family History  Problem Relation Age of Onset  . Heart disease Mother   . Uterine cancer Sister   . Congenital heart disease Brother   . Heart attack Brother     Social History   Tobacco Use  . Smoking status: Never Smoker  . Smokeless tobacco: Never Used  Vaping Use  . Vaping Use: Never used  Substance Use Topics  . Alcohol use: No  . Drug use: No    Home Medications Prior to Admission medications   Medication Sig Start Date End Date Taking? Authorizing Provider  acetaminophen (TYLENOL) 650 MG CR tablet Take 650-1,300 mg by mouth every 8 (eight) hours as needed for pain.    [provider]  amoxicillin (AMOXIL) 500 MG tablet Take 4 tablets (2,000 mg total) by mouth as directed. 1 hour prior to dental work including cleanings 01/29/19   Eileen Stanford, PA-C  aspirin EC 81 MG tablet Take 81 mg by mouth daily.     [provider]  cloNIDine (CATAPRES) 0.1 MG tablet Take 1 tablet (0.1 mg total) by mouth at bedtime. Only take if your systolic blood pressure is greater than 180 05/30/20   Munley, Hilton Cork, MD  Cyanocobalamin (B-12) 2500 MCG TABS Take 1 tablet by mouth daily.    [provider]  estradiol (ESTRACE) 1 MG tablet Take 0.5 mg by mouth daily.    [provider]  famotidine (PEPCID) 20 MG  tablet Take 20 mg by mouth as needed for heartburn or indigestion.    [provider]  fluticasone (FLONASE) 50 MCG/ACT nasal spray Place 2 sprays into both nostrils daily. 11/24/19   [provider]  furosemide (LASIX) 20 MG tablet Take 20 mg by mouth daily.    [provider]  ketoconazole (NIZORAL) 2 % shampoo Apply 2 application topically 3 (three) times a week. 02/18/20   [provider]  Liniments (BLUE-EMU SUPER STRENGTH EX) Apply 1 application topically 3 (three) times daily as needed (for neck pain.).    [provider]  lisinopril (ZESTRIL) 10 MG tablet Take 10 mg by mouth daily.    [provider]  metoprolol tartrate (LOPRESSOR) 50 MG tablet Take 1 tablet by mouth twice daily 03/02/20   Bettina Gavia, Hilton Cork, MD  Multiple Vitamins-Minerals (MULTI-VITAMIN GUMMIES) CHEW Chew by mouth. CHEW 2 [place]    [provider]  Omega-3 Fatty Acids (FISH OIL) 1000  MG CAPS Take 1,000 mg by mouth daily.    [provider]  Polyethyl Glycol-Propyl Glycol (SYSTANE OP) Place 1 drop into both eyes daily as needed (for dry eyes).    [provider]  pravastatin (PRAVACHOL) 20 MG tablet Take 20 mg by mouth at bedtime.     [provider]  Probiotic Product (CVS PROBIOTIC) CHEW Chew 2 each by mouth daily after supper.     [provider]  triamcinolone cream (KENALOG) 0.5 % Apply 3 application topically 3 (three) times a week. 02/22/20   [provider]  Vitamin D, Ergocalciferol, (DRISDOL) 1.25 MG (50000 UT) CAPS capsule TAKE 1 CAPSULE BY MOUTH 2 TIMES A WEEK 08/25/18   [provider]    Allergies    Levofloxacin, Azithromycin, Clarithromycin, and Latex  Review of Systems   Review of Systems Ten systems are reviewed and are negative for acute change except as noted in the HPI   Physical Exam Updated Vital Signs BP (!) 219/72 (BP Location: Right Arm)   Pulse 76   Temp (!) 97.5 F (36.4  C) (Oral)   Resp (!) 25   Ht 5' (1.524 m)   Wt 52.2 kg   SpO2 98%   BMI 22.46 kg/m   Physical Exam Constitutional:      General: She is not in acute distress.    Appearance: Normal appearance. She is well-developed. She is not ill-appearing or diaphoretic.  HENT:     Head: Normocephalic and atraumatic.  Eyes:     General: Vision grossly intact. Gaze aligned appropriately.     Pupils: Pupils are equal, round, and reactive to light.  Neck:     Trachea: Trachea and phonation normal.  Cardiovascular:     Rate and Rhythm: Normal rate and regular rhythm.  Pulmonary:     Effort: Pulmonary effort is normal. No respiratory distress.     Breath sounds: Normal breath sounds.  Abdominal:     General: There is no distension.     Palpations: Abdomen is soft.     Tenderness: There is no abdominal tenderness. There is no guarding or rebound.  Musculoskeletal:        General: Normal range of motion.     Cervical back: Normal range of motion.     Right lower leg: No tenderness. No edema.     Left lower leg: No tenderness. No edema.  Skin:    General: Skin is warm and dry.  Neurological:     Mental Status: She is alert.     GCS: GCS eye subscore is 4. GCS verbal subscore is 5. GCS motor subscore is 6.     Comments: Speech is clear and goal oriented, follows commands Major Cranial nerves without deficit, no facial droop Moves extremities without ataxia, coordination intact  Psychiatric:        Behavior: Behavior normal.     ED Results / Procedures / Treatments   Labs (all labs ordered are listed, but only abnormal results are displayed) Labs Reviewed - No data to display  EKG None  Radiology No results found.  Procedures Procedures (including critical care time)  Medications Ordered in ED Medications - No data to display  ED Course  I have reviewed the triage vital signs and the nursing notes.  Pertinent labs & imaging results that were available during my care of the  patient were reviewed by me and considered in my medical decision making (see chart for details).  MDM Rules/Calculators/A&P                          Additional history obtained from: 1. Nursing notes from this visit. 2. Electronic medical record system. ------------------------------ Patient currently asymptomatic, resting comfortably in no acute distress.  Blood pressure is elevated but similar to prior at her cardiologist office visit on May 31, 2020.  Will obtain chest pain labs, chest x-ray and EKG.  Discussed case with Dr. Roslynn Amble, will start with hydralazine 25 mg p.o. and monitor patient.  CXR:  IMPRESSION:  No acute abnormality. Stable mild cardiomegaly.     Patient reassessed resting comfortably no acute distress.  Remains on monitor.  Care handoff given to Charlann Lange PA-C at shift change.  Plan of care is to follow-up on blood work, all currently pending.  Disposition per oncoming team.  Note: Portions of this report may have been transcribed using voice recognition software. Every effort was made to ensure accuracy; however, inadvertent computerized transcription errors may still be present. Final Clinical Impression(s) / ED Diagnoses Final diagnoses:  None    Rx / DC Orders ED Discharge Orders    None       Gari Crown 06/08/20 2332    Lucrezia Starch, MD 06/09/20 [phone]

## 2020-06-08 NOTE — ED Triage Notes (Signed)
BIB [facility] after husband reported pt having c/o chest pain. PT was given 324 mg of aspirin enroute. PT has hx of HTN , heart valve replacement, and has pacemaker.   Vitals upon arrival:  HR 76 98% RA RR 25 B/P 219/72

## 2020-06-09 DIAGNOSIS — I130 Hypertensive heart and chronic kidney disease with heart failure and stage 1 through stage 4 chronic kidney disease, or unspecified chronic kidney disease: Secondary | ICD-10-CM | POA: Diagnosis not present

## 2020-06-09 LAB — TROPONIN I (HIGH SENSITIVITY)
Troponin I (High Sensitivity): 12 ng/L (ref <18)
Troponin I (High Sensitivity): 13 ng/L (ref <18)

## 2020-06-09 LAB — BASIC METABOLIC PANEL
Anion gap: 9 (ref 5–15)
BUN: 27 mg/dL — ABNORMAL HIGH (ref 8–23)
CO2: 25 mmol/L (ref 22–32)
Calcium: 9.1 mg/dL (ref 8.9–10.3)
Chloride: 105 mmol/L (ref 98–111)
Creatinine, Ser: 1.09 mg/dL — ABNORMAL HIGH (ref 0.44–1.00)
GFR calc Af Amer: 51 mL/min — ABNORMAL LOW (ref >60)
GFR calc non Af Amer: 44 mL/min — ABNORMAL LOW (ref >60)
Glucose, Bld: 140 mg/dL — ABNORMAL HIGH (ref 70–99)
Potassium: 4.1 mmol/L (ref 3.5–5.1)
Sodium: 139 mmol/L (ref 135–145)

## 2020-06-09 LAB — BRAIN NATRIURETIC PEPTIDE: B Natriuretic Peptide: 472.4 pg/mL — ABNORMAL HIGH (ref 0.0–100.0)

## 2020-06-09 MED ORDER — CLONIDINE HCL 0.1 MG PO TABS
0.1000 mg | ORAL_TABLET | Freq: Once | ORAL | Status: AC
  Administered 2020-06-09: 0.1 mg via ORAL
  Filled 2020-06-09: qty 1

## 2020-06-09 MED ORDER — ACETAMINOPHEN 325 MG PO TABS
650.0000 mg | ORAL_TABLET | Freq: Once | ORAL | Status: AC
  Administered 2020-06-09: 650 mg via ORAL
  Filled 2020-06-09: qty 2

## 2020-06-09 NOTE — ED Provider Notes (Signed)
Patient care signed out at end of shift by Nuala Alpha, PA-C, and Dr. Roslynn Amble pending delta troponin and observation for hypertension.   Her repeat trop is 13, no uptrending, normal.   On re-evaluation, blood pressure was improved over presenting pressure of 219/72 to 181/52 after oral hydralazine. She complains of headache that is posterior and like previous headaches. Tylenol ordered. Will continue treating elevated pressure with her home regimen. Clonidine 0.1 mg provided.   She is felt appropriate for discharge home. Her 89-yo husband will not be able to drive to pick her up until morning. Will observe her BP in the interim. Will strongly encourage f/u with her cardiologist for any recommendations on medications changes.   Charlann Lange, PA-C 06/10/20 [id number]    Lucrezia Starch, MD 06/10/20 2233

## 2020-06-09 NOTE — ED Notes (Signed)
Called patient husband [phone]

## 2020-06-09 NOTE — ED Notes (Signed)
RN attempted to call spouse- no answer

## 2020-06-09 NOTE — Discharge Instructions (Signed)
Please contact your cardiologist for an appointment to review your difficult to control blood pressure. To stress his recommendation, take your Clonidine at bedtime every night, and if your blood pressure is 180 or greater in the morning, take it in the morning as well. Continue all other medications.  Please return to the emergency department any time you feel you need urgent evaluation.

## 2020-06-13 NOTE — Progress Notes (Signed)
Cardiology Office Note:    Date:  06/14/2020   ID:  Kaitlin Villarreal, DOB 1926-08-09, MRN [id number]  PCP:  Angelina Sheriff, MD  Cardiologist:  Shirlee More, MD    Referring MD: Angelina Sheriff, MD    ASSESSMENT:    1. Essential hypertension   2. Stage 3 chronic kidney disease, unspecified whether stage 3a or 3b CKD   3. Chronic diastolic heart failure ([place])   4. S/P TAVR (transcatheter aortic valve replacement)   5. Pacemaker    PLAN:    In order of problems listed above:  1. Her primary problem poorly controlled hypertension I am worried about compliance and I finally convinced him to accept a pillbox and her husband to supervise medications.  I think a precipitant here is stopping her diuretic we will put her on a low-dose of a distal diuretic recheck renal function in a week. 2. Heart failure is compensated not on loop diuretic 3. Stable after aortic valve replacement pacemaker continue to follow in our device clinic   Next appointment: 2 to 3 weeks with her husband   Medication Adjustments/Labs and Tests Ordered: Current medicines are reviewed at length with the patient today.  Concerns regarding medicines are outlined above.  Orders Placed This Encounter  Procedures  . Basic metabolic panel   Meds ordered this encounter  Medications  . eplerenone (INSPRA) 25 MG tablet    Sig: Take 1 tablet (25 mg total) by mouth daily.    Dispense:  90 tablet    Refill:  3    Chief Complaint  Patient presents with  . Hypertension    Poorly controlled with ED visits    History of Present Illness:    Kaitlin Villarreal is a 86 y.o. female with a hx of  chronic diastolic heart failure, hypertension, hyperlipidemia, status post TAVR, status post pacemaker placement with a st. Jude dual chamber RA/RV device , hypertension with chronic kidney disease and post TAVR VSD last seen 05/30/2020.  She was seen [facility] ED 06/08/2020 with poorly controlled hypertension blood  pressure recorded 219/72 I independently reviewed the EKG from that visit showing sinus rhythm and left ventricular hypertrophy.  Last visit with me there seemed to be confusion about medications. Compliance with diet, lifestyle and medications: Yes  Fortunately her husband is present participate in evaluation and decision making.  Her blood pressures are improved typically in the 160/70 range now however one morning she needed to take a dose of clonidine for systolic greater than 774.  I told him I was puzzled what is happened here she is not using a pillbox and I promised to use 1 and her husband supervise her medications.  Unknown to me she had stopped taking a diuretic and I think that is what caused uncontrolled hypertension.  Her creatinine is minimally elevated potassium is normal Minna put her on a small dose of a loop diuretic continue to monitor blood pressure recheck renal function a week and see her back in my office.  Is a better visit last time she saw me she could not really tell me what medicine she was taking. Past Medical History:  Diagnosis Date  . Acute on chronic diastolic heart failure ([place]) 03/18/2018  . Aortic stenosis 10/14/2014   Formatting of this note might be different from the original.  moderate by echo 11/2015  . Arthritis   . Bilateral renal masses 07/26/2017  . Bone spur of toe of left  foot   . Chronic diastolic (congestive) heart failure ([place])   . Chronic diastolic heart failure ([place]) 03/18/2018  . CKD (chronic kidney disease) stage 3, GFR 30-59 ml/min 07/26/2017  . Essential hypertension   . GERD (gastroesophageal reflux disease)   . HLD (hyperlipidemia)   . Hyperlipidemia 11/25/2016  . Pacemaker 09/02/2018  . Pancreatic lesion 04/17/2018  . Pancreatic mass    a. benign appearing but needs f/u, noted on pre TAVR CTs  . 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 S/P TAVR (transcatheter aortic valve replacement) 03/18/2018   23 mm Edwards Sapien 3 transcatheter heart  valve placed via percutaneous right transfemoral approach   . Severe aortic stenosis    a. 03/2018: s/p TAVR by Burt Knack and Dr. Roxy Manns  . Stage 3 chronic kidney disease   . TIA (transient ischemic attack)     a. 1992  . VSD (ventricular septal defect)     Past Surgical History:  Procedure Laterality Date  . ABDOMINAL HYSTERECTOMY    . APPENDECTOMY    . HERNIA REPAIR    . INTRAOPERATIVE TRANSTHORACIC ECHOCARDIOGRAM N/A 03/18/2018   Procedure: INTRAOPERATIVE TRANSTHORACIC ECHOCARDIOGRAM;  Surgeon: Sherren Mocha, MD;  Location: [place];  Service: Open Heart Surgery;  Laterality: N/A;  . PACEMAKER IMPLANT N/A 03/20/2018   Procedure: PACEMAKER IMPLANT;  Surgeon: Constance Haw, MD;  Location: [place] [facility];  Service: Cardiovascular;  Laterality: N/A;  . RIGHT HEART CATH N/A 03/20/2018   Procedure: RIGHT HEART CATH;  Surgeon: Sherren Mocha, MD;  Location: [place] [facility];  Service: Cardiovascular;  Laterality: N/A;  . RIGHT/LEFT HEART CATH AND CORONARY ANGIOGRAPHY N/A 02/06/2018   Procedure: RIGHT/LEFT HEART CATH AND CORONARY ANGIOGRAPHY;  Surgeon: Sherren Mocha, MD;  Location: [place] [facility];  Service: Cardiovascular;  Laterality: N/A;  . TONSILLECTOMY    . TRANSCATHETER AORTIC VALVE REPLACEMENT, TRANSFEMORAL N/A 03/18/2018   Procedure: TRANSCATHETER AORTIC VALVE REPLACEMENT, TRANSFEMORAL;  Surgeon: Sherren Mocha, MD;  Location: [place];  Service: Open Heart Surgery;  Laterality: N/A;  . WISDOM TOOTH EXTRACTION      Current Medications: Current Meds  Medication Sig  . acetaminophen (TYLENOL) 650 MG CR tablet Take 650-1,300 mg by mouth every 8 (eight) hours as needed for pain.  Marland Kitchen amoxicillin (AMOXIL) 500 MG tablet Take 4 tablets (2,000 mg total) by mouth as directed. 1 hour prior to dental work including cleanings (Patient taking differently: Take 2,000 mg by mouth See admin instructions. Take 2,000 mg by mouth 1 hour prior to dental work, including cleanings)  . aspirin EC 81  MG tablet Take 81 mg by mouth daily.   . Carboxymethylcellulose Sod PF (THERATEARS PF) 0.25 % SOLN Place 1 drop into both eyes in the morning and at bedtime.  . cloNIDine (CATAPRES) 0.1 MG tablet Take 1 tablet (0.1 mg total) by mouth at bedtime. Only take if your systolic blood pressure is greater than 180 (Patient taking differently: Take 0.1 mg by mouth at bedtime. )  . Cyanocobalamin (B-12) 2500 MCG TABS Take 2,500 mcg by mouth daily.   Marland Kitchen estradiol (ESTRACE) 1 MG tablet Take 0.5 mg by mouth daily.  . famotidine (PEPCID) 20 MG tablet Take 20 mg by mouth as needed for heartburn or indigestion.  . fluticasone (FLONASE) 50 MCG/ACT nasal spray Place 2 sprays into both nostrils daily as needed for allergies or rhinitis.   . furosemide (LASIX) 20 MG tablet Take 20 mg by mouth daily as needed for fluid or edema (  and an additional 20 mg once daily if swelling or edema is still evident in early afternoon).   Marland Kitchen ketoconazole (NIZORAL) 2 % shampoo Apply 2 application topically 3 (three) times a week.  Marland Kitchen lisinopril (ZESTRIL) 20 MG tablet Take 20 mg by mouth daily.  . metoprolol tartrate (LOPRESSOR) 50 MG tablet Take 1 tablet by mouth twice daily (Patient taking differently: Take 50 mg by mouth 2 (two) times daily. )  . Multiple Vitamins-Minerals (MULTI-VITAMIN GUMMIES) CHEW Chew 2 tablets by mouth daily.   . Omega-3 Fatty Acids (FISH OIL) 1000 MG CAPS Take 1,000 mg by mouth daily.  Vladimir Faster Glycol-Propyl Glycol (SYSTANE OP) Place 1 drop into both eyes See admin instructions. Instill 1 drop into both eyes two times a day when not using TheraTears  . pravastatin (PRAVACHOL) 20 MG tablet Take 20 mg by mouth at bedtime.   . Probiotic Product ([employer] PROBIOTIC) CHEW Chew 2 each by mouth daily after supper.   . triamcinolone cream (KENALOG) 0.5 % Apply 3 application topically 3 (three) times daily as needed (for itching- AFFECTED AREAS).   . Vitamin D, Ergocalciferol, (DRISDOL) 1.25 MG (50000 UT) CAPS capsule Take  50,000 Units by mouth See admin instructions. Take 50,000 units by mouth 2 times a week- Mondays and Fridays     Allergies:   Clarithromycin, Latex, Levofloxacin, and Azithromycin   Social History   Socioeconomic History  . Marital status: Married    Spouse name: Not on file  . Number of children: Not on file  . Years of education: Not on file  . Highest education level: Not on file  Occupational History  . Not on file  Tobacco Use  . Smoking status: Never Smoker  . Smokeless tobacco: Never Used  Vaping Use  . Vaping Use: Never used  Substance and Sexual Activity  . Alcohol use: No  . Drug use: No  . Sexual activity: Not on file  Other Topics Concern  . Not on file  Social History Narrative  . Not on file   Social Determinants of Health   Financial Resource Strain:   . Difficulty of Paying Living Expenses: Not on file  Food Insecurity:   . Worried About Charity fundraiser in the Last Year: Not on file  . Ran Out of Food in the Last Year: Not on file  Transportation Needs:   . Lack of Transportation (Medical): Not on file  . Lack of Transportation (Non-Medical): Not on file  Physical Activity:   . Days of Exercise per Week: Not on file  . Minutes of Exercise per Session: Not on file  Stress:   . Feeling of Stress : Not on file  Social Connections:   . Frequency of Communication with Friends and Family: Not on file  . Frequency of Social Gatherings with Friends and Family: Not on file  . Attends Religious Services: Not on file  . Active Member of Clubs or Organizations: Not on file  . Attends Archivist Meetings: Not on file  . Marital Status: Not on file     Family History: The patient's family history includes Congenital heart disease in her brother; Heart attack in her brother; Heart disease in her mother; Uterine cancer in her sister. ROS:   Please see the history of present illness.    All other systems reviewed and are  negative.  EKGs/Labs/Other Studies Reviewed:    The following studies were reviewed today  Recent Labs: 03/22/2020: NT-Pro BNP 1,445  06/08/2020: B Natriuretic Peptide 472.4; BUN 27; Creatinine, Ser 1.09; Hemoglobin 10.0; Platelets 177; Potassium 4.1; Sodium 139  Recent Lipid Panel    Component Value Date/Time   CHOL 229 (H) 02/06/2019 1140   TRIG 164 (H) 02/06/2019 1140   HDL 90 02/06/2019 1140   CHOLHDL 2.5 02/06/2019 1140   LDLCALC 106 (H) 02/06/2019 1140    Physical Exam:    VS:  BP (!) 183/51   Pulse 60   Ht 5\' 1"  (1.549 m)   Wt 113 lb 9.6 oz (51.5 kg)   SpO2 97%   BMI 21.46 kg/m     Wt Readings from Last 3 Encounters:  06/14/20 113 lb 9.6 oz (51.5 kg)  06/08/20 115 lb (52.2 kg)  05/30/20 115 lb 3.2 oz (52.3 kg)     GEN: She looks frail well nourished, well developed in no acute distress HEENT: Normal NECK: No JVD; No carotid bruits LYMPHATICS: No lymphadenopathy CARDIAC: RRR, no murmurs, rubs, gallops RESPIRATORY:  Clear to auscultation without rales, wheezing or rhonchi  ABDOMEN: Soft, non-tender, non-distended MUSCULOSKELETAL:  No edema; No deformity  SKIN: Warm and dry NEUROLOGIC:  Alert and oriented x 3 PSYCHIATRIC:  Normal affect    Signed, Shirlee More, MD  06/14/2020 10:54 AM    [place]

## 2020-06-14 ENCOUNTER — Encounter: Payer: Self-pay | Admitting: Cardiology

## 2020-06-14 ENCOUNTER — Other Ambulatory Visit: Payer: Self-pay

## 2020-06-14 ENCOUNTER — Ambulatory Visit (INDEPENDENT_AMBULATORY_CARE_PROVIDER_SITE_OTHER): Payer: Medicare Other | Admitting: Cardiology

## 2020-06-14 VITALS — BP 183/51 | HR 60 | Ht 61.0 in | Wt 113.6 lb

## 2020-06-14 DIAGNOSIS — I5032 Chronic diastolic (congestive) heart failure: Secondary | ICD-10-CM | POA: Diagnosis not present

## 2020-06-14 DIAGNOSIS — N183 Chronic kidney disease, stage 3 unspecified: Secondary | ICD-10-CM

## 2020-06-14 DIAGNOSIS — I10 Essential (primary) hypertension: Secondary | ICD-10-CM | POA: Diagnosis not present

## 2020-06-14 DIAGNOSIS — Z950 Presence of cardiac pacemaker: Secondary | ICD-10-CM

## 2020-06-14 DIAGNOSIS — Z952 Presence of prosthetic heart valve: Secondary | ICD-10-CM | POA: Diagnosis not present

## 2020-06-14 MED ORDER — EPLERENONE 25 MG PO TABS: 25.0000 mg | ORAL_TABLET | Freq: Every day | ORAL | 3 refills | Status: DC

## 2020-06-14 NOTE — Patient Instructions (Signed)
Medication Instructions:  Your physician has recommended you make the following change in your medication:  START: Inspra 25 mg take one tablet by mouth daily.  *If you need a refill on your cardiac medications before your next appointment, please call your pharmacy*   Lab Work: Your physician recommends that you return for lab work in: 1 WEEK BMP If you have labs (blood work) drawn today and your tests are completely normal, you will receive your results only by: Marland Kitchen MyChart Message (if you have MyChart) OR . A paper copy in the mail If you have any lab test that is abnormal or we need to change your treatment, we will call you to review the results.   Testing/Procedures: nONE   Follow-Up: At [facility], you and your health needs are our priority.  As part of our continuing mission to provide you with exceptional heart care, we have created designated Provider Care Teams.  These Care Teams include your primary Cardiologist (physician) and Advanced Practice Providers (APPs -  Physician Assistants and Nurse Practitioners) who all work together to provide you with the care you need, when you need it.  We recommend signing up for the patient portal called "MyChart".  Sign up information is provided on this After Visit Summary.  MyChart is used to connect with patients for Virtual Visits (Telemedicine).  Patients are able to view lab/test results, encounter notes, upcoming appointments, etc.  Non-urgent messages can be sent to your provider as well.   To learn more about what you can do with MyChart, go to [email or web address].    Your next appointment:   3 week(s)  The format for your next appointment:   In Person  Provider:   Shirlee More, MD   Other Instructions Please record your blood pressure at home and write these recordings down. Do this twice daily.

## 2020-06-20 ENCOUNTER — Telehealth: Payer: Self-pay | Admitting: Cardiology

## 2020-06-20 DIAGNOSIS — G4489 Other headache syndrome: Secondary | ICD-10-CM | POA: Diagnosis not present

## 2020-06-20 DIAGNOSIS — I160 Hypertensive urgency: Secondary | ICD-10-CM | POA: Diagnosis not present

## 2020-06-20 DIAGNOSIS — I674 Hypertensive encephalopathy: Secondary | ICD-10-CM | POA: Diagnosis not present

## 2020-06-20 DIAGNOSIS — Z950 Presence of cardiac pacemaker: Secondary | ICD-10-CM | POA: Diagnosis not present

## 2020-06-20 DIAGNOSIS — Z952 Presence of prosthetic heart valve: Secondary | ICD-10-CM | POA: Diagnosis not present

## 2020-06-20 DIAGNOSIS — Z881 Allergy status to other antibiotic agents status: Secondary | ICD-10-CM | POA: Diagnosis not present

## 2020-06-20 DIAGNOSIS — R0902 Hypoxemia: Secondary | ICD-10-CM | POA: Diagnosis not present

## 2020-06-20 DIAGNOSIS — I6389 Other cerebral infarction: Secondary | ICD-10-CM | POA: Diagnosis not present

## 2020-06-20 DIAGNOSIS — Z9089 Acquired absence of other organs: Secondary | ICD-10-CM | POA: Diagnosis not present

## 2020-06-20 DIAGNOSIS — G459 Transient cerebral ischemic attack, unspecified: Secondary | ICD-10-CM | POA: Insufficient documentation

## 2020-06-20 DIAGNOSIS — M1990 Unspecified osteoarthritis, unspecified site: Secondary | ICD-10-CM | POA: Diagnosis present

## 2020-06-20 DIAGNOSIS — R531 Weakness: Secondary | ICD-10-CM | POA: Diagnosis not present

## 2020-06-20 DIAGNOSIS — E785 Hyperlipidemia, unspecified: Secondary | ICD-10-CM | POA: Diagnosis present

## 2020-06-20 DIAGNOSIS — N189 Chronic kidney disease, unspecified: Secondary | ICD-10-CM | POA: Diagnosis not present

## 2020-06-20 DIAGNOSIS — I6782 Cerebral ischemia: Secondary | ICD-10-CM | POA: Diagnosis not present

## 2020-06-20 DIAGNOSIS — R7989 Other specified abnormal findings of blood chemistry: Secondary | ICD-10-CM | POA: Diagnosis not present

## 2020-06-20 DIAGNOSIS — I350 Nonrheumatic aortic (valve) stenosis: Secondary | ICD-10-CM | POA: Diagnosis not present

## 2020-06-20 DIAGNOSIS — R269 Unspecified abnormalities of gait and mobility: Secondary | ICD-10-CM | POA: Diagnosis not present

## 2020-06-20 DIAGNOSIS — I129 Hypertensive chronic kidney disease with stage 1 through stage 4 chronic kidney disease, or unspecified chronic kidney disease: Secondary | ICD-10-CM | POA: Diagnosis not present

## 2020-06-20 DIAGNOSIS — I442 Atrioventricular block, complete: Secondary | ICD-10-CM | POA: Diagnosis not present

## 2020-06-20 DIAGNOSIS — I700 Atherosclerosis of aorta: Secondary | ICD-10-CM | POA: Diagnosis not present

## 2020-06-20 DIAGNOSIS — N183 Chronic kidney disease, stage 3 unspecified: Secondary | ICD-10-CM | POA: Diagnosis present

## 2020-06-20 DIAGNOSIS — I10 Essential (primary) hypertension: Secondary | ICD-10-CM | POA: Diagnosis not present

## 2020-06-20 DIAGNOSIS — Z79899 Other long term (current) drug therapy: Secondary | ICD-10-CM | POA: Diagnosis not present

## 2020-06-20 DIAGNOSIS — M7752 Other enthesopathy of left foot: Secondary | ICD-10-CM | POA: Insufficient documentation

## 2020-06-20 DIAGNOSIS — M216X2 Other acquired deformities of left foot: Secondary | ICD-10-CM | POA: Insufficient documentation

## 2020-06-20 DIAGNOSIS — K219 Gastro-esophageal reflux disease without esophagitis: Secondary | ICD-10-CM | POA: Insufficient documentation

## 2020-06-20 DIAGNOSIS — Z91040 Latex allergy status: Secondary | ICD-10-CM | POA: Diagnosis not present

## 2020-06-20 DIAGNOSIS — I5032 Chronic diastolic (congestive) heart failure: Secondary | ICD-10-CM | POA: Insufficient documentation

## 2020-06-20 DIAGNOSIS — Z7982 Long term (current) use of aspirin: Secondary | ICD-10-CM | POA: Diagnosis not present

## 2020-06-20 NOTE — Telephone Encounter (Signed)
Pt c/o BP issue: STAT if pt c/o blurred vision, one-sided weakness or slurred speech  1. What are your last 5 BP readings?   September 19th: 9AM 150/62, 4PM 171/59, 7:30PM 190/72  September 20th: 201/72  2. Are you having any other symptoms (ex. Dizziness, headache, blurred vision, passed out)? Headaches and more tired  3. What is your BP issue? BP is high   Scheduled patient with Dr. Bettina Gavia for tomorrow 06/21/20 at 3:20pm, patient's husband was requesting an appt

## 2020-06-20 NOTE — Telephone Encounter (Signed)
Spoke with the pt and she is reporting feeling very bad since yesterday. She says she is so weak that she cannot keep her head up, dizzy, headache, and feels like she can pass out. She denies weakness or paralysis on one side, did not sound to have slurred speech but sounded very weak. She denies any facial drooping. She is able to ambulate with help from her husband but I advised her to stay were she is so she did not fall.   Her BP has been:   September 19th: 9AM 150/62, 4PM 171/59, 7:30PM 190/72  September 20th: 201/72  I asked if she felt comfortable calling EMS and she says she did and will have her husband call for her now for immediate assessment.

## 2020-06-21 ENCOUNTER — Telehealth: Payer: Self-pay | Admitting: Cardiology

## 2020-06-21 ENCOUNTER — Ambulatory Visit (INDEPENDENT_AMBULATORY_CARE_PROVIDER_SITE_OTHER): Payer: Medicare Other | Admitting: *Deleted

## 2020-06-21 ENCOUNTER — Ambulatory Visit: Payer: Medicare Other | Admitting: Cardiology

## 2020-06-21 DIAGNOSIS — I674 Hypertensive encephalopathy: Secondary | ICD-10-CM | POA: Diagnosis not present

## 2020-06-21 DIAGNOSIS — E785 Hyperlipidemia, unspecified: Secondary | ICD-10-CM | POA: Diagnosis not present

## 2020-06-21 DIAGNOSIS — N183 Chronic kidney disease, stage 3 unspecified: Secondary | ICD-10-CM | POA: Diagnosis not present

## 2020-06-21 DIAGNOSIS — I442 Atrioventricular block, complete: Secondary | ICD-10-CM | POA: Diagnosis not present

## 2020-06-21 LAB — CUP PACEART REMOTE DEVICE CHECK
Battery Remaining Longevity: 130 mo
Battery Remaining Percentage: 95.5 %
Battery Voltage: 3.01 V
Brady Statistic AP VP Percent: 1 %
Brady Statistic AP VS Percent: 14 %
Brady Statistic AS VP Percent: 1 %
Brady Statistic AS VS Percent: 85 %
Brady Statistic RA Percent Paced: 14 %
Brady Statistic RV Percent Paced: 1 %
Date Time Interrogation Session: 20210921162956
Implantable Lead Implant Date: 20190620
Implantable Lead Implant Date: 20190620
Implantable Lead Location: 753859
Implantable Lead Location: 753860
Implantable Pulse Generator Implant Date: 20190620
Lead Channel Impedance Value: 390 Ohm
Lead Channel Impedance Value: 480 Ohm
Lead Channel Pacing Threshold Amplitude: 0.75 V
Lead Channel Pacing Threshold Amplitude: 0.75 V
Lead Channel Pacing Threshold Pulse Width: 0.5 ms
Lead Channel Pacing Threshold Pulse Width: 0.5 ms
Lead Channel Sensing Intrinsic Amplitude: 11 mV
Lead Channel Sensing Intrinsic Amplitude: 5 mV
Lead Channel Setting Pacing Amplitude: 1 V
Lead Channel Setting Pacing Amplitude: 2 V
Lead Channel Setting Pacing Pulse Width: 0.5 ms
Lead Channel Setting Sensing Sensitivity: 2 mV
Pulse Gen Model: 2272
Pulse Gen Serial Number: 9035930

## 2020-06-21 NOTE — Telephone Encounter (Signed)
Message has been forwarded to Dr. Bettina Gavia to make him aware.

## 2020-06-21 NOTE — Telephone Encounter (Signed)
Called and made the family aware.

## 2020-06-21 NOTE — Telephone Encounter (Signed)
New Message:    Daughter wants Dr Bettina Gavia to know that the pt is still in the hospital and she would like for him to please go by and see her.

## 2020-06-21 NOTE — Telephone Encounter (Signed)
I am not in the hospital today and I would not will not be going over there they should speak with the attending physician the hospitalist and if they need cardiology to see the patient consultation would be appropriate

## 2020-06-21 NOTE — Telephone Encounter (Signed)
    Kaitlin Villarreal calling back, she said pt is back home but wondering if pt can be seen sooner than 07/04/2020. If not she wanted to know any recommendation what they need to do while waiting on 10/04

## 2020-06-22 ENCOUNTER — Ambulatory Visit: Payer: Medicare Other | Admitting: Cardiology

## 2020-06-24 NOTE — Progress Notes (Signed)
Remote pacemaker transmission.   

## 2020-06-27 DIAGNOSIS — N189 Chronic kidney disease, unspecified: Secondary | ICD-10-CM | POA: Diagnosis not present

## 2020-06-27 DIAGNOSIS — I10 Essential (primary) hypertension: Secondary | ICD-10-CM | POA: Diagnosis not present

## 2020-06-27 DIAGNOSIS — Z6821 Body mass index (BMI) 21.0-21.9, adult: Secondary | ICD-10-CM | POA: Diagnosis not present

## 2020-06-27 DIAGNOSIS — Z79899 Other long term (current) drug therapy: Secondary | ICD-10-CM | POA: Diagnosis not present

## 2020-06-27 DIAGNOSIS — Z23 Encounter for immunization: Secondary | ICD-10-CM | POA: Diagnosis not present

## 2020-07-04 ENCOUNTER — Other Ambulatory Visit: Payer: Self-pay

## 2020-07-04 ENCOUNTER — Ambulatory Visit (INDEPENDENT_AMBULATORY_CARE_PROVIDER_SITE_OTHER): Payer: Medicare Other | Admitting: Cardiology

## 2020-07-04 ENCOUNTER — Encounter: Payer: Self-pay | Admitting: Cardiology

## 2020-07-04 VITALS — BP 153/55 | HR 72 | Ht 61.0 in | Wt 110.0 lb

## 2020-07-04 DIAGNOSIS — I130 Hypertensive heart and chronic kidney disease with heart failure and stage 1 through stage 4 chronic kidney disease, or unspecified chronic kidney disease: Secondary | ICD-10-CM

## 2020-07-04 DIAGNOSIS — I5032 Chronic diastolic (congestive) heart failure: Secondary | ICD-10-CM

## 2020-07-04 DIAGNOSIS — N183 Chronic kidney disease, stage 3 unspecified: Secondary | ICD-10-CM | POA: Diagnosis not present

## 2020-07-04 NOTE — Progress Notes (Signed)
Cardiology Office Note:    Date:  07/04/2020   ID:  Kaitlin Villarreal, DOB 1925/11/11, MRN [id number]  PCP:  Angelina Sheriff, MD  Cardiologist:  Shirlee More, MD    Referring MD: Angelina Sheriff, MD    ASSESSMENT:    1. 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 Chronic diastolic heart failure (HCC)    PLAN:    In order of problems listed above:  1. In her case age 86+ I think her blood pressure is now well controlled and in retrospect I think much of this was noncompliance with medications and manage health care at home.  I continue her current multidrug regimen heart failure is compensated her blood pressure is in reasonable range.   Next appointment: 3 months   Medication Adjustments/Labs and Tests Ordered: Current medicines are reviewed at length with the patient today.  Concerns regarding medicines are outlined above.  No orders of the defined types were placed in this encounter.  No orders of the defined types were placed in this encounter.   No chief complaint on file.   History of Present Illness:    Kaitlin Villarreal is a 86 y.o. female with a hx of aortic stenosis TAVR permanent pacemaker following TAVR hypertensive heart disease with chronic kidney disease last seen 06/14/2020.  At the last visit it became obvious that she had stopped taking a diuretic is concerned about compliance with medications.  Compliance with diet, lifestyle and medications: Yes  Fortunately she is now using a pillbox for compliance her husband is participating in her care with medications.  She is taking her diuretic daily.  Her home blood pressures have been very much in range typically running in the 1 44-6 60 range systolic she feels better.  They are concerned that this was all initiated by reaction to the [place] booster I do not think so.  She acknowledges she is very anxious and apprehensive.  One occasion she had to take  hydralazine because of elevated blood  She has had 2 recent hospitalizations with hypertension which did not require any significant change in her medical regimen to achieve control. Past Medical History:  Diagnosis Date  . Acute on chronic diastolic heart failure ([place]) 03/18/2018  . Aortic stenosis 10/14/2014   Formatting of this note might be different from the original.  moderate by echo 11/2015  . Arthritis   . Bilateral renal masses 07/26/2017  . Bone spur of toe of left foot   . Chronic diastolic (congestive) heart failure ([place])   . Chronic diastolic heart failure ([place]) 03/18/2018  . CKD (chronic kidney disease) stage 3, GFR 30-59 ml/min (HCC) 07/26/2017  . Essential hypertension   . GERD (gastroesophageal reflux disease)   . HLD (hyperlipidemia)   . Hyperlipidemia 11/25/2016  . Pacemaker 09/02/2018  . Pancreatic lesion 04/17/2018  . Pancreatic mass    a. benign appearing but needs f/u, noted on pre TAVR CTs  . 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 Severe aortic stenosis    a. 03/2018: s/p TAVR by Burt Knack and Dr. Roxy Manns  . Stage 3 chronic kidney disease ([place])   . TIA (transient ischemic attack)     a. 1992  . VSD (ventricular septal defect)  Past Surgical History:  Procedure Laterality Date  . ABDOMINAL HYSTERECTOMY    . APPENDECTOMY    . HERNIA REPAIR    . INTRAOPERATIVE TRANSTHORACIC ECHOCARDIOGRAM N/A 03/18/2018   Procedure: INTRAOPERATIVE TRANSTHORACIC ECHOCARDIOGRAM;  Surgeon: Sherren Mocha, MD;  Location: [place];  Service: Open Heart Surgery;  Laterality: N/A;  . PACEMAKER IMPLANT N/A 03/20/2018   Procedure: PACEMAKER IMPLANT;  Surgeon: Constance Haw, MD;  Location: [place] [facility];  Service: Cardiovascular;  Laterality: N/A;  . RIGHT HEART CATH N/A 03/20/2018   Procedure: RIGHT HEART CATH;   Surgeon: Sherren Mocha, MD;  Location: [place] [facility];  Service: Cardiovascular;  Laterality: N/A;  . RIGHT/LEFT HEART CATH AND CORONARY ANGIOGRAPHY N/A 02/06/2018   Procedure: RIGHT/LEFT HEART CATH AND CORONARY ANGIOGRAPHY;  Surgeon: Sherren Mocha, MD;  Location: [place] [facility];  Service: Cardiovascular;  Laterality: N/A;  . TONSILLECTOMY    . TRANSCATHETER AORTIC VALVE REPLACEMENT, TRANSFEMORAL N/A 03/18/2018   Procedure: TRANSCATHETER AORTIC VALVE REPLACEMENT, TRANSFEMORAL;  Surgeon: Sherren Mocha, MD;  Location: [place];  Service: Open Heart Surgery;  Laterality: N/A;  . WISDOM TOOTH EXTRACTION      Current Medications: Current Meds  Medication Sig  . acetaminophen (TYLENOL) 650 MG CR tablet Take 650-1,300 mg by mouth every 8 (eight) hours as needed for pain.  Marland Kitchen amoxicillin (AMOXIL) 500 MG tablet Take 4 tablets (2,000 mg total) by mouth as directed. 1 hour prior to dental work including cleanings (Patient taking differently: Take 2,000 mg by mouth See admin instructions. Take 2,000 mg by mouth 1 hour prior to dental work, including cleanings)  . aspirin EC 81 MG tablet Take 81 mg by mouth daily.   . Carboxymethylcellulose Sod PF (THERATEARS PF) 0.25 % SOLN Place 1 drop into both eyes in the morning and at bedtime.  . Cyanocobalamin (B-12) 2500 MCG TABS Take 2,500 mcg by mouth daily.   Marland Kitchen eplerenone (INSPRA) 25 MG tablet Take 1 tablet (25 mg total) by mouth daily.  Marland Kitchen estradiol (ESTRACE) 1 MG tablet Take 0.5 mg by mouth daily.  . famotidine (PEPCID) 20 MG tablet Take 20 mg by mouth as needed for heartburn or indigestion.  . fluticasone (FLONASE) 50 MCG/ACT nasal spray Place 2 sprays into both nostrils daily as needed for allergies or rhinitis.   . furosemide (LASIX) 20 MG tablet Take 20 mg by mouth daily as needed for fluid or edema (and an additional 20 mg once daily if swelling or edema is still evident in early afternoon).   . hydrALAZINE (APRESOLINE) 25 MG tablet Take 25 mg by  mouth 3 (three) times daily as needed. When BP over 180  . lisinopril (ZESTRIL) 20 MG tablet Take 20 mg by mouth daily.  . metoprolol tartrate (LOPRESSOR) 50 MG tablet Take 1 tablet by mouth twice daily (Patient taking differently: Take 50 mg by mouth 2 (two) times daily. )  . Multiple Vitamins-Minerals (MULTI-VITAMIN GUMMIES) CHEW Chew 2 tablets by mouth daily.   . Omega-3 Fatty Acids (FISH OIL) 1000 MG CAPS Take 1,000 mg by mouth daily.  Vladimir Faster Glycol-Propyl Glycol (SYSTANE OP) Place 1 drop into both eyes See admin instructions. Instill 1 drop into both eyes two times a day when not using TheraTears  . pravastatin (PRAVACHOL) 20 MG tablet Take 20 mg by mouth at bedtime.   . Probiotic Product (CVS PROBIOTIC) CHEW Chew 2 each by mouth daily after supper.   . triamcinolone cream (KENALOG) 0.5 % Apply 3 application topically 3 (three)  times daily as needed (for itching- AFFECTED AREAS).   . Vitamin D, Ergocalciferol, (DRISDOL) 1.25 MG (50000 UT) CAPS capsule Take 50,000 Units by mouth See admin instructions. Take 50,000 units by mouth 2 times a week- Mondays and Fridays     Allergies:   Clarithromycin, Latex, Levofloxacin, and Azithromycin   Social History   Socioeconomic History  . Marital status: Married    Spouse name: Not on file  . Number of children: Not on file  . Years of education: Not on file  . Highest education level: Not on file  Occupational History  . Not on file  Tobacco Use  . Smoking status: Never Smoker  . Smokeless tobacco: Never Used  Vaping Use  . Vaping Use: Never used  Substance and Sexual Activity  . Alcohol use: No  . Drug use: No  . Sexual activity: Not on file  Other Topics Concern  . Not on file  Social History Narrative  . Not on file   Social Determinants of Health   Financial Resource Strain:   . Difficulty of Paying Living Expenses: Not on file  Food Insecurity:   . Worried About Charity fundraiser in the Last Year: Not on file  . Ran  Out of Food in the Last Year: Not on file  Transportation Needs:   . Lack of Transportation (Medical): Not on file  . Lack of Transportation (Non-Medical): Not on file  Physical Activity:   . Days of Exercise per Week: Not on file  . Minutes of Exercise per Session: Not on file  Stress:   . Feeling of Stress : Not on file  Social Connections:   . Frequency of Communication with Friends and Family: Not on file  . Frequency of Social Gatherings with Friends and Family: Not on file  . Attends Religious Services: Not on file  . Active Member of Clubs or Organizations: Not on file  . Attends Archivist Meetings: Not on file  . Marital Status: Not on file     Family History: The patient's family history includes Congenital heart disease in her brother; Heart attack in her brother; Heart disease in her mother; Uterine cancer in her sister. ROS:   Please see the history of present illness.    All other systems reviewed and are negative.  EKGs/Labs/Other Studies Reviewed:    The following studies were reviewed today:    Recent Labs: 03/22/2020: NT-Pro BNP 1,445 06/08/2020: B Natriuretic Peptide 472.4; BUN 27; Creatinine, Ser 1.09; Hemoglobin 10.0; Platelets 177; Potassium 4.1; Sodium 139  Recent Lipid Panel    Component Value Date/Time   CHOL 229 (H) 02/06/2019 1140   TRIG 164 (H) 02/06/2019 1140   HDL 90 02/06/2019 1140   CHOLHDL 2.5 02/06/2019 1140   LDLCALC 106 (H) 02/06/2019 1140    Physical Exam:    VS:  BP (!) 153/55   Pulse 72   Ht 5\' 1"  (1.549 m)   Wt 110 lb (49.9 kg)   SpO2 98%   BMI 20.78 kg/m     Wt Readings from Last 3 Encounters:  07/04/20 110 lb (49.9 kg)  06/14/20 113 lb 9.6 oz (51.5 kg)  06/08/20 115 lb (52.2 kg)     GEN:  Well nourished, well developed in no acute distress HEENT: Normal NECK: No JVD; No carotid bruits LYMPHATICS: No lymphadenopathy CARDIAC: RRR, no murmurs, rubs, gallops RESPIRATORY:  Clear to auscultation without rales,  wheezing or rhonchi  ABDOMEN: Soft, non-tender, non-distended  MUSCULOSKELETAL:  No edema; No deformity  SKIN: Warm and dry NEUROLOGIC:  Alert and oriented x 3 PSYCHIATRIC:  Normal affect    Signed, Shirlee More, MD  07/04/2020 4:28 PM     [facility]

## 2020-07-04 NOTE — Patient Instructions (Signed)

## 2020-07-25 DIAGNOSIS — D649 Anemia, unspecified: Secondary | ICD-10-CM | POA: Diagnosis present

## 2020-07-25 DIAGNOSIS — Z23 Encounter for immunization: Secondary | ICD-10-CM | POA: Diagnosis not present

## 2020-07-25 DIAGNOSIS — R079 Chest pain, unspecified: Secondary | ICD-10-CM | POA: Diagnosis not present

## 2020-07-25 DIAGNOSIS — R2689 Other abnormalities of gait and mobility: Secondary | ICD-10-CM | POA: Diagnosis present

## 2020-07-25 DIAGNOSIS — M25461 Effusion, right knee: Secondary | ICD-10-CM | POA: Diagnosis not present

## 2020-07-25 DIAGNOSIS — M4312 Spondylolisthesis, cervical region: Secondary | ICD-10-CM | POA: Diagnosis not present

## 2020-07-25 DIAGNOSIS — Z79899 Other long term (current) drug therapy: Secondary | ICD-10-CM | POA: Diagnosis not present

## 2020-07-25 DIAGNOSIS — E860 Dehydration: Secondary | ICD-10-CM | POA: Diagnosis present

## 2020-07-25 DIAGNOSIS — S80211A Abrasion, right knee, initial encounter: Secondary | ICD-10-CM | POA: Diagnosis not present

## 2020-07-25 DIAGNOSIS — K5730 Diverticulosis of large intestine without perforation or abscess without bleeding: Secondary | ICD-10-CM | POA: Diagnosis not present

## 2020-07-25 DIAGNOSIS — N183 Chronic kidney disease, stage 3 unspecified: Secondary | ICD-10-CM | POA: Diagnosis present

## 2020-07-25 DIAGNOSIS — M159 Polyosteoarthritis, unspecified: Secondary | ICD-10-CM | POA: Diagnosis present

## 2020-07-25 DIAGNOSIS — M47814 Spondylosis without myelopathy or radiculopathy, thoracic region: Secondary | ICD-10-CM | POA: Diagnosis not present

## 2020-07-25 DIAGNOSIS — M1711 Unilateral primary osteoarthritis, right knee: Secondary | ICD-10-CM | POA: Diagnosis not present

## 2020-07-25 DIAGNOSIS — E785 Hyperlipidemia, unspecified: Secondary | ICD-10-CM | POA: Diagnosis present

## 2020-07-25 DIAGNOSIS — E7800 Pure hypercholesterolemia, unspecified: Secondary | ICD-10-CM | POA: Diagnosis present

## 2020-07-25 DIAGNOSIS — W19XXXA Unspecified fall, initial encounter: Secondary | ICD-10-CM | POA: Diagnosis not present

## 2020-07-25 DIAGNOSIS — M25561 Pain in right knee: Secondary | ICD-10-CM | POA: Diagnosis not present

## 2020-07-25 DIAGNOSIS — M25512 Pain in left shoulder: Secondary | ICD-10-CM | POA: Diagnosis present

## 2020-07-25 DIAGNOSIS — M47812 Spondylosis without myelopathy or radiculopathy, cervical region: Secondary | ICD-10-CM | POA: Diagnosis not present

## 2020-07-25 DIAGNOSIS — Z881 Allergy status to other antibiotic agents status: Secondary | ICD-10-CM | POA: Diagnosis not present

## 2020-07-25 DIAGNOSIS — M47816 Spondylosis without myelopathy or radiculopathy, lumbar region: Secondary | ICD-10-CM | POA: Diagnosis not present

## 2020-07-25 DIAGNOSIS — R531 Weakness: Secondary | ICD-10-CM | POA: Diagnosis present

## 2020-07-25 DIAGNOSIS — I129 Hypertensive chronic kidney disease with stage 1 through stage 4 chronic kidney disease, or unspecified chronic kidney disease: Secondary | ICD-10-CM | POA: Diagnosis present

## 2020-07-25 DIAGNOSIS — K828 Other specified diseases of gallbladder: Secondary | ICD-10-CM | POA: Diagnosis not present

## 2020-07-25 DIAGNOSIS — Z950 Presence of cardiac pacemaker: Secondary | ICD-10-CM | POA: Diagnosis not present

## 2020-07-25 DIAGNOSIS — R9082 White matter disease, unspecified: Secondary | ICD-10-CM | POA: Diagnosis not present

## 2020-07-25 DIAGNOSIS — M25562 Pain in left knee: Secondary | ICD-10-CM | POA: Diagnosis not present

## 2020-07-25 DIAGNOSIS — S80212A Abrasion, left knee, initial encounter: Secondary | ICD-10-CM | POA: Diagnosis not present

## 2020-07-25 DIAGNOSIS — R58 Hemorrhage, not elsewhere classified: Secondary | ICD-10-CM | POA: Diagnosis not present

## 2020-07-25 DIAGNOSIS — K449 Diaphragmatic hernia without obstruction or gangrene: Secondary | ICD-10-CM | POA: Diagnosis not present

## 2020-07-25 DIAGNOSIS — M79622 Pain in left upper arm: Secondary | ICD-10-CM | POA: Diagnosis not present

## 2020-07-25 DIAGNOSIS — S81011A Laceration without foreign body, right knee, initial encounter: Secondary | ICD-10-CM | POA: Diagnosis present

## 2020-07-25 DIAGNOSIS — M4316 Spondylolisthesis, lumbar region: Secondary | ICD-10-CM | POA: Diagnosis not present

## 2020-07-25 DIAGNOSIS — Z7982 Long term (current) use of aspirin: Secondary | ICD-10-CM | POA: Diagnosis not present

## 2020-07-25 DIAGNOSIS — R52 Pain, unspecified: Secondary | ICD-10-CM | POA: Diagnosis not present

## 2020-07-26 DIAGNOSIS — E785 Hyperlipidemia, unspecified: Secondary | ICD-10-CM | POA: Diagnosis not present

## 2020-07-26 DIAGNOSIS — E860 Dehydration: Secondary | ICD-10-CM | POA: Diagnosis not present

## 2020-07-26 DIAGNOSIS — S81011A Laceration without foreign body, right knee, initial encounter: Secondary | ICD-10-CM | POA: Diagnosis not present

## 2020-07-28 DIAGNOSIS — M159 Polyosteoarthritis, unspecified: Secondary | ICD-10-CM | POA: Diagnosis not present

## 2020-07-28 DIAGNOSIS — Z950 Presence of cardiac pacemaker: Secondary | ICD-10-CM | POA: Diagnosis not present

## 2020-07-28 DIAGNOSIS — S81011D Laceration without foreign body, right knee, subsequent encounter: Secondary | ICD-10-CM | POA: Diagnosis not present

## 2020-07-28 DIAGNOSIS — Z7982 Long term (current) use of aspirin: Secondary | ICD-10-CM | POA: Diagnosis not present

## 2020-07-28 DIAGNOSIS — R531 Weakness: Secondary | ICD-10-CM | POA: Diagnosis not present

## 2020-07-28 DIAGNOSIS — Z79899 Other long term (current) drug therapy: Secondary | ICD-10-CM | POA: Diagnosis not present

## 2020-07-28 DIAGNOSIS — R2689 Other abnormalities of gait and mobility: Secondary | ICD-10-CM | POA: Diagnosis not present

## 2020-07-28 DIAGNOSIS — E7800 Pure hypercholesterolemia, unspecified: Secondary | ICD-10-CM | POA: Diagnosis not present

## 2020-07-28 DIAGNOSIS — Z9181 History of falling: Secondary | ICD-10-CM | POA: Diagnosis not present

## 2020-07-28 DIAGNOSIS — I129 Hypertensive chronic kidney disease with stage 1 through stage 4 chronic kidney disease, or unspecified chronic kidney disease: Secondary | ICD-10-CM | POA: Diagnosis not present

## 2020-07-28 DIAGNOSIS — W101XXD Fall (on)(from) sidewalk curb, subsequent encounter: Secondary | ICD-10-CM | POA: Diagnosis not present

## 2020-07-28 DIAGNOSIS — N183 Chronic kidney disease, stage 3 unspecified: Secondary | ICD-10-CM | POA: Diagnosis not present

## 2020-08-01 DIAGNOSIS — E7800 Pure hypercholesterolemia, unspecified: Secondary | ICD-10-CM | POA: Diagnosis not present

## 2020-08-01 DIAGNOSIS — I10 Essential (primary) hypertension: Secondary | ICD-10-CM | POA: Diagnosis not present

## 2020-08-01 DIAGNOSIS — M159 Polyosteoarthritis, unspecified: Secondary | ICD-10-CM | POA: Diagnosis not present

## 2020-08-01 DIAGNOSIS — R932 Abnormal findings on diagnostic imaging of liver and biliary tract: Secondary | ICD-10-CM | POA: Diagnosis not present

## 2020-08-01 DIAGNOSIS — Z6821 Body mass index (BMI) 21.0-21.9, adult: Secondary | ICD-10-CM | POA: Diagnosis not present

## 2020-08-01 DIAGNOSIS — I129 Hypertensive chronic kidney disease with stage 1 through stage 4 chronic kidney disease, or unspecified chronic kidney disease: Secondary | ICD-10-CM | POA: Diagnosis not present

## 2020-08-01 DIAGNOSIS — Z9181 History of falling: Secondary | ICD-10-CM | POA: Diagnosis not present

## 2020-08-01 DIAGNOSIS — N183 Chronic kidney disease, stage 3 unspecified: Secondary | ICD-10-CM | POA: Diagnosis not present

## 2020-08-01 DIAGNOSIS — R531 Weakness: Secondary | ICD-10-CM | POA: Diagnosis not present

## 2020-08-01 DIAGNOSIS — S81011D Laceration without foreign body, right knee, subsequent encounter: Secondary | ICD-10-CM | POA: Diagnosis not present

## 2020-08-03 DIAGNOSIS — M159 Polyosteoarthritis, unspecified: Secondary | ICD-10-CM | POA: Diagnosis not present

## 2020-08-03 DIAGNOSIS — S81011D Laceration without foreign body, right knee, subsequent encounter: Secondary | ICD-10-CM | POA: Diagnosis not present

## 2020-08-03 DIAGNOSIS — R531 Weakness: Secondary | ICD-10-CM | POA: Diagnosis not present

## 2020-08-03 DIAGNOSIS — N183 Chronic kidney disease, stage 3 unspecified: Secondary | ICD-10-CM | POA: Diagnosis not present

## 2020-08-03 DIAGNOSIS — E7800 Pure hypercholesterolemia, unspecified: Secondary | ICD-10-CM | POA: Diagnosis not present

## 2020-08-03 DIAGNOSIS — I129 Hypertensive chronic kidney disease with stage 1 through stage 4 chronic kidney disease, or unspecified chronic kidney disease: Secondary | ICD-10-CM | POA: Diagnosis not present

## 2020-08-05 DIAGNOSIS — E7800 Pure hypercholesterolemia, unspecified: Secondary | ICD-10-CM | POA: Diagnosis not present

## 2020-08-05 DIAGNOSIS — N183 Chronic kidney disease, stage 3 unspecified: Secondary | ICD-10-CM | POA: Diagnosis not present

## 2020-08-05 DIAGNOSIS — M159 Polyosteoarthritis, unspecified: Secondary | ICD-10-CM | POA: Diagnosis not present

## 2020-08-05 DIAGNOSIS — S81011D Laceration without foreign body, right knee, subsequent encounter: Secondary | ICD-10-CM | POA: Diagnosis not present

## 2020-08-05 DIAGNOSIS — I129 Hypertensive chronic kidney disease with stage 1 through stage 4 chronic kidney disease, or unspecified chronic kidney disease: Secondary | ICD-10-CM | POA: Diagnosis not present

## 2020-08-05 DIAGNOSIS — R531 Weakness: Secondary | ICD-10-CM | POA: Diagnosis not present

## 2020-08-08 ENCOUNTER — Telehealth: Payer: Self-pay

## 2020-08-08 DIAGNOSIS — S81011D Laceration without foreign body, right knee, subsequent encounter: Secondary | ICD-10-CM | POA: Diagnosis not present

## 2020-08-08 DIAGNOSIS — N183 Chronic kidney disease, stage 3 unspecified: Secondary | ICD-10-CM | POA: Diagnosis not present

## 2020-08-08 DIAGNOSIS — E7800 Pure hypercholesterolemia, unspecified: Secondary | ICD-10-CM | POA: Diagnosis not present

## 2020-08-08 DIAGNOSIS — M159 Polyosteoarthritis, unspecified: Secondary | ICD-10-CM | POA: Diagnosis not present

## 2020-08-08 DIAGNOSIS — I129 Hypertensive chronic kidney disease with stage 1 through stage 4 chronic kidney disease, or unspecified chronic kidney disease: Secondary | ICD-10-CM | POA: Diagnosis not present

## 2020-08-08 DIAGNOSIS — R531 Weakness: Secondary | ICD-10-CM | POA: Diagnosis not present

## 2020-08-08 MED ORDER — LISINOPRIL 20 MG PO TABS
20.0000 mg | ORAL_TABLET | Freq: Every day | ORAL | 0 refills | Status: DC
Start: 2020-08-08 — End: 2020-09-12

## 2020-08-08 NOTE — Telephone Encounter (Signed)
Refill sent to pharmacy.   

## 2020-08-10 DIAGNOSIS — N183 Chronic kidney disease, stage 3 unspecified: Secondary | ICD-10-CM | POA: Diagnosis not present

## 2020-08-10 DIAGNOSIS — S81011D Laceration without foreign body, right knee, subsequent encounter: Secondary | ICD-10-CM | POA: Diagnosis not present

## 2020-08-10 DIAGNOSIS — M159 Polyosteoarthritis, unspecified: Secondary | ICD-10-CM | POA: Diagnosis not present

## 2020-08-10 DIAGNOSIS — E7800 Pure hypercholesterolemia, unspecified: Secondary | ICD-10-CM | POA: Diagnosis not present

## 2020-08-10 DIAGNOSIS — I129 Hypertensive chronic kidney disease with stage 1 through stage 4 chronic kidney disease, or unspecified chronic kidney disease: Secondary | ICD-10-CM | POA: Diagnosis not present

## 2020-08-10 DIAGNOSIS — R531 Weakness: Secondary | ICD-10-CM | POA: Diagnosis not present

## 2020-08-11 DIAGNOSIS — R531 Weakness: Secondary | ICD-10-CM | POA: Diagnosis not present

## 2020-08-11 DIAGNOSIS — I129 Hypertensive chronic kidney disease with stage 1 through stage 4 chronic kidney disease, or unspecified chronic kidney disease: Secondary | ICD-10-CM | POA: Diagnosis not present

## 2020-08-11 DIAGNOSIS — M159 Polyosteoarthritis, unspecified: Secondary | ICD-10-CM | POA: Diagnosis not present

## 2020-08-11 DIAGNOSIS — N183 Chronic kidney disease, stage 3 unspecified: Secondary | ICD-10-CM | POA: Diagnosis not present

## 2020-08-11 DIAGNOSIS — E7800 Pure hypercholesterolemia, unspecified: Secondary | ICD-10-CM | POA: Diagnosis not present

## 2020-08-11 DIAGNOSIS — S81011D Laceration without foreign body, right knee, subsequent encounter: Secondary | ICD-10-CM | POA: Diagnosis not present

## 2020-08-15 DIAGNOSIS — I129 Hypertensive chronic kidney disease with stage 1 through stage 4 chronic kidney disease, or unspecified chronic kidney disease: Secondary | ICD-10-CM | POA: Diagnosis not present

## 2020-08-15 DIAGNOSIS — M159 Polyosteoarthritis, unspecified: Secondary | ICD-10-CM | POA: Diagnosis not present

## 2020-08-15 DIAGNOSIS — S81011D Laceration without foreign body, right knee, subsequent encounter: Secondary | ICD-10-CM | POA: Diagnosis not present

## 2020-08-15 DIAGNOSIS — N183 Chronic kidney disease, stage 3 unspecified: Secondary | ICD-10-CM | POA: Diagnosis not present

## 2020-08-15 DIAGNOSIS — R531 Weakness: Secondary | ICD-10-CM | POA: Diagnosis not present

## 2020-08-15 DIAGNOSIS — E7800 Pure hypercholesterolemia, unspecified: Secondary | ICD-10-CM | POA: Diagnosis not present

## 2020-08-18 DIAGNOSIS — M159 Polyosteoarthritis, unspecified: Secondary | ICD-10-CM | POA: Diagnosis not present

## 2020-08-18 DIAGNOSIS — N183 Chronic kidney disease, stage 3 unspecified: Secondary | ICD-10-CM | POA: Diagnosis not present

## 2020-08-18 DIAGNOSIS — R531 Weakness: Secondary | ICD-10-CM | POA: Diagnosis not present

## 2020-08-18 DIAGNOSIS — I129 Hypertensive chronic kidney disease with stage 1 through stage 4 chronic kidney disease, or unspecified chronic kidney disease: Secondary | ICD-10-CM | POA: Diagnosis not present

## 2020-08-18 DIAGNOSIS — S81011D Laceration without foreign body, right knee, subsequent encounter: Secondary | ICD-10-CM | POA: Diagnosis not present

## 2020-08-18 DIAGNOSIS — E7800 Pure hypercholesterolemia, unspecified: Secondary | ICD-10-CM | POA: Diagnosis not present

## 2020-08-22 DIAGNOSIS — R531 Weakness: Secondary | ICD-10-CM | POA: Diagnosis not present

## 2020-08-22 DIAGNOSIS — S81011D Laceration without foreign body, right knee, subsequent encounter: Secondary | ICD-10-CM | POA: Diagnosis not present

## 2020-08-22 DIAGNOSIS — N183 Chronic kidney disease, stage 3 unspecified: Secondary | ICD-10-CM | POA: Diagnosis not present

## 2020-08-22 DIAGNOSIS — E7800 Pure hypercholesterolemia, unspecified: Secondary | ICD-10-CM | POA: Diagnosis not present

## 2020-08-22 DIAGNOSIS — M159 Polyosteoarthritis, unspecified: Secondary | ICD-10-CM | POA: Diagnosis not present

## 2020-08-22 DIAGNOSIS — I129 Hypertensive chronic kidney disease with stage 1 through stage 4 chronic kidney disease, or unspecified chronic kidney disease: Secondary | ICD-10-CM | POA: Diagnosis not present

## 2020-08-24 DIAGNOSIS — N183 Chronic kidney disease, stage 3 unspecified: Secondary | ICD-10-CM | POA: Diagnosis not present

## 2020-08-24 DIAGNOSIS — M159 Polyosteoarthritis, unspecified: Secondary | ICD-10-CM | POA: Diagnosis not present

## 2020-08-24 DIAGNOSIS — E7800 Pure hypercholesterolemia, unspecified: Secondary | ICD-10-CM | POA: Diagnosis not present

## 2020-08-24 DIAGNOSIS — I129 Hypertensive chronic kidney disease with stage 1 through stage 4 chronic kidney disease, or unspecified chronic kidney disease: Secondary | ICD-10-CM | POA: Diagnosis not present

## 2020-08-24 DIAGNOSIS — S81011D Laceration without foreign body, right knee, subsequent encounter: Secondary | ICD-10-CM | POA: Diagnosis not present

## 2020-08-24 DIAGNOSIS — R531 Weakness: Secondary | ICD-10-CM | POA: Diagnosis not present

## 2020-08-25 DIAGNOSIS — W101XXD Fall (on)(from) sidewalk curb, subsequent encounter: Secondary | ICD-10-CM | POA: Diagnosis not present

## 2020-08-25 DIAGNOSIS — D72829 Elevated white blood cell count, unspecified: Secondary | ICD-10-CM | POA: Diagnosis not present

## 2020-08-25 DIAGNOSIS — M159 Polyosteoarthritis, unspecified: Secondary | ICD-10-CM | POA: Diagnosis not present

## 2020-08-25 DIAGNOSIS — Z7982 Long term (current) use of aspirin: Secondary | ICD-10-CM | POA: Diagnosis not present

## 2020-08-25 DIAGNOSIS — M25561 Pain in right knee: Secondary | ICD-10-CM | POA: Diagnosis not present

## 2020-08-25 DIAGNOSIS — Z9181 History of falling: Secondary | ICD-10-CM | POA: Diagnosis not present

## 2020-08-25 DIAGNOSIS — W19XXXA Unspecified fall, initial encounter: Secondary | ICD-10-CM | POA: Diagnosis not present

## 2020-08-25 DIAGNOSIS — L03115 Cellulitis of right lower limb: Secondary | ICD-10-CM | POA: Diagnosis not present

## 2020-08-25 DIAGNOSIS — N183 Chronic kidney disease, stage 3 unspecified: Secondary | ICD-10-CM | POA: Diagnosis present

## 2020-08-25 DIAGNOSIS — R197 Diarrhea, unspecified: Secondary | ICD-10-CM | POA: Diagnosis present

## 2020-08-25 DIAGNOSIS — I10 Essential (primary) hypertension: Secondary | ICD-10-CM | POA: Diagnosis not present

## 2020-08-25 DIAGNOSIS — J90 Pleural effusion, not elsewhere classified: Secondary | ICD-10-CM | POA: Diagnosis not present

## 2020-08-25 DIAGNOSIS — S81011D Laceration without foreign body, right knee, subsequent encounter: Secondary | ICD-10-CM | POA: Diagnosis not present

## 2020-08-25 DIAGNOSIS — R531 Weakness: Secondary | ICD-10-CM | POA: Diagnosis not present

## 2020-08-25 DIAGNOSIS — A419 Sepsis, unspecified organism: Secondary | ICD-10-CM | POA: Diagnosis present

## 2020-08-25 DIAGNOSIS — D649 Anemia, unspecified: Secondary | ICD-10-CM | POA: Diagnosis present

## 2020-08-25 DIAGNOSIS — R2689 Other abnormalities of gait and mobility: Secondary | ICD-10-CM | POA: Diagnosis not present

## 2020-08-25 DIAGNOSIS — M1990 Unspecified osteoarthritis, unspecified site: Secondary | ICD-10-CM | POA: Diagnosis present

## 2020-08-25 DIAGNOSIS — D72825 Bandemia: Secondary | ICD-10-CM | POA: Diagnosis not present

## 2020-08-25 DIAGNOSIS — E7800 Pure hypercholesterolemia, unspecified: Secondary | ICD-10-CM | POA: Diagnosis not present

## 2020-08-25 DIAGNOSIS — R609 Edema, unspecified: Secondary | ICD-10-CM | POA: Diagnosis not present

## 2020-08-25 DIAGNOSIS — Z950 Presence of cardiac pacemaker: Secondary | ICD-10-CM | POA: Diagnosis not present

## 2020-08-25 DIAGNOSIS — I129 Hypertensive chronic kidney disease with stage 1 through stage 4 chronic kidney disease, or unspecified chronic kidney disease: Secondary | ICD-10-CM | POA: Diagnosis present

## 2020-08-25 DIAGNOSIS — E785 Hyperlipidemia, unspecified: Secondary | ICD-10-CM | POA: Diagnosis present

## 2020-08-25 DIAGNOSIS — Z79899 Other long term (current) drug therapy: Secondary | ICD-10-CM | POA: Diagnosis not present

## 2020-08-25 DIAGNOSIS — M7989 Other specified soft tissue disorders: Secondary | ICD-10-CM | POA: Diagnosis not present

## 2020-08-25 DIAGNOSIS — R509 Fever, unspecified: Secondary | ICD-10-CM | POA: Diagnosis not present

## 2020-08-31 DIAGNOSIS — M159 Polyosteoarthritis, unspecified: Secondary | ICD-10-CM | POA: Diagnosis not present

## 2020-08-31 DIAGNOSIS — S81011D Laceration without foreign body, right knee, subsequent encounter: Secondary | ICD-10-CM | POA: Diagnosis not present

## 2020-08-31 DIAGNOSIS — R531 Weakness: Secondary | ICD-10-CM | POA: Diagnosis not present

## 2020-08-31 DIAGNOSIS — E7800 Pure hypercholesterolemia, unspecified: Secondary | ICD-10-CM | POA: Diagnosis not present

## 2020-08-31 DIAGNOSIS — N183 Chronic kidney disease, stage 3 unspecified: Secondary | ICD-10-CM | POA: Diagnosis not present

## 2020-08-31 DIAGNOSIS — I129 Hypertensive chronic kidney disease with stage 1 through stage 4 chronic kidney disease, or unspecified chronic kidney disease: Secondary | ICD-10-CM | POA: Diagnosis not present

## 2020-09-02 DIAGNOSIS — S81011D Laceration without foreign body, right knee, subsequent encounter: Secondary | ICD-10-CM | POA: Diagnosis not present

## 2020-09-02 DIAGNOSIS — N183 Chronic kidney disease, stage 3 unspecified: Secondary | ICD-10-CM | POA: Diagnosis not present

## 2020-09-02 DIAGNOSIS — E7800 Pure hypercholesterolemia, unspecified: Secondary | ICD-10-CM | POA: Diagnosis not present

## 2020-09-02 DIAGNOSIS — I129 Hypertensive chronic kidney disease with stage 1 through stage 4 chronic kidney disease, or unspecified chronic kidney disease: Secondary | ICD-10-CM | POA: Diagnosis not present

## 2020-09-02 DIAGNOSIS — M159 Polyosteoarthritis, unspecified: Secondary | ICD-10-CM | POA: Diagnosis not present

## 2020-09-02 DIAGNOSIS — R531 Weakness: Secondary | ICD-10-CM | POA: Diagnosis not present

## 2020-09-05 DIAGNOSIS — I10 Essential (primary) hypertension: Secondary | ICD-10-CM | POA: Diagnosis not present

## 2020-09-05 DIAGNOSIS — L03119 Cellulitis of unspecified part of limb: Secondary | ICD-10-CM | POA: Diagnosis not present

## 2020-09-05 DIAGNOSIS — Z681 Body mass index (BMI) 19 or less, adult: Secondary | ICD-10-CM | POA: Diagnosis not present

## 2020-09-06 DIAGNOSIS — N183 Chronic kidney disease, stage 3 unspecified: Secondary | ICD-10-CM | POA: Diagnosis not present

## 2020-09-06 DIAGNOSIS — E7800 Pure hypercholesterolemia, unspecified: Secondary | ICD-10-CM | POA: Diagnosis not present

## 2020-09-06 DIAGNOSIS — R531 Weakness: Secondary | ICD-10-CM | POA: Diagnosis not present

## 2020-09-06 DIAGNOSIS — S81011D Laceration without foreign body, right knee, subsequent encounter: Secondary | ICD-10-CM | POA: Diagnosis not present

## 2020-09-06 DIAGNOSIS — M159 Polyosteoarthritis, unspecified: Secondary | ICD-10-CM | POA: Diagnosis not present

## 2020-09-06 DIAGNOSIS — I129 Hypertensive chronic kidney disease with stage 1 through stage 4 chronic kidney disease, or unspecified chronic kidney disease: Secondary | ICD-10-CM | POA: Diagnosis not present

## 2020-09-08 DIAGNOSIS — R932 Abnormal findings on diagnostic imaging of liver and biliary tract: Secondary | ICD-10-CM | POA: Diagnosis not present

## 2020-09-08 DIAGNOSIS — I129 Hypertensive chronic kidney disease with stage 1 through stage 4 chronic kidney disease, or unspecified chronic kidney disease: Secondary | ICD-10-CM | POA: Diagnosis not present

## 2020-09-08 DIAGNOSIS — S81011D Laceration without foreign body, right knee, subsequent encounter: Secondary | ICD-10-CM | POA: Diagnosis not present

## 2020-09-08 DIAGNOSIS — R531 Weakness: Secondary | ICD-10-CM | POA: Diagnosis not present

## 2020-09-08 DIAGNOSIS — E7800 Pure hypercholesterolemia, unspecified: Secondary | ICD-10-CM | POA: Diagnosis not present

## 2020-09-08 DIAGNOSIS — N183 Chronic kidney disease, stage 3 unspecified: Secondary | ICD-10-CM | POA: Diagnosis not present

## 2020-09-08 DIAGNOSIS — M159 Polyosteoarthritis, unspecified: Secondary | ICD-10-CM | POA: Diagnosis not present

## 2020-09-08 HISTORY — DX: Abnormal findings on diagnostic imaging of liver and biliary tract: R93.2

## 2020-09-09 DIAGNOSIS — E7800 Pure hypercholesterolemia, unspecified: Secondary | ICD-10-CM | POA: Diagnosis not present

## 2020-09-09 DIAGNOSIS — M159 Polyosteoarthritis, unspecified: Secondary | ICD-10-CM | POA: Diagnosis not present

## 2020-09-09 DIAGNOSIS — N183 Chronic kidney disease, stage 3 unspecified: Secondary | ICD-10-CM | POA: Diagnosis not present

## 2020-09-09 DIAGNOSIS — I129 Hypertensive chronic kidney disease with stage 1 through stage 4 chronic kidney disease, or unspecified chronic kidney disease: Secondary | ICD-10-CM | POA: Diagnosis not present

## 2020-09-09 DIAGNOSIS — R531 Weakness: Secondary | ICD-10-CM | POA: Diagnosis not present

## 2020-09-09 DIAGNOSIS — S81011D Laceration without foreign body, right knee, subsequent encounter: Secondary | ICD-10-CM | POA: Diagnosis not present

## 2020-09-12 ENCOUNTER — Other Ambulatory Visit: Payer: Self-pay | Admitting: Cardiology

## 2020-09-12 NOTE — Telephone Encounter (Signed)
Rx refill sent to pharmacy.

## 2020-09-13 DIAGNOSIS — M159 Polyosteoarthritis, unspecified: Secondary | ICD-10-CM | POA: Diagnosis not present

## 2020-09-13 DIAGNOSIS — E7800 Pure hypercholesterolemia, unspecified: Secondary | ICD-10-CM | POA: Diagnosis not present

## 2020-09-13 DIAGNOSIS — R531 Weakness: Secondary | ICD-10-CM | POA: Diagnosis not present

## 2020-09-13 DIAGNOSIS — S81011D Laceration without foreign body, right knee, subsequent encounter: Secondary | ICD-10-CM | POA: Diagnosis not present

## 2020-09-13 DIAGNOSIS — I129 Hypertensive chronic kidney disease with stage 1 through stage 4 chronic kidney disease, or unspecified chronic kidney disease: Secondary | ICD-10-CM | POA: Diagnosis not present

## 2020-09-13 DIAGNOSIS — N183 Chronic kidney disease, stage 3 unspecified: Secondary | ICD-10-CM | POA: Diagnosis not present

## 2020-09-14 ENCOUNTER — Other Ambulatory Visit (HOSPITAL_COMMUNITY): Payer: Self-pay | Admitting: Surgery

## 2020-09-15 ENCOUNTER — Other Ambulatory Visit (HOSPITAL_COMMUNITY): Payer: Self-pay | Admitting: Surgery

## 2020-09-15 ENCOUNTER — Other Ambulatory Visit: Payer: Self-pay | Admitting: Surgery

## 2020-09-15 DIAGNOSIS — R932 Abnormal findings on diagnostic imaging of liver and biliary tract: Secondary | ICD-10-CM

## 2020-09-16 DIAGNOSIS — M159 Polyosteoarthritis, unspecified: Secondary | ICD-10-CM | POA: Diagnosis not present

## 2020-09-16 DIAGNOSIS — S81011D Laceration without foreign body, right knee, subsequent encounter: Secondary | ICD-10-CM | POA: Diagnosis not present

## 2020-09-16 DIAGNOSIS — I129 Hypertensive chronic kidney disease with stage 1 through stage 4 chronic kidney disease, or unspecified chronic kidney disease: Secondary | ICD-10-CM | POA: Diagnosis not present

## 2020-09-16 DIAGNOSIS — E7800 Pure hypercholesterolemia, unspecified: Secondary | ICD-10-CM | POA: Diagnosis not present

## 2020-09-16 DIAGNOSIS — N183 Chronic kidney disease, stage 3 unspecified: Secondary | ICD-10-CM | POA: Diagnosis not present

## 2020-09-16 DIAGNOSIS — R531 Weakness: Secondary | ICD-10-CM | POA: Diagnosis not present

## 2020-09-19 DIAGNOSIS — M159 Polyosteoarthritis, unspecified: Secondary | ICD-10-CM | POA: Diagnosis not present

## 2020-09-19 DIAGNOSIS — R531 Weakness: Secondary | ICD-10-CM | POA: Diagnosis not present

## 2020-09-19 DIAGNOSIS — E7800 Pure hypercholesterolemia, unspecified: Secondary | ICD-10-CM | POA: Diagnosis not present

## 2020-09-19 DIAGNOSIS — S81011D Laceration without foreign body, right knee, subsequent encounter: Secondary | ICD-10-CM | POA: Diagnosis not present

## 2020-09-19 DIAGNOSIS — N183 Chronic kidney disease, stage 3 unspecified: Secondary | ICD-10-CM | POA: Diagnosis not present

## 2020-09-19 DIAGNOSIS — I129 Hypertensive chronic kidney disease with stage 1 through stage 4 chronic kidney disease, or unspecified chronic kidney disease: Secondary | ICD-10-CM | POA: Diagnosis not present

## 2020-09-21 ENCOUNTER — Ambulatory Visit (INDEPENDENT_AMBULATORY_CARE_PROVIDER_SITE_OTHER): Payer: Medicare Other

## 2020-09-21 DIAGNOSIS — I129 Hypertensive chronic kidney disease with stage 1 through stage 4 chronic kidney disease, or unspecified chronic kidney disease: Secondary | ICD-10-CM | POA: Diagnosis not present

## 2020-09-21 DIAGNOSIS — E7800 Pure hypercholesterolemia, unspecified: Secondary | ICD-10-CM | POA: Diagnosis not present

## 2020-09-21 DIAGNOSIS — N183 Chronic kidney disease, stage 3 unspecified: Secondary | ICD-10-CM | POA: Diagnosis not present

## 2020-09-21 DIAGNOSIS — M159 Polyosteoarthritis, unspecified: Secondary | ICD-10-CM | POA: Diagnosis not present

## 2020-09-21 DIAGNOSIS — I442 Atrioventricular block, complete: Secondary | ICD-10-CM | POA: Diagnosis not present

## 2020-09-21 DIAGNOSIS — S81011D Laceration without foreign body, right knee, subsequent encounter: Secondary | ICD-10-CM | POA: Diagnosis not present

## 2020-09-21 DIAGNOSIS — R531 Weakness: Secondary | ICD-10-CM | POA: Diagnosis not present

## 2020-09-22 DIAGNOSIS — S81011D Laceration without foreign body, right knee, subsequent encounter: Secondary | ICD-10-CM | POA: Diagnosis not present

## 2020-09-22 DIAGNOSIS — E7800 Pure hypercholesterolemia, unspecified: Secondary | ICD-10-CM | POA: Diagnosis not present

## 2020-09-22 DIAGNOSIS — M159 Polyosteoarthritis, unspecified: Secondary | ICD-10-CM | POA: Diagnosis not present

## 2020-09-22 DIAGNOSIS — R531 Weakness: Secondary | ICD-10-CM | POA: Diagnosis not present

## 2020-09-22 DIAGNOSIS — N183 Chronic kidney disease, stage 3 unspecified: Secondary | ICD-10-CM | POA: Diagnosis not present

## 2020-09-22 DIAGNOSIS — I129 Hypertensive chronic kidney disease with stage 1 through stage 4 chronic kidney disease, or unspecified chronic kidney disease: Secondary | ICD-10-CM | POA: Diagnosis not present

## 2020-09-23 LAB — CUP PACEART REMOTE DEVICE CHECK
Battery Remaining Longevity: 128 mo
Battery Remaining Percentage: 95.5 %
Battery Voltage: 3.01 V
Brady Statistic AP VP Percent: 1 %
Brady Statistic AP VS Percent: 13 %
Brady Statistic AS VP Percent: 1 %
Brady Statistic AS VS Percent: 86 %
Brady Statistic RA Percent Paced: 13 %
Brady Statistic RV Percent Paced: 1 %
Date Time Interrogation Session: 20211222020013
Implantable Lead Implant Date: 20190620
Implantable Lead Implant Date: 20190620
Implantable Lead Location: 753859
Implantable Lead Location: 753860
Implantable Pulse Generator Implant Date: 20190620
Lead Channel Impedance Value: 350 Ohm
Lead Channel Impedance Value: 440 Ohm
Lead Channel Pacing Threshold Amplitude: 0.75 V
Lead Channel Pacing Threshold Amplitude: 0.75 V
Lead Channel Pacing Threshold Pulse Width: 0.5 ms
Lead Channel Pacing Threshold Pulse Width: 0.5 ms
Lead Channel Sensing Intrinsic Amplitude: 4.8 mV
Lead Channel Sensing Intrinsic Amplitude: 9.5 mV
Lead Channel Setting Pacing Amplitude: 1 V
Lead Channel Setting Pacing Amplitude: 2 V
Lead Channel Setting Pacing Pulse Width: 0.5 ms
Lead Channel Setting Sensing Sensitivity: 2 mV
Pulse Gen Model: 2272
Pulse Gen Serial Number: 9035930

## 2020-09-29 ENCOUNTER — Encounter (HOSPITAL_COMMUNITY): Payer: Self-pay

## 2020-09-29 ENCOUNTER — Ambulatory Visit (HOSPITAL_COMMUNITY): Admission: RE | Admit: 2020-09-29 | Payer: Medicare Other | Source: Ambulatory Visit

## 2020-10-05 NOTE — Progress Notes (Signed)
Remote pacemaker transmission.   

## 2020-10-11 ENCOUNTER — Other Ambulatory Visit: Payer: Self-pay | Admitting: Cardiology

## 2020-10-11 NOTE — Progress Notes (Signed)
Cardiology Office Note:    Date:  10/12/2020   ID:  Kaitlin Villarreal, DOB 1926/06/23, MRN [id number]  PCP:  Angelina Sheriff, MD  Cardiologist:  Shirlee More, MD    Referring MD: Angelina Sheriff, MD    ASSESSMENT:    1. S/P TAVR (transcatheter aortic valve replacement)   2. 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 Stage 3 chronic kidney disease, unspecified whether stage 3a or 3b CKD ([place])   4. Complete AV block ([place])   5. Pacemaker    PLAN:    In order of problems listed above:  1. Narcissus overall is doing quite well the best she has since her TAVR BP is well controlled heart failure is compensated stable CKD normal pacemaker function.  She is up-to-date with her echocardiogram at this time I continue her current medical regimen including loop and distal diuretic statin ACE and beta-blocker.   Next appointment: 6 months   Medication Adjustments/Labs and Tests Ordered: Current medicines are reviewed at length with the patient today.  Concerns regarding medicines are outlined above.  No orders of the defined types were placed in this encounter.  No orders of the defined types were placed in this encounter.   Chief Complaint  Patient presents with  . Follow-up  . Congestive Heart Failure    History of Present Illness:    Kaitlin Villarreal is a 87 y.o. female with a hx of aortic stenosis TAVR permanent pacemaker following TAVR hypertensive heart disease with chronic kidney disease  last seen 07/04/2020.  Echocardiogram was performed 08/12/2019 which showed normal TAVR function mild residual VSD.  Her last device check 09/19/2020 showed normal function and brief atrial tachycardia no episodes of atrial fibrillation Compliance with diet, lifestyle and medications: Yes  She has recovered from her trauma and really is doing the best she has since she had TAVR no edema shortness of breath chest pain palpitation or  syncope I encouraged her to use a cane to avoid further falls and injury.  She was seen at [facility] 08/25/2020 with cellulitis right lower extremity White count was elevated 22,800 hemoglobin 11.1 creatinine mildly elevated 1.20 GFR 42 cc there is a notation she is admitted to the hospital for IV antibiotic therapy x-ray was clear she had a CT scan of chest abdomen and pelvis which showed thickening of the gallbladder wall right upper lobe noncalcified lung nodules bilateral hydronephrosis without evidence of hydroureter or calculi and multilevel degenerative change throughout the thoracic and lumbar spine.  She has CT of the right lower extremity which showed no acute osseous abnormality she had soft tissue swelling and hemorrhage compatible with her history of laceration and degenerative changes of the right knee. Past Medical History:  Diagnosis Date  . Acute on chronic diastolic heart failure ([place]) 03/18/2018  . Aortic stenosis 10/14/2014   Formatting of this note might be different from the original.  moderate by echo 11/2015  . Arthritis   . Bilateral renal masses 07/26/2017  . Bone spur of toe of left foot   . Chronic diastolic (congestive) heart failure ([place])   . Chronic diastolic heart failure ([place]) 03/18/2018  . CKD (chronic kidney disease) stage 3, GFR 30-59 ml/min (HCC) 07/26/2017  . Essential hypertension   . GERD (gastroesophageal reflux disease)   . HLD (hyperlipidemia)   . Hyperlipidemia 11/25/2016  . Pacemaker 09/02/2018  . Pancreatic lesion 04/17/2018  .  Pancreatic mass    a. benign appearing but needs f/u, noted on pre TAVR CTs  . 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 Severe aortic stenosis    a. 03/2018: s/p TAVR by Burt Knack and Dr. Roxy Manns  . Stage 3 chronic kidney disease ([place])   . TIA (transient ischemic  attack)     a. 1992  . VSD (ventricular septal defect)     Past Surgical History:  Procedure Laterality Date  . ABDOMINAL HYSTERECTOMY    . APPENDECTOMY    . HERNIA REPAIR    . INTRAOPERATIVE TRANSTHORACIC ECHOCARDIOGRAM N/A 03/18/2018   Procedure: INTRAOPERATIVE TRANSTHORACIC ECHOCARDIOGRAM;  Surgeon: Sherren Mocha, MD;  Location: [place];  Service: Open Heart Surgery;  Laterality: N/A;  . PACEMAKER IMPLANT N/A 03/20/2018   Procedure: PACEMAKER IMPLANT;  Surgeon: Constance Haw, MD;  Location: [place] [facility];  Service: Cardiovascular;  Laterality: N/A;  . RIGHT HEART CATH N/A 03/20/2018   Procedure: RIGHT HEART CATH;  Surgeon: Sherren Mocha, MD;  Location: [place] [facility];  Service: Cardiovascular;  Laterality: N/A;  . RIGHT/LEFT HEART CATH AND CORONARY ANGIOGRAPHY N/A 02/06/2018   Procedure: RIGHT/LEFT HEART CATH AND CORONARY ANGIOGRAPHY;  Surgeon: Sherren Mocha, MD;  Location: [place] [facility];  Service: Cardiovascular;  Laterality: N/A;  . TONSILLECTOMY    . TRANSCATHETER AORTIC VALVE REPLACEMENT, TRANSFEMORAL N/A 03/18/2018   Procedure: TRANSCATHETER AORTIC VALVE REPLACEMENT, TRANSFEMORAL;  Surgeon: Sherren Mocha, MD;  Location: [place];  Service: Open Heart Surgery;  Laterality: N/A;  . WISDOM TOOTH EXTRACTION      Current Medications: Current Meds  Medication Sig  . acetaminophen (TYLENOL) 650 MG CR tablet Take 650-1,300 mg by mouth every 8 (eight) hours as needed for pain.  Marland Kitchen amoxicillin (AMOXIL) 500 MG tablet Take 4 tablets (2,000 mg total) by mouth as directed. 1 hour prior to dental work including cleanings (Patient taking differently: Take 2,000 mg by mouth See admin instructions. Take 2,000 mg by mouth 1 hour prior to dental work, including cleanings)  . aspirin EC 81 MG tablet Take 81 mg by mouth daily.   . Carboxymethylcellulose Sod PF (THERATEARS PF) 0.25 % SOLN Place 1 drop into both eyes in the morning and at bedtime.  . Cyanocobalamin (B-12) 2500 MCG  TABS Take 2,500 mcg by mouth daily.   Marland Kitchen eplerenone (INSPRA) 25 MG tablet Take 1 tablet (25 mg total) by mouth daily.  Marland Kitchen estradiol (ESTRACE) 1 MG tablet Take 0.5 mg by mouth daily.  . famotidine (PEPCID) 20 MG tablet Take 20 mg by mouth as needed for heartburn or indigestion.  . fluticasone (FLONASE) 50 MCG/ACT nasal spray Place 2 sprays into both nostrils daily as needed for allergies or rhinitis.   . furosemide (LASIX) 20 MG tablet Take 20 mg by mouth daily as needed for fluid or edema (and an additional 20 mg once daily if swelling or edema is still evident in early afternoon).   . hydrALAZINE (APRESOLINE) 25 MG tablet Take 25 mg by mouth 3 (three) times daily as needed. When BP over 180  . lisinopril (ZESTRIL) 20 MG tablet Take 1 tablet by mouth once daily  . metoprolol tartrate (LOPRESSOR) 50 MG tablet Take 1 tablet by mouth twice daily (Patient taking differently: Take 50 mg by mouth 2 (two) times daily.)  . Multiple Vitamins-Minerals (  MULTI-VITAMIN GUMMIES) CHEW Chew 2 tablets by mouth daily.   . Omega-3 Fatty Acids (FISH OIL) 1000 MG CAPS Take 1,000 mg by mouth daily.  Vladimir Faster Glycol-Propyl Glycol (SYSTANE OP) Place 1 drop into both eyes See admin instructions. Instill 1 drop into both eyes two times a day when not using TheraTears  . pravastatin (PRAVACHOL) 20 MG tablet Take 20 mg by mouth at bedtime.   . Probiotic Product (CVS PROBIOTIC) CHEW Chew 2 each by mouth daily after supper.   . triamcinolone cream (KENALOG) 0.5 % Apply 3 application topically 3 (three) times daily as needed (for itching- AFFECTED AREAS).   . Vitamin D, Ergocalciferol, (DRISDOL) 1.25 MG (50000 UT) CAPS capsule Take 50,000 Units by mouth See admin instructions. Take 50,000 units by mouth 2 times a week- Mondays and Fridays     Allergies:   Clarithromycin, Latex, Levofloxacin, and Azithromycin   Social History   Socioeconomic History  . Marital status: Married    Spouse name: Not on file  . Number of  children: Not on file  . Years of education: Not on file  . Highest education level: Not on file  Occupational History  . Not on file  Tobacco Use  . Smoking status: Never Smoker  . Smokeless tobacco: Never Used  Vaping Use  . Vaping Use: Never used  Substance and Sexual Activity  . Alcohol use: No  . Drug use: No  . Sexual activity: Not on file  Other Topics Concern  . Not on file  Social History Narrative  . Not on file   Social Determinants of Health   Financial Resource Strain: Not on file  Food Insecurity: Not on file  Transportation Needs: Not on file  Physical Activity: Not on file  Stress: Not on file  Social Connections: Not on file     Family History: The patient's family history includes Congenital heart disease in her brother; Heart attack in her brother; Heart disease in her mother; Uterine cancer in her sister. ROS:   Please see the history of present illness.    All other systems reviewed and are negative.  EKGs/Labs/Other Studies Reviewed:    The following studies were reviewed today:    Recent Labs: 03/22/2020: NT-Pro BNP 1,445 06/08/2020: B Natriuretic Peptide 472.4; BUN 27; Creatinine, Ser 1.09; Hemoglobin 10.0; Platelets 177; Potassium 4.1; Sodium 139  Recent Lipid Panel    Component Value Date/Time   CHOL 229 (H) 02/06/2019 1140   TRIG 164 (H) 02/06/2019 1140   HDL 90 02/06/2019 1140   CHOLHDL 2.5 02/06/2019 1140   LDLCALC 106 (H) 02/06/2019 1140    Physical Exam:    VS:  BP (!) 148/68 (BP Location: Right Arm, Patient Position: Sitting, Cuff Size: Normal)   Pulse 68   Ht 5\' 1"  (1.549 m)   Wt 110 lb (49.9 kg)   SpO2 100%   BMI 20.78 kg/m     Wt Readings from Last 3 Encounters:  10/12/20 110 lb (49.9 kg)  07/04/20 110 lb (49.9 kg)  06/14/20 113 lb 9.6 oz (51.5 kg)     GEN: She looks frail well nourished, well developed in no acute distress HEENT: Normal NECK: No JVD; No carotid bruits LYMPHATICS: No lymphadenopathy CARDIAC: He  has grade 1 of 6 ejection murmur no aortic regurgitation RRR, no murmurs, rubs, gallops RESPIRATORY:  Clear to auscultation without rales, wheezing or rhonchi  ABDOMEN: Soft, non-tender, non-distended MUSCULOSKELETAL:  No edema; No deformity  SKIN: Warm and dry  NEUROLOGIC:  Alert and oriented x 3 PSYCHIATRIC:  Normal affect    Signed, Shirlee More, MD  10/12/2020 1:57 PM    [facility]

## 2020-10-12 ENCOUNTER — Ambulatory Visit (INDEPENDENT_AMBULATORY_CARE_PROVIDER_SITE_OTHER): Payer: Medicare Other | Admitting: Cardiology

## 2020-10-12 ENCOUNTER — Other Ambulatory Visit: Payer: Self-pay

## 2020-10-12 ENCOUNTER — Encounter: Payer: Self-pay | Admitting: Cardiology

## 2020-10-12 VITALS — BP 148/68 | HR 68 | Ht 61.0 in | Wt 110.0 lb

## 2020-10-12 DIAGNOSIS — Z952 Presence of prosthetic heart valve: Secondary | ICD-10-CM | POA: Diagnosis not present

## 2020-10-12 DIAGNOSIS — I130 Hypertensive heart and chronic kidney disease with heart failure and stage 1 through stage 4 chronic kidney disease, or unspecified chronic kidney disease: Secondary | ICD-10-CM

## 2020-10-12 DIAGNOSIS — I442 Atrioventricular block, complete: Secondary | ICD-10-CM

## 2020-10-12 DIAGNOSIS — N183 Chronic kidney disease, stage 3 unspecified: Secondary | ICD-10-CM

## 2020-10-12 DIAGNOSIS — Z950 Presence of cardiac pacemaker: Secondary | ICD-10-CM

## 2020-10-12 NOTE — Patient Instructions (Signed)

## 2020-11-28 ENCOUNTER — Encounter: Payer: Self-pay | Admitting: Cardiology

## 2020-11-28 ENCOUNTER — Other Ambulatory Visit: Payer: Self-pay

## 2020-11-28 ENCOUNTER — Ambulatory Visit (INDEPENDENT_AMBULATORY_CARE_PROVIDER_SITE_OTHER): Payer: Medicare Other | Admitting: Cardiology

## 2020-11-28 VITALS — BP 116/68 | HR 64 | Ht 60.0 in | Wt 110.0 lb

## 2020-11-28 DIAGNOSIS — I129 Hypertensive chronic kidney disease with stage 1 through stage 4 chronic kidney disease, or unspecified chronic kidney disease: Secondary | ICD-10-CM | POA: Diagnosis not present

## 2020-11-28 DIAGNOSIS — I442 Atrioventricular block, complete: Secondary | ICD-10-CM

## 2020-11-28 DIAGNOSIS — N189 Chronic kidney disease, unspecified: Secondary | ICD-10-CM | POA: Diagnosis not present

## 2020-11-28 DIAGNOSIS — I10 Essential (primary) hypertension: Secondary | ICD-10-CM | POA: Diagnosis not present

## 2020-11-28 LAB — CUP PACEART INCLINIC DEVICE CHECK
Battery Remaining Longevity: 141 mo
Battery Voltage: 3.01 V
Brady Statistic RA Percent Paced: 14 %
Brady Statistic RV Percent Paced: 0.08 %
Date Time Interrogation Session: 20220228114100
Implantable Lead Implant Date: 20190620
Implantable Lead Implant Date: 20190620
Implantable Lead Location: 753859
Implantable Lead Location: 753860
Implantable Pulse Generator Implant Date: 20190620
Lead Channel Impedance Value: 387.5 Ohm
Lead Channel Impedance Value: 450 Ohm
Lead Channel Pacing Threshold Amplitude: 0.75 V
Lead Channel Pacing Threshold Amplitude: 0.75 V
Lead Channel Pacing Threshold Pulse Width: 0.5 ms
Lead Channel Pacing Threshold Pulse Width: 0.5 ms
Lead Channel Sensing Intrinsic Amplitude: 11.3 mV
Lead Channel Sensing Intrinsic Amplitude: 4.6 mV
Lead Channel Setting Pacing Amplitude: 1 V
Lead Channel Setting Pacing Amplitude: 2 V
Lead Channel Setting Pacing Pulse Width: 0.5 ms
Lead Channel Setting Sensing Sensitivity: 2 mV
Pulse Gen Model: 2272
Pulse Gen Serial Number: 9035930

## 2020-11-28 NOTE — Patient Instructions (Signed)
Medication Instructions:  Your physician recommends that you continue on your current medications as directed. Please refer to the Current Medication list given to you today.  *If you need a refill on your cardiac medications before your next appointment, please call your pharmacy*   Lab Work: None ordered   Testing/Procedures: None ordered   Follow-Up: At [facility], you and your health needs are our priority.  As part of our continuing mission to provide you with exceptional heart care, we have created designated Provider Care Teams.  These Care Teams include your primary Cardiologist (physician) and Advanced Practice Providers (APPs -  Physician Assistants and Nurse Practitioners) who all work together to provide you with the care you need, when you need it.  Remote monitoring is used to monitor your Pacemaker or ICD from home. This monitoring reduces the number of office visits required to check your device to one time per year. It allows [place] to keep an eye on the functioning of your device to ensure it is working properly. You are scheduled for a device check from home on 12/21/2020. You may send your transmission at any time that day. If you have a wireless device, the transmission will be sent automatically. After your physician reviews your transmission, you will receive a postcard with your next transmission date.  Your next appointment:   1 year(s)  The format for your next appointment:   In Person  Provider:   Allegra Lai, MD   Thank you for choosing [facility]!!   Trinidad Curet, RN [phone]

## 2020-11-28 NOTE — Progress Notes (Signed)
Electrophysiology Office Note   Date:  11/28/2020   ID:  Kaitlin Villarreal, DOB 1925/12/27, MRN [id number]  PCP:  Angelina Sheriff, MD  Cardiologist:  Bettina Gavia Primary Electrophysiologist:  Roddrick Sharron Meredith Leeds, MD    No chief complaint on file.    History of Present Illness: Kaitlin Villarreal is a 87 y.o. female who is being seen today for the evaluation of complete heart block at the request of Shirlee More. Presenting today for electrophysiology evaluation.    He has a history of severe aortic stenosis status post TAVR, VSD, complete heart block status post [employer] Jude dual-chamber pacemaker 1/85/6314, chronic diastolic heart failure, and hypertension.  Today, denies symptoms of palpitations, chest pain, shortness of breath, orthopnea, PND, lower extremity edema, claudication, dizziness, presyncope, syncope, bleeding, or neurologic sequela. The patient is tolerating medications without difficulties.  Since last being seen, she has no cardiac complaints.  She is able to do obstruction.  She had a fall and required multiple stitches on her knee.  She is also had a few hospitalizations due to sepsis and after her Covid shots when she was quite hypertensive.   Past Medical History:  Diagnosis Date  . Acute on chronic diastolic heart failure ([place]) 03/18/2018  . Aortic stenosis 10/14/2014   Formatting of this note might be different from the original.  moderate by echo 11/2015  . Arthritis   . Bilateral renal masses 07/26/2017  . Bone spur of toe of left foot   . Chronic diastolic (congestive) heart failure ([place])   . Chronic diastolic heart failure ([place]) 03/18/2018  . CKD (chronic kidney disease) stage 3, GFR 30-59 ml/min (HCC) 07/26/2017  . Essential hypertension   . GERD (gastroesophageal reflux disease)   . HLD (hyperlipidemia)   . Hyperlipidemia 11/25/2016  . Pacemaker 09/02/2018  . Pancreatic lesion 04/17/2018  . Pancreatic mass    a. benign appearing but needs f/u, noted on pre TAVR CTs  .  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 Severe aortic stenosis    a. 03/2018: s/p TAVR by Burt Knack and Dr. Roxy Manns  . Stage 3 chronic kidney disease ([place])   . TIA (transient ischemic attack)     a. 1992  . VSD (ventricular septal defect)    Past Surgical History:  Procedure Laterality Date  . ABDOMINAL HYSTERECTOMY    . APPENDECTOMY    . HERNIA REPAIR    . INTRAOPERATIVE TRANSTHORACIC ECHOCARDIOGRAM N/A 03/18/2018   Procedure: INTRAOPERATIVE TRANSTHORACIC ECHOCARDIOGRAM;  Surgeon: Sherren Mocha, MD;  Location: [facility];  Service: Open Heart Surgery;  Laterality: N/A;  . PACEMAKER IMPLANT N/A 03/20/2018   Procedure: PACEMAKER IMPLANT;  Surgeon: Constance Haw, MD;  Location: [facility];  Service: Cardiovascular;  Laterality: N/A;  . RIGHT HEART CATH N/A 03/20/2018   Procedure: RIGHT HEART CATH;  Surgeon: Sherren Mocha, MD;  Location: [facility];  Service: Cardiovascular;  Laterality: N/A;  . RIGHT/LEFT HEART CATH AND CORONARY ANGIOGRAPHY N/A 02/06/2018   Procedure: RIGHT/LEFT HEART CATH AND CORONARY ANGIOGRAPHY;  Surgeon: Sherren Mocha, MD;  Location: [facility];  Service: Cardiovascular;  Laterality: N/A;  . TONSILLECTOMY    . TRANSCATHETER AORTIC VALVE REPLACEMENT, TRANSFEMORAL N/A 03/18/2018   Procedure: TRANSCATHETER AORTIC VALVE REPLACEMENT, TRANSFEMORAL;  Surgeon: Sherren Mocha, MD;  Location: [facility];  Service: Open Heart Surgery;  Laterality: N/A;  . WISDOM TOOTH EXTRACTION       Current Outpatient Medications  Medication Sig Dispense Refill  . acetaminophen (TYLENOL) 650 MG CR tablet Take 650-1,300 mg by mouth every 8 (eight) hours as needed for pain.    Marland Kitchen amoxicillin (AMOXIL) 500 MG tablet Take 4 tablets (2,000 mg total) by mouth as directed. 1 hour prior to dental work including cleanings  (Patient taking differently: Take 2,000 mg by mouth See admin instructions. Take 2,000 mg by mouth 1 hour prior to dental work, including cleanings) 12 tablet 12  . aspirin EC 81 MG tablet Take 81 mg by mouth daily.     . Carboxymethylcellulose Sod PF (THERATEARS PF) 0.25 % SOLN Place 1 drop into both eyes in the morning and at bedtime.    . Cyanocobalamin (B-12) 2500 MCG TABS Take 2,500 mcg by mouth daily.     Marland Kitchen eplerenone (INSPRA) 25 MG tablet Take 1 tablet (25 mg total) by mouth daily. 90 tablet 3  . estradiol (ESTRACE) 1 MG tablet Take 0.5 mg by mouth daily.    . famotidine (PEPCID) 20 MG tablet Take 20 mg by mouth as needed for heartburn or indigestion.    . fluticasone (FLONASE) 50 MCG/ACT nasal spray Place 2 sprays into both nostrils daily as needed for allergies or rhinitis.     . furosemide (LASIX) 20 MG tablet Take 20 mg by mouth daily as needed for fluid or edema (and an additional 20 mg once daily if swelling or edema is still evident in early afternoon).     . hydrALAZINE (APRESOLINE) 25 MG tablet Take 25 mg by mouth 3 (three) times daily as needed. When BP over 180    . lisinopril (ZESTRIL) 20 MG tablet Take 1 tablet by mouth once daily 30 tablet 2  . metoprolol tartrate (LOPRESSOR) 50 MG tablet Take 1 tablet by mouth twice daily (Patient taking differently: Take 50 mg by mouth 2 (two) times daily.) 180 tablet 2  . Multiple Vitamins-Minerals (MULTI-VITAMIN GUMMIES) CHEW Chew 2 tablets by mouth daily.     . Omega-3 Fatty Acids (FISH OIL) 1000 MG CAPS Take 1,000 mg by mouth daily.    Vladimir Faster Glycol-Propyl Glycol (SYSTANE OP) Place 1 drop into both eyes See admin instructions. Instill 1 drop into both eyes two times a day when not using TheraTears    . pravastatin (PRAVACHOL) 20 MG tablet Take 20 mg by mouth at bedtime.     . Probiotic Product ([employer]S PROBIOTIC) CHEW Chew 2 each by mouth daily after supper.     . triamcinolone cream (KENALOG) 0.5 % Apply 3 application topically 3 (three)  times daily as needed (for itching- AFFECTED AREAS).     . Vitamin D, Ergocalciferol, (DRISDOL) 1.25 MG (50000 UT) CAPS capsule Take 50,000 Units by mouth See admin instructions. Take 50,000 units by mouth 2 times a week- Mondays and Fridays  1   No current facility-administered medications for this visit.    Allergies:   Clarithromycin, Latex, Levofloxacin, and Azithromycin   Social History:  The patient  reports that she has never smoked. She has never used smokeless tobacco. She reports that she does not drink alcohol and does not use drugs.   Family History:  The patient's family history includes Congenital heart disease in her brother; Heart attack in her brother; Heart disease in her mother; Uterine cancer in her sister.    ROS:  Please see the history of  present illness.   Otherwise, review of systems is positive for none.   All other systems are reviewed and negative.   PHYSICAL EXAM: VS:  BP 116/68   Pulse 64   Ht 5' (1.524 m)   Wt 110 lb (49.9 kg)   SpO2 97%   BMI 21.48 kg/m  , BMI Body mass index is 21.48 kg/m. GEN: Well nourished, well developed, in no acute distress  HEENT: normal  Neck: no JVD, carotid bruits, or masses Cardiac: RRR; no murmurs, rubs, or gallops,no edema  Respiratory:  clear to auscultation bilaterally, normal work of breathing GI: soft, nontender, nondistended, + BS MS: no deformity or atrophy  Skin: warm and dry, device site well healed Neuro:  Strength and sensation are intact Psych: euthymic mood, full affect  EKG:  EKG is ordered today. Personal review of the ekg ordered shows atrial paced, PACs, rate 64  Personal review of the device interrogation today. Results in [facility]: 03/22/2020: NT-Pro BNP 1,445 06/08/2020: B Natriuretic Peptide 472.4; BUN 27; Creatinine, Ser 1.09; Hemoglobin 10.0; Platelets 177; Potassium 4.1; Sodium 139    Lipid Panel     Component Value Date/Time   CHOL 229 (H) 02/06/2019 1140   TRIG 164 (H)  02/06/2019 1140   HDL 90 02/06/2019 1140   CHOLHDL 2.5 02/06/2019 1140   LDLCALC 106 (H) 02/06/2019 1140     Wt Readings from Last 3 Encounters:  11/28/20 110 lb (49.9 kg)  10/12/20 110 lb (49.9 kg)  07/04/20 110 lb (49.9 kg)      Other studies Reviewed: Additional studies/ records that were reviewed today include: TTE 05/05/18  Review of the above records today demonstrates:  - Left ventricle: The cavity size was normal. Wall thickness was   increased in a pattern of moderate LVH. Systolic function was   normal. The estimated ejection fraction was in the range of 60%   to 65%. Wall motion was normal; there were no regional wall   motion abnormalities. Features are consistent with a pseudonormal   left ventricular filling pattern, with concomitant abnormal   relaxation and increased filling pressure (grade 2 diastolic   dysfunction). - Aortic valve: A bioprosthesis was present. There was trivial   regurgitation. Valve area (VTI): 2.93 cm^2. Valve area (Vmax):   2.14 cm^2. Valve area (Vmean): 2.5 cm^2. - Mitral valve: There was mild regurgitation. - Left atrium: The atrium was moderately dilated. - Pulmonary arteries: Systolic pressure was moderately to severely   increased. PA peak pressure: 66 mm Hg (S).   ASSESSMENT AND PLAN:  1.  Complete heart block: Status post [facility] Jude dual-chamber pacemaker.  Device functioning appropriately.    2.  Severe aortic stenosis: Status post TAVR  3.  Chronic diastolic heart failure: No obvious volume overload  4.  Hypertension: Currently well controlled  Current medicines are reviewed at length with the patient today.   The patient does not have concerns regarding her medicines.  The following changes were made today: None  Labs/ tests ordered today include:  Orders Placed This Encounter  Procedures  . EKG 12-Lead     Disposition:   FU with Cassandra Mcmanaman 12 months  Signed, Adriene Knipfer Meredith Leeds, MD  11/28/2020 11:07 AM      [facility] [address] [place] [address] [phone] (office) [phone] (fax)

## 2020-12-21 ENCOUNTER — Ambulatory Visit (INDEPENDENT_AMBULATORY_CARE_PROVIDER_SITE_OTHER): Payer: Medicare Other

## 2020-12-21 DIAGNOSIS — I442 Atrioventricular block, complete: Secondary | ICD-10-CM

## 2020-12-21 LAB — CUP PACEART REMOTE DEVICE CHECK
Battery Remaining Longevity: 125 mo
Battery Remaining Percentage: 95.5 %
Battery Voltage: 3.01 V
Brady Statistic AP VP Percent: 1 %
Brady Statistic AP VS Percent: 40 %
Brady Statistic AS VP Percent: 1 %
Brady Statistic AS VS Percent: 59 %
Brady Statistic RA Percent Paced: 38 %
Brady Statistic RV Percent Paced: 1 %
Date Time Interrogation Session: 20220323020014
Implantable Lead Implant Date: 20190620
Implantable Lead Implant Date: 20190620
Implantable Lead Location: 753859
Implantable Lead Location: 753860
Implantable Pulse Generator Implant Date: 20190620
Lead Channel Impedance Value: 380 Ohm
Lead Channel Impedance Value: 440 Ohm
Lead Channel Pacing Threshold Amplitude: 0.75 V
Lead Channel Pacing Threshold Amplitude: 0.75 V
Lead Channel Pacing Threshold Pulse Width: 0.5 ms
Lead Channel Pacing Threshold Pulse Width: 0.5 ms
Lead Channel Sensing Intrinsic Amplitude: 4.7 mV
Lead Channel Sensing Intrinsic Amplitude: 9.2 mV
Lead Channel Setting Pacing Amplitude: 1 V
Lead Channel Setting Pacing Amplitude: 2 V
Lead Channel Setting Pacing Pulse Width: 0.5 ms
Lead Channel Setting Sensing Sensitivity: 2 mV
Pulse Gen Model: 2272
Pulse Gen Serial Number: 9035930

## 2020-12-30 NOTE — Progress Notes (Signed)
Remote pacemaker transmission.   

## 2021-01-07 ENCOUNTER — Other Ambulatory Visit: Payer: Self-pay | Admitting: Cardiology

## 2021-01-09 NOTE — Telephone Encounter (Signed)
Lisinopril approved and sent

## 2021-01-12 ENCOUNTER — Telehealth: Payer: Self-pay | Admitting: Cardiology

## 2021-01-12 MED ORDER — METOPROLOL TARTRATE 50 MG PO TABS
50.0000 mg | ORAL_TABLET | Freq: Two times a day (BID) | ORAL | 2 refills | Status: DC
Start: 2021-01-12 — End: 2021-04-10

## 2021-01-12 NOTE — Telephone Encounter (Signed)
Refill sent in per request.  

## 2021-01-12 NOTE — Telephone Encounter (Signed)
*  STAT* If patient is at the pharmacy, call can be transferred to refill team.   1. Which medications need to be refilled? (please list name of each medication and dose if known)  metoprolol tartrate (LOPRESSOR) 50 MG tablet   2. Which pharmacy/location (including street and city if local pharmacy) is medication to be sent to? [place], [place]  3. Do they need a 30 day or 90 day supply? 90  Patient is out of medication

## 2021-01-30 DIAGNOSIS — L821 Other seborrheic keratosis: Secondary | ICD-10-CM | POA: Diagnosis not present

## 2021-01-30 DIAGNOSIS — L570 Actinic keratosis: Secondary | ICD-10-CM | POA: Diagnosis not present

## 2021-01-30 DIAGNOSIS — L578 Other skin changes due to chronic exposure to nonionizing radiation: Secondary | ICD-10-CM | POA: Diagnosis not present

## 2021-03-22 ENCOUNTER — Ambulatory Visit (INDEPENDENT_AMBULATORY_CARE_PROVIDER_SITE_OTHER): Payer: Medicare Other

## 2021-03-22 DIAGNOSIS — I442 Atrioventricular block, complete: Secondary | ICD-10-CM

## 2021-03-22 LAB — CUP PACEART REMOTE DEVICE CHECK
Battery Remaining Longevity: 96 mo
Battery Remaining Percentage: 78 %
Battery Voltage: 3.01 V
Brady Statistic AP VP Percent: 1 %
Brady Statistic AP VS Percent: 34 %
Brady Statistic AS VP Percent: 1 %
Brady Statistic AS VS Percent: 66 %
Brady Statistic RA Percent Paced: 33 %
Brady Statistic RV Percent Paced: 1 %
Date Time Interrogation Session: 20220622020014
Implantable Lead Implant Date: 20190620
Implantable Lead Implant Date: 20190620
Implantable Lead Location: 753859
Implantable Lead Location: 753860
Implantable Pulse Generator Implant Date: 20190620
Lead Channel Impedance Value: 380 Ohm
Lead Channel Impedance Value: 480 Ohm
Lead Channel Pacing Threshold Amplitude: 0.75 V
Lead Channel Pacing Threshold Amplitude: 0.75 V
Lead Channel Pacing Threshold Pulse Width: 0.5 ms
Lead Channel Pacing Threshold Pulse Width: 0.5 ms
Lead Channel Sensing Intrinsic Amplitude: 4.6 mV
Lead Channel Sensing Intrinsic Amplitude: 8.1 mV
Lead Channel Setting Pacing Amplitude: 1 V
Lead Channel Setting Pacing Amplitude: 2 V
Lead Channel Setting Pacing Pulse Width: 0.5 ms
Lead Channel Setting Sensing Sensitivity: 2 mV
Pulse Gen Model: 2272
Pulse Gen Serial Number: 9035930

## 2021-04-09 NOTE — Progress Notes (Signed)
Cardiology Office Note:    Date:  04/10/2021   ID:  Kaitlin Villarreal, DOB September 26, 1926, MRN [id number]  PCP:  Angelina Sheriff, MD  Cardiologist:  Shirlee More, MD    Referring MD: Angelina Sheriff, MD    ASSESSMENT:    1. S/P TAVR (transcatheter aortic valve replacement)   2. 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 Pacemaker   4. 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 Stage 3 chronic kidney disease, unspecified whether stage 3a or 3b CKD ([place])   6. Mixed hyperlipidemia    PLAN:    In order of problems listed above:  Stable from a cardiac perspective she has no evidence of heart failure BP at target normal pacemaker function and no evidence of dysfunction post TAVR.  Unfortunately she is becoming increasingly frail and to remain in her home I told her she needs to have someone do heavy housework that really that [facility] and her husband are no longer capable. Recheck labs today with her CKD and for hyperlipidemia lipid profile. Continue her low intensity statin check lipid profile liver function   Next appointment: 6 months   Medication Adjustments/Labs and Tests Ordered: Current medicines are reviewed at length with the patient today.  Concerns regarding medicines are outlined above.  Orders Placed This Encounter  Procedures   Comprehensive metabolic panel   Lipid panel    No orders of the defined types were placed in this encounter.   Chief Complaint  Patient presents with   Follow-up    History of TAVR   Hypertension   Congestive Heart Failure     History of Present Illness:    Kaitlin Villarreal is a 87 y.o. female with a hx of severe symptomatic calcific aortic stenosis with TAVR June 2019 permanent pacemaker following TAVR for heart block hypertensive heart disease with chronic kidney disease last seen 10/12/2020. Compliance with diet, lifestyle and medications: Yes  She tells me that both her and her  husband are struggling to maintain her independence and she is not able to do the heavy housework anymore.  She fatigues easily but has no edema shortness of breath chest pain palpitation or syncope.  Her last remote pacemaker checks are 03/22/2021 reviewed normal device function and telemetry.  She is ventricularly paced 2% of the time atrially paced 100% of the time. Past Medical History:  Diagnosis Date   Acute on chronic diastolic heart failure ([place]) 03/18/2018   Aortic stenosis 10/14/2014   Formatting of this note might be different from the original.  moderate by echo 11/2015   Arthritis    Bilateral renal masses 07/26/2017   Bone spur of toe of left foot    Chronic diastolic (congestive) heart failure (HCC)    Chronic diastolic heart failure ([place]) 03/18/2018   CKD (chronic kidney disease) stage 3, GFR 30-59 ml/min (HCC) 07/26/2017   Essential hypertension    GERD (gastroesophageal reflux disease)    HLD (hyperlipidemia)    Hyperlipidemia 11/25/2016   Pacemaker 09/02/2018   Pancreatic lesion 04/17/2018   Pancreatic mass    a. 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  03/2018: s/p TAVR by Burt Knack and Dr. Roxy Manns   Stage 3 chronic kidney disease [facility])    TIA (transient ischemic attack)     a. 1992   VSD (ventricular septal defect)     Past Surgical History:  Procedure Laterality Date   ABDOMINAL HYSTERECTOMY     APPENDECTOMY     HERNIA REPAIR     INTRAOPERATIVE TRANSTHORACIC ECHOCARDIOGRAM N/A 03/18/2018   Procedure: INTRAOPERATIVE TRANSTHORACIC ECHOCARDIOGRAM;  Surgeon: Sherren Mocha, MD;  Location: [facility];  Service: Open Heart Surgery;  Laterality: N/A;   PACEMAKER IMPLANT N/A 03/20/2018   Procedure: PACEMAKER IMPLANT;  Surgeon: Constance Haw, MD;  Location: [place] [facility];  Service: Cardiovascular;  Laterality: N/A;   RIGHT HEART CATH N/A 03/20/2018   Procedure: RIGHT HEART CATH;  Surgeon: Sherren Mocha, MD;  Location: [place] [facility];  Service: Cardiovascular;  Laterality: N/A;   RIGHT/LEFT HEART CATH AND CORONARY ANGIOGRAPHY N/A 02/06/2018   Procedure: RIGHT/LEFT HEART CATH AND CORONARY ANGIOGRAPHY;  Surgeon: Sherren Mocha, MD;  Location: [place] [facility];  Service: Cardiovascular;  Laterality: N/A;   TONSILLECTOMY     TRANSCATHETER AORTIC VALVE REPLACEMENT, TRANSFEMORAL N/A 03/18/2018   Procedure: TRANSCATHETER AORTIC VALVE REPLACEMENT, TRANSFEMORAL;  Surgeon: Sherren Mocha, MD;  Location: [facility];  Service: Open Heart Surgery;  Laterality: N/A;   WISDOM TOOTH EXTRACTION      Current Medications: Current Meds  Medication Sig   acetaminophen (TYLENOL) 650 MG CR tablet Take 650-1,300 mg by mouth every 8 (eight) hours as needed for pain.   amoxicillin (AMOXIL) 500 MG tablet Take 4 tablets (2,000 mg total) by mouth as directed. 1 hour prior to dental work including cleanings   aspirin EC 81 MG tablet Take 81 mg by mouth daily.    Carboxymethylcellulose Sod PF (THERATEARS PF) 0.25 % SOLN Place 1 drop into both eyes in the morning and at bedtime.   Cyanocobalamin (B-12) 2500 MCG TABS Take 2,500 mcg by mouth daily.    eplerenone (INSPRA) 25 MG tablet Take 1 tablet (25 mg total) by mouth daily.   estradiol (ESTRACE) 1 MG tablet Take 0.5 mg by mouth daily.   famotidine (PEPCID) 20 MG tablet Take 20 mg by mouth as needed for heartburn or indigestion.   fluticasone (FLONASE) 50 MCG/ACT nasal spray Place 2 sprays into both nostrils daily as needed for allergies or rhinitis.    furosemide (LASIX) 20 MG tablet Take 20 mg by mouth daily.   hydrALAZINE (APRESOLINE) 25 MG tablet Take 25 mg by mouth 3 (three) times daily as needed. When BP over 180   lisinopril (ZESTRIL) 20 MG tablet Take 1 tablet by mouth once daily    metoprolol tartrate (LOPRESSOR) 50 MG tablet Take 50 mg by mouth daily.   Multiple Vitamins-Minerals (MULTI-VITAMIN GUMMIES) CHEW Chew 2 tablets by mouth daily.    Omega-3 Fatty Acids (FISH OIL) 1000 MG CAPS Take 1,000 mg by mouth daily.   pravastatin (PRAVACHOL) 20 MG tablet Take 20 mg by mouth at bedtime.    Probiotic Product (CVS PROBIOTIC) CHEW Chew 2 each by mouth daily after supper.    triamcinolone cream (KENALOG) 0.5 % Apply 3 application topically 3 (three) times daily as needed (for itching- AFFECTED AREAS).    Vitamin D, Ergocalciferol, (DRISDOL) 1.25 MG (50000 UT) CAPS capsule Take 50,000 Units by mouth See admin instructions. Take 50,000 units by mouth 2 times a week- Mondays and Fridays     Allergies:   Clarithromycin, Latex, Levofloxacin, and  Azithromycin   Social History   Socioeconomic History   Marital status: Married    Spouse name: Not on file   Number of children: Not on file   Years of education: Not on file   Highest education level: Not on file  Occupational History   Not on file  Tobacco Use   Smoking status: Never   Smokeless tobacco: Never  Vaping Use   Vaping Use: Never used  Substance and Sexual Activity   Alcohol use: No   Drug use: No   Sexual activity: Not on file  Other Topics Concern   Not on file  Social History Narrative   Not on file   Social Determinants of Health   Financial Resource Strain: Not on file  Food Insecurity: Not on file  Transportation Needs: Not on file  Physical Activity: Not on file  Stress: Not on file  Social Connections: Not on file     Family History: The patient's family history includes Congenital heart disease in her brother; Heart attack in her brother; Heart disease in her mother; Uterine cancer in her sister. ROS:   Please see the history of present illness.    All other systems reviewed and are negative.  EKGs/Labs/Other Studies Reviewed:    The following studies were reviewed today:   Recent  Labs: 06/08/2020: B Natriuretic Peptide 472.4; BUN 27; Creatinine, Ser 1.09; Hemoglobin 10.0; Platelets 177; Potassium 4.1; Sodium 139  Recent Lipid Panel    Component Value Date/Time   CHOL 229 (H) 02/06/2019 1140   TRIG 164 (H) 02/06/2019 1140   HDL 90 02/06/2019 1140   CHOLHDL 2.5 02/06/2019 1140   LDLCALC 106 (H) 02/06/2019 1140    Physical Exam:    VS:  BP (!) 149/64   Pulse 66   Ht 5' (1.524 m)   Wt 106 lb 9.6 oz (48.4 kg)   SpO2 96%   BMI 20.82 kg/m     Wt Readings from Last 3 Encounters:  04/10/21 106 lb 9.6 oz (48.4 kg)  11/28/20 110 lb (49.9 kg)  10/12/20 110 lb (49.9 kg)     GEN: She appears quite frail well nourished, well developed in no acute distress HEENT: Normal NECK: No JVD; No carotid bruits LYMPHATICS: No lymphadenopathy CARDIAC: 1/6 to 2/6 midsystolic ejection murmur RRR, no murmurs, rubs, gallops RESPIRATORY:  Clear to auscultation without rales, wheezing or rhonchi  ABDOMEN: Soft, non-tender, non-distended MUSCULOSKELETAL:  No edema; No deformity  SKIN: Warm and dry NEUROLOGIC:  Alert and oriented x 3 PSYCHIATRIC:  Normal affect    Signed, Shirlee More, MD  04/10/2021 1:48 PM    [facility]

## 2021-04-10 ENCOUNTER — Encounter: Payer: Self-pay | Admitting: Cardiology

## 2021-04-10 ENCOUNTER — Ambulatory Visit (INDEPENDENT_AMBULATORY_CARE_PROVIDER_SITE_OTHER): Payer: Medicare Other | Admitting: Cardiology

## 2021-04-10 ENCOUNTER — Other Ambulatory Visit: Payer: Self-pay

## 2021-04-10 VITALS — BP 149/64 | HR 66 | Ht 60.0 in | Wt 106.6 lb

## 2021-04-10 DIAGNOSIS — R7989 Other specified abnormal findings of blood chemistry: Secondary | ICD-10-CM | POA: Insufficient documentation

## 2021-04-10 DIAGNOSIS — Z950 Presence of cardiac pacemaker: Secondary | ICD-10-CM

## 2021-04-10 DIAGNOSIS — M1990 Unspecified osteoarthritis, unspecified site: Secondary | ICD-10-CM

## 2021-04-10 DIAGNOSIS — Z952 Presence of prosthetic heart valve: Secondary | ICD-10-CM

## 2021-04-10 DIAGNOSIS — I674 Hypertensive encephalopathy: Secondary | ICD-10-CM

## 2021-04-10 DIAGNOSIS — I442 Atrioventricular block, complete: Secondary | ICD-10-CM

## 2021-04-10 DIAGNOSIS — I129 Hypertensive chronic kidney disease with stage 1 through stage 4 chronic kidney disease, or unspecified chronic kidney disease: Secondary | ICD-10-CM

## 2021-04-10 DIAGNOSIS — T148XXA Other injury of unspecified body region, initial encounter: Secondary | ICD-10-CM

## 2021-04-10 DIAGNOSIS — I10 Essential (primary) hypertension: Secondary | ICD-10-CM

## 2021-04-10 DIAGNOSIS — N189 Chronic kidney disease, unspecified: Secondary | ICD-10-CM

## 2021-04-10 DIAGNOSIS — E782 Mixed hyperlipidemia: Secondary | ICD-10-CM | POA: Diagnosis not present

## 2021-04-10 DIAGNOSIS — N183 Chronic kidney disease, stage 3 unspecified: Secondary | ICD-10-CM

## 2021-04-10 DIAGNOSIS — I130 Hypertensive heart and chronic kidney disease with heart failure and stage 1 through stage 4 chronic kidney disease, or unspecified chronic kidney disease: Secondary | ICD-10-CM

## 2021-04-10 DIAGNOSIS — R42 Dizziness and giddiness: Secondary | ICD-10-CM

## 2021-04-10 HISTORY — DX: Chronic kidney disease, unspecified: N18.9

## 2021-04-10 HISTORY — DX: Essential (primary) hypertension: I10

## 2021-04-10 HISTORY — DX: Other specified abnormal findings of blood chemistry: R79.89

## 2021-04-10 HISTORY — DX: Hypertensive encephalopathy: I67.4

## 2021-04-10 HISTORY — DX: Chronic kidney disease, stage 3 unspecified: N18.30

## 2021-04-10 HISTORY — DX: Dizziness and giddiness: R42

## 2021-04-10 HISTORY — DX: Unspecified osteoarthritis, unspecified site: M19.90

## 2021-04-10 HISTORY — DX: Hypertensive chronic kidney disease with stage 1 through stage 4 chronic kidney disease, or unspecified chronic kidney disease: I12.9

## 2021-04-10 HISTORY — DX: Other injury of unspecified body region, initial encounter: T14.8XXA

## 2021-04-10 NOTE — Patient Instructions (Addendum)
You need someone to do heavy housework.  Medication Instructions:  No medication changes. *If you need a refill on your cardiac medications before your next appointment, please call your pharmacy*   Lab Work: Your physician recommends that you have labs done in the office today. Your test included  CMP and lipids.  If you have labs (blood work) drawn today and your tests are completely normal, you will receive your results only by: [place] (if you have MyChart) OR A paper copy in the mail If you have any lab test that is abnormal or we need to change your treatment, we will call you to review the results.   Testing/Procedures: None ordered   Follow-Up: At [facility], you and your health needs are our priority.  As part of our continuing mission to provide you with exceptional heart care, we have created designated Provider Care Teams.  These Care Teams include your primary Cardiologist (physician) and Advanced Practice Providers (APPs -  Physician Assistants and Nurse Practitioners) who all work together to provide you with the care you need, when you need it.  We recommend signing up for the patient portal called "MyChart".  Sign up information is provided on this After Visit Summary.  MyChart is used to connect with patients for Virtual Visits (Telemedicine).  Patients are able to view lab/test results, encounter notes, upcoming appointments, etc.  Non-urgent messages can be sent to your provider as well.   To learn more about what you can do with MyChart, go to [email or web address].    Your next appointment:   6 month(s)  The format for your next appointment:   In Person  Provider:   Shirlee More, MD   Other Instructions NA

## 2021-04-11 LAB — LIPID PANEL
Chol/HDL Ratio: 2.8 ratio (ref 0.0–4.4)
Cholesterol, Total: 217 mg/dL — ABNORMAL HIGH (ref 100–199)
HDL: 78 mg/dL (ref >39)
LDL Chol Calc (NIH): 108 mg/dL — ABNORMAL HIGH (ref 0–99)
Triglycerides: 180 mg/dL — ABNORMAL HIGH (ref 0–149)
VLDL Cholesterol Cal: 31 mg/dL (ref 5–40)

## 2021-04-11 LAB — COMPREHENSIVE METABOLIC PANEL
ALT: 13 IU/L (ref 0–32)
AST: 33 IU/L (ref 0–40)
Albumin/Globulin Ratio: 1.8 (ref 1.2–2.2)
Albumin: 4.5 g/dL (ref 3.5–4.6)
Alkaline Phosphatase: 56 IU/L (ref 44–121)
BUN/Creatinine Ratio: 27 (ref 12–28)
BUN: 38 mg/dL — ABNORMAL HIGH (ref 10–36)
Bilirubin Total: 0.3 mg/dL (ref 0.0–1.2)
CO2: 28 mmol/L (ref 20–29)
Calcium: 9.9 mg/dL (ref 8.7–10.3)
Chloride: 100 mmol/L (ref 96–106)
Creatinine, Ser: 1.39 mg/dL — ABNORMAL HIGH (ref 0.57–1.00)
Globulin, Total: 2.5 g/dL (ref 1.5–4.5)
Glucose: 98 mg/dL (ref 65–99)
Potassium: 4.8 mmol/L (ref 3.5–5.2)
Sodium: 142 mmol/L (ref 134–144)
Total Protein: 7 g/dL (ref 6.0–8.5)
eGFR: 36 mL/min/{1.73_m2} — ABNORMAL LOW (ref >59)

## 2021-04-11 NOTE — Progress Notes (Signed)
Remote pacemaker transmission.   

## 2021-05-03 ENCOUNTER — Ambulatory Visit (HOSPITAL_COMMUNITY): Payer: Medicare Other

## 2021-05-17 ENCOUNTER — Ambulatory Visit (HOSPITAL_COMMUNITY)
Admission: RE | Admit: 2021-05-17 | Discharge: 2021-05-17 | Disposition: A | Discharge status: Home / Self Care | Payer: Medicare Other | Source: Ambulatory Visit | Attending: Surgery | Admitting: Surgery

## 2021-05-17 ENCOUNTER — Other Ambulatory Visit: Payer: Self-pay

## 2021-05-17 DIAGNOSIS — N281 Cyst of kidney, acquired: Secondary | ICD-10-CM | POA: Diagnosis not present

## 2021-05-17 DIAGNOSIS — R932 Abnormal findings on diagnostic imaging of liver and biliary tract: Secondary | ICD-10-CM | POA: Insufficient documentation

## 2021-05-17 DIAGNOSIS — K862 Cyst of pancreas: Secondary | ICD-10-CM | POA: Diagnosis not present

## 2021-05-17 MED ORDER — GADOBUTROL 1 MMOL/ML IV SOLN
4.0000 mL | Freq: Once | INTRAVENOUS | Status: AC | PRN
  Administered 2021-05-17: 4 mL via INTRAVENOUS

## 2021-05-24 DIAGNOSIS — R932 Abnormal findings on diagnostic imaging of liver and biliary tract: Secondary | ICD-10-CM | POA: Diagnosis not present

## 2021-05-28 ENCOUNTER — Other Ambulatory Visit: Payer: Self-pay | Admitting: Cardiology

## 2021-06-06 DIAGNOSIS — E7800 Pure hypercholesterolemia, unspecified: Secondary | ICD-10-CM | POA: Diagnosis not present

## 2021-06-06 DIAGNOSIS — I10 Essential (primary) hypertension: Secondary | ICD-10-CM | POA: Diagnosis not present

## 2021-06-06 DIAGNOSIS — Z780 Asymptomatic menopausal state: Secondary | ICD-10-CM | POA: Diagnosis not present

## 2021-06-06 DIAGNOSIS — Z79899 Other long term (current) drug therapy: Secondary | ICD-10-CM | POA: Diagnosis not present

## 2021-06-06 DIAGNOSIS — Z1331 Encounter for screening for depression: Secondary | ICD-10-CM | POA: Diagnosis not present

## 2021-06-06 DIAGNOSIS — Z13820 Encounter for screening for osteoporosis: Secondary | ICD-10-CM | POA: Diagnosis not present

## 2021-06-06 DIAGNOSIS — Z6821 Body mass index (BMI) 21.0-21.9, adult: Secondary | ICD-10-CM | POA: Diagnosis not present

## 2021-06-06 DIAGNOSIS — Z0000 Encounter for general adult medical examination without abnormal findings: Secondary | ICD-10-CM | POA: Diagnosis not present

## 2021-06-21 ENCOUNTER — Ambulatory Visit (INDEPENDENT_AMBULATORY_CARE_PROVIDER_SITE_OTHER): Payer: Medicare Other

## 2021-06-21 DIAGNOSIS — I442 Atrioventricular block, complete: Secondary | ICD-10-CM | POA: Diagnosis not present

## 2021-06-21 LAB — CUP PACEART REMOTE DEVICE CHECK
Battery Remaining Longevity: 94 mo
Battery Remaining Percentage: 76 %
Battery Voltage: 3.01 V
Brady Statistic AP VP Percent: 1 %
Brady Statistic AP VS Percent: 24 %
Brady Statistic AS VP Percent: 1 %
Brady Statistic AS VS Percent: 76 %
Brady Statistic RA Percent Paced: 23 %
Brady Statistic RV Percent Paced: 1 %
Date Time Interrogation Session: 20220921020013
Implantable Lead Implant Date: 20190620
Implantable Lead Implant Date: 20190620
Implantable Lead Location: 753859
Implantable Lead Location: 753860
Implantable Pulse Generator Implant Date: 20190620
Lead Channel Impedance Value: 350 Ohm
Lead Channel Impedance Value: 480 Ohm
Lead Channel Pacing Threshold Amplitude: 0.625 V
Lead Channel Pacing Threshold Amplitude: 0.75 V
Lead Channel Pacing Threshold Pulse Width: 0.5 ms
Lead Channel Pacing Threshold Pulse Width: 0.5 ms
Lead Channel Sensing Intrinsic Amplitude: 10 mV
Lead Channel Sensing Intrinsic Amplitude: 3.8 mV
Lead Channel Setting Pacing Amplitude: 0.875
Lead Channel Setting Pacing Amplitude: 2 V
Lead Channel Setting Pacing Pulse Width: 0.5 ms
Lead Channel Setting Sensing Sensitivity: 2 mV
Pulse Gen Model: 2272
Pulse Gen Serial Number: 9035930

## 2021-06-28 NOTE — Progress Notes (Signed)
Remote pacemaker transmission.   

## 2021-06-29 DIAGNOSIS — I129 Hypertensive chronic kidney disease with stage 1 through stage 4 chronic kidney disease, or unspecified chronic kidney disease: Secondary | ICD-10-CM | POA: Diagnosis not present

## 2021-06-29 DIAGNOSIS — R0789 Other chest pain: Secondary | ICD-10-CM | POA: Diagnosis not present

## 2021-06-29 DIAGNOSIS — E7800 Pure hypercholesterolemia, unspecified: Secondary | ICD-10-CM | POA: Diagnosis not present

## 2021-06-29 DIAGNOSIS — R531 Weakness: Secondary | ICD-10-CM | POA: Diagnosis not present

## 2021-06-29 DIAGNOSIS — Z950 Presence of cardiac pacemaker: Secondary | ICD-10-CM | POA: Diagnosis not present

## 2021-06-29 DIAGNOSIS — R42 Dizziness and giddiness: Secondary | ICD-10-CM | POA: Diagnosis not present

## 2021-06-29 DIAGNOSIS — N183 Chronic kidney disease, stage 3 unspecified: Secondary | ICD-10-CM | POA: Diagnosis not present

## 2021-06-29 DIAGNOSIS — N189 Chronic kidney disease, unspecified: Secondary | ICD-10-CM | POA: Diagnosis not present

## 2021-06-29 DIAGNOSIS — I10 Essential (primary) hypertension: Secondary | ICD-10-CM | POA: Diagnosis not present

## 2021-06-29 DIAGNOSIS — G4489 Other headache syndrome: Secondary | ICD-10-CM | POA: Diagnosis not present

## 2021-07-03 DIAGNOSIS — L299 Pruritus, unspecified: Secondary | ICD-10-CM | POA: Diagnosis not present

## 2021-07-03 DIAGNOSIS — L209 Atopic dermatitis, unspecified: Secondary | ICD-10-CM | POA: Diagnosis not present

## 2021-07-04 DIAGNOSIS — Z6821 Body mass index (BMI) 21.0-21.9, adult: Secondary | ICD-10-CM | POA: Diagnosis not present

## 2021-07-04 DIAGNOSIS — N189 Chronic kidney disease, unspecified: Secondary | ICD-10-CM | POA: Diagnosis not present

## 2021-07-04 DIAGNOSIS — I10 Essential (primary) hypertension: Secondary | ICD-10-CM | POA: Diagnosis not present

## 2021-07-04 DIAGNOSIS — I509 Heart failure, unspecified: Secondary | ICD-10-CM | POA: Diagnosis not present

## 2021-07-04 DIAGNOSIS — Z23 Encounter for immunization: Secondary | ICD-10-CM | POA: Diagnosis not present

## 2021-07-24 DIAGNOSIS — L209 Atopic dermatitis, unspecified: Secondary | ICD-10-CM | POA: Diagnosis not present

## 2021-07-24 DIAGNOSIS — L219 Seborrheic dermatitis, unspecified: Secondary | ICD-10-CM | POA: Diagnosis not present

## 2021-07-24 DIAGNOSIS — L719 Rosacea, unspecified: Secondary | ICD-10-CM | POA: Diagnosis not present

## 2021-08-03 DIAGNOSIS — J40 Bronchitis, not specified as acute or chronic: Secondary | ICD-10-CM | POA: Diagnosis not present

## 2021-08-03 DIAGNOSIS — Z6821 Body mass index (BMI) 21.0-21.9, adult: Secondary | ICD-10-CM | POA: Diagnosis not present

## 2021-08-03 DIAGNOSIS — J329 Chronic sinusitis, unspecified: Secondary | ICD-10-CM | POA: Diagnosis not present

## 2021-08-03 DIAGNOSIS — Z20828 Contact with and (suspected) exposure to other viral communicable diseases: Secondary | ICD-10-CM | POA: Diagnosis not present

## 2021-08-10 DIAGNOSIS — I739 Peripheral vascular disease, unspecified: Secondary | ICD-10-CM | POA: Diagnosis not present

## 2021-08-10 DIAGNOSIS — Z79899 Other long term (current) drug therapy: Secondary | ICD-10-CM | POA: Diagnosis not present

## 2021-08-16 DIAGNOSIS — E0590 Thyrotoxicosis, unspecified without thyrotoxic crisis or storm: Secondary | ICD-10-CM | POA: Diagnosis not present

## 2021-08-28 DIAGNOSIS — I10 Essential (primary) hypertension: Secondary | ICD-10-CM | POA: Diagnosis not present

## 2021-08-28 DIAGNOSIS — M19041 Primary osteoarthritis, right hand: Secondary | ICD-10-CM | POA: Diagnosis not present

## 2021-08-28 DIAGNOSIS — S61419A Laceration without foreign body of unspecified hand, initial encounter: Secondary | ICD-10-CM | POA: Diagnosis not present

## 2021-08-28 DIAGNOSIS — W19XXXA Unspecified fall, initial encounter: Secondary | ICD-10-CM | POA: Diagnosis not present

## 2021-08-28 DIAGNOSIS — R0789 Other chest pain: Secondary | ICD-10-CM | POA: Diagnosis not present

## 2021-08-28 DIAGNOSIS — R109 Unspecified abdominal pain: Secondary | ICD-10-CM | POA: Diagnosis not present

## 2021-08-28 DIAGNOSIS — G319 Degenerative disease of nervous system, unspecified: Secondary | ICD-10-CM | POA: Diagnosis not present

## 2021-08-28 DIAGNOSIS — S61412A Laceration without foreign body of left hand, initial encounter: Secondary | ICD-10-CM | POA: Diagnosis not present

## 2021-08-28 DIAGNOSIS — M79642 Pain in left hand: Secondary | ICD-10-CM | POA: Diagnosis not present

## 2021-08-28 DIAGNOSIS — M47812 Spondylosis without myelopathy or radiculopathy, cervical region: Secondary | ICD-10-CM | POA: Diagnosis not present

## 2021-08-28 DIAGNOSIS — M4312 Spondylolisthesis, cervical region: Secondary | ICD-10-CM | POA: Diagnosis not present

## 2021-08-28 DIAGNOSIS — K5900 Constipation, unspecified: Secondary | ICD-10-CM | POA: Diagnosis not present

## 2021-08-28 DIAGNOSIS — S61411A Laceration without foreign body of right hand, initial encounter: Secondary | ICD-10-CM | POA: Diagnosis not present

## 2021-08-28 DIAGNOSIS — Z043 Encounter for examination and observation following other accident: Secondary | ICD-10-CM | POA: Diagnosis not present

## 2021-08-28 DIAGNOSIS — S51011A Laceration without foreign body of right elbow, initial encounter: Secondary | ICD-10-CM | POA: Diagnosis not present

## 2021-08-28 DIAGNOSIS — S0990XA Unspecified injury of head, initial encounter: Secondary | ICD-10-CM | POA: Diagnosis not present

## 2021-08-28 DIAGNOSIS — S59901A Unspecified injury of right elbow, initial encounter: Secondary | ICD-10-CM | POA: Diagnosis not present

## 2021-08-28 DIAGNOSIS — R58 Hemorrhage, not elsewhere classified: Secondary | ICD-10-CM | POA: Diagnosis not present

## 2021-09-05 DIAGNOSIS — Z6821 Body mass index (BMI) 21.0-21.9, adult: Secondary | ICD-10-CM | POA: Diagnosis not present

## 2021-09-05 DIAGNOSIS — M79671 Pain in right foot: Secondary | ICD-10-CM | POA: Diagnosis not present

## 2021-09-05 DIAGNOSIS — M79672 Pain in left foot: Secondary | ICD-10-CM | POA: Diagnosis not present

## 2021-09-05 DIAGNOSIS — I10 Essential (primary) hypertension: Secondary | ICD-10-CM | POA: Diagnosis not present

## 2021-09-07 DIAGNOSIS — N183 Chronic kidney disease, stage 3 unspecified: Secondary | ICD-10-CM | POA: Diagnosis not present

## 2021-09-07 DIAGNOSIS — N189 Chronic kidney disease, unspecified: Secondary | ICD-10-CM | POA: Diagnosis not present

## 2021-09-07 DIAGNOSIS — L03116 Cellulitis of left lower limb: Secondary | ICD-10-CM | POA: Diagnosis not present

## 2021-09-07 DIAGNOSIS — I129 Hypertensive chronic kidney disease with stage 1 through stage 4 chronic kidney disease, or unspecified chronic kidney disease: Secondary | ICD-10-CM | POA: Diagnosis not present

## 2021-09-08 ENCOUNTER — Other Ambulatory Visit: Payer: Self-pay | Admitting: Cardiology

## 2021-09-11 NOTE — Telephone Encounter (Signed)
Eplerenone 25 mg # 90 x 3 refills sent to  [place], [place]

## 2021-09-15 DIAGNOSIS — I7300 Raynaud's syndrome without gangrene: Secondary | ICD-10-CM | POA: Diagnosis not present

## 2021-09-15 DIAGNOSIS — Z6821 Body mass index (BMI) 21.0-21.9, adult: Secondary | ICD-10-CM | POA: Diagnosis not present

## 2021-09-15 DIAGNOSIS — I10 Essential (primary) hypertension: Secondary | ICD-10-CM | POA: Diagnosis not present

## 2021-09-15 DIAGNOSIS — S6990XA Unspecified injury of unspecified wrist, hand and finger(s), initial encounter: Secondary | ICD-10-CM | POA: Diagnosis not present

## 2021-09-20 ENCOUNTER — Ambulatory Visit (INDEPENDENT_AMBULATORY_CARE_PROVIDER_SITE_OTHER): Payer: Medicare Other

## 2021-09-20 DIAGNOSIS — I442 Atrioventricular block, complete: Secondary | ICD-10-CM | POA: Diagnosis not present

## 2021-09-20 LAB — CUP PACEART REMOTE DEVICE CHECK
Battery Remaining Longevity: 94 mo
Battery Remaining Percentage: 74 %
Battery Voltage: 3.01 V
Brady Statistic AP VP Percent: 1 %
Brady Statistic AP VS Percent: 18 %
Brady Statistic AS VP Percent: 1 %
Brady Statistic AS VS Percent: 82 %
Brady Statistic RA Percent Paced: 18 %
Brady Statistic RV Percent Paced: 1 %
Date Time Interrogation Session: 20221221020014
Implantable Lead Implant Date: 20190620
Implantable Lead Implant Date: 20190620
Implantable Lead Location: 753859
Implantable Lead Location: 753860
Implantable Pulse Generator Implant Date: 20190620
Lead Channel Impedance Value: 380 Ohm
Lead Channel Impedance Value: 510 Ohm
Lead Channel Pacing Threshold Amplitude: 0.625 V
Lead Channel Pacing Threshold Amplitude: 0.75 V
Lead Channel Pacing Threshold Pulse Width: 0.5 ms
Lead Channel Pacing Threshold Pulse Width: 0.5 ms
Lead Channel Sensing Intrinsic Amplitude: 10.4 mV
Lead Channel Sensing Intrinsic Amplitude: 3.9 mV
Lead Channel Setting Pacing Amplitude: 0.875
Lead Channel Setting Pacing Amplitude: 2 V
Lead Channel Setting Pacing Pulse Width: 0.5 ms
Lead Channel Setting Sensing Sensitivity: 2 mV
Pulse Gen Model: 2272
Pulse Gen Serial Number: 9035930

## 2021-09-21 DIAGNOSIS — K589 Irritable bowel syndrome without diarrhea: Secondary | ICD-10-CM | POA: Diagnosis not present

## 2021-09-21 DIAGNOSIS — R110 Nausea: Secondary | ICD-10-CM | POA: Diagnosis not present

## 2021-09-21 DIAGNOSIS — Z6821 Body mass index (BMI) 21.0-21.9, adult: Secondary | ICD-10-CM | POA: Diagnosis not present

## 2021-09-21 DIAGNOSIS — R5381 Other malaise: Secondary | ICD-10-CM | POA: Diagnosis not present

## 2021-09-29 NOTE — Progress Notes (Signed)
Remote pacemaker transmission.   

## 2021-10-11 DIAGNOSIS — H04123 Dry eye syndrome of bilateral lacrimal glands: Secondary | ICD-10-CM | POA: Diagnosis not present

## 2021-10-18 ENCOUNTER — Encounter: Payer: Self-pay | Admitting: Cardiology

## 2021-10-18 ENCOUNTER — Ambulatory Visit (INDEPENDENT_AMBULATORY_CARE_PROVIDER_SITE_OTHER): Payer: Medicare Other | Admitting: Cardiology

## 2021-10-18 ENCOUNTER — Other Ambulatory Visit: Payer: Self-pay

## 2021-10-18 VITALS — BP 182/52 | HR 68 | Ht 60.0 in | Wt 109.2 lb

## 2021-10-18 DIAGNOSIS — Z950 Presence of cardiac pacemaker: Secondary | ICD-10-CM | POA: Diagnosis not present

## 2021-10-18 DIAGNOSIS — N183 Chronic kidney disease, stage 3 unspecified: Secondary | ICD-10-CM | POA: Diagnosis not present

## 2021-10-18 DIAGNOSIS — M79671 Pain in right foot: Secondary | ICD-10-CM | POA: Diagnosis not present

## 2021-10-18 DIAGNOSIS — M79672 Pain in left foot: Secondary | ICD-10-CM

## 2021-10-18 DIAGNOSIS — Z952 Presence of prosthetic heart valve: Secondary | ICD-10-CM | POA: Diagnosis not present

## 2021-10-18 DIAGNOSIS — I130 Hypertensive heart and chronic kidney disease with heart failure and stage 1 through stage 4 chronic kidney disease, or unspecified chronic kidney disease: Secondary | ICD-10-CM | POA: Diagnosis not present

## 2021-10-18 DIAGNOSIS — I442 Atrioventricular block, complete: Secondary | ICD-10-CM

## 2021-10-18 HISTORY — DX: Pain in right foot: M79.672

## 2021-10-18 HISTORY — DX: Pain in right foot: M79.671

## 2021-10-18 NOTE — Patient Instructions (Signed)

## 2021-10-18 NOTE — Progress Notes (Signed)
Cardiology Office Note:    Date:  10/18/2021   ID:  Kaitlin Villarreal, DOB 1925/11/05, MRN [id number]  PCP:  Kaitlin Sheriff, MD  Cardiologist:  Kaitlin More, MD    Referring MD: Kaitlin Sheriff, MD    ASSESSMENT:    1. Bilateral foot pain   2. S/P TAVR (transcatheter aortic valve replacement)   3. 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 Pacemaker   5. 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 Stage 3 chronic kidney disease, unspecified whether stage 3a or 3b CKD ([place])    PLAN:    In order of problems listed above:  She is having symptoms and I am concerned that this is rest arterial ischemia worsened by being recumbent.  We will do a quick lower extremity arterial duplex in the office and officially if severely abnormal need to see vascular surgery.  She has monophasic flow at the left ankle and I think she is best served by referral to vascular surgery for full evaluation. Stable her heart failure is well compensated continue her loop diuretic in spring continue to trend home blood pressures she is on multiple antihypertensives. Stable pacemaker follow-up in our device clinic Stable most recent creatinine 08/10/2021 1.6 hemoglobin 11.6 potassium four-point Stable hyperlipidemia continue her statin   Next appointment: 6 months   Medication Adjustments/Labs and Tests Ordered: Current medicines are reviewed at length with the patient today.  Concerns regarding medicines are outlined above.  Orders Placed This Encounter  Procedures   VAS [place] ABI WITH/WO TBI   No orders of the defined types were placed in this encounter.   Chief Complaint  Patient presents with   Follow-up    After TAVR  Complaint is bilateral foot pain at nighttime  History of Present Illness:    Kaitlin Villarreal is a 88 y.o. female with a hx of  severe symptomatic calcific aortic stenosis with TAVR June 2019 permanent pacemaker following TAVR  for heart block hypertensive heart disease with chronic kidney disease  last seen 04/10/2021.  Compliance with diet, lifestyle and medications: Yes  Kaitlin Villarreal's wife are adamant that he must do something today for her She is waiting to see a rheumatologist in April She has progressive severe foot pain bilaterally from the midfoot forward very severe it occurs when she is supine and is alleviated when she stands and walks.  She is not having block claudication.  The toes are discolored she said she has had some small ulcerations and was told years ago she had Raynaud's disease.  Her records [facility] peripheral arterial disease has a diagnosis  She has had testing at [facility] in the ED 06/29/2021 chest x-ray showed no acute abnormalities there was aortic atherosclerosis noted tender EKG showed sinus rhythm left atrial enlargement and nonspecific ST-T change independently reviewed by me  Her last download 09/20/2021 showed brief PAT normal battery projected 94 months and lead parameters  Quick look vascular chest the patient have triphasic flow on the right and monophasic flow left foot and think she is best served by referral to vascular surgery. Past Medical History:  Diagnosis Date   Acute on chronic diastolic heart failure ([place]) 03/18/2018   Aortic stenosis 10/14/2014   Formatting of this note might be different from the original.  moderate by echo 11/2015   Arthritis    Bilateral renal masses 07/26/2017   Bone  spur of toe of left foot    Chronic diastolic (congestive) heart failure (HCC)    Chronic diastolic heart failure ([place]) 03/18/2018   CKD (chronic kidney disease) stage 3, GFR 30-59 ml/min (HCC) 07/26/2017   Essential hypertension    GERD (gastroesophageal reflux disease)    HLD (hyperlipidemia)    Hyperlipidemia 11/25/2016   Pacemaker 09/02/2018   Pancreatic lesion 04/17/2018   Pancreatic mass    a. benign appearing but needs f/u, noted on pre TAVR CTs    Plantar fat pad atrophy of left foot    S/P TAVR (transcatheter aortic valve replacement) 03/18/2018   23 mm Edwards Sapien 3 transcatheter heart valve placed via percutaneous right transfemoral approach    Severe aortic stenosis    a. 03/2018: s/p TAVR by Burt Knack and Dr. Roxy Manns   Stage 3 chronic kidney disease ([place])    TIA (transient ischemic attack)     a. 1992   VSD (ventricular septal defect)     Past Surgical History:  Procedure Laterality Date   ABDOMINAL HYSTERECTOMY     APPENDECTOMY     HERNIA REPAIR     INTRAOPERATIVE TRANSTHORACIC ECHOCARDIOGRAM N/A 03/18/2018   Procedure: INTRAOPERATIVE TRANSTHORACIC ECHOCARDIOGRAM;  Surgeon: Sherren Mocha, MD;  Location: [place];  Service: Open Heart Surgery;  Laterality: N/A;   PACEMAKER IMPLANT N/A 03/20/2018   Procedure: PACEMAKER IMPLANT;  Surgeon: Constance Haw, MD;  Location: [place] [facility];  Service: Cardiovascular;  Laterality: N/A;   RIGHT HEART CATH N/A 03/20/2018   Procedure: RIGHT HEART CATH;  Surgeon: Sherren Mocha, MD;  Location: [place] [facility];  Service: Cardiovascular;  Laterality: N/A;   RIGHT/LEFT HEART CATH AND CORONARY ANGIOGRAPHY N/A 02/06/2018   Procedure: RIGHT/LEFT HEART CATH AND CORONARY ANGIOGRAPHY;  Surgeon: Sherren Mocha, MD;  Location: [place] [facility];  Service: Cardiovascular;  Laterality: N/A;   TONSILLECTOMY     TRANSCATHETER AORTIC VALVE REPLACEMENT, TRANSFEMORAL N/A 03/18/2018   Procedure: TRANSCATHETER AORTIC VALVE REPLACEMENT, TRANSFEMORAL;  Surgeon: Sherren Mocha, MD;  Location: [place];  Service: Open Heart Surgery;  Laterality: N/A;   WISDOM TOOTH EXTRACTION      Current Medications: Current Meds  Medication Sig   acetaminophen (TYLENOL) 650 MG CR tablet Take 650-1,300 mg by mouth every 8 (eight) hours as needed for pain.   amoxicillin (AMOXIL) 500 MG tablet Take 4 tablets (2,000 mg total) by mouth as directed. 1 hour prior to dental work including cleanings   aspirin EC 81 MG tablet  Take 81 mg by mouth daily.    Carboxymethylcellulose Sod PF (THERATEARS PF) 0.25 % SOLN Place 1 drop into both eyes in the morning and at bedtime.   Cyanocobalamin (B-12) 2500 MCG TABS Take 2,500 mcg by mouth daily.    eplerenone (INSPRA) 25 MG tablet Take 1 tablet by mouth once daily   estradiol (ESTRACE) 1 MG tablet Take 0.5 mg by mouth daily.   famotidine (PEPCID) 20 MG tablet Take 20 mg by mouth as needed for heartburn or indigestion.   fluticasone (FLONASE) 50 MCG/ACT nasal spray Place 2 sprays into both nostrils daily as needed for allergies or rhinitis.    furosemide (LASIX) 20 MG tablet Take 20 mg by mouth daily.   hydrALAZINE (APRESOLINE) 25 MG tablet Take 25 mg by mouth 3 (three) times daily as needed. When BP over 180   lisinopril (ZESTRIL) 20 MG tablet Take 1 tablet by mouth once daily   metoprolol tartrate (LOPRESSOR) 50 MG tablet Take 50 mg by  mouth daily.   Multiple Vitamins-Minerals (MULTI-VITAMIN GUMMIES) CHEW Chew 2 tablets by mouth daily.    Omega-3 Fatty Acids (FISH OIL) 1000 MG CAPS Take 1,000 mg by mouth daily.   pravastatin (PRAVACHOL) 20 MG tablet Take 20 mg by mouth at bedtime.    Probiotic Product ([employer]S PROBIOTIC) CHEW Chew 2 each by mouth daily after supper.    triamcinolone cream (KENALOG) 0.5 % Apply 3 application topically 3 (three) times daily as needed (for itching- AFFECTED AREAS).    Vitamin D, Ergocalciferol, (DRISDOL) 1.25 MG (50000 UT) CAPS capsule Take 50,000 Units by mouth See admin instructions. Take 50,000 units by mouth 2 times a week- Mondays and Fridays     Allergies:   Clarithromycin, Latex, Levofloxacin, Azithromycin, and Ivp dye [iodinated contrast media]   Social History   Socioeconomic History   Marital status: Married    Spouse name: Not on file   Number of children: Not on file   Years of education: Not on file   Highest education level: Not on file  Occupational History   Not on file  Tobacco Use   Smoking status: Never    Passive  exposure: Never   Smokeless tobacco: Never  Vaping Use   Vaping Use: Never used  Substance and Sexual Activity   Alcohol use: No   Drug use: No   Sexual activity: Not on file  Other Topics Concern   Not on file  Social History Narrative   Not on file   Social Determinants of Health   Financial Resource Strain: Not on file  Food Insecurity: Not on file  Transportation Needs: Not on file  Physical Activity: Not on file  Stress: Not on file  Social Connections: Not on file     Family History: The patient's family history includes Congenital heart disease in her brother; Heart attack in her brother; Heart disease in her mother; Uterine cancer in her sister. ROS:   Please see the history of present illness.    All other systems reviewed and are negative.  EKGs/Labs/Other Studies Reviewed:    The following studies were reviewed today:    Recent Labs: 04/10/2021: ALT 13; BUN 38; Creatinine, Ser 1.39; Potassium 4.8; Sodium 142  Recent Lipid Panel    Component Value Date/Time   CHOL 217 (H) 04/10/2021 1357   TRIG 180 (H) 04/10/2021 1357   HDL 78 04/10/2021 1357   CHOLHDL 2.8 04/10/2021 1357   LDLCALC 108 (H) 04/10/2021 1357    Physical Exam:    VS:  BP (!) 182/52 (BP Location: Right Arm)    Pulse 68    Ht 5' (1.524 m)    Wt 109 lb 3.2 oz (49.5 kg)    SpO2 98%    BMI 21.33 kg/m     Wt Readings from Last 3 Encounters:  10/18/21 109 lb 3.2 oz (49.5 kg)  04/10/21 106 lb 9.6 oz (48.4 kg)  11/28/20 110 lb (49.9 kg)     GEN:  Well nourished, well developed in no acute distress HEENT: Normal NECK: No JVD; No carotid bruits LYMPHATICS: No lymphadenopathy CARDIAC: RRR, murmurs, rubs, gallops RESPIRATORY:  Clear to auscultation without rales, wheezing or rhonchi  ABDOMEN: Soft, non-tender, non-distended MUSCULOSKELETAL:  No edema; No deformity  Pallor and coolness of the feet bilaterally and she is cold from the mid calf down I cannot palpate foot pulses she has good  femoral of the popliteals bilateral NEUROLOGIC:  Alert and oriented x 3 PSYCHIATRIC:  Normal affect  Signed, Kaitlin More, MD  10/18/2021 3:06 PM    [facility]

## 2021-10-25 NOTE — Progress Notes (Signed)
Office Note     CC:  LLE foot pain Requesting Provider:  Angelina Sheriff, MD  HPI: Kaitlin Villarreal is a 88 y.o. (09-19-1926) female with history of TVAR, pacemaker due to 3rd degree AV block, CKD3 presenting at the request of .Jacqlyn Larsen II, MD for bilateral lower extremity leg pain.  Roughly 1 month ago, Kaitlin Villarreal had bilateral lower extremity pain appreciated in her feet.  The right foot pain resolved, however she continued to have left foot pain radiating from her first digit. Kaitlin Villarreal appreciated swelling on the dorsal aspect of her left foot.  There was associated erythema, no skin break on wound.  The toe pain and swelling have made it difficult for her to wear normal shoes.  She denied fevers, chills. Kaitlin Villarreal denied history of claudication, ischemic rest pain, tissue loss.  The pt is  on a statin for cholesterol management.  The pt is  on a daily aspirin.   Other AC:  - The pt is  on medication for hypertension.   The pt is not diabetic.  Tobacco hx:  none  Past Medical History:  Diagnosis Date   Acute on chronic diastolic heart failure ([place]) 03/18/2018   Aortic stenosis 10/14/2014   Formatting of this note might be different from the original.  moderate by echo 11/2015   Arthritis    Bilateral renal masses 07/26/2017   Bone spur of toe of left foot    Chronic diastolic (congestive) heart failure (HCC)    Chronic diastolic heart failure ([place]) 03/18/2018   CKD (chronic kidney disease) stage 3, GFR 30-59 ml/min (HCC) 07/26/2017   Essential hypertension    GERD (gastroesophageal reflux disease)    HLD (hyperlipidemia)    Hyperlipidemia 11/25/2016   Pacemaker 09/02/2018   Pancreatic lesion 04/17/2018   Pancreatic mass    a. 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 03/2018: s/p TAVR by Burt Knack and Dr. Roxy Manns   Stage 3 chronic kidney disease ([place])    TIA (transient ischemic attack)     a. 1992   VSD (ventricular septal defect)     Past Surgical History:  Procedure Laterality Date   ABDOMINAL HYSTERECTOMY     APPENDECTOMY     HERNIA REPAIR     INTRAOPERATIVE TRANSTHORACIC ECHOCARDIOGRAM N/A 03/18/2018   Procedure: INTRAOPERATIVE TRANSTHORACIC ECHOCARDIOGRAM;  Surgeon: Sherren Mocha, MD;  Location: [place];  Service: Open Heart Surgery;  Laterality: N/A;   PACEMAKER IMPLANT N/A 03/20/2018   Procedure: PACEMAKER IMPLANT;  Surgeon: Constance Haw, MD;  Location: [place] [facility];  Service: Cardiovascular;  Laterality: N/A;   RIGHT HEART CATH N/A 03/20/2018   Procedure: RIGHT HEART CATH;  Surgeon: Sherren Mocha, MD;  Location: [place] [facility];  Service: Cardiovascular;  Laterality: N/A;   RIGHT/LEFT HEART CATH AND CORONARY ANGIOGRAPHY N/A 02/06/2018   Procedure: RIGHT/LEFT HEART CATH AND CORONARY ANGIOGRAPHY;  Surgeon: Sherren Mocha, MD;  Location: [place] [facility];  Service: Cardiovascular;  Laterality: N/A;   TONSILLECTOMY     TRANSCATHETER AORTIC VALVE REPLACEMENT, TRANSFEMORAL N/A 03/18/2018   Procedure: TRANSCATHETER AORTIC VALVE REPLACEMENT, TRANSFEMORAL;  Surgeon: Sherren Mocha, MD;  Location: [place];  Service: Open Heart Surgery;  Laterality: N/A;  WISDOM TOOTH EXTRACTION      Social History   Socioeconomic History   Marital status: Married    Spouse name: Not on file   Number of children: Not on file   Years of education: Not on file   Highest education level: Not on file  Occupational History   Not on file  Tobacco Use   Smoking status: Never    Passive exposure: Never   Smokeless tobacco: Never  Vaping Use   Vaping Use: Never used  Substance and Sexual Activity   Alcohol use: No   Drug use: No   Sexual activity: Not on file  Other Topics Concern   Not on file  Social History Narrative    Not on file   Social Determinants of Health   Financial Resource Strain: Not on file  Food Insecurity: Not on file  Transportation Needs: Not on file  Physical Activity: Not on file  Stress: Not on file  Social Connections: Not on file  Intimate Partner Violence: Not on file    Family History  Problem Relation Age of Onset   Heart disease Mother    Uterine cancer Sister    Congenital heart disease Brother    Heart attack Brother     Current Outpatient Medications  Medication Sig Dispense Refill   acetaminophen (TYLENOL) 650 MG CR tablet Take 650-1,300 mg by mouth every 8 (eight) hours as needed for pain.     amoxicillin (AMOXIL) 500 MG tablet Take 4 tablets (2,000 mg total) by mouth as directed. 1 hour prior to dental work including cleanings 12 tablet 12   aspirin EC 81 MG tablet Take 81 mg by mouth daily.      Carboxymethylcellulose Sod PF (THERATEARS PF) 0.25 % SOLN Place 1 drop into both eyes in the morning and at bedtime.     Cyanocobalamin (B-12) 2500 MCG TABS Take 2,500 mcg by mouth daily.      eplerenone (INSPRA) 25 MG tablet Take 1 tablet by mouth once daily 90 tablet 3   estradiol (ESTRACE) 1 MG tablet Take 0.5 mg by mouth daily.     famotidine (PEPCID) 20 MG tablet Take 20 mg by mouth as needed for heartburn or indigestion.     fluticasone (FLONASE) 50 MCG/ACT nasal spray Place 2 sprays into both nostrils daily as needed for allergies or rhinitis.      furosemide (LASIX) 20 MG tablet Take 20 mg by mouth daily.     hydrALAZINE (APRESOLINE) 25 MG tablet Take 25 mg by mouth 3 (three) times daily as needed. When BP over 180     lisinopril (ZESTRIL) 20 MG tablet Take 1 tablet by mouth once daily 90 tablet 2   metoprolol tartrate (LOPRESSOR) 50 MG tablet Take 50 mg by mouth daily.     Multiple Vitamins-Minerals (MULTI-VITAMIN GUMMIES) CHEW Chew 2 tablets by mouth daily.      Omega-3 Fatty Acids (FISH OIL) 1000 MG CAPS Take 1,000 mg by mouth daily.     pravastatin  (PRAVACHOL) 20 MG tablet Take 20 mg by mouth at bedtime.      Probiotic Product ([employer]S PROBIOTIC) CHEW Chew 2 each by mouth daily after supper.      triamcinolone cream (KENALOG) 0.5 % Apply 3 application topically 3 (three) times daily as needed (for itching- AFFECTED AREAS).      Vitamin D, Ergocalciferol, (DRISDOL) 1.25 MG (50000 UT) CAPS capsule Take 50,000 Units by mouth See admin instructions. Take 50,000 units by mouth 2  times a week- Mondays and Fridays  1   No current facility-administered medications for this visit.    Allergies  Allergen Reactions   Clarithromycin Other (See Comments)    Felt like body was swollen, felt awful"- and, angioedema   Latex Rash and Other (See Comments)    Causes sores and Rash-Generalized   Levofloxacin Other (See Comments)    Hallucinations    Azithromycin Other (See Comments)    Reaction??   Ivp Dye [Iodinated Contrast Media]      REVIEW OF SYSTEMS:   [X]  denotes positive finding, [ ]  denotes negative finding Cardiac  Comments:  Chest pain or chest pressure:    Shortness of breath upon exertion:    Short of breath when lying flat:    Irregular heart rhythm:        Vascular    Pain in calf, thigh, or hip brought on by ambulation:    Pain in feet at night that wakes you up from your sleep:     Blood clot in your veins:    Leg swelling:         Pulmonary    Oxygen at home:    Productive cough:     Wheezing:         Neurologic    Sudden weakness in arms or legs:     Sudden numbness in arms or legs:     Sudden onset of difficulty speaking or slurred speech:    Temporary loss of vision in one eye:     Problems with dizziness:         Gastrointestinal    Blood in stool:     Vomited blood:         Genitourinary    Burning when urinating:     Blood in urine:        Psychiatric    Major depression:         Hematologic    Bleeding problems:    Problems with blood clotting too easily:        Skin    Rashes or ulcers:         Constitutional    Fever or chills:      PHYSICAL EXAMINATION:  There were no vitals filed for this visit.  General:  WDWN in NAD; vital signs documented above Gait: Not observed HENT: WNL, normocephalic Pulmonary: normal non-labored breathing , without wheezing Cardiac: regular HR Abdomen: soft, NT, no masses Skin: without rashes Vascular Exam/Pulses:  Right Left  Radial 2+ (normal) 2+ (normal)  Ulnar 2+ (normal) 2+ (normal)  Femoral 2+ (normal) 2+ (normal)  Popliteal    DP 2+ (normal) 1+ (weak)  PT     Extremities: without ischemic changes, without Gangrene , without cellulitis; without open wounds;  Some erythema appreciated in the foot, this appears to be associated with swelling, some dependent rubor. No open wounds, edema localized to the dorsum of the left foot and first toe Musculoskeletal: no muscle wasting or atrophy  Neurologic: A&O X 3;  No focal weakness or paresthesias are detected Psychiatric:  The pt has Normal affect.   Non-Invasive Vascular Imaging:   ABI Findings:  +---------+------------------+-----+---------+--------+   Right     Rt Pressure (mmHg) Index Waveform  Comment    +---------+------------------+-----+---------+--------+   Brachial  189                                           +---------+------------------+-----+---------+--------+  PTA       232                1.21  triphasic            +---------+------------------+-----+---------+--------+   DP        243                1.27  triphasic            +---------+------------------+-----+---------+--------+   Great Toe 89                 0.46  Abnormal             +---------+------------------+-----+---------+--------+   +---------+------------------+-----+---------+-------+   Left      Lt Pressure (mmHg) Index Waveform  Comment   +---------+------------------+-----+---------+-------+   Brachial  192                                           +---------+------------------+-----+---------+-------+   PTA       199                1.04  triphasic           +---------+------------------+-----+---------+-------+   DP        202                1.05  triphasic           +---------+------------------+-----+---------+-------+   Great Toe 77                 0.40  Abnormal            +---------+------------------+-----+---------+-------+     ASSESSMENT/PLAN: Kaitlin Villarreal is a 88 y.o. female presenting with pain erythema and induration present at the left first toe radiating to the dorsum of the foot.  No open wounds are appreciated.  She has no history of gout that she is aware of.  ABI was independently reviewed demonstrating normal blood flow.  I am unsure as to the etiology of her dorsal foot swelling, and toe erythema.  There are no skin breaks.  The erythema appears to be from the swelling, and not cellulitis.  No ascending cellulitis or lymphangitis.  Only the first toe and dorsal aspect of the foot are involved. This could be gout as the majority of her pain is localized at the first MTP joint.  I will have her see my colleague Dr. Jacqualyn Posey DPM for further evaluation.  I asked her to call my office should any questions or concerns arise.   Broadus [name], MD [facility] [phone]

## 2021-10-26 ENCOUNTER — Ambulatory Visit (INDEPENDENT_AMBULATORY_CARE_PROVIDER_SITE_OTHER): Payer: Medicare Other

## 2021-10-26 ENCOUNTER — Other Ambulatory Visit: Payer: Self-pay

## 2021-10-26 DIAGNOSIS — M79672 Pain in left foot: Secondary | ICD-10-CM | POA: Diagnosis not present

## 2021-10-26 DIAGNOSIS — M79671 Pain in right foot: Secondary | ICD-10-CM | POA: Diagnosis not present

## 2021-10-27 ENCOUNTER — Ambulatory Visit (INDEPENDENT_AMBULATORY_CARE_PROVIDER_SITE_OTHER): Payer: Medicare Other | Admitting: Vascular Surgery

## 2021-10-27 ENCOUNTER — Encounter: Payer: Self-pay | Admitting: Vascular Surgery

## 2021-10-27 VITALS — BP 183/79 | HR 59 | Temp 98.1°F | Resp 20 | Ht 60.0 in | Wt 107.0 lb

## 2021-10-27 DIAGNOSIS — R2242 Localized swelling, mass and lump, left lower limb: Secondary | ICD-10-CM | POA: Diagnosis not present

## 2021-11-07 ENCOUNTER — Ambulatory Visit (INDEPENDENT_AMBULATORY_CARE_PROVIDER_SITE_OTHER): Payer: Medicare Other

## 2021-11-07 ENCOUNTER — Ambulatory Visit (INDEPENDENT_AMBULATORY_CARE_PROVIDER_SITE_OTHER): Payer: Medicare Other | Admitting: Podiatry

## 2021-11-07 ENCOUNTER — Other Ambulatory Visit: Payer: Self-pay

## 2021-11-07 DIAGNOSIS — R609 Edema, unspecified: Secondary | ICD-10-CM

## 2021-11-07 DIAGNOSIS — M778 Other enthesopathies, not elsewhere classified: Secondary | ICD-10-CM | POA: Diagnosis not present

## 2021-11-07 DIAGNOSIS — M1000 Idiopathic gout, unspecified site: Secondary | ICD-10-CM | POA: Diagnosis not present

## 2021-11-07 DIAGNOSIS — M79671 Pain in right foot: Secondary | ICD-10-CM

## 2021-11-07 DIAGNOSIS — M79672 Pain in left foot: Secondary | ICD-10-CM

## 2021-11-08 ENCOUNTER — Other Ambulatory Visit: Payer: Self-pay | Admitting: Podiatry

## 2021-11-08 LAB — CBC WITH DIFFERENTIAL/PLATELET
Absolute Monocytes: 596 cells/uL (ref 200–950)
Basophils Absolute: 21 cells/uL (ref 0–200)
Basophils Relative: 0.3 %
Eosinophils Absolute: 71 cells/uL (ref 15–500)
Eosinophils Relative: 1 %
HCT: 37.5 % (ref 35.0–45.0)
Hemoglobin: 12.2 g/dL (ref 11.7–15.5)
Lymphs Abs: 1676 cells/uL (ref 850–3900)
MCH: 30.3 pg (ref 27.0–33.0)
MCHC: 32.5 g/dL (ref 32.0–36.0)
MCV: 93.1 fL (ref 80.0–100.0)
MPV: 10.7 fL (ref 7.5–12.5)
Monocytes Relative: 8.4 %
Neutro Abs: 4736 cells/uL (ref 1500–7800)
Neutrophils Relative %: 66.7 %
Platelets: 281 10*3/uL (ref 140–400)
RBC: 4.03 10*6/uL (ref 3.80–5.10)
RDW: 13 % (ref 11.0–15.0)
Total Lymphocyte: 23.6 %
WBC: 7.1 10*3/uL (ref 3.8–10.8)

## 2021-11-08 LAB — C-REACTIVE PROTEIN: CRP: 5.2 mg/L (ref <8.0)

## 2021-11-08 LAB — BASIC METABOLIC PANEL
BUN/Creatinine Ratio: 23 (calc) — ABNORMAL HIGH (ref 6–22)
BUN: 42 mg/dL — ABNORMAL HIGH (ref 7–25)
CO2: 29 mmol/L (ref 20–32)
Calcium: 9.6 mg/dL (ref 8.6–10.4)
Chloride: 101 mmol/L (ref 98–110)
Creat: 1.81 mg/dL — ABNORMAL HIGH (ref 0.60–0.95)
Glucose, Bld: 96 mg/dL (ref 65–99)
Potassium: 4.6 mmol/L (ref 3.5–5.3)
Sodium: 142 mmol/L (ref 135–146)

## 2021-11-08 LAB — URIC ACID: Uric Acid, Serum: 11.7 mg/dL — ABNORMAL HIGH (ref 2.5–7.0)

## 2021-11-08 LAB — SEDIMENTATION RATE: Sed Rate: 41 mm/h — ABNORMAL HIGH (ref 0–30)

## 2021-11-08 MED ORDER — COLCHICINE 0.6 MG PO TABS: 0.3000 mg | ORAL_TABLET | Freq: Every day | ORAL | 0 refills | Status: DC

## 2021-11-11 NOTE — Progress Notes (Signed)
Subjective:   Patient ID: Kaitlin Villarreal, female   DOB: 88 y.o.   MRN: [id number]   HPI 88 year old female presents the office today with her husband for concerns of left foot swelling.  She states this started about 6 months ago into the toes and then the whole foot became swollen and red and had a throbbing sensation.  She did follow-up with Dr. Virl Cagey with vascular surgery. Circulation was found to be adequate and she was referred for further evaluation. She states that the swelling has much improved but still has some redness to the foot.  She denies any recent injury.   Review of Systems  All other systems reviewed and are negative.  Past Medical History:  Diagnosis Date   Acute on chronic diastolic heart failure ([place]) 03/18/2018   Aortic stenosis 10/14/2014   Formatting of this note might be different from the original.  moderate by echo 11/2015   Arthritis    Bilateral renal masses 07/26/2017   Bone spur of toe of left foot    Chronic diastolic (congestive) heart failure (HCC)    Chronic diastolic heart failure ([place]) 03/18/2018   CKD (chronic kidney disease) stage 3, GFR 30-59 ml/min (HCC) 07/26/2017   Essential hypertension    GERD (gastroesophageal reflux disease)    HLD (hyperlipidemia)    Hyperlipidemia 11/25/2016   Pacemaker 09/02/2018   Pancreatic lesion 04/17/2018   Pancreatic mass    a. 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 03/2018: s/p TAVR by Burt Knack and Dr. Roxy Manns   Stage 3 chronic kidney disease ([place])    TIA (transient ischemic attack)     a. 1992   VSD (ventricular septal defect)     Past Surgical History:  Procedure Laterality Date   ABDOMINAL HYSTERECTOMY     APPENDECTOMY     HERNIA REPAIR     INTRAOPERATIVE  TRANSTHORACIC ECHOCARDIOGRAM N/A 03/18/2018   Procedure: INTRAOPERATIVE TRANSTHORACIC ECHOCARDIOGRAM;  Surgeon: Sherren Mocha, MD;  Location: [place] [facility];  Service: Open Heart Surgery;  Laterality: N/A;   PACEMAKER IMPLANT N/A 03/20/2018   Procedure: PACEMAKER IMPLANT;  Surgeon: Constance Haw, MD;  Location: [facility];  Service: Cardiovascular;  Laterality: N/A;   RIGHT HEART CATH N/A 03/20/2018   Procedure: RIGHT HEART CATH;  Surgeon: Sherren Mocha, MD;  Location: [place] [facility];  Service: Cardiovascular;  Laterality: N/A;   RIGHT/LEFT HEART CATH AND CORONARY ANGIOGRAPHY N/A 02/06/2018   Procedure: RIGHT/LEFT HEART CATH AND CORONARY ANGIOGRAPHY;  Surgeon: Sherren Mocha, MD;  Location: [place] [facility];  Service: Cardiovascular;  Laterality: N/A;   TONSILLECTOMY     TRANSCATHETER AORTIC VALVE REPLACEMENT, TRANSFEMORAL N/A 03/18/2018   Procedure: TRANSCATHETER AORTIC VALVE REPLACEMENT, TRANSFEMORAL;  Surgeon: Sherren Mocha, MD;  Location: [facility];  Service: Open Heart Surgery;  Laterality: N/A;   WISDOM TOOTH EXTRACTION       Current Outpatient Medications:    colchicine 0.6 MG tablet, Take 0.5 tablets (0.3 mg total) by mouth daily., Disp: 5 tablet, Rfl: 0   acetaminophen (TYLENOL) 650 MG CR tablet, Take 650-1,300 mg by mouth every 8 (eight) hours as needed for pain., Disp: , Rfl:    amoxicillin (AMOXIL) 500 MG tablet, Take 4 tablets (  2,000 mg total) by mouth as directed. 1 hour prior to dental work including cleanings, Disp: 12 tablet, Rfl: 12   aspirin EC 81 MG tablet, Take 81 mg by mouth daily. , Disp: , Rfl:    Carboxymethylcellulose Sod PF (THERATEARS PF) 0.25 % SOLN, Place 1 drop into both eyes in the morning and at bedtime., Disp: , Rfl:    Cyanocobalamin (B-12) 2500 MCG TABS, Take 2,500 mcg by mouth daily. , Disp: , Rfl:    eplerenone (INSPRA) 25 MG tablet, Take 1 tablet by mouth once daily, Disp: 90 tablet, Rfl: 3   estradiol (ESTRACE) 1 MG tablet, Take 0.5 mg by mouth  daily., Disp: , Rfl:    famotidine (PEPCID) 20 MG tablet, Take 20 mg by mouth as needed for heartburn or indigestion., Disp: , Rfl:    fluticasone (FLONASE) 50 MCG/ACT nasal spray, Place 2 sprays into both nostrils daily as needed for allergies or rhinitis. , Disp: , Rfl:    furosemide (LASIX) 20 MG tablet, Take 20 mg by mouth daily., Disp: , Rfl:    hydrALAZINE (APRESOLINE) 25 MG tablet, Take 25 mg by mouth 3 (three) times daily as needed. When BP over 180 (Patient not taking: Reported on 10/27/2021), Disp: , Rfl:    lisinopril (ZESTRIL) 20 MG tablet, Take 1 tablet by mouth once daily, Disp: 90 tablet, Rfl: 2   metoprolol tartrate (LOPRESSOR) 50 MG tablet, Take 50 mg by mouth daily., Disp: , Rfl:    Multiple Vitamins-Minerals (MULTI-VITAMIN GUMMIES) CHEW, Chew 2 tablets by mouth daily. , Disp: , Rfl:    Omega-3 Fatty Acids (FISH OIL) 1000 MG CAPS, Take 1,000 mg by mouth daily., Disp: , Rfl:    pravastatin (PRAVACHOL) 20 MG tablet, Take 20 mg by mouth at bedtime.  (Patient not taking: Reported on 10/27/2021), Disp: , Rfl:    Probiotic Product ([employer] PROBIOTIC) CHEW, Chew 2 each by mouth daily after supper. , Disp: , Rfl:    triamcinolone cream (KENALOG) 0.5 %, Apply 3 application topically 3 (three) times daily as needed (for itching- AFFECTED AREAS). , Disp: , Rfl:    Vitamin D, Ergocalciferol, (DRISDOL) 1.25 MG (50000 UT) CAPS capsule, Take 50,000 Units by mouth See admin instructions. Take 50,000 units by mouth 2 times a week- Mondays and Fridays, Disp: , Rfl: 1  Allergies  Allergen Reactions   Clarithromycin Other (See Comments)    Felt like body was swollen, felt awful"- and, angioedema   Latex Rash and Other (See Comments)    Causes sores and Rash-Generalized   Levofloxacin Other (See Comments)    Hallucinations    Azithromycin Other (See Comments)    Reaction??   Ivp Dye [Iodinated Contrast Media]           Objective:  Physical Exam  General: AAO x3, NAD  Dermatological: There  is mild erythema present on the left foot with slight increased temperature on the midfoot.  There are no open lesions noted.  Vascular: DP pulses on the right 2/4, left 1/4.  Decreased PT pulses.  CRT less than 3 seconds. There is no pain with calf compression, swelling, warmth, erythema.   Neruologic: Grossly intact via light touch bilateral.  Sensation appears to be intact with Thornell Mule monofilament  Musculoskeletal: Mild tenderness on the first MPJ into the toes as well.  There is no specific area of pinpoint tenderness noted today.  Muscular strength 5/5 in all groups tested bilateral.  Gait: Unassisted, Nonantalgic.  Assessment:   Left foot swelling, erythema, possible gout     Plan:  -Treatment options discussed including all alternatives, risks, and complications -Etiology of symptoms were discussed -X-rays were obtained and reviewed with the patient.  There is no evidence of acute fracture or stress fracture noted today. -Blood work ordered today including uric acid, CRP, CBC, sed rate, BMP. -Discussed taking Tylenol for pain as well as topical Voltaren if needed. -I will call her once we have the results of the blood work. -She is concerned about the mild skin peeling toenails.  As a courtesy debrided the nails with any complications or bleeding but they were not significantly elongated today.  *Of note the blood work came back with increased uric acid at 11.7.  Her symptoms are also consistent with gout.  I have started her on colchicine 0.3 mg daily.  Trula Slade DPM

## 2021-11-22 ENCOUNTER — Other Ambulatory Visit: Payer: Self-pay | Admitting: Podiatry

## 2021-11-22 DIAGNOSIS — M778 Other enthesopathies, not elsewhere classified: Secondary | ICD-10-CM

## 2021-11-24 ENCOUNTER — Ambulatory Visit (INDEPENDENT_AMBULATORY_CARE_PROVIDER_SITE_OTHER): Payer: Medicare Other | Admitting: Podiatry

## 2021-11-24 ENCOUNTER — Other Ambulatory Visit: Payer: Self-pay

## 2021-11-24 DIAGNOSIS — M1000 Idiopathic gout, unspecified site: Secondary | ICD-10-CM | POA: Diagnosis not present

## 2021-11-24 DIAGNOSIS — R609 Edema, unspecified: Secondary | ICD-10-CM

## 2021-11-24 NOTE — Patient Instructions (Signed)
Gout ?Gout is a condition that causes painful swelling of the joints. Gout is a type of inflammation of the joints (arthritis). This condition is caused by having too much uric acid in the body. Uric acid is a chemical that forms when the body breaks down substances called purines. Purines are important for building body proteins. ?When the body has too much uric acid, sharp crystals can form and build up inside the joints. This causes pain and swelling. Gout attacks can happen quickly and may be very painful (acute gout). Over time, the attacks can affect more joints and become more frequent (chronic gout). Gout can also cause uric acid to build up under the skin and inside the kidneys. ?What are the causes? ?This condition is caused by too much uric acid in your blood. This can happen because: ?Your kidneys do not remove enough uric acid from your blood. This is the most common cause. ?Your body makes too much uric acid. This can happen with some cancers and cancer treatments. It can also occur if your body is breaking down too many red blood cells (hemolytic anemia). ?You eat too many foods that are high in purines. These foods include organ meats and some seafood. Alcohol, especially beer, is also high in purines. ?A gout attack may be triggered by trauma or stress. ?What increases the risk? ?You are more likely to develop this condition if you: ?Have a family history of gout. ?Are female and middle-aged. ?Are female and have gone through menopause. ?Are obese. ?Frequently drink alcohol, especially beer. ?Are dehydrated. ?Lose weight too quickly. ?Have an organ transplant. ?Have lead poisoning. ?Take certain medicines, including aspirin, cyclosporine, diuretics, levodopa, and niacin. ?Have kidney disease. ?Have a skin condition called psoriasis. ?What are the signs or symptoms? ?An attack of acute gout happens quickly. It usually occurs in just one joint. The most common place is the big toe. Attacks often start  at night. Other joints that may be affected include joints of the feet, ankle, knee, fingers, wrist, or elbow. Symptoms of this condition may include: ?Severe pain. ?Warmth. ?Swelling. ?Stiffness. ?Tenderness. The affected joint may be very painful to touch. ?Shiny, red, or purple skin. ?Chills and fever. ?Chronic gout may cause symptoms more frequently. More joints may be involved. You may also have white or yellow lumps (tophi) on your hands or feet or in other areas near your joints. ?How is this diagnosed? ?This condition is diagnosed based on your symptoms, medical history, and physical exam. You may have tests, such as: ?Blood tests to measure uric acid levels. ?Removal of joint fluid with a thin needle (aspiration) to look for uric acid crystals. ?X-rays to look for joint damage. ?How is this treated? ?Treatment for this condition has two phases: treating an acute attack and preventing future attacks. Acute gout treatment may include medicines to reduce pain and swelling, including: ?NSAIDs. ?Steroids. These are strong anti-inflammatory medicines that can be taken by mouth (orally) or injected into a joint. ?Colchicine. This medicine relieves pain and swelling when it is taken soon after an attack. It can be given by mouth or through an IV. ?Preventive treatment may include: ?Daily use of smaller doses of NSAIDs or colchicine. ?Use of a medicine that reduces uric acid levels in your blood. ?Changes to your diet. You may need to see a dietitian about what to eat and drink to prevent gout. ?Follow these instructions at home: ?During a gout attack ? ?If directed, put ice on the affected area: ?  Put ice in a plastic bag. ?Place a towel between your skin and the bag. ?Leave the ice on for 20 minutes, 2-3 times a day. ?Raise (elevate) the affected joint above the level of your heart as often as possible. ?Rest the joint as much as possible. If the affected joint is in your leg, you may be given crutches to  use. ?Follow instructions from your health care provider about eating or drinking restrictions. ?Avoiding future gout attacks ?Follow a low-purine diet as told by your dietitian or health care provider. Avoid foods and drinks that are high in purines, including liver, kidney, anchovies, asparagus, herring, mushrooms, mussels, and beer. ?Maintain a healthy weight or lose weight if you are overweight. If you want to lose weight, talk with your health care provider. It is important that you do not lose weight too quickly. ?Start or maintain an exercise program as told by your health care provider. ?Eating and drinking ?Drink enough fluids to keep your urine pale yellow. ?If you drink alcohol: ?Limit how much you use to: ?0-1 drink a day for women. ?0-2 drinks a day for men. ?Be aware of how much alcohol is in your drink. In the [place] one drink equals one 12 oz bottle of beer (355 mL) one 5 oz glass of wine (148 mL), or one 1? oz glass of hard liquor (44 mL). ?General instructions ?Take over-the-counter and prescription medicines only as told by your health care provider. ?Do not drive or use heavy machinery while taking prescription pain medicine. ?Return to your normal activities as told by your health care provider. Ask your health care provider what activities are safe for you. ?Keep all follow-up visits as told by your health care provider. This is important. ?Contact a health care provider if you have: ?Another gout attack. ?Continuing symptoms of a gout attack after 10 days of treatment. ?Side effects from your medicines. ?Chills or a fever. ?Burning pain when you urinate. ?Pain in your lower back or belly. ?Get help right away if you: ?Have severe or uncontrolled pain. ?Cannot urinate. ?Summary ?Gout is painful swelling of the joints caused by inflammation. ?The most common site of pain is the big toe, but it can affect other joints in the body. ?Medicines and dietary changes can help to prevent and treat gout  attacks. ?This information is not intended to replace advice given to you by your health care provider. Make sure you discuss any questions you have with your health care provider. ?Document Revised: 03/28/2018 Document Reviewed: 04/09/2018 ?Elsevier Patient Education ? 2022 [employer] ? ?

## 2021-11-27 ENCOUNTER — Telehealth: Payer: Self-pay | Admitting: *Deleted

## 2021-11-27 ENCOUNTER — Other Ambulatory Visit: Payer: Self-pay | Admitting: Podiatry

## 2021-11-27 DIAGNOSIS — M1000 Idiopathic gout, unspecified site: Secondary | ICD-10-CM

## 2021-11-27 NOTE — Telephone Encounter (Signed)
Faxed to [facility] Physician, confirmed 11/27/21

## 2021-11-27 NOTE — Telephone Encounter (Incomplete Revision)
Patient is calling because she was supposed to have blood work at [facility] did not receive any paperwork to have labs drawn. Please advise. Faxed lab orders to [facility] physicians, received confirmation 11/27/21.

## 2021-11-27 NOTE — Telephone Encounter (Addendum)
Patient is calling because she was supposed to have blood work at [facility] did not receive any paperwork to have labs drawn. Please advise. Faxed lab orders to [facility] physicians, received confirmation 11/27/21.

## 2021-11-28 DIAGNOSIS — M1000 Idiopathic gout, unspecified site: Secondary | ICD-10-CM | POA: Diagnosis not present

## 2021-12-01 ENCOUNTER — Other Ambulatory Visit: Payer: Self-pay | Admitting: Podiatry

## 2021-12-01 DIAGNOSIS — I10 Essential (primary) hypertension: Secondary | ICD-10-CM | POA: Diagnosis not present

## 2021-12-01 DIAGNOSIS — I509 Heart failure, unspecified: Secondary | ICD-10-CM | POA: Diagnosis not present

## 2021-12-01 DIAGNOSIS — M1000 Idiopathic gout, unspecified site: Secondary | ICD-10-CM

## 2021-12-01 DIAGNOSIS — Z6821 Body mass index (BMI) 21.0-21.9, adult: Secondary | ICD-10-CM | POA: Diagnosis not present

## 2021-12-01 NOTE — Progress Notes (Signed)
Uric acid level is still high at 9.9- referral to rheumatology placed.  ?

## 2021-12-01 NOTE — Progress Notes (Signed)
Subjective: 88 year old female presents the office with her husband for follow-up evaluation of left foot swelling, pain.  She said that she is doing a lot better compared to last appointment.  She still does get some discomfort.  The swelling has decreased.  She does note some dryness to her skin but denies any open sores.  Denies any fevers or chills.  No other concerns.  Objective: AAO x3, NAD DP/PT pulses palpable bilaterally, CRT less than 3 seconds The edema is much improved.  There is slight discoloration still evident to the foot but there is no increased temperature gradient.  There is no open lesions.  No area of pinpoint tenderness.  No pain with calf compression, swelling, warmth, erythema  Assessment: Gout  Plan: -All treatment options discussed with the patient including all alternatives, risks, complications.  -Overall she is doing much better.  She is asking about refilling the colchicine about to get updated blood work to check uric acid level.  This was ordered for her.  We discussed diet to help with gout flares as well. -Continue supportive shoe gear. -Patient encouraged to call the office with any questions, concerns, change in symptoms.   Trula Slade DPM

## 2021-12-15 ENCOUNTER — Telehealth: Payer: Self-pay | Admitting: *Deleted

## 2021-12-15 NOTE — Telephone Encounter (Signed)
-----   Message from Trula Slade, DPM sent at 12/01/2021  1:39 PM EST ----- ?I received her blood work from here PCP and uric acid is still high at 9.9. I have put in a referral with rheumatology to help manage this.  ? ?Can you please fax the referral to them? It is [facility]. Thanks! ? ?

## 2021-12-15 NOTE — Telephone Encounter (Signed)
Faxed referral for R[facility] to  [facility]-12/15/21,confirmation received ?

## 2021-12-20 ENCOUNTER — Ambulatory Visit (INDEPENDENT_AMBULATORY_CARE_PROVIDER_SITE_OTHER): Payer: Medicare Other

## 2021-12-20 DIAGNOSIS — I442 Atrioventricular block, complete: Secondary | ICD-10-CM

## 2021-12-20 LAB — CUP PACEART REMOTE DEVICE CHECK
Battery Remaining Longevity: 91 mo
Battery Remaining Percentage: 72 %
Battery Voltage: 3.01 V
Brady Statistic AP VP Percent: 1 %
Brady Statistic AP VS Percent: 16 %
Brady Statistic AS VP Percent: 1 %
Brady Statistic AS VS Percent: 83 %
Brady Statistic RA Percent Paced: 16 %
Brady Statistic RV Percent Paced: 1 %
Date Time Interrogation Session: 20230322020014
Implantable Lead Implant Date: 20190620
Implantable Lead Implant Date: 20190620
Implantable Lead Location: 753859
Implantable Lead Location: 753860
Implantable Pulse Generator Implant Date: 20190620
Lead Channel Impedance Value: 390 Ohm
Lead Channel Impedance Value: 510 Ohm
Lead Channel Pacing Threshold Amplitude: 0.625 V
Lead Channel Pacing Threshold Amplitude: 0.75 V
Lead Channel Pacing Threshold Pulse Width: 0.5 ms
Lead Channel Pacing Threshold Pulse Width: 0.5 ms
Lead Channel Sensing Intrinsic Amplitude: 11 mV
Lead Channel Sensing Intrinsic Amplitude: 4.6 mV
Lead Channel Setting Pacing Amplitude: 0.875
Lead Channel Setting Pacing Amplitude: 2 V
Lead Channel Setting Pacing Pulse Width: 0.5 ms
Lead Channel Setting Sensing Sensitivity: 2 mV
Pulse Gen Model: 2272
Pulse Gen Serial Number: 9035930

## 2021-12-21 DIAGNOSIS — M2550 Pain in unspecified joint: Secondary | ICD-10-CM | POA: Diagnosis not present

## 2021-12-21 DIAGNOSIS — M1990 Unspecified osteoarthritis, unspecified site: Secondary | ICD-10-CM | POA: Diagnosis not present

## 2021-12-21 DIAGNOSIS — M109 Gout, unspecified: Secondary | ICD-10-CM | POA: Diagnosis not present

## 2021-12-21 DIAGNOSIS — Z79899 Other long term (current) drug therapy: Secondary | ICD-10-CM | POA: Diagnosis not present

## 2021-12-21 DIAGNOSIS — M79642 Pain in left hand: Secondary | ICD-10-CM | POA: Diagnosis not present

## 2021-12-21 DIAGNOSIS — M79673 Pain in unspecified foot: Secondary | ICD-10-CM | POA: Diagnosis not present

## 2021-12-21 DIAGNOSIS — M79672 Pain in left foot: Secondary | ICD-10-CM | POA: Diagnosis not present

## 2021-12-21 DIAGNOSIS — M7989 Other specified soft tissue disorders: Secondary | ICD-10-CM | POA: Diagnosis not present

## 2021-12-21 DIAGNOSIS — N1831 Chronic kidney disease, stage 3a: Secondary | ICD-10-CM | POA: Diagnosis not present

## 2021-12-21 DIAGNOSIS — M79641 Pain in right hand: Secondary | ICD-10-CM | POA: Diagnosis not present

## 2021-12-21 DIAGNOSIS — M79671 Pain in right foot: Secondary | ICD-10-CM | POA: Diagnosis not present

## 2021-12-24 ENCOUNTER — Other Ambulatory Visit: Payer: Self-pay | Admitting: Cardiology

## 2021-12-29 NOTE — Telephone Encounter (Signed)
-----   Message from Trula Slade, DPM sent at 12/01/2021  1:39 PM EST ----- ?I received her blood work from here PCP and uric acid is still high at 9.9. I have put in a referral with rheumatology to help manage this.  ? ?Can you please fax the referral to them? It is [facility]. Thanks! ? ?

## 2022-01-02 NOTE — Progress Notes (Signed)
Remote pacemaker transmission.   

## 2022-01-08 ENCOUNTER — Ambulatory Visit (INDEPENDENT_AMBULATORY_CARE_PROVIDER_SITE_OTHER): Payer: Medicare Other | Admitting: Cardiology

## 2022-01-08 ENCOUNTER — Encounter: Payer: Self-pay | Admitting: Cardiology

## 2022-01-08 VITALS — BP 208/60 | HR 69 | Ht 60.0 in | Wt 106.2 lb

## 2022-01-08 DIAGNOSIS — I5032 Chronic diastolic (congestive) heart failure: Secondary | ICD-10-CM | POA: Diagnosis not present

## 2022-01-08 DIAGNOSIS — Z950 Presence of cardiac pacemaker: Secondary | ICD-10-CM

## 2022-01-08 DIAGNOSIS — I442 Atrioventricular block, complete: Secondary | ICD-10-CM

## 2022-01-08 NOTE — Patient Instructions (Addendum)
Medication Instructions:  ?Your physician recommends that you continue on your current medications as directed. Please refer to the Current Medication list given to you today. ? ?*If you need a refill on your cardiac medications before your next appointment, please call your pharmacy* ? ? ?Lab Work: ?None ordered. ? ?If you have labs (blood work) drawn today and your tests are completely normal, you will receive your results only by: ?MyChart Message (if you have MyChart) OR ?A paper copy in the mail ?If you have any lab test that is abnormal or we need to change your treatment, we will call you to review the results. ? ? ?Testing/Procedures: ?None ordered. ? ? ?Follow-Up: ?At [facility], you and your health needs are our priority.  As part of our continuing mission to provide you with exceptional heart care, we have created designated Provider Care Teams.  These Care Teams include your primary Cardiologist (physician) and Advanced Practice Providers (APPs -  Physician Assistants and Nurse Practitioners) who all work together to provide you with the care you need, when you need it. ? ?We recommend signing up for the patient portal called "MyChart".  Sign up information is provided on this After Visit Summary.  MyChart is used to connect with patients for Virtual Visits (Telemedicine).  Patients are able to view lab/test results, encounter notes, upcoming appointments, etc.  Non-urgent messages can be sent to your provider as well.   ?To learn more about what you can do with MyChart, go to [email or web address].   ? ?Your next appointment:   ?12 month(s) ? ?The format for your next appointment:   ?In Person ? ?Provider:   ?Allegra Lai, MD{ ? ? ?Important Information About Sugar ? ? ? ? ?  ?

## 2022-01-08 NOTE — Progress Notes (Signed)
? ?Electrophysiology Office Note ? ? ?Date:  01/08/2022  ? ?ID:  Kaitlin Villarreal, DOB [birth year], MRN [id number] ? ?PCP:  Angelina Sheriff, MD  ?Cardiologist:  Bettina Gavia ?Primary Electrophysiologist:  Abeera Flannery Meredith Leeds, MD   ? ?No chief complaint on file. ? ?  ?History of Present Illness: ?Kaitlin Villarreal is a 88 y.o. female who is being seen today for the evaluation of complete heart block at the request of Kaitlin Villarreal. Presenting today for electrophysiology evaluation.   ? ?History of severe aortic stenosis status post TAVR, VSD, complete heart block status post [employer] Jude dual-chamber pacemaker implanted 1/70/0174, chronic diastolic heart failure, hypertension. ? ?Today, denies symptoms of palpitations, chest pain, shortness of breath, orthopnea, PND, lower extremity edema, claudication, dizziness, presyncope, syncope, bleeding, or neurologic sequela. The patient is tolerating medications without difficulties.  He currently feels well.  Her blood pressure is significantly elevated today.  On recheck, his come down by 20 points systolic.  She states that her blood pressure is high at times and she has hydralazine at home to take if it is greater than 944 systolic.  She also states that her blood pressure is usually much better controlled than this. ? ? ?Past Medical History:  ?Diagnosis Date  ? Acute on chronic diastolic heart failure (Kaitlin Villarreal) 03/18/2018  ? Aortic stenosis 10/14/2014  ? Formatting of this note might be different from the original.  moderate by echo 11/2015  ? Arthritis   ? Bilateral renal masses 07/26/2017  ? Bone spur of toe of left foot   ? Chronic diastolic (congestive) heart failure (HCC)   ? Chronic diastolic heart failure ([place]) 03/18/2018  ? CKD (chronic kidney disease) stage 3, GFR 30-59 ml/min (HCC) 07/26/2017  ? Essential hypertension   ? GERD (gastroesophageal reflux disease)   ? HLD (hyperlipidemia)   ? Hyperlipidemia 11/25/2016  ? Pacemaker 09/02/2018  ? Pancreatic lesion 04/17/2018  ? Pancreatic mass    ? a. benign appearing but needs f/u, noted on pre TAVR CTs  ? Plantar fat pad atrophy of left foot   ? S/P TAVR (transcatheter aortic valve replacement) 03/18/2018  ? 23 mm Edwards Sapien 3 transcatheter heart valve placed via percutaneous right transfemoral approach   ? Severe aortic stenosis   ? a. 03/2018: s/p TAVR by Kaitlin Villarreal and Kaitlin Villarreal  ? Stage 3 chronic kidney disease (Kaitlin Villarreal)   ? TIA (transient ischemic attack)   ?  a. 1992  ? VSD (ventricular septal defect)   ? ?Past Surgical History:  ?Procedure Laterality Date  ? ABDOMINAL HYSTERECTOMY    ? APPENDECTOMY    ? HERNIA REPAIR    ? INTRAOPERATIVE TRANSTHORACIC ECHOCARDIOGRAM N/A 03/18/2018  ? Procedure: INTRAOPERATIVE TRANSTHORACIC ECHOCARDIOGRAM;  Surgeon: Kaitlin Mocha, MD;  Location: [facility];  Service: Open Heart Surgery;  Laterality: N/A;  ? PACEMAKER IMPLANT N/A 03/20/2018  ? Procedure: PACEMAKER IMPLANT;  Surgeon: Kaitlin Haw, MD;  Location: [facility];  Service: Cardiovascular;  Laterality: N/A;  ? RIGHT HEART CATH N/A 03/20/2018  ? Procedure: RIGHT HEART CATH;  Surgeon: Kaitlin Mocha, MD;  Location: [facility];  Service: Cardiovascular;  Laterality: N/A;  ? RIGHT/LEFT HEART CATH AND CORONARY ANGIOGRAPHY N/A 02/06/2018  ? Procedure: RIGHT/LEFT HEART CATH AND CORONARY ANGIOGRAPHY;  Surgeon: Kaitlin Mocha, MD;  Location: [facility];  Service: Cardiovascular;  Laterality: N/A;  ? TONSILLECTOMY    ? TRANSCATHETER AORTIC VALVE REPLACEMENT, TRANSFEMORAL N/A 03/18/2018  ? Procedure: TRANSCATHETER AORTIC VALVE REPLACEMENT,  TRANSFEMORAL;  Surgeon: Kaitlin Mocha, MD;  Location: [facility];  Service: Open Heart Surgery;  Laterality: N/A;  ? WISDOM TOOTH EXTRACTION    ? ? ? ?Current Outpatient Medications  ?Medication Sig Dispense Refill  ? acetaminophen (TYLENOL) 650 MG CR tablet Take 650-1,300 mg by mouth every 8 (eight) hours as needed for pain.    ? allopurinol (ZYLOPRIM) 100 MG tablet Take 100 mg by mouth daily.    ? amLODipine  (NORVASC) 2.5 MG tablet Take 2.5 mg by mouth daily.    ? amoxicillin (AMOXIL) 500 MG tablet Take 4 tablets (2,000 mg total) by mouth as directed. 1 hour prior to dental work including cleanings 12 tablet 12  ? aspirin EC 81 MG tablet Take 81 mg by mouth daily.     ? Carboxymethylcellulose Sod PF (THERATEARS PF) 0.25 % SOLN Place 1 drop into both eyes in the morning and at bedtime.    ? colchicine 0.6 MG tablet Take 0.5 tablets (0.3 mg total) by mouth daily. 5 tablet 0  ? Cyanocobalamin (B-12) 2500 MCG TABS Take 2,500 mcg by mouth daily.     ? eplerenone (INSPRA) 25 MG tablet Take 1 tablet by mouth once daily 90 tablet 3  ? estradiol (ESTRACE) 1 MG tablet Take 0.5 mg by mouth daily.    ? famotidine (PEPCID) 20 MG tablet Take 20 mg by mouth as needed for heartburn or indigestion.    ? fluticasone (FLONASE) 50 MCG/ACT nasal spray Place 2 sprays into both nostrils daily as needed for allergies or rhinitis.     ? furosemide (LASIX) 20 MG tablet Take 20 mg by mouth daily.    ? hydrALAZINE (APRESOLINE) 25 MG tablet Take 25 mg by mouth 3 (three) times daily as needed. When BP over 180    ? lisinopril (ZESTRIL) 20 MG tablet Take 1 tablet by mouth once daily 90 tablet 1  ? metoprolol tartrate (LOPRESSOR) 50 MG tablet Take 1 tablet by mouth twice daily 180 tablet 1  ? Multiple Vitamins-Minerals (MULTI-VITAMIN GUMMIES) CHEW Chew 2 tablets by mouth daily.     ? Omega-3 Fatty Acids (FISH OIL) 1000 MG CAPS Take 1,000 mg by mouth daily.    ? pravastatin (PRAVACHOL) 20 MG tablet Take 20 mg by mouth at bedtime.    ? Probiotic Product ([employer] PROBIOTIC) CHEW Chew 2 each by mouth daily after supper.     ? triamcinolone cream (KENALOG) 0.5 % Apply 3 application topically 3 (three) times daily as needed (for itching- AFFECTED AREAS).     ? Vitamin D, Ergocalciferol, (DRISDOL) 1.25 MG (50000 UT) CAPS capsule Take 50,000 Units by mouth See admin instructions. Take 50,000 units by mouth 2 times a week- Mondays and Fridays  1  ? ?No current  facility-administered medications for this visit.  ? ? ?Allergies:   Clarithromycin, Latex, Levofloxacin, Azithromycin, and Ivp dye [iodinated contrast media]  ? ?Social History:  The patient  reports that she has never smoked. She has never been exposed to tobacco smoke. She has never used smokeless tobacco. She reports that she does not drink alcohol and does not use drugs.  ? ?Family History:  The patient's family history includes Congenital heart disease in her brother; Heart attack in her brother; Heart disease in her mother; Uterine cancer in her sister.  ? ?ROS:  Please see the history of present illness.   Otherwise, review of systems is positive for none.   All other systems are reviewed and negative.  ? ?PHYSICAL  EXAM: ?VS:  BP (!) 208/60   Pulse 69   Ht 5' (1.524 m)   Wt 106 lb 3.2 oz (48.2 kg)   SpO2 99%   BMI 20.74 kg/m?  , BMI Body mass index is 20.74 kg/m?. ?GEN: Well nourished, well developed, in no acute distress  ?HEENT: normal  ?Neck: no JVD, carotid bruits, or masses ?Cardiac: RRR; no murmurs, rubs, or gallops,no edema  ?Respiratory:  clear to auscultation bilaterally, normal work of breathing ?GI: soft, nontender, nondistended, + BS ?MS: no deformity or atrophy  ?Skin: warm and dry, device site well healed ?Neuro:  Strength and sensation are intact ?Psych: euthymic mood, full affect ? ?EKG:  EKG is ordered today. ?Personal review of the ekg ordered shows sinus rhythm ? ?Personal review of the device interrogation today. Results in [place]  ? ?Recent Labs: ?04/10/2021: ALT 13 ?11/07/2021: BUN 42; Creat 1.81; Hemoglobin 12.2; Platelets 281; Potassium 4.6; Sodium 142  ? ? ?Lipid Panel  ?   ?Component Value Date/Time  ? CHOL 217 (H) 04/10/2021 1357  ? TRIG 180 (H) 04/10/2021 1357  ? HDL 78 04/10/2021 1357  ? CHOLHDL 2.8 04/10/2021 1357  ? LDLCALC 108 (H) 04/10/2021 1357  ? ? ? ?Wt Readings from Last 3 Encounters:  ?01/08/22 106 lb 3.2 oz (48.2 kg)  ?10/27/21 107 lb (48.5 kg)  ?10/18/21 109 lb 3.2  oz (49.5 kg)  ?  ? ? ?Other studies Reviewed: ?Additional studies/ records that were reviewed today include: TTE 05/05/18  ?Review of the above records today demonstrates:  ?- Left ventricle: The cavity size was norma

## 2022-01-30 DIAGNOSIS — L578 Other skin changes due to chronic exposure to nonionizing radiation: Secondary | ICD-10-CM | POA: Diagnosis not present

## 2022-01-30 DIAGNOSIS — L719 Rosacea, unspecified: Secondary | ICD-10-CM | POA: Diagnosis not present

## 2022-01-30 DIAGNOSIS — L219 Seborrheic dermatitis, unspecified: Secondary | ICD-10-CM | POA: Diagnosis not present

## 2022-02-01 DIAGNOSIS — M1990 Unspecified osteoarthritis, unspecified site: Secondary | ICD-10-CM | POA: Diagnosis not present

## 2022-02-01 DIAGNOSIS — N1831 Chronic kidney disease, stage 3a: Secondary | ICD-10-CM | POA: Diagnosis not present

## 2022-02-01 DIAGNOSIS — L98499 Non-pressure chronic ulcer of skin of other sites with unspecified severity: Secondary | ICD-10-CM | POA: Diagnosis not present

## 2022-02-01 DIAGNOSIS — M79673 Pain in unspecified foot: Secondary | ICD-10-CM | POA: Diagnosis not present

## 2022-02-01 DIAGNOSIS — M109 Gout, unspecified: Secondary | ICD-10-CM | POA: Diagnosis not present

## 2022-02-01 DIAGNOSIS — Z79899 Other long term (current) drug therapy: Secondary | ICD-10-CM | POA: Diagnosis not present

## 2022-02-01 DIAGNOSIS — M2550 Pain in unspecified joint: Secondary | ICD-10-CM | POA: Diagnosis not present

## 2022-03-21 ENCOUNTER — Ambulatory Visit (INDEPENDENT_AMBULATORY_CARE_PROVIDER_SITE_OTHER): Payer: Medicare Other

## 2022-03-21 DIAGNOSIS — I442 Atrioventricular block, complete: Secondary | ICD-10-CM | POA: Diagnosis not present

## 2022-03-22 LAB — CUP PACEART REMOTE DEVICE CHECK
Battery Remaining Longevity: 89 mo
Battery Remaining Percentage: 69 %
Battery Voltage: 3.01 V
Brady Statistic AP VP Percent: 1 %
Brady Statistic AP VS Percent: 14 %
Brady Statistic AS VP Percent: 1 %
Brady Statistic AS VS Percent: 86 %
Brady Statistic RA Percent Paced: 14 %
Brady Statistic RV Percent Paced: 1 %
Date Time Interrogation Session: 20230621020015
Implantable Lead Implant Date: 20190620
Implantable Lead Implant Date: 20190620
Implantable Lead Location: 753859
Implantable Lead Location: 753860
Implantable Pulse Generator Implant Date: 20190620
Lead Channel Impedance Value: 390 Ohm
Lead Channel Impedance Value: 510 Ohm
Lead Channel Pacing Threshold Amplitude: 0.625 V
Lead Channel Pacing Threshold Amplitude: 0.75 V
Lead Channel Pacing Threshold Pulse Width: 0.5 ms
Lead Channel Pacing Threshold Pulse Width: 0.5 ms
Lead Channel Sensing Intrinsic Amplitude: 10.8 mV
Lead Channel Sensing Intrinsic Amplitude: 4.5 mV
Lead Channel Setting Pacing Amplitude: 0.875
Lead Channel Setting Pacing Amplitude: 2 V
Lead Channel Setting Pacing Pulse Width: 0.5 ms
Lead Channel Setting Sensing Sensitivity: 2 mV
Pulse Gen Model: 2272
Pulse Gen Serial Number: 9035930

## 2022-04-02 NOTE — Progress Notes (Signed)
Remote pacemaker transmission.   

## 2022-04-11 DIAGNOSIS — H04123 Dry eye syndrome of bilateral lacrimal glands: Secondary | ICD-10-CM | POA: Diagnosis not present

## 2022-04-12 ENCOUNTER — Other Ambulatory Visit: Payer: Self-pay | Admitting: Cardiology

## 2022-04-22 NOTE — Progress Notes (Deleted)
Cardiology Office Note:    Date:  04/22/2022   ID:  Kaitlin Villarreal, DOB 27-Apr-1926, MRN [id number]  PCP:  Angelina Sheriff, MD  Cardiologist:  Shirlee More, MD    Referring MD: Angelina Sheriff, MD    ASSESSMENT:    No diagnosis found. PLAN:    In order of problems listed above:  ***   Next appointment: ***   Medication Adjustments/Labs and Tests Ordered: Current medicines are reviewed at length with the patient today.  Concerns regarding medicines are outlined above.  No orders of the defined types were placed in this encounter.  No orders of the defined types were placed in this encounter.   No chief complaint on file.   History of Present Illness:    Kaitlin Villarreal is a 88 y.o. female with a hx of severe symptomatic aortic stenosis with TAVR June 2019 prior and pacemaker following TAVR for heart block as well as hypertensive heart disease and chronic kidney disease last seen 10/18/2021 with foot pain and referred to vascular surgery. Compliance with diet, lifestyle and medications: ***   Past Medical History:  Diagnosis Date   Acute on chronic diastolic heart failure ([place]) 03/18/2018   Aortic stenosis 10/14/2014   Formatting of this note might be different from the original.  moderate by echo 11/2015   Arthritis    Bilateral renal masses 07/26/2017   Bone spur of toe of left foot    Chronic diastolic (congestive) heart failure (HCC)    Chronic diastolic heart failure ([place]) 03/18/2018   CKD (chronic kidney disease) stage 3, GFR 30-59 ml/min (HCC) 07/26/2017   Essential hypertension    GERD (gastroesophageal reflux disease)    HLD (hyperlipidemia)    Hyperlipidemia 11/25/2016   Pacemaker 09/02/2018   Pancreatic lesion 04/17/2018   Pancreatic mass    a. benign appearing but needs f/u, noted on pre TAVR CTs   Plantar fat pad atrophy of left foot    S/P TAVR (transcatheter aortic valve replacement) 03/18/2018   23 mm Edwards Sapien 3 transcatheter heart valve placed  via percutaneous right transfemoral approach    Severe aortic stenosis    a. 03/2018: s/p TAVR by Burt Knack and Dr. Roxy Manns   Stage 3 chronic kidney disease ([place])    TIA (transient ischemic attack)     a. 1992   VSD (ventricular septal defect)     Past Surgical History:  Procedure Laterality Date   ABDOMINAL HYSTERECTOMY     APPENDECTOMY     HERNIA REPAIR     INTRAOPERATIVE TRANSTHORACIC ECHOCARDIOGRAM N/A 03/18/2018   Procedure: INTRAOPERATIVE TRANSTHORACIC ECHOCARDIOGRAM;  Surgeon: Sherren Mocha, MD;  Location: [facility];  Service: Open Heart Surgery;  Laterality: N/A;   PACEMAKER IMPLANT N/A 03/20/2018   Procedure: PACEMAKER IMPLANT;  Surgeon: Constance Haw, MD;  Location: [facility];  Service: Cardiovascular;  Laterality: N/A;   RIGHT HEART CATH N/A 03/20/2018   Procedure: RIGHT HEART CATH;  Surgeon: Sherren Mocha, MD;  Location: [facility];  Service: Cardiovascular;  Laterality: N/A;   RIGHT/LEFT HEART CATH AND CORONARY ANGIOGRAPHY N/A 02/06/2018   Procedure: RIGHT/LEFT HEART CATH AND CORONARY ANGIOGRAPHY;  Surgeon: Sherren Mocha, MD;  Location: [facility];  Service: Cardiovascular;  Laterality: N/A;   TONSILLECTOMY     TRANSCATHETER AORTIC VALVE REPLACEMENT, TRANSFEMORAL N/A 03/18/2018   Procedure: TRANSCATHETER AORTIC VALVE REPLACEMENT, TRANSFEMORAL;  Surgeon: Sherren Mocha, MD;  Location: [facility];  Service: Open Heart Surgery;  Laterality: N/A;   WISDOM TOOTH EXTRACTION      Current Medications: No outpatient medications have been marked as taking for the 04/23/22 encounter (Appointment) with Richardo Priest, MD.     Allergies:   Clarithromycin, Latex, Levofloxacin, Azithromycin, and Ivp dye [iodinated contrast media]   Social History   Socioeconomic History   Marital status: Married    Spouse name: Not on file   Number of children: Not on file   Years of education: Not on file   Highest education level: Not on file  Occupational History   Not on  file  Tobacco Use   Smoking status: Never    Passive exposure: Never   Smokeless tobacco: Never  Vaping Use   Vaping Use: Never used  Substance and Sexual Activity   Alcohol use: No   Drug use: No   Sexual activity: Not on file  Other Topics Concern   Not on file  Social History Narrative   Not on file   Social Determinants of Health   Financial Resource Strain: Not on file  Food Insecurity: Not on file  Transportation Needs: Not on file  Physical Activity: Not on file  Stress: Not on file  Social Connections: Not on file     Family History: The patient's ***family history includes Congenital heart disease in her brother; Heart attack in her brother; Heart disease in her mother; Uterine cancer in her sister. ROS:   Please see the history of present illness.    All other systems reviewed and are negative.  EKGs/Labs/Other Studies Reviewed:    The following studies were reviewed today:  EKG:  EKG ordered today and personally reviewed.  The ekg ordered today demonstrates ***  Recent Labs: 11/07/2021: BUN 42; Creat 1.81; Hemoglobin 12.2; Platelets 281; Potassium 4.6; Sodium 142  Recent Lipid Panel    Component Value Date/Time   CHOL 217 (H) 04/10/2021 1357   TRIG 180 (H) 04/10/2021 1357   HDL 78 04/10/2021 1357   CHOLHDL 2.8 04/10/2021 1357   LDLCALC 108 (H) 04/10/2021 1357    Physical Exam:    VS:  There were no vitals taken for this visit.    Wt Readings from Last 3 Encounters:  01/08/22 106 lb 3.2 oz (48.2 kg)  10/27/21 107 lb (48.5 kg)  10/18/21 109 lb 3.2 oz (49.5 kg)     GEN: *** Well nourished, well developed in no acute distress HEENT: Normal NECK: No JVD; No carotid bruits LYMPHATICS: No lymphadenopathy CARDIAC: ***RRR, no murmurs, rubs, gallops RESPIRATORY:  Clear to auscultation without rales, wheezing or rhonchi  ABDOMEN: Soft, non-tender, non-distended MUSCULOSKELETAL:  No edema; No deformity  SKIN: Warm and dry NEUROLOGIC:  Alert and  oriented x 3 PSYCHIATRIC:  Normal affect    Signed, Shirlee More, MD  04/22/2022 12:53 PM    [facility]

## 2022-04-23 ENCOUNTER — Ambulatory Visit: Payer: Medicare Other | Admitting: Cardiology

## 2022-04-23 DIAGNOSIS — Z7982 Long term (current) use of aspirin: Secondary | ICD-10-CM | POA: Diagnosis not present

## 2022-04-23 DIAGNOSIS — I10 Essential (primary) hypertension: Secondary | ICD-10-CM | POA: Diagnosis not present

## 2022-04-23 DIAGNOSIS — I2510 Atherosclerotic heart disease of native coronary artery without angina pectoris: Secondary | ICD-10-CM | POA: Diagnosis not present

## 2022-04-23 DIAGNOSIS — Z79899 Other long term (current) drug therapy: Secondary | ICD-10-CM | POA: Diagnosis not present

## 2022-04-23 DIAGNOSIS — Z950 Presence of cardiac pacemaker: Secondary | ICD-10-CM | POA: Diagnosis not present

## 2022-04-23 DIAGNOSIS — I252 Old myocardial infarction: Secondary | ICD-10-CM | POA: Diagnosis not present

## 2022-04-23 DIAGNOSIS — I340 Nonrheumatic mitral (valve) insufficiency: Secondary | ICD-10-CM | POA: Diagnosis not present

## 2022-04-23 DIAGNOSIS — Z881 Allergy status to other antibiotic agents status: Secondary | ICD-10-CM | POA: Diagnosis not present

## 2022-04-23 DIAGNOSIS — N183 Chronic kidney disease, stage 3 unspecified: Secondary | ICD-10-CM | POA: Diagnosis not present

## 2022-04-23 DIAGNOSIS — E785 Hyperlipidemia, unspecified: Secondary | ICD-10-CM | POA: Diagnosis not present

## 2022-04-23 DIAGNOSIS — I129 Hypertensive chronic kidney disease with stage 1 through stage 4 chronic kidney disease, or unspecified chronic kidney disease: Secondary | ICD-10-CM | POA: Diagnosis not present

## 2022-04-23 DIAGNOSIS — R2689 Other abnormalities of gait and mobility: Secondary | ICD-10-CM | POA: Diagnosis not present

## 2022-04-23 DIAGNOSIS — Z8673 Personal history of transient ischemic attack (TIA), and cerebral infarction without residual deficits: Secondary | ICD-10-CM | POA: Diagnosis not present

## 2022-04-23 DIAGNOSIS — I361 Nonrheumatic tricuspid (valve) insufficiency: Secondary | ICD-10-CM | POA: Diagnosis not present

## 2022-04-23 DIAGNOSIS — M109 Gout, unspecified: Secondary | ICD-10-CM | POA: Diagnosis not present

## 2022-04-23 DIAGNOSIS — Z91040 Latex allergy status: Secondary | ICD-10-CM | POA: Diagnosis not present

## 2022-04-23 DIAGNOSIS — M1990 Unspecified osteoarthritis, unspecified site: Secondary | ICD-10-CM | POA: Diagnosis not present

## 2022-04-23 DIAGNOSIS — Z952 Presence of prosthetic heart valve: Secondary | ICD-10-CM | POA: Diagnosis not present

## 2022-04-23 DIAGNOSIS — R42 Dizziness and giddiness: Secondary | ICD-10-CM | POA: Diagnosis not present

## 2022-04-24 DIAGNOSIS — I361 Nonrheumatic tricuspid (valve) insufficiency: Secondary | ICD-10-CM | POA: Diagnosis not present

## 2022-04-24 DIAGNOSIS — I340 Nonrheumatic mitral (valve) insufficiency: Secondary | ICD-10-CM | POA: Diagnosis not present

## 2022-04-30 DIAGNOSIS — I10 Essential (primary) hypertension: Secondary | ICD-10-CM | POA: Diagnosis not present

## 2022-04-30 DIAGNOSIS — N189 Chronic kidney disease, unspecified: Secondary | ICD-10-CM | POA: Diagnosis not present

## 2022-04-30 DIAGNOSIS — Z6822 Body mass index (BMI) 22.0-22.9, adult: Secondary | ICD-10-CM | POA: Diagnosis not present

## 2022-04-30 DIAGNOSIS — R42 Dizziness and giddiness: Secondary | ICD-10-CM | POA: Diagnosis not present

## 2022-04-30 DIAGNOSIS — I509 Heart failure, unspecified: Secondary | ICD-10-CM | POA: Diagnosis not present

## 2022-05-01 DIAGNOSIS — M2042 Other hammer toe(s) (acquired), left foot: Secondary | ICD-10-CM | POA: Diagnosis not present

## 2022-05-01 DIAGNOSIS — M2041 Other hammer toe(s) (acquired), right foot: Secondary | ICD-10-CM | POA: Diagnosis not present

## 2022-05-01 DIAGNOSIS — M216X2 Other acquired deformities of left foot: Secondary | ICD-10-CM | POA: Diagnosis not present

## 2022-05-02 DIAGNOSIS — H81399 Other peripheral vertigo, unspecified ear: Secondary | ICD-10-CM | POA: Diagnosis not present

## 2022-05-02 DIAGNOSIS — R531 Weakness: Secondary | ICD-10-CM | POA: Diagnosis not present

## 2022-05-02 DIAGNOSIS — I509 Heart failure, unspecified: Secondary | ICD-10-CM | POA: Diagnosis not present

## 2022-05-02 DIAGNOSIS — M1990 Unspecified osteoarthritis, unspecified site: Secondary | ICD-10-CM | POA: Diagnosis not present

## 2022-05-02 DIAGNOSIS — M109 Gout, unspecified: Secondary | ICD-10-CM | POA: Diagnosis not present

## 2022-05-02 DIAGNOSIS — R42 Dizziness and giddiness: Secondary | ICD-10-CM | POA: Diagnosis not present

## 2022-05-02 DIAGNOSIS — R0902 Hypoxemia: Secondary | ICD-10-CM | POA: Diagnosis not present

## 2022-05-02 DIAGNOSIS — Z9181 History of falling: Secondary | ICD-10-CM | POA: Diagnosis not present

## 2022-05-02 DIAGNOSIS — I130 Hypertensive heart and chronic kidney disease with heart failure and stage 1 through stage 4 chronic kidney disease, or unspecified chronic kidney disease: Secondary | ICD-10-CM | POA: Diagnosis not present

## 2022-05-02 DIAGNOSIS — N183 Chronic kidney disease, stage 3 unspecified: Secondary | ICD-10-CM | POA: Diagnosis not present

## 2022-05-02 DIAGNOSIS — Z950 Presence of cardiac pacemaker: Secondary | ICD-10-CM | POA: Diagnosis not present

## 2022-05-02 DIAGNOSIS — I252 Old myocardial infarction: Secondary | ICD-10-CM | POA: Diagnosis not present

## 2022-05-02 DIAGNOSIS — Z952 Presence of prosthetic heart valve: Secondary | ICD-10-CM | POA: Diagnosis not present

## 2022-05-02 DIAGNOSIS — Z7982 Long term (current) use of aspirin: Secondary | ICD-10-CM | POA: Diagnosis not present

## 2022-05-02 DIAGNOSIS — I2510 Atherosclerotic heart disease of native coronary artery without angina pectoris: Secondary | ICD-10-CM | POA: Diagnosis not present

## 2022-05-02 DIAGNOSIS — I10 Essential (primary) hypertension: Secondary | ICD-10-CM | POA: Diagnosis not present

## 2022-05-02 DIAGNOSIS — Z79899 Other long term (current) drug therapy: Secondary | ICD-10-CM | POA: Diagnosis not present

## 2022-05-02 DIAGNOSIS — I959 Hypotension, unspecified: Secondary | ICD-10-CM | POA: Diagnosis not present

## 2022-05-02 DIAGNOSIS — E7800 Pure hypercholesterolemia, unspecified: Secondary | ICD-10-CM | POA: Diagnosis not present

## 2022-05-04 DIAGNOSIS — M109 Gout, unspecified: Secondary | ICD-10-CM | POA: Diagnosis not present

## 2022-05-04 DIAGNOSIS — I2510 Atherosclerotic heart disease of native coronary artery without angina pectoris: Secondary | ICD-10-CM | POA: Diagnosis not present

## 2022-05-04 DIAGNOSIS — M1990 Unspecified osteoarthritis, unspecified site: Secondary | ICD-10-CM | POA: Diagnosis not present

## 2022-05-04 DIAGNOSIS — I509 Heart failure, unspecified: Secondary | ICD-10-CM | POA: Diagnosis not present

## 2022-05-04 DIAGNOSIS — N183 Chronic kidney disease, stage 3 unspecified: Secondary | ICD-10-CM | POA: Diagnosis not present

## 2022-05-04 DIAGNOSIS — I130 Hypertensive heart and chronic kidney disease with heart failure and stage 1 through stage 4 chronic kidney disease, or unspecified chronic kidney disease: Secondary | ICD-10-CM | POA: Diagnosis not present

## 2022-05-07 DIAGNOSIS — I2510 Atherosclerotic heart disease of native coronary artery without angina pectoris: Secondary | ICD-10-CM | POA: Diagnosis not present

## 2022-05-07 DIAGNOSIS — I509 Heart failure, unspecified: Secondary | ICD-10-CM | POA: Diagnosis not present

## 2022-05-07 DIAGNOSIS — M109 Gout, unspecified: Secondary | ICD-10-CM | POA: Diagnosis not present

## 2022-05-07 DIAGNOSIS — M1990 Unspecified osteoarthritis, unspecified site: Secondary | ICD-10-CM | POA: Diagnosis not present

## 2022-05-07 DIAGNOSIS — N183 Chronic kidney disease, stage 3 unspecified: Secondary | ICD-10-CM | POA: Diagnosis not present

## 2022-05-07 DIAGNOSIS — I130 Hypertensive heart and chronic kidney disease with heart failure and stage 1 through stage 4 chronic kidney disease, or unspecified chronic kidney disease: Secondary | ICD-10-CM | POA: Diagnosis not present

## 2022-05-09 DIAGNOSIS — Z79899 Other long term (current) drug therapy: Secondary | ICD-10-CM | POA: Diagnosis not present

## 2022-05-09 DIAGNOSIS — M2550 Pain in unspecified joint: Secondary | ICD-10-CM | POA: Diagnosis not present

## 2022-05-09 DIAGNOSIS — N1831 Chronic kidney disease, stage 3a: Secondary | ICD-10-CM | POA: Diagnosis not present

## 2022-05-09 DIAGNOSIS — M109 Gout, unspecified: Secondary | ICD-10-CM | POA: Diagnosis not present

## 2022-05-09 DIAGNOSIS — M1990 Unspecified osteoarthritis, unspecified site: Secondary | ICD-10-CM | POA: Diagnosis not present

## 2022-05-09 DIAGNOSIS — M79673 Pain in unspecified foot: Secondary | ICD-10-CM | POA: Diagnosis not present

## 2022-05-09 DIAGNOSIS — I130 Hypertensive heart and chronic kidney disease with heart failure and stage 1 through stage 4 chronic kidney disease, or unspecified chronic kidney disease: Secondary | ICD-10-CM | POA: Diagnosis not present

## 2022-05-09 DIAGNOSIS — I509 Heart failure, unspecified: Secondary | ICD-10-CM | POA: Diagnosis not present

## 2022-05-10 DIAGNOSIS — I130 Hypertensive heart and chronic kidney disease with heart failure and stage 1 through stage 4 chronic kidney disease, or unspecified chronic kidney disease: Secondary | ICD-10-CM | POA: Diagnosis not present

## 2022-05-10 DIAGNOSIS — M109 Gout, unspecified: Secondary | ICD-10-CM | POA: Diagnosis not present

## 2022-05-10 DIAGNOSIS — M1990 Unspecified osteoarthritis, unspecified site: Secondary | ICD-10-CM | POA: Diagnosis not present

## 2022-05-10 DIAGNOSIS — N183 Chronic kidney disease, stage 3 unspecified: Secondary | ICD-10-CM | POA: Diagnosis not present

## 2022-05-10 DIAGNOSIS — I2510 Atherosclerotic heart disease of native coronary artery without angina pectoris: Secondary | ICD-10-CM | POA: Diagnosis not present

## 2022-05-10 DIAGNOSIS — I509 Heart failure, unspecified: Secondary | ICD-10-CM | POA: Diagnosis not present

## 2022-05-11 DIAGNOSIS — H8110 Benign paroxysmal vertigo, unspecified ear: Secondary | ICD-10-CM | POA: Diagnosis not present

## 2022-05-11 DIAGNOSIS — M109 Gout, unspecified: Secondary | ICD-10-CM | POA: Diagnosis not present

## 2022-05-11 DIAGNOSIS — I2510 Atherosclerotic heart disease of native coronary artery without angina pectoris: Secondary | ICD-10-CM | POA: Diagnosis not present

## 2022-05-11 DIAGNOSIS — M1990 Unspecified osteoarthritis, unspecified site: Secondary | ICD-10-CM | POA: Diagnosis not present

## 2022-05-11 DIAGNOSIS — I509 Heart failure, unspecified: Secondary | ICD-10-CM | POA: Diagnosis not present

## 2022-05-11 DIAGNOSIS — I130 Hypertensive heart and chronic kidney disease with heart failure and stage 1 through stage 4 chronic kidney disease, or unspecified chronic kidney disease: Secondary | ICD-10-CM | POA: Diagnosis not present

## 2022-05-11 DIAGNOSIS — N183 Chronic kidney disease, stage 3 unspecified: Secondary | ICD-10-CM | POA: Diagnosis not present

## 2022-05-14 DIAGNOSIS — N183 Chronic kidney disease, stage 3 unspecified: Secondary | ICD-10-CM | POA: Diagnosis not present

## 2022-05-14 DIAGNOSIS — M109 Gout, unspecified: Secondary | ICD-10-CM | POA: Diagnosis not present

## 2022-05-14 DIAGNOSIS — I2510 Atherosclerotic heart disease of native coronary artery without angina pectoris: Secondary | ICD-10-CM | POA: Diagnosis not present

## 2022-05-14 DIAGNOSIS — I130 Hypertensive heart and chronic kidney disease with heart failure and stage 1 through stage 4 chronic kidney disease, or unspecified chronic kidney disease: Secondary | ICD-10-CM | POA: Diagnosis not present

## 2022-05-14 DIAGNOSIS — I509 Heart failure, unspecified: Secondary | ICD-10-CM | POA: Diagnosis not present

## 2022-05-14 DIAGNOSIS — M1990 Unspecified osteoarthritis, unspecified site: Secondary | ICD-10-CM | POA: Diagnosis not present

## 2022-05-18 DIAGNOSIS — N183 Chronic kidney disease, stage 3 unspecified: Secondary | ICD-10-CM | POA: Diagnosis not present

## 2022-05-18 DIAGNOSIS — M109 Gout, unspecified: Secondary | ICD-10-CM | POA: Diagnosis not present

## 2022-05-18 DIAGNOSIS — M1990 Unspecified osteoarthritis, unspecified site: Secondary | ICD-10-CM | POA: Diagnosis not present

## 2022-05-18 DIAGNOSIS — I509 Heart failure, unspecified: Secondary | ICD-10-CM | POA: Diagnosis not present

## 2022-05-18 DIAGNOSIS — I130 Hypertensive heart and chronic kidney disease with heart failure and stage 1 through stage 4 chronic kidney disease, or unspecified chronic kidney disease: Secondary | ICD-10-CM | POA: Diagnosis not present

## 2022-05-18 DIAGNOSIS — I2510 Atherosclerotic heart disease of native coronary artery without angina pectoris: Secondary | ICD-10-CM | POA: Diagnosis not present

## 2022-05-23 DIAGNOSIS — I509 Heart failure, unspecified: Secondary | ICD-10-CM | POA: Diagnosis not present

## 2022-05-23 DIAGNOSIS — N183 Chronic kidney disease, stage 3 unspecified: Secondary | ICD-10-CM | POA: Diagnosis not present

## 2022-05-23 DIAGNOSIS — M1990 Unspecified osteoarthritis, unspecified site: Secondary | ICD-10-CM | POA: Diagnosis not present

## 2022-05-23 DIAGNOSIS — M109 Gout, unspecified: Secondary | ICD-10-CM | POA: Diagnosis not present

## 2022-05-23 DIAGNOSIS — I2510 Atherosclerotic heart disease of native coronary artery without angina pectoris: Secondary | ICD-10-CM | POA: Diagnosis not present

## 2022-05-23 DIAGNOSIS — I130 Hypertensive heart and chronic kidney disease with heart failure and stage 1 through stage 4 chronic kidney disease, or unspecified chronic kidney disease: Secondary | ICD-10-CM | POA: Diagnosis not present

## 2022-05-24 DIAGNOSIS — M1990 Unspecified osteoarthritis, unspecified site: Secondary | ICD-10-CM | POA: Diagnosis not present

## 2022-05-24 DIAGNOSIS — N183 Chronic kidney disease, stage 3 unspecified: Secondary | ICD-10-CM | POA: Diagnosis not present

## 2022-05-24 DIAGNOSIS — I130 Hypertensive heart and chronic kidney disease with heart failure and stage 1 through stage 4 chronic kidney disease, or unspecified chronic kidney disease: Secondary | ICD-10-CM | POA: Diagnosis not present

## 2022-05-24 DIAGNOSIS — M109 Gout, unspecified: Secondary | ICD-10-CM | POA: Diagnosis not present

## 2022-05-24 DIAGNOSIS — I2510 Atherosclerotic heart disease of native coronary artery without angina pectoris: Secondary | ICD-10-CM | POA: Diagnosis not present

## 2022-05-24 DIAGNOSIS — I509 Heart failure, unspecified: Secondary | ICD-10-CM | POA: Diagnosis not present

## 2022-05-28 DIAGNOSIS — I130 Hypertensive heart and chronic kidney disease with heart failure and stage 1 through stage 4 chronic kidney disease, or unspecified chronic kidney disease: Secondary | ICD-10-CM | POA: Diagnosis not present

## 2022-05-28 DIAGNOSIS — M1990 Unspecified osteoarthritis, unspecified site: Secondary | ICD-10-CM | POA: Diagnosis not present

## 2022-05-28 DIAGNOSIS — M109 Gout, unspecified: Secondary | ICD-10-CM | POA: Diagnosis not present

## 2022-05-28 DIAGNOSIS — I509 Heart failure, unspecified: Secondary | ICD-10-CM | POA: Diagnosis not present

## 2022-05-28 DIAGNOSIS — N183 Chronic kidney disease, stage 3 unspecified: Secondary | ICD-10-CM | POA: Diagnosis not present

## 2022-05-28 DIAGNOSIS — I2510 Atherosclerotic heart disease of native coronary artery without angina pectoris: Secondary | ICD-10-CM | POA: Diagnosis not present

## 2022-05-31 DIAGNOSIS — I130 Hypertensive heart and chronic kidney disease with heart failure and stage 1 through stage 4 chronic kidney disease, or unspecified chronic kidney disease: Secondary | ICD-10-CM | POA: Diagnosis not present

## 2022-05-31 DIAGNOSIS — M1990 Unspecified osteoarthritis, unspecified site: Secondary | ICD-10-CM | POA: Diagnosis not present

## 2022-05-31 DIAGNOSIS — N183 Chronic kidney disease, stage 3 unspecified: Secondary | ICD-10-CM | POA: Diagnosis not present

## 2022-05-31 DIAGNOSIS — I509 Heart failure, unspecified: Secondary | ICD-10-CM | POA: Diagnosis not present

## 2022-05-31 DIAGNOSIS — I2510 Atherosclerotic heart disease of native coronary artery without angina pectoris: Secondary | ICD-10-CM | POA: Diagnosis not present

## 2022-05-31 DIAGNOSIS — M109 Gout, unspecified: Secondary | ICD-10-CM | POA: Diagnosis not present

## 2022-06-01 DIAGNOSIS — M109 Gout, unspecified: Secondary | ICD-10-CM | POA: Diagnosis not present

## 2022-06-01 DIAGNOSIS — I252 Old myocardial infarction: Secondary | ICD-10-CM | POA: Diagnosis not present

## 2022-06-01 DIAGNOSIS — I2510 Atherosclerotic heart disease of native coronary artery without angina pectoris: Secondary | ICD-10-CM | POA: Diagnosis not present

## 2022-06-01 DIAGNOSIS — Z7982 Long term (current) use of aspirin: Secondary | ICD-10-CM | POA: Diagnosis not present

## 2022-06-01 DIAGNOSIS — Z950 Presence of cardiac pacemaker: Secondary | ICD-10-CM | POA: Diagnosis not present

## 2022-06-01 DIAGNOSIS — Z9181 History of falling: Secondary | ICD-10-CM | POA: Diagnosis not present

## 2022-06-01 DIAGNOSIS — M1990 Unspecified osteoarthritis, unspecified site: Secondary | ICD-10-CM | POA: Diagnosis not present

## 2022-06-01 DIAGNOSIS — I509 Heart failure, unspecified: Secondary | ICD-10-CM | POA: Diagnosis not present

## 2022-06-01 DIAGNOSIS — Z952 Presence of prosthetic heart valve: Secondary | ICD-10-CM | POA: Diagnosis not present

## 2022-06-01 DIAGNOSIS — E7800 Pure hypercholesterolemia, unspecified: Secondary | ICD-10-CM | POA: Diagnosis not present

## 2022-06-01 DIAGNOSIS — N183 Chronic kidney disease, stage 3 unspecified: Secondary | ICD-10-CM | POA: Diagnosis not present

## 2022-06-01 DIAGNOSIS — I130 Hypertensive heart and chronic kidney disease with heart failure and stage 1 through stage 4 chronic kidney disease, or unspecified chronic kidney disease: Secondary | ICD-10-CM | POA: Diagnosis not present

## 2022-06-05 DIAGNOSIS — M1990 Unspecified osteoarthritis, unspecified site: Secondary | ICD-10-CM | POA: Diagnosis not present

## 2022-06-05 DIAGNOSIS — I130 Hypertensive heart and chronic kidney disease with heart failure and stage 1 through stage 4 chronic kidney disease, or unspecified chronic kidney disease: Secondary | ICD-10-CM | POA: Diagnosis not present

## 2022-06-05 DIAGNOSIS — I509 Heart failure, unspecified: Secondary | ICD-10-CM | POA: Diagnosis not present

## 2022-06-05 DIAGNOSIS — N183 Chronic kidney disease, stage 3 unspecified: Secondary | ICD-10-CM | POA: Diagnosis not present

## 2022-06-05 DIAGNOSIS — I2510 Atherosclerotic heart disease of native coronary artery without angina pectoris: Secondary | ICD-10-CM | POA: Diagnosis not present

## 2022-06-05 DIAGNOSIS — M109 Gout, unspecified: Secondary | ICD-10-CM | POA: Diagnosis not present

## 2022-06-07 DIAGNOSIS — H61303 Acquired stenosis of external ear canal, unspecified, bilateral: Secondary | ICD-10-CM | POA: Diagnosis not present

## 2022-06-07 DIAGNOSIS — E860 Dehydration: Secondary | ICD-10-CM | POA: Diagnosis not present

## 2022-06-07 DIAGNOSIS — H903 Sensorineural hearing loss, bilateral: Secondary | ICD-10-CM | POA: Diagnosis not present

## 2022-06-07 DIAGNOSIS — R531 Weakness: Secondary | ICD-10-CM | POA: Diagnosis not present

## 2022-06-07 DIAGNOSIS — I10 Essential (primary) hypertension: Secondary | ICD-10-CM | POA: Diagnosis not present

## 2022-06-07 DIAGNOSIS — R42 Dizziness and giddiness: Secondary | ICD-10-CM | POA: Diagnosis not present

## 2022-06-13 DIAGNOSIS — N183 Chronic kidney disease, stage 3 unspecified: Secondary | ICD-10-CM | POA: Diagnosis not present

## 2022-06-13 DIAGNOSIS — I130 Hypertensive heart and chronic kidney disease with heart failure and stage 1 through stage 4 chronic kidney disease, or unspecified chronic kidney disease: Secondary | ICD-10-CM | POA: Diagnosis not present

## 2022-06-13 DIAGNOSIS — M1990 Unspecified osteoarthritis, unspecified site: Secondary | ICD-10-CM | POA: Diagnosis not present

## 2022-06-13 DIAGNOSIS — I509 Heart failure, unspecified: Secondary | ICD-10-CM | POA: Diagnosis not present

## 2022-06-13 DIAGNOSIS — M109 Gout, unspecified: Secondary | ICD-10-CM | POA: Diagnosis not present

## 2022-06-13 DIAGNOSIS — I2510 Atherosclerotic heart disease of native coronary artery without angina pectoris: Secondary | ICD-10-CM | POA: Diagnosis not present

## 2022-06-15 ENCOUNTER — Telehealth: Payer: Self-pay | Admitting: Cardiology

## 2022-06-15 DIAGNOSIS — I509 Heart failure, unspecified: Secondary | ICD-10-CM | POA: Diagnosis not present

## 2022-06-15 DIAGNOSIS — N183 Chronic kidney disease, stage 3 unspecified: Secondary | ICD-10-CM | POA: Diagnosis not present

## 2022-06-15 DIAGNOSIS — I2510 Atherosclerotic heart disease of native coronary artery without angina pectoris: Secondary | ICD-10-CM | POA: Diagnosis not present

## 2022-06-15 DIAGNOSIS — M1990 Unspecified osteoarthritis, unspecified site: Secondary | ICD-10-CM | POA: Diagnosis not present

## 2022-06-15 DIAGNOSIS — M109 Gout, unspecified: Secondary | ICD-10-CM | POA: Diagnosis not present

## 2022-06-15 DIAGNOSIS — I130 Hypertensive heart and chronic kidney disease with heart failure and stage 1 through stage 4 chronic kidney disease, or unspecified chronic kidney disease: Secondary | ICD-10-CM | POA: Diagnosis not present

## 2022-06-15 MED ORDER — HYDRALAZINE HCL 25 MG PO TABS: 25.0000 mg | ORAL_TABLET | Freq: Three times a day (TID) | ORAL | 0 refills | Status: DC | PRN

## 2022-06-15 NOTE — Telephone Encounter (Signed)
*  STAT* If patient is at the pharmacy, call can be transferred to refill team.   1. Which medications need to be refilled? (please list name of each medication and dose if known)   hydrALAZINE (APRESOLINE) 25 MG tablet    2. Which pharmacy/location (including street and city if local pharmacy) is medication to be sent to? [facility], [place] 3. Do they need a 30 day or 90 day supply?  30 day   Pt completely out of medication and has scheduled appt on 06/19/22.

## 2022-06-15 NOTE — Telephone Encounter (Signed)
Hydralazine 25 mg # 90 only sent to [facility]

## 2022-06-18 NOTE — Progress Notes (Unsigned)
Cardiology Office Note:    Date:  06/19/2022   ID:  Kaitlin Villarreal, DOB 1926/06/01, MRN [id number]  PCP:  Angelina Sheriff, MD  Cardiologist:  Shirlee More, MD    Referring MD: Angelina Sheriff, MD    ASSESSMENT:    1. S/P TAVR (transcatheter aortic valve replacement)   2. 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 Pacemaker   4. Chronic diastolic heart failure ([place])   5. 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  Clinically she does not have heart failure Stable pacemaker function followed in our device clinic Heart failure is compensated she will continue her loop diuretic I have asked the daughter to get involved to trend her blood pressures at home now that she is taking hydralazine again and continue her multidrug regimen including distal MRA Inspra lisinopril metoprolol amlodipine furosemide and hydralazine for hypertension.  Recheck renal function today.  Her daughter thinks blood pressures have been sent to her PCP office by the visiting nurse.   Next appointment: 6 months   Medication Adjustments/Labs and Tests Ordered: Current medicines are reviewed at length with the patient today.  Concerns regarding medicines are outlined above.  Orders Placed This Encounter  Procedures   Basic Metabolic Panel (BMET)   No orders of the defined types were placed in this encounter.   Chief Complaint  Patient presents with   Follow-up    After TAVR    History of Present Illness:    Kaitlin Villarreal is a 88 y.o. female with a hx of severe symptomatic aortic stenosis with TAVR June 2019 permanent pacemaker following TAVR for heart block hypertensive heart disease and chronic kidney disease last seen 10/18/2021.   She complained bitterly of foot pain related to gout and gouty arthritis.  She complained of limb pain and abnormal ABI bilaterally and abnormal TBI.  She had an ABI [facility] in July showing mild paravalvular aortic regurgitation VSD severe tricuspid regurgitation and severe pulmonary artery hypertension.  Recent labs 050 07/2022 creatinine 1.64 GFR 27 cc/min hemoglobin 11.4  Compliance with diet, lifestyle and medications: Yes  Her daughter lives in [place] 15 miles away is now involved in both her parents care her father is struggling with frailty and weakness and treatment for his lung cancer with radiation and chemotherapy After she left the hospital she has had no further episodes of vertigo they did not bring a list of blood pressure but it was well controlled until she ran out of hydralazine.  She refilled the prescription yesterday has taken 1 tablet.  She is under the impression she is only to take this as needed Very concerned about noncompliance and ability to care for self and encouraged the daughter to fill her pillbox and check her mother's compliance with her medications. She is not having syncope chest pain palpitation edema or shortness of breath. Past Medical History:  Diagnosis Date   Acute on chronic diastolic heart failure ([place]) 03/18/2018   Aortic stenosis 10/14/2014   Formatting of this note might be different from the original.  moderate by echo 11/2015   Arthritis    Bilateral renal masses 07/26/2017   Bone spur of toe  of left foot    Chronic diastolic (congestive) heart failure (HCC)    Chronic diastolic heart failure ([place]) 03/18/2018   CKD (chronic kidney disease) stage 3, GFR 30-59 ml/min (HCC) 07/26/2017   Essential hypertension    GERD (gastroesophageal reflux disease)    HLD (hyperlipidemia)    Hyperlipidemia 11/25/2016   Pacemaker 09/02/2018   Pancreatic lesion 04/17/2018   Pancreatic mass    a. benign appearing but needs f/u, noted on pre TAVR CTs    Plantar fat pad atrophy of left foot    S/P TAVR (transcatheter aortic valve replacement) 03/18/2018   23 mm Edwards Sapien 3 transcatheter heart valve placed via percutaneous right transfemoral approach    Severe aortic stenosis    a. 03/2018: s/p TAVR by Burt Knack and Dr. Roxy Manns   Stage 3 chronic kidney disease ([place])    TIA (transient ischemic attack)     a. 1992   VSD (ventricular septal defect)     Past Surgical History:  Procedure Laterality Date   ABDOMINAL HYSTERECTOMY     APPENDECTOMY     HERNIA REPAIR     INTRAOPERATIVE TRANSTHORACIC ECHOCARDIOGRAM N/A 03/18/2018   Procedure: INTRAOPERATIVE TRANSTHORACIC ECHOCARDIOGRAM;  Surgeon: Sherren Mocha, MD;  Location: [place];  Service: Open Heart Surgery;  Laterality: N/A;   PACEMAKER IMPLANT N/A 03/20/2018   Procedure: PACEMAKER IMPLANT;  Surgeon: Constance Haw, MD;  Location: [place] [facility];  Service: Cardiovascular;  Laterality: N/A;   RIGHT HEART CATH N/A 03/20/2018   Procedure: RIGHT HEART CATH;  Surgeon: Sherren Mocha, MD;  Location: [place] [facility];  Service: Cardiovascular;  Laterality: N/A;   RIGHT/LEFT HEART CATH AND CORONARY ANGIOGRAPHY N/A 02/06/2018   Procedure: RIGHT/LEFT HEART CATH AND CORONARY ANGIOGRAPHY;  Surgeon: Sherren Mocha, MD;  Location: [place] [facility];  Service: Cardiovascular;  Laterality: N/A;   TONSILLECTOMY     TRANSCATHETER AORTIC VALVE REPLACEMENT, TRANSFEMORAL N/A 03/18/2018   Procedure: TRANSCATHETER AORTIC VALVE REPLACEMENT, TRANSFEMORAL;  Surgeon: Sherren Mocha, MD;  Location: [place];  Service: Open Heart Surgery;  Laterality: N/A;   WISDOM TOOTH EXTRACTION      Current Medications: Current Meds  Medication Sig   acetaminophen (TYLENOL) 650 MG CR tablet Take 650-1,300 mg by mouth every 8 (eight) hours as needed for pain.   Allopurinol 200 MG TABS Take 200 mg by mouth daily.   amoxicillin (AMOXIL) 500 MG tablet Take 4 tablets (2,000 mg total) by mouth as directed. 1 hour prior to dental  work including cleanings   aspirin EC 81 MG tablet Take 81 mg by mouth daily.    Carboxymethylcellulose Sod PF (THERATEARS PF) 0.25 % SOLN Place 1 drop into both eyes in the morning and at bedtime.   colchicine 0.6 MG tablet Take 0.5 tablets (0.3 mg total) by mouth daily. (Patient taking differently: Take 0.6 mg by mouth every other day.)   Cyanocobalamin (B-12) 2500 MCG TABS Take 2,500 mcg by mouth daily.    eplerenone (INSPRA) 25 MG tablet Take 1 tablet by mouth once daily   estradiol (ESTRACE) 1 MG tablet Take 0.5 mg by mouth daily.   fluticasone (FLONASE) 50 MCG/ACT nasal spray Place 2 sprays into both nostrils daily as needed for allergies or rhinitis.    furosemide (LASIX) 20 MG tablet Take 20 mg by mouth daily.   hydrALAZINE (APRESOLINE) 25 MG tablet Take 1 tablet (25 mg total) by mouth 3 (three) times daily as needed. When BP over 180   lisinopril (ZESTRIL) 20 MG  tablet Take 1 tablet by mouth once daily   meclizine (ANTIVERT) 25 MG tablet Take 25 mg by mouth every 8 (eight) hours as needed for dizziness.   metoprolol tartrate (LOPRESSOR) 50 MG tablet Take 1 tablet by mouth twice daily   Multiple Vitamins-Minerals (MULTI-VITAMIN GUMMIES) CHEW Chew 2 tablets by mouth daily.    Omega-3 Fatty Acids (FISH OIL) 1000 MG CAPS Take 1,000 mg by mouth daily.   Probiotic Product ([employer] PROBIOTIC) CHEW Chew 2 each by mouth daily after supper.    triamcinolone cream (KENALOG) 0.5 % Apply 3 application topically 3 (three) times daily as needed (for itching- AFFECTED AREAS).    Vitamin D, Ergocalciferol, (DRISDOL) 1.25 MG (50000 UT) CAPS capsule Take 50,000 Units by mouth See admin instructions. Take 50,000 units by mouth 2 times a week- Mondays and Fridays     Allergies:   Clarithromycin, Latex, Levofloxacin, Azithromycin, Diphenhydramine hcl, and Ivp dye [iodinated contrast media]   Social History   Socioeconomic History   Marital status: Married    Spouse name: Not on file   Number of children:  Not on file   Years of education: Not on file   Highest education level: Not on file  Occupational History   Not on file  Tobacco Use   Smoking status: Never    Passive exposure: Never   Smokeless tobacco: Never  Vaping Use   Vaping Use: Never used  Substance and Sexual Activity   Alcohol use: No   Drug use: No   Sexual activity: Not on file  Other Topics Concern   Not on file  Social History Narrative   Not on file   Social Determinants of Health   Financial Resource Strain: Not on file  Food Insecurity: Not on file  Transportation Needs: Not on file  Physical Activity: Not on file  Stress: Not on file  Social Connections: Not on file     Family History: The patient's family history includes Congenital heart disease in her brother; Heart attack in her brother; Heart disease in her mother; Uterine cancer in her sister. ROS:   Please see the history of present illness.    All other systems reviewed and are negative.  EKGs/Labs/Other Studies Reviewed:    The following studies were reviewed today:  Recent Labs: 11/07/2021: BUN 42; Creat 1.81; Hemoglobin 12.2; Platelets 281; Potassium 4.6; Sodium 142  Recent Lipid Panel    Component Value Date/Time   CHOL 217 (H) 04/10/2021 1357   TRIG 180 (H) 04/10/2021 1357   HDL 78 04/10/2021 1357   CHOLHDL 2.8 04/10/2021 1357   LDLCALC 108 (H) 04/10/2021 1357    Physical Exam:    VS:  BP (!) 198/62 (BP Location: Right Arm, Patient Position: Sitting, Cuff Size: Small)   Pulse 68   Ht 5' (1.524 m)   Wt 105 lb 3.2 oz (47.7 kg)   SpO2 98%   BMI 20.55 kg/m     Wt Readings from Last 3 Encounters:  06/19/22 105 lb 3.2 oz (47.7 kg)  01/08/22 106 lb 3.2 oz (48.2 kg)  10/27/21 107 lb (48.5 kg)     GEN: She is very frail well nourished, well developed in no acute distress HEENT: Normal NECK: No JVD; No carotid bruits LYMPHATICS: No lymphadenopathy CARDIAC: 2/6 holosystolic murmur left sternal border RRR, no murmurs, rubs,  gallops RESPIRATORY:  Clear to auscultation without rales, wheezing or rhonchi  ABDOMEN: Soft, non-tender, non-distended MUSCULOSKELETAL:  No edema; No deformity  SKIN: Warm and  dry NEUROLOGIC:  Alert and oriented x 3 PSYCHIATRIC:  Normal affect    Signed, Shirlee More, MD  06/19/2022 2:55 PM    [place]

## 2022-06-19 ENCOUNTER — Encounter: Payer: Self-pay | Admitting: Cardiology

## 2022-06-19 ENCOUNTER — Ambulatory Visit: Payer: Medicare Other | Attending: Cardiology | Admitting: Cardiology

## 2022-06-19 VITALS — BP 198/62 | HR 68 | Ht 60.0 in | Wt 105.2 lb

## 2022-06-19 DIAGNOSIS — Z952 Presence of prosthetic heart valve: Secondary | ICD-10-CM | POA: Diagnosis not present

## 2022-06-19 DIAGNOSIS — N183 Chronic kidney disease, stage 3 unspecified: Secondary | ICD-10-CM | POA: Diagnosis not present

## 2022-06-19 DIAGNOSIS — I442 Atrioventricular block, complete: Secondary | ICD-10-CM | POA: Diagnosis not present

## 2022-06-19 DIAGNOSIS — I130 Hypertensive heart and chronic kidney disease with heart failure and stage 1 through stage 4 chronic kidney disease, or unspecified chronic kidney disease: Secondary | ICD-10-CM | POA: Insufficient documentation

## 2022-06-19 DIAGNOSIS — Z950 Presence of cardiac pacemaker: Secondary | ICD-10-CM | POA: Insufficient documentation

## 2022-06-19 DIAGNOSIS — I5032 Chronic diastolic (congestive) heart failure: Secondary | ICD-10-CM | POA: Insufficient documentation

## 2022-06-19 NOTE — Patient Instructions (Signed)
Medication Instructions:  Your physician recommends that you continue on your current medications as directed. Please refer to the Current Medication list given to you today.  *If you need a refill on your cardiac medications before your next appointment, please call your pharmacy*   Lab Work: Your physician recommends that you return for lab work in:   Labs today: BMP  If you have labs (blood work) drawn today and your tests are completely normal, you will receive your results only by: [place] (if you have [place]) OR A paper copy in the mail If you have any lab test that is abnormal or we need to change your treatment, we will call you to review the results.   Testing/Procedures: None   Follow-Up: At [facility], you and your health needs are our priority.  As part of our continuing mission to provide you with exceptional heart care, we have created designated Provider Care Teams.  These Care Teams include your primary Cardiologist (physician) and Advanced Practice Providers (APPs -  Physician Assistants and Nurse Practitioners) who all work together to provide you with the care you need, when you need it.  We recommend signing up for the patient portal called "MyChart".  Sign up information is provided on this After Visit Summary.  MyChart is used to connect with patients for Virtual Visits (Telemedicine).  Patients are able to view lab/test results, encounter notes, upcoming appointments, etc.  Non-urgent messages can be sent to your provider as well.   To learn more about what you can do with MyChart, go to [email or web address].    Your next appointment:   6 month(s)  The format for your next appointment:   In Person  Provider:   Shirlee More, MD    Other Instructions None  Important Information About Sugar

## 2022-06-20 ENCOUNTER — Ambulatory Visit (INDEPENDENT_AMBULATORY_CARE_PROVIDER_SITE_OTHER)

## 2022-06-20 DIAGNOSIS — Z6821 Body mass index (BMI) 21.0-21.9, adult: Secondary | ICD-10-CM | POA: Diagnosis not present

## 2022-06-20 DIAGNOSIS — I442 Atrioventricular block, complete: Secondary | ICD-10-CM

## 2022-06-20 DIAGNOSIS — K120 Recurrent oral aphthae: Secondary | ICD-10-CM | POA: Diagnosis not present

## 2022-06-20 LAB — BASIC METABOLIC PANEL
BUN/Creatinine Ratio: 21 (ref 12–28)
BUN: 41 mg/dL — ABNORMAL HIGH (ref 10–36)
CO2: 25 mmol/L (ref 20–29)
Calcium: 9.8 mg/dL (ref 8.7–10.3)
Chloride: 101 mmol/L (ref 96–106)
Creatinine, Ser: 1.95 mg/dL — ABNORMAL HIGH (ref 0.57–1.00)
Glucose: 84 mg/dL (ref 70–99)
Potassium: 5.5 mmol/L — ABNORMAL HIGH (ref 3.5–5.2)
Sodium: 142 mmol/L (ref 134–144)
eGFR: 23 mL/min/{1.73_m2} — ABNORMAL LOW (ref >59)

## 2022-06-22 DIAGNOSIS — M109 Gout, unspecified: Secondary | ICD-10-CM | POA: Diagnosis not present

## 2022-06-22 DIAGNOSIS — I2510 Atherosclerotic heart disease of native coronary artery without angina pectoris: Secondary | ICD-10-CM | POA: Diagnosis not present

## 2022-06-22 DIAGNOSIS — I130 Hypertensive heart and chronic kidney disease with heart failure and stage 1 through stage 4 chronic kidney disease, or unspecified chronic kidney disease: Secondary | ICD-10-CM | POA: Diagnosis not present

## 2022-06-22 DIAGNOSIS — M1990 Unspecified osteoarthritis, unspecified site: Secondary | ICD-10-CM | POA: Diagnosis not present

## 2022-06-22 DIAGNOSIS — N183 Chronic kidney disease, stage 3 unspecified: Secondary | ICD-10-CM | POA: Diagnosis not present

## 2022-06-22 DIAGNOSIS — I509 Heart failure, unspecified: Secondary | ICD-10-CM | POA: Diagnosis not present

## 2022-06-25 LAB — CUP PACEART REMOTE DEVICE CHECK
Battery Remaining Longevity: 85 mo
Battery Remaining Percentage: 67 %
Battery Voltage: 2.99 V
Brady Statistic AP VP Percent: 1 %
Brady Statistic AP VS Percent: 13 %
Brady Statistic AS VP Percent: 1 %
Brady Statistic AS VS Percent: 87 %
Brady Statistic RA Percent Paced: 13 %
Brady Statistic RV Percent Paced: 1 %
Date Time Interrogation Session: 20230920020015
Implantable Lead Implant Date: 20190620
Implantable Lead Implant Date: 20190620
Implantable Lead Location: 753859
Implantable Lead Location: 753860
Implantable Pulse Generator Implant Date: 20190620
Lead Channel Impedance Value: 350 Ohm
Lead Channel Impedance Value: 480 Ohm
Lead Channel Pacing Threshold Amplitude: 0.75 V
Lead Channel Pacing Threshold Amplitude: 0.75 V
Lead Channel Pacing Threshold Pulse Width: 0.5 ms
Lead Channel Pacing Threshold Pulse Width: 0.5 ms
Lead Channel Sensing Intrinsic Amplitude: 4.2 mV
Lead Channel Sensing Intrinsic Amplitude: 8.3 mV
Lead Channel Setting Pacing Amplitude: 1 V
Lead Channel Setting Pacing Amplitude: 2 V
Lead Channel Setting Pacing Pulse Width: 0.5 ms
Lead Channel Setting Sensing Sensitivity: 2 mV
Pulse Gen Model: 2272
Pulse Gen Serial Number: 9035930

## 2022-06-26 DIAGNOSIS — M109 Gout, unspecified: Secondary | ICD-10-CM | POA: Diagnosis not present

## 2022-06-26 DIAGNOSIS — N183 Chronic kidney disease, stage 3 unspecified: Secondary | ICD-10-CM | POA: Diagnosis not present

## 2022-06-26 DIAGNOSIS — I2510 Atherosclerotic heart disease of native coronary artery without angina pectoris: Secondary | ICD-10-CM | POA: Diagnosis not present

## 2022-06-26 DIAGNOSIS — M1990 Unspecified osteoarthritis, unspecified site: Secondary | ICD-10-CM | POA: Diagnosis not present

## 2022-06-26 DIAGNOSIS — I130 Hypertensive heart and chronic kidney disease with heart failure and stage 1 through stage 4 chronic kidney disease, or unspecified chronic kidney disease: Secondary | ICD-10-CM | POA: Diagnosis not present

## 2022-06-26 DIAGNOSIS — I509 Heart failure, unspecified: Secondary | ICD-10-CM | POA: Diagnosis not present

## 2022-06-28 DIAGNOSIS — I130 Hypertensive heart and chronic kidney disease with heart failure and stage 1 through stage 4 chronic kidney disease, or unspecified chronic kidney disease: Secondary | ICD-10-CM | POA: Diagnosis not present

## 2022-06-28 DIAGNOSIS — I2510 Atherosclerotic heart disease of native coronary artery without angina pectoris: Secondary | ICD-10-CM | POA: Diagnosis not present

## 2022-06-28 DIAGNOSIS — I509 Heart failure, unspecified: Secondary | ICD-10-CM | POA: Diagnosis not present

## 2022-06-28 DIAGNOSIS — M1990 Unspecified osteoarthritis, unspecified site: Secondary | ICD-10-CM | POA: Diagnosis not present

## 2022-06-28 DIAGNOSIS — N183 Chronic kidney disease, stage 3 unspecified: Secondary | ICD-10-CM | POA: Diagnosis not present

## 2022-06-28 DIAGNOSIS — M109 Gout, unspecified: Secondary | ICD-10-CM | POA: Diagnosis not present

## 2022-07-03 NOTE — Progress Notes (Signed)
Remote pacemaker transmission.   

## 2022-07-09 ENCOUNTER — Telehealth: Payer: Self-pay

## 2022-07-09 DIAGNOSIS — I130 Hypertensive heart and chronic kidney disease with heart failure and stage 1 through stage 4 chronic kidney disease, or unspecified chronic kidney disease: Secondary | ICD-10-CM

## 2022-07-09 MED ORDER — EPLERENONE 25 MG PO TABS: 25.0000 mg | ORAL_TABLET | ORAL | 1 refills | Status: DC

## 2022-07-09 MED ORDER — FUROSEMIDE 20 MG PO TABS: 20.0000 mg | ORAL_TABLET | Freq: Every day | ORAL | 1 refills | Status: DC

## 2022-07-09 NOTE — Telephone Encounter (Signed)
-----   Message from Truddie Hidden, RN sent at 06/20/2022  8:01 AM EDT -----  ----- Message ----- From: Richardo Priest, MD Sent: 06/20/2022   7:43 AM EDT To: Rebeca Alert Ash/Hp Triage  Decrease furosemide and inspra to every other day  Recheck BMP 2 weeks

## 2022-07-09 NOTE — Telephone Encounter (Signed)
Patient notified of the following per Dr. Bettina Gavia and agreed with plan. Medication updated and new instruction sent to pharmacy. She is aware of lab request. Order on file

## 2022-07-10 MED ORDER — FUROSEMIDE 20 MG PO TABS: 20.0000 mg | ORAL_TABLET | ORAL | 3 refills | Status: DC

## 2022-07-10 NOTE — Addendum Note (Signed)
Addended by: Precious Gilding on: 07/10/2022 11:17 AM   Modules accepted: Orders

## 2022-07-10 NOTE — Telephone Encounter (Signed)
Patient calling back for clarification on the medication. Please advise

## 2022-07-10 NOTE — Telephone Encounter (Signed)
Called pt advised of MD request to take eplerenone and furosemide every other day.  Spelled out medications for pt to write down.  Pt needs 2 week f/u lab appointment.

## 2022-07-19 NOTE — Progress Notes (Deleted)
Cardiology Office Note:    Date:  07/19/2022   ID:  Kaitlin Villarreal, DOB [birth year], MRN [id number]  PCP:  Angelina Sheriff, MD  Cardiologist:  Shirlee More, MD    Referring MD: Angelina Sheriff, MD    ASSESSMENT:    No diagnosis found. PLAN:    In order of problems listed above:  ***   Next appointment: ***   Medication Adjustments/Labs and Tests Ordered: Current medicines are reviewed at length with the patient today.  Concerns regarding medicines are outlined above.  No orders of the defined types were placed in this encounter.  No orders of the defined types were placed in this encounter.   No chief complaint on file.   History of Present Illness:    Kaitlin Villarreal is a 88 y.o. female with a hx of severe symptomatic aortic stenosis with TAVR June 2019 permanent pacemaker following TAVR for heart block hypertensive heart disease and chronic kidney disease last seen 10/18/2021.  She complained bitterly of foot pain related to gout and gouty arthritis.  She complained of limb pain and abnormal ABI bilaterally and abnormal TBI.  She had an ABI [facility] in July showing mild paravalvular aortic regurgitation VSD severe tricuspid regurgitation and severe pulmonary artery hypertension.  She was last seen 06/19/2022. Compliance with diet, lifestyle and medications: *** Past Medical History:  Diagnosis Date   Acute on chronic diastolic heart failure ([place]) 03/18/2018   Aortic stenosis 10/14/2014   Formatting of this note might be different from the original.  moderate by echo 11/2015   Arthritis    Bilateral renal masses 07/26/2017   Bone spur of toe of left foot    Chronic diastolic (congestive) heart failure (HCC)    Chronic diastolic heart failure ([place]) 03/18/2018   CKD (chronic kidney disease) stage 3, GFR 30-59 ml/min (HCC) 07/26/2017   Essential hypertension    GERD (gastroesophageal reflux disease)    HLD (hyperlipidemia)    Hyperlipidemia 11/25/2016   Pacemaker  09/02/2018   Pancreatic lesion 04/17/2018   Pancreatic mass    a. 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 03/2018: s/p TAVR by Burt Knack and Dr. Roxy Manns   Stage 3 chronic kidney disease ([place])    TIA (transient ischemic attack)     a. 1992   VSD (ventricular septal defect)     Past Surgical History:  Procedure Laterality Date   ABDOMINAL HYSTERECTOMY     APPENDECTOMY     HERNIA REPAIR     INTRAOPERATIVE TRANSTHORACIC ECHOCARDIOGRAM N/A 03/18/2018   Procedure: INTRAOPERATIVE TRANSTHORACIC ECHOCARDIOGRAM;  Surgeon: Sherren Mocha, MD;  Location: [place];  Service: Open Heart Surgery;  Laterality: N/A;   PACEMAKER IMPLANT N/A 03/20/2018   Procedure: PACEMAKER IMPLANT;  Surgeon: Constance Haw, MD;  Location: [place] [facility];  Service: Cardiovascular;  Laterality: N/A;   RIGHT HEART CATH N/A 03/20/2018   Procedure: RIGHT HEART CATH;  Surgeon: Sherren Mocha, MD;  Location: [place] [facility];  Service: Cardiovascular;  Laterality: N/A;   RIGHT/LEFT HEART CATH AND CORONARY ANGIOGRAPHY N/A 02/06/2018   Procedure: RIGHT/LEFT HEART CATH AND CORONARY ANGIOGRAPHY;  Surgeon: Sherren Mocha, MD;  Location: [place] [facility];  Service:  Cardiovascular;  Laterality: N/A;   TONSILLECTOMY     TRANSCATHETER AORTIC VALVE REPLACEMENT, TRANSFEMORAL N/A 03/18/2018   Procedure: TRANSCATHETER AORTIC VALVE REPLACEMENT, TRANSFEMORAL;  Surgeon: Sherren Mocha, MD;  Location: [place];  Service: Open Heart Surgery;  Laterality: N/A;   WISDOM TOOTH EXTRACTION      Current Medications: No outpatient medications have been marked as taking for the 07/20/22 encounter (Appointment) with Kaitlin Priest, MD.     Allergies:   Clarithromycin, Latex, Levofloxacin,  Azithromycin, Diphenhydramine hcl, and Ivp dye [iodinated contrast media]   Social History   Socioeconomic History   Marital status: Married    Spouse name: Not on file   Number of children: Not on file   Years of education: Not on file   Highest education level: Not on file  Occupational History   Not on file  Tobacco Use   Smoking status: Never    Passive exposure: Never   Smokeless tobacco: Never  Vaping Use   Vaping Use: Never used  Substance and Sexual Activity   Alcohol use: No   Drug use: No   Sexual activity: Not on file  Other Topics Concern   Not on file  Social History Narrative   Not on file   Social Determinants of Health   Financial Resource Strain: Not on file  Food Insecurity: Not on file  Transportation Needs: Not on file  Physical Activity: Not on file  Stress: Not on file  Social Connections: Not on file     Family History: The patient's ***family history includes Congenital heart disease in her brother; Heart attack in her brother; Heart disease in her mother; Uterine cancer in her sister. ROS:   Please see the history of present illness.    All other systems reviewed and are negative.  EKGs/Labs/Other Studies Reviewed:    The following studies were reviewed today:  EKG:  EKG ordered today and personally reviewed.  The ekg ordered today demonstrates ***  Recent Labs: 11/07/2021: Hemoglobin 12.2; Platelets 281 06/19/2022: BUN 41; Creatinine, Ser 1.95; Potassium 5.5; Sodium 142  Recent Lipid Panel    Component Value Date/Time   CHOL 217 (H) 04/10/2021 1357   TRIG 180 (H) 04/10/2021 1357   HDL 78 04/10/2021 1357   CHOLHDL 2.8 04/10/2021 1357   LDLCALC 108 (H) 04/10/2021 1357    Physical Exam:    VS:  There were no vitals taken for this visit.    Wt Readings from Last 3 Encounters:  06/19/22 105 lb 3.2 oz (47.7 kg)  01/08/22 106 lb 3.2 oz (48.2 kg)  10/27/21 107 lb (48.5 kg)     GEN: *** Well nourished, well developed in no acute  distress HEENT: Normal NECK: No JVD; No carotid bruits LYMPHATICS: No lymphadenopathy CARDIAC: ***RRR, no murmurs, rubs, gallops RESPIRATORY:  Clear to auscultation without rales, wheezing or rhonchi  ABDOMEN: Soft, non-tender, non-distended MUSCULOSKELETAL:  No edema; No deformity  SKIN: Warm and dry NEUROLOGIC:  Alert and oriented x 3 PSYCHIATRIC:  Normal affect    Signed, Shirlee More, MD  07/19/2022 5:31 PM    [facility]

## 2022-07-20 ENCOUNTER — Ambulatory Visit: Payer: Medicare Other | Admitting: Cardiology

## 2022-07-20 DIAGNOSIS — N183 Chronic kidney disease, stage 3 unspecified: Secondary | ICD-10-CM | POA: Diagnosis not present

## 2022-07-20 DIAGNOSIS — I130 Hypertensive heart and chronic kidney disease with heart failure and stage 1 through stage 4 chronic kidney disease, or unspecified chronic kidney disease: Secondary | ICD-10-CM | POA: Diagnosis not present

## 2022-07-21 LAB — BASIC METABOLIC PANEL
BUN/Creatinine Ratio: 23 (ref 12–28)
BUN: 42 mg/dL — ABNORMAL HIGH (ref 10–36)
CO2: 23 mmol/L (ref 20–29)
Calcium: 9.4 mg/dL (ref 8.7–10.3)
Chloride: 103 mmol/L (ref 96–106)
Creatinine, Ser: 1.82 mg/dL — ABNORMAL HIGH (ref 0.57–1.00)
Glucose: 98 mg/dL (ref 70–99)
Potassium: 4.9 mmol/L (ref 3.5–5.2)
Sodium: 140 mmol/L (ref 134–144)
eGFR: 25 mL/min/{1.73_m2} — ABNORMAL LOW (ref >59)

## 2022-07-23 ENCOUNTER — Other Ambulatory Visit: Payer: Self-pay | Admitting: Cardiology

## 2022-07-26 DIAGNOSIS — I10 Essential (primary) hypertension: Secondary | ICD-10-CM | POA: Diagnosis not present

## 2022-07-26 DIAGNOSIS — R531 Weakness: Secondary | ICD-10-CM | POA: Diagnosis not present

## 2022-07-27 DIAGNOSIS — Z952 Presence of prosthetic heart valve: Secondary | ICD-10-CM | POA: Diagnosis not present

## 2022-07-27 DIAGNOSIS — N183 Chronic kidney disease, stage 3 unspecified: Secondary | ICD-10-CM | POA: Diagnosis not present

## 2022-07-27 DIAGNOSIS — Z91040 Latex allergy status: Secondary | ICD-10-CM | POA: Diagnosis not present

## 2022-07-27 DIAGNOSIS — R197 Diarrhea, unspecified: Secondary | ICD-10-CM | POA: Diagnosis not present

## 2022-07-27 DIAGNOSIS — Z23 Encounter for immunization: Secondary | ICD-10-CM | POA: Diagnosis not present

## 2022-07-27 DIAGNOSIS — Z7982 Long term (current) use of aspirin: Secondary | ICD-10-CM | POA: Diagnosis not present

## 2022-07-27 DIAGNOSIS — R531 Weakness: Secondary | ICD-10-CM | POA: Diagnosis not present

## 2022-07-27 DIAGNOSIS — Z1152 Encounter for screening for COVID-19: Secondary | ICD-10-CM | POA: Diagnosis not present

## 2022-07-27 DIAGNOSIS — M109 Gout, unspecified: Secondary | ICD-10-CM | POA: Diagnosis not present

## 2022-07-27 DIAGNOSIS — E7800 Pure hypercholesterolemia, unspecified: Secondary | ICD-10-CM | POA: Diagnosis not present

## 2022-07-27 DIAGNOSIS — I130 Hypertensive heart and chronic kidney disease with heart failure and stage 1 through stage 4 chronic kidney disease, or unspecified chronic kidney disease: Secondary | ICD-10-CM | POA: Diagnosis not present

## 2022-07-27 DIAGNOSIS — N179 Acute kidney failure, unspecified: Secondary | ICD-10-CM | POA: Diagnosis not present

## 2022-07-27 DIAGNOSIS — Z79899 Other long term (current) drug therapy: Secondary | ICD-10-CM | POA: Diagnosis not present

## 2022-07-27 DIAGNOSIS — Z881 Allergy status to other antibiotic agents status: Secondary | ICD-10-CM | POA: Diagnosis not present

## 2022-07-27 DIAGNOSIS — N281 Cyst of kidney, acquired: Secondary | ICD-10-CM | POA: Diagnosis not present

## 2022-07-27 DIAGNOSIS — M1990 Unspecified osteoarthritis, unspecified site: Secondary | ICD-10-CM | POA: Diagnosis not present

## 2022-07-27 DIAGNOSIS — I509 Heart failure, unspecified: Secondary | ICD-10-CM | POA: Diagnosis not present

## 2022-07-27 DIAGNOSIS — Z950 Presence of cardiac pacemaker: Secondary | ICD-10-CM | POA: Diagnosis not present

## 2022-07-27 DIAGNOSIS — D649 Anemia, unspecified: Secondary | ICD-10-CM | POA: Diagnosis not present

## 2022-08-01 ENCOUNTER — Telehealth: Payer: Self-pay | Admitting: Cardiology

## 2022-08-01 DIAGNOSIS — N179 Acute kidney failure, unspecified: Secondary | ICD-10-CM | POA: Diagnosis not present

## 2022-08-01 DIAGNOSIS — D631 Anemia in chronic kidney disease: Secondary | ICD-10-CM | POA: Diagnosis not present

## 2022-08-01 DIAGNOSIS — D72829 Elevated white blood cell count, unspecified: Secondary | ICD-10-CM | POA: Diagnosis not present

## 2022-08-01 DIAGNOSIS — E7800 Pure hypercholesterolemia, unspecified: Secondary | ICD-10-CM | POA: Diagnosis not present

## 2022-08-01 DIAGNOSIS — Z556 Problems related to health literacy: Secondary | ICD-10-CM | POA: Diagnosis not present

## 2022-08-01 DIAGNOSIS — Z950 Presence of cardiac pacemaker: Secondary | ICD-10-CM | POA: Diagnosis not present

## 2022-08-01 DIAGNOSIS — Z9181 History of falling: Secondary | ICD-10-CM | POA: Diagnosis not present

## 2022-08-01 DIAGNOSIS — M109 Gout, unspecified: Secondary | ICD-10-CM | POA: Diagnosis not present

## 2022-08-01 DIAGNOSIS — M1990 Unspecified osteoarthritis, unspecified site: Secondary | ICD-10-CM | POA: Diagnosis not present

## 2022-08-01 DIAGNOSIS — N183 Chronic kidney disease, stage 3 unspecified: Secondary | ICD-10-CM | POA: Diagnosis not present

## 2022-08-01 DIAGNOSIS — I252 Old myocardial infarction: Secondary | ICD-10-CM | POA: Diagnosis not present

## 2022-08-01 DIAGNOSIS — Z7982 Long term (current) use of aspirin: Secondary | ICD-10-CM | POA: Diagnosis not present

## 2022-08-01 DIAGNOSIS — Z952 Presence of prosthetic heart valve: Secondary | ICD-10-CM | POA: Diagnosis not present

## 2022-08-01 DIAGNOSIS — I509 Heart failure, unspecified: Secondary | ICD-10-CM | POA: Diagnosis not present

## 2022-08-01 DIAGNOSIS — I130 Hypertensive heart and chronic kidney disease with heart failure and stage 1 through stage 4 chronic kidney disease, or unspecified chronic kidney disease: Secondary | ICD-10-CM | POA: Diagnosis not present

## 2022-08-01 DIAGNOSIS — I2510 Atherosclerotic heart disease of native coronary artery without angina pectoris: Secondary | ICD-10-CM | POA: Diagnosis not present

## 2022-08-01 NOTE — Telephone Encounter (Signed)
Recommendations reviewed with Maudie Mercury as per Dr. Joya Gaskins note.  Kim verbalized understanding and had no additional questions.

## 2022-08-01 NOTE — Telephone Encounter (Signed)
Pt's PCP office would like a callback to discuss pt's BP as well as medications. Please advise

## 2022-08-01 NOTE — Telephone Encounter (Signed)
Spoke with Maudie Mercury [facility]) who goes out to the home for [facility]. Maudie Mercury states that the pt's BP yesterday was 205/82 unsure if that reading was before or after her medication. Kim rechecked it afternoon and it was 174/69 and at the end of the visit BP was 169/66. Please advise

## 2022-08-03 DIAGNOSIS — D649 Anemia, unspecified: Secondary | ICD-10-CM | POA: Diagnosis not present

## 2022-08-03 DIAGNOSIS — Z6822 Body mass index (BMI) 22.0-22.9, adult: Secondary | ICD-10-CM | POA: Diagnosis not present

## 2022-08-03 DIAGNOSIS — D510 Vitamin B12 deficiency anemia due to intrinsic factor deficiency: Secondary | ICD-10-CM | POA: Diagnosis not present

## 2022-08-03 DIAGNOSIS — E559 Vitamin D deficiency, unspecified: Secondary | ICD-10-CM | POA: Diagnosis not present

## 2022-08-03 DIAGNOSIS — N183 Chronic kidney disease, stage 3 unspecified: Secondary | ICD-10-CM | POA: Diagnosis not present

## 2022-08-07 DIAGNOSIS — D631 Anemia in chronic kidney disease: Secondary | ICD-10-CM | POA: Diagnosis not present

## 2022-08-07 DIAGNOSIS — N179 Acute kidney failure, unspecified: Secondary | ICD-10-CM | POA: Diagnosis not present

## 2022-08-07 DIAGNOSIS — Z952 Presence of prosthetic heart valve: Secondary | ICD-10-CM | POA: Diagnosis not present

## 2022-08-07 DIAGNOSIS — D72829 Elevated white blood cell count, unspecified: Secondary | ICD-10-CM | POA: Diagnosis not present

## 2022-08-07 DIAGNOSIS — I130 Hypertensive heart and chronic kidney disease with heart failure and stage 1 through stage 4 chronic kidney disease, or unspecified chronic kidney disease: Secondary | ICD-10-CM | POA: Diagnosis not present

## 2022-08-07 DIAGNOSIS — Z950 Presence of cardiac pacemaker: Secondary | ICD-10-CM | POA: Diagnosis not present

## 2022-08-09 DIAGNOSIS — N179 Acute kidney failure, unspecified: Secondary | ICD-10-CM | POA: Diagnosis not present

## 2022-08-09 DIAGNOSIS — D631 Anemia in chronic kidney disease: Secondary | ICD-10-CM | POA: Diagnosis not present

## 2022-08-09 DIAGNOSIS — Z950 Presence of cardiac pacemaker: Secondary | ICD-10-CM | POA: Diagnosis not present

## 2022-08-09 DIAGNOSIS — Z952 Presence of prosthetic heart valve: Secondary | ICD-10-CM | POA: Diagnosis not present

## 2022-08-09 DIAGNOSIS — I130 Hypertensive heart and chronic kidney disease with heart failure and stage 1 through stage 4 chronic kidney disease, or unspecified chronic kidney disease: Secondary | ICD-10-CM | POA: Diagnosis not present

## 2022-08-09 DIAGNOSIS — D72829 Elevated white blood cell count, unspecified: Secondary | ICD-10-CM | POA: Diagnosis not present

## 2022-08-14 DIAGNOSIS — N179 Acute kidney failure, unspecified: Secondary | ICD-10-CM | POA: Diagnosis not present

## 2022-08-14 DIAGNOSIS — Z950 Presence of cardiac pacemaker: Secondary | ICD-10-CM | POA: Diagnosis not present

## 2022-08-14 DIAGNOSIS — D72829 Elevated white blood cell count, unspecified: Secondary | ICD-10-CM | POA: Diagnosis not present

## 2022-08-14 DIAGNOSIS — I130 Hypertensive heart and chronic kidney disease with heart failure and stage 1 through stage 4 chronic kidney disease, or unspecified chronic kidney disease: Secondary | ICD-10-CM | POA: Diagnosis not present

## 2022-08-14 DIAGNOSIS — Z952 Presence of prosthetic heart valve: Secondary | ICD-10-CM | POA: Diagnosis not present

## 2022-08-14 DIAGNOSIS — D631 Anemia in chronic kidney disease: Secondary | ICD-10-CM | POA: Diagnosis not present

## 2022-08-17 DIAGNOSIS — Z950 Presence of cardiac pacemaker: Secondary | ICD-10-CM | POA: Diagnosis not present

## 2022-08-17 DIAGNOSIS — D631 Anemia in chronic kidney disease: Secondary | ICD-10-CM | POA: Diagnosis not present

## 2022-08-17 DIAGNOSIS — I130 Hypertensive heart and chronic kidney disease with heart failure and stage 1 through stage 4 chronic kidney disease, or unspecified chronic kidney disease: Secondary | ICD-10-CM | POA: Diagnosis not present

## 2022-08-17 DIAGNOSIS — Z952 Presence of prosthetic heart valve: Secondary | ICD-10-CM | POA: Diagnosis not present

## 2022-08-17 DIAGNOSIS — D72829 Elevated white blood cell count, unspecified: Secondary | ICD-10-CM | POA: Diagnosis not present

## 2022-08-17 DIAGNOSIS — N179 Acute kidney failure, unspecified: Secondary | ICD-10-CM | POA: Diagnosis not present

## 2022-08-20 DIAGNOSIS — I350 Nonrheumatic aortic (valve) stenosis: Secondary | ICD-10-CM | POA: Diagnosis not present

## 2022-08-20 DIAGNOSIS — I10 Essential (primary) hypertension: Secondary | ICD-10-CM | POA: Diagnosis not present

## 2022-08-20 DIAGNOSIS — Z952 Presence of prosthetic heart valve: Secondary | ICD-10-CM | POA: Diagnosis not present

## 2022-08-20 DIAGNOSIS — R0689 Other abnormalities of breathing: Secondary | ICD-10-CM | POA: Diagnosis not present

## 2022-08-20 DIAGNOSIS — Z79899 Other long term (current) drug therapy: Secondary | ICD-10-CM | POA: Diagnosis not present

## 2022-08-20 DIAGNOSIS — R9431 Abnormal electrocardiogram [ECG] [EKG]: Secondary | ICD-10-CM | POA: Diagnosis not present

## 2022-08-20 DIAGNOSIS — E7800 Pure hypercholesterolemia, unspecified: Secondary | ICD-10-CM | POA: Diagnosis not present

## 2022-08-20 DIAGNOSIS — I130 Hypertensive heart and chronic kidney disease with heart failure and stage 1 through stage 4 chronic kidney disease, or unspecified chronic kidney disease: Secondary | ICD-10-CM | POA: Diagnosis not present

## 2022-08-20 DIAGNOSIS — M109 Gout, unspecified: Secondary | ICD-10-CM | POA: Diagnosis not present

## 2022-08-20 DIAGNOSIS — I509 Heart failure, unspecified: Secondary | ICD-10-CM | POA: Diagnosis not present

## 2022-08-20 DIAGNOSIS — R450 Nervousness: Secondary | ICD-10-CM | POA: Diagnosis not present

## 2022-08-20 DIAGNOSIS — Z950 Presence of cardiac pacemaker: Secondary | ICD-10-CM | POA: Diagnosis not present

## 2022-08-20 DIAGNOSIS — R531 Weakness: Secondary | ICD-10-CM | POA: Diagnosis not present

## 2022-08-20 DIAGNOSIS — I161 Hypertensive emergency: Secondary | ICD-10-CM | POA: Diagnosis not present

## 2022-08-20 DIAGNOSIS — M1990 Unspecified osteoarthritis, unspecified site: Secondary | ICD-10-CM | POA: Diagnosis not present

## 2022-08-20 DIAGNOSIS — Z7982 Long term (current) use of aspirin: Secondary | ICD-10-CM | POA: Diagnosis not present

## 2022-08-20 DIAGNOSIS — Z91040 Latex allergy status: Secondary | ICD-10-CM | POA: Diagnosis not present

## 2022-08-20 DIAGNOSIS — N183 Chronic kidney disease, stage 3 unspecified: Secondary | ICD-10-CM | POA: Diagnosis not present

## 2022-08-20 DIAGNOSIS — Z881 Allergy status to other antibiotic agents status: Secondary | ICD-10-CM | POA: Diagnosis not present

## 2022-08-20 DIAGNOSIS — I5033 Acute on chronic diastolic (congestive) heart failure: Secondary | ICD-10-CM | POA: Diagnosis not present

## 2022-08-20 DIAGNOSIS — R0609 Other forms of dyspnea: Secondary | ICD-10-CM | POA: Diagnosis not present

## 2022-08-24 DIAGNOSIS — Z950 Presence of cardiac pacemaker: Secondary | ICD-10-CM | POA: Diagnosis not present

## 2022-08-24 DIAGNOSIS — N179 Acute kidney failure, unspecified: Secondary | ICD-10-CM | POA: Diagnosis not present

## 2022-08-24 DIAGNOSIS — D72829 Elevated white blood cell count, unspecified: Secondary | ICD-10-CM | POA: Diagnosis not present

## 2022-08-24 DIAGNOSIS — D631 Anemia in chronic kidney disease: Secondary | ICD-10-CM | POA: Diagnosis not present

## 2022-08-24 DIAGNOSIS — Z952 Presence of prosthetic heart valve: Secondary | ICD-10-CM | POA: Diagnosis not present

## 2022-08-24 DIAGNOSIS — I130 Hypertensive heart and chronic kidney disease with heart failure and stage 1 through stage 4 chronic kidney disease, or unspecified chronic kidney disease: Secondary | ICD-10-CM | POA: Diagnosis not present

## 2022-08-27 DIAGNOSIS — Z952 Presence of prosthetic heart valve: Secondary | ICD-10-CM | POA: Diagnosis not present

## 2022-08-27 DIAGNOSIS — D72829 Elevated white blood cell count, unspecified: Secondary | ICD-10-CM | POA: Diagnosis not present

## 2022-08-27 DIAGNOSIS — Z950 Presence of cardiac pacemaker: Secondary | ICD-10-CM | POA: Diagnosis not present

## 2022-08-27 DIAGNOSIS — N179 Acute kidney failure, unspecified: Secondary | ICD-10-CM | POA: Diagnosis not present

## 2022-08-27 DIAGNOSIS — D631 Anemia in chronic kidney disease: Secondary | ICD-10-CM | POA: Diagnosis not present

## 2022-08-27 DIAGNOSIS — I130 Hypertensive heart and chronic kidney disease with heart failure and stage 1 through stage 4 chronic kidney disease, or unspecified chronic kidney disease: Secondary | ICD-10-CM | POA: Diagnosis not present

## 2022-08-28 DIAGNOSIS — I509 Heart failure, unspecified: Secondary | ICD-10-CM | POA: Diagnosis not present

## 2022-08-28 DIAGNOSIS — Z6820 Body mass index (BMI) 20.0-20.9, adult: Secondary | ICD-10-CM | POA: Diagnosis not present

## 2022-08-28 DIAGNOSIS — I499 Cardiac arrhythmia, unspecified: Secondary | ICD-10-CM | POA: Diagnosis not present

## 2022-08-28 DIAGNOSIS — I10 Essential (primary) hypertension: Secondary | ICD-10-CM | POA: Diagnosis not present

## 2022-08-29 DIAGNOSIS — Z950 Presence of cardiac pacemaker: Secondary | ICD-10-CM | POA: Diagnosis not present

## 2022-08-29 DIAGNOSIS — Z952 Presence of prosthetic heart valve: Secondary | ICD-10-CM | POA: Diagnosis not present

## 2022-08-29 DIAGNOSIS — I130 Hypertensive heart and chronic kidney disease with heart failure and stage 1 through stage 4 chronic kidney disease, or unspecified chronic kidney disease: Secondary | ICD-10-CM | POA: Diagnosis not present

## 2022-08-29 DIAGNOSIS — N179 Acute kidney failure, unspecified: Secondary | ICD-10-CM | POA: Diagnosis not present

## 2022-08-29 DIAGNOSIS — D72829 Elevated white blood cell count, unspecified: Secondary | ICD-10-CM | POA: Diagnosis not present

## 2022-08-29 DIAGNOSIS — D631 Anemia in chronic kidney disease: Secondary | ICD-10-CM | POA: Diagnosis not present

## 2022-08-30 DIAGNOSIS — Z950 Presence of cardiac pacemaker: Secondary | ICD-10-CM | POA: Diagnosis not present

## 2022-08-30 DIAGNOSIS — D631 Anemia in chronic kidney disease: Secondary | ICD-10-CM | POA: Diagnosis not present

## 2022-08-30 DIAGNOSIS — N179 Acute kidney failure, unspecified: Secondary | ICD-10-CM | POA: Diagnosis not present

## 2022-08-30 DIAGNOSIS — D72829 Elevated white blood cell count, unspecified: Secondary | ICD-10-CM | POA: Diagnosis not present

## 2022-08-30 DIAGNOSIS — Z952 Presence of prosthetic heart valve: Secondary | ICD-10-CM | POA: Diagnosis not present

## 2022-08-30 DIAGNOSIS — I130 Hypertensive heart and chronic kidney disease with heart failure and stage 1 through stage 4 chronic kidney disease, or unspecified chronic kidney disease: Secondary | ICD-10-CM | POA: Diagnosis not present

## 2022-08-31 DIAGNOSIS — Z950 Presence of cardiac pacemaker: Secondary | ICD-10-CM | POA: Diagnosis not present

## 2022-08-31 DIAGNOSIS — Z952 Presence of prosthetic heart valve: Secondary | ICD-10-CM | POA: Diagnosis not present

## 2022-08-31 DIAGNOSIS — E7800 Pure hypercholesterolemia, unspecified: Secondary | ICD-10-CM | POA: Diagnosis not present

## 2022-08-31 DIAGNOSIS — M109 Gout, unspecified: Secondary | ICD-10-CM | POA: Diagnosis not present

## 2022-08-31 DIAGNOSIS — N183 Chronic kidney disease, stage 3 unspecified: Secondary | ICD-10-CM | POA: Diagnosis not present

## 2022-08-31 DIAGNOSIS — D72829 Elevated white blood cell count, unspecified: Secondary | ICD-10-CM | POA: Diagnosis not present

## 2022-08-31 DIAGNOSIS — I160 Hypertensive urgency: Secondary | ICD-10-CM | POA: Diagnosis not present

## 2022-08-31 DIAGNOSIS — D631 Anemia in chronic kidney disease: Secondary | ICD-10-CM | POA: Diagnosis not present

## 2022-08-31 DIAGNOSIS — I252 Old myocardial infarction: Secondary | ICD-10-CM | POA: Diagnosis not present

## 2022-08-31 DIAGNOSIS — Z792 Long term (current) use of antibiotics: Secondary | ICD-10-CM | POA: Diagnosis not present

## 2022-08-31 DIAGNOSIS — I509 Heart failure, unspecified: Secondary | ICD-10-CM | POA: Diagnosis not present

## 2022-08-31 DIAGNOSIS — I130 Hypertensive heart and chronic kidney disease with heart failure and stage 1 through stage 4 chronic kidney disease, or unspecified chronic kidney disease: Secondary | ICD-10-CM | POA: Diagnosis not present

## 2022-08-31 DIAGNOSIS — I2510 Atherosclerotic heart disease of native coronary artery without angina pectoris: Secondary | ICD-10-CM | POA: Diagnosis not present

## 2022-08-31 DIAGNOSIS — M1990 Unspecified osteoarthritis, unspecified site: Secondary | ICD-10-CM | POA: Diagnosis not present

## 2022-08-31 DIAGNOSIS — Z9181 History of falling: Secondary | ICD-10-CM | POA: Diagnosis not present

## 2022-08-31 DIAGNOSIS — N179 Acute kidney failure, unspecified: Secondary | ICD-10-CM | POA: Diagnosis not present

## 2022-08-31 DIAGNOSIS — Z556 Problems related to health literacy: Secondary | ICD-10-CM | POA: Diagnosis not present

## 2022-08-31 DIAGNOSIS — Z7982 Long term (current) use of aspirin: Secondary | ICD-10-CM | POA: Diagnosis not present

## 2022-09-03 DIAGNOSIS — Z950 Presence of cardiac pacemaker: Secondary | ICD-10-CM | POA: Diagnosis not present

## 2022-09-03 DIAGNOSIS — D631 Anemia in chronic kidney disease: Secondary | ICD-10-CM | POA: Diagnosis not present

## 2022-09-03 DIAGNOSIS — N179 Acute kidney failure, unspecified: Secondary | ICD-10-CM | POA: Diagnosis not present

## 2022-09-03 DIAGNOSIS — I160 Hypertensive urgency: Secondary | ICD-10-CM | POA: Diagnosis not present

## 2022-09-03 DIAGNOSIS — Z952 Presence of prosthetic heart valve: Secondary | ICD-10-CM | POA: Diagnosis not present

## 2022-09-03 DIAGNOSIS — D72829 Elevated white blood cell count, unspecified: Secondary | ICD-10-CM | POA: Diagnosis not present

## 2022-09-05 DIAGNOSIS — Z952 Presence of prosthetic heart valve: Secondary | ICD-10-CM | POA: Diagnosis not present

## 2022-09-05 DIAGNOSIS — D631 Anemia in chronic kidney disease: Secondary | ICD-10-CM | POA: Diagnosis not present

## 2022-09-05 DIAGNOSIS — N179 Acute kidney failure, unspecified: Secondary | ICD-10-CM | POA: Diagnosis not present

## 2022-09-05 DIAGNOSIS — I160 Hypertensive urgency: Secondary | ICD-10-CM | POA: Diagnosis not present

## 2022-09-05 DIAGNOSIS — Z950 Presence of cardiac pacemaker: Secondary | ICD-10-CM | POA: Diagnosis not present

## 2022-09-05 DIAGNOSIS — D72829 Elevated white blood cell count, unspecified: Secondary | ICD-10-CM | POA: Diagnosis not present

## 2022-09-06 DIAGNOSIS — I10 Essential (primary) hypertension: Secondary | ICD-10-CM | POA: Diagnosis not present

## 2022-09-06 DIAGNOSIS — F419 Anxiety disorder, unspecified: Secondary | ICD-10-CM | POA: Diagnosis not present

## 2022-09-06 DIAGNOSIS — I160 Hypertensive urgency: Secondary | ICD-10-CM | POA: Diagnosis not present

## 2022-09-06 DIAGNOSIS — Z792 Long term (current) use of antibiotics: Secondary | ICD-10-CM | POA: Diagnosis not present

## 2022-09-06 DIAGNOSIS — N19 Unspecified kidney failure: Secondary | ICD-10-CM | POA: Diagnosis not present

## 2022-09-06 DIAGNOSIS — Z6820 Body mass index (BMI) 20.0-20.9, adult: Secondary | ICD-10-CM | POA: Diagnosis not present

## 2022-09-12 DIAGNOSIS — I160 Hypertensive urgency: Secondary | ICD-10-CM | POA: Diagnosis not present

## 2022-09-12 DIAGNOSIS — Z952 Presence of prosthetic heart valve: Secondary | ICD-10-CM | POA: Diagnosis not present

## 2022-09-12 DIAGNOSIS — N179 Acute kidney failure, unspecified: Secondary | ICD-10-CM | POA: Diagnosis not present

## 2022-09-12 DIAGNOSIS — D631 Anemia in chronic kidney disease: Secondary | ICD-10-CM | POA: Diagnosis not present

## 2022-09-12 DIAGNOSIS — Z950 Presence of cardiac pacemaker: Secondary | ICD-10-CM | POA: Diagnosis not present

## 2022-09-12 DIAGNOSIS — D72829 Elevated white blood cell count, unspecified: Secondary | ICD-10-CM | POA: Diagnosis not present

## 2022-09-17 DIAGNOSIS — I160 Hypertensive urgency: Secondary | ICD-10-CM | POA: Diagnosis not present

## 2022-09-17 DIAGNOSIS — D631 Anemia in chronic kidney disease: Secondary | ICD-10-CM | POA: Diagnosis not present

## 2022-09-17 DIAGNOSIS — D72829 Elevated white blood cell count, unspecified: Secondary | ICD-10-CM | POA: Diagnosis not present

## 2022-09-17 DIAGNOSIS — Z952 Presence of prosthetic heart valve: Secondary | ICD-10-CM | POA: Diagnosis not present

## 2022-09-17 DIAGNOSIS — Z950 Presence of cardiac pacemaker: Secondary | ICD-10-CM | POA: Diagnosis not present

## 2022-09-17 DIAGNOSIS — N179 Acute kidney failure, unspecified: Secondary | ICD-10-CM | POA: Diagnosis not present

## 2022-09-19 ENCOUNTER — Ambulatory Visit (INDEPENDENT_AMBULATORY_CARE_PROVIDER_SITE_OTHER): Payer: Medicare Other

## 2022-09-19 DIAGNOSIS — I442 Atrioventricular block, complete: Secondary | ICD-10-CM | POA: Diagnosis not present

## 2022-09-19 LAB — CUP PACEART REMOTE DEVICE CHECK
Battery Remaining Longevity: 80 mo
Battery Remaining Percentage: 65 %
Battery Voltage: 3.01 V
Brady Statistic AP VP Percent: 1 %
Brady Statistic AP VS Percent: 12 %
Brady Statistic AS VP Percent: 1 %
Brady Statistic AS VS Percent: 87 %
Brady Statistic RA Percent Paced: 12 %
Brady Statistic RV Percent Paced: 1 %
Date Time Interrogation Session: 20231220020015
Implantable Lead Connection Status: 753985
Implantable Lead Connection Status: 753985
Implantable Lead Implant Date: 20190620
Implantable Lead Implant Date: 20190620
Implantable Lead Location: 753859
Implantable Lead Location: 753860
Implantable Pulse Generator Implant Date: 20190620
Lead Channel Impedance Value: 340 Ohm
Lead Channel Impedance Value: 400 Ohm
Lead Channel Pacing Threshold Amplitude: 0.75 V
Lead Channel Pacing Threshold Amplitude: 0.75 V
Lead Channel Pacing Threshold Pulse Width: 0.5 ms
Lead Channel Pacing Threshold Pulse Width: 0.5 ms
Lead Channel Sensing Intrinsic Amplitude: 3.9 mV
Lead Channel Sensing Intrinsic Amplitude: 7.9 mV
Lead Channel Setting Pacing Amplitude: 1 V
Lead Channel Setting Pacing Amplitude: 2 V
Lead Channel Setting Pacing Pulse Width: 0.5 ms
Lead Channel Setting Sensing Sensitivity: 2 mV
Pulse Gen Model: 2272
Pulse Gen Serial Number: 9035930

## 2022-09-20 DIAGNOSIS — Z952 Presence of prosthetic heart valve: Secondary | ICD-10-CM | POA: Diagnosis not present

## 2022-09-20 DIAGNOSIS — D631 Anemia in chronic kidney disease: Secondary | ICD-10-CM | POA: Diagnosis not present

## 2022-09-20 DIAGNOSIS — N179 Acute kidney failure, unspecified: Secondary | ICD-10-CM | POA: Diagnosis not present

## 2022-09-20 DIAGNOSIS — I160 Hypertensive urgency: Secondary | ICD-10-CM | POA: Diagnosis not present

## 2022-09-20 DIAGNOSIS — D72829 Elevated white blood cell count, unspecified: Secondary | ICD-10-CM | POA: Diagnosis not present

## 2022-09-20 DIAGNOSIS — Z950 Presence of cardiac pacemaker: Secondary | ICD-10-CM | POA: Diagnosis not present

## 2022-09-25 ENCOUNTER — Other Ambulatory Visit: Payer: Self-pay | Admitting: Cardiology

## 2022-09-26 DIAGNOSIS — D72829 Elevated white blood cell count, unspecified: Secondary | ICD-10-CM | POA: Diagnosis not present

## 2022-09-26 DIAGNOSIS — D631 Anemia in chronic kidney disease: Secondary | ICD-10-CM | POA: Diagnosis not present

## 2022-09-26 DIAGNOSIS — N179 Acute kidney failure, unspecified: Secondary | ICD-10-CM | POA: Diagnosis not present

## 2022-09-26 DIAGNOSIS — Z952 Presence of prosthetic heart valve: Secondary | ICD-10-CM | POA: Diagnosis not present

## 2022-09-26 DIAGNOSIS — I160 Hypertensive urgency: Secondary | ICD-10-CM | POA: Diagnosis not present

## 2022-09-26 DIAGNOSIS — Z950 Presence of cardiac pacemaker: Secondary | ICD-10-CM | POA: Diagnosis not present

## 2022-09-27 DIAGNOSIS — D631 Anemia in chronic kidney disease: Secondary | ICD-10-CM | POA: Diagnosis not present

## 2022-09-27 DIAGNOSIS — N179 Acute kidney failure, unspecified: Secondary | ICD-10-CM | POA: Diagnosis not present

## 2022-09-27 DIAGNOSIS — D72829 Elevated white blood cell count, unspecified: Secondary | ICD-10-CM | POA: Diagnosis not present

## 2022-09-27 DIAGNOSIS — Z952 Presence of prosthetic heart valve: Secondary | ICD-10-CM | POA: Diagnosis not present

## 2022-09-27 DIAGNOSIS — I160 Hypertensive urgency: Secondary | ICD-10-CM | POA: Diagnosis not present

## 2022-09-27 DIAGNOSIS — Z950 Presence of cardiac pacemaker: Secondary | ICD-10-CM | POA: Diagnosis not present

## 2022-09-30 DIAGNOSIS — N179 Acute kidney failure, unspecified: Secondary | ICD-10-CM | POA: Diagnosis not present

## 2022-09-30 DIAGNOSIS — N189 Chronic kidney disease, unspecified: Secondary | ICD-10-CM | POA: Diagnosis not present

## 2022-09-30 DIAGNOSIS — I10 Essential (primary) hypertension: Secondary | ICD-10-CM | POA: Diagnosis not present

## 2022-09-30 DIAGNOSIS — Z950 Presence of cardiac pacemaker: Secondary | ICD-10-CM | POA: Diagnosis not present

## 2022-09-30 DIAGNOSIS — Z792 Long term (current) use of antibiotics: Secondary | ICD-10-CM | POA: Diagnosis not present

## 2022-09-30 DIAGNOSIS — I509 Heart failure, unspecified: Secondary | ICD-10-CM | POA: Diagnosis not present

## 2022-09-30 DIAGNOSIS — I160 Hypertensive urgency: Secondary | ICD-10-CM | POA: Diagnosis not present

## 2022-09-30 DIAGNOSIS — M1990 Unspecified osteoarthritis, unspecified site: Secondary | ICD-10-CM | POA: Diagnosis not present

## 2022-09-30 DIAGNOSIS — Z7982 Long term (current) use of aspirin: Secondary | ICD-10-CM | POA: Diagnosis not present

## 2022-09-30 DIAGNOSIS — Z556 Problems related to health literacy: Secondary | ICD-10-CM | POA: Diagnosis not present

## 2022-09-30 DIAGNOSIS — I252 Old myocardial infarction: Secondary | ICD-10-CM | POA: Diagnosis not present

## 2022-09-30 DIAGNOSIS — Z952 Presence of prosthetic heart valve: Secondary | ICD-10-CM | POA: Diagnosis not present

## 2022-09-30 DIAGNOSIS — E7800 Pure hypercholesterolemia, unspecified: Secondary | ICD-10-CM | POA: Diagnosis not present

## 2022-09-30 DIAGNOSIS — I2510 Atherosclerotic heart disease of native coronary artery without angina pectoris: Secondary | ICD-10-CM | POA: Diagnosis not present

## 2022-09-30 DIAGNOSIS — I130 Hypertensive heart and chronic kidney disease with heart failure and stage 1 through stage 4 chronic kidney disease, or unspecified chronic kidney disease: Secondary | ICD-10-CM | POA: Diagnosis not present

## 2022-09-30 DIAGNOSIS — N183 Chronic kidney disease, stage 3 unspecified: Secondary | ICD-10-CM | POA: Diagnosis not present

## 2022-09-30 DIAGNOSIS — M109 Gout, unspecified: Secondary | ICD-10-CM | POA: Diagnosis not present

## 2022-09-30 DIAGNOSIS — D631 Anemia in chronic kidney disease: Secondary | ICD-10-CM | POA: Diagnosis not present

## 2022-09-30 DIAGNOSIS — D72829 Elevated white blood cell count, unspecified: Secondary | ICD-10-CM | POA: Diagnosis not present

## 2022-09-30 DIAGNOSIS — Z9181 History of falling: Secondary | ICD-10-CM | POA: Diagnosis not present

## 2022-10-03 DIAGNOSIS — N1831 Chronic kidney disease, stage 3a: Secondary | ICD-10-CM | POA: Diagnosis not present

## 2022-10-03 DIAGNOSIS — M109 Gout, unspecified: Secondary | ICD-10-CM | POA: Diagnosis not present

## 2022-10-03 DIAGNOSIS — Z79899 Other long term (current) drug therapy: Secondary | ICD-10-CM | POA: Diagnosis not present

## 2022-10-03 DIAGNOSIS — M79673 Pain in unspecified foot: Secondary | ICD-10-CM | POA: Diagnosis not present

## 2022-10-03 DIAGNOSIS — M1990 Unspecified osteoarthritis, unspecified site: Secondary | ICD-10-CM | POA: Diagnosis not present

## 2022-10-03 DIAGNOSIS — M2550 Pain in unspecified joint: Secondary | ICD-10-CM | POA: Diagnosis not present

## 2022-10-04 DIAGNOSIS — I160 Hypertensive urgency: Secondary | ICD-10-CM | POA: Diagnosis not present

## 2022-10-04 DIAGNOSIS — D631 Anemia in chronic kidney disease: Secondary | ICD-10-CM | POA: Diagnosis not present

## 2022-10-04 DIAGNOSIS — Z950 Presence of cardiac pacemaker: Secondary | ICD-10-CM | POA: Diagnosis not present

## 2022-10-04 DIAGNOSIS — D72829 Elevated white blood cell count, unspecified: Secondary | ICD-10-CM | POA: Diagnosis not present

## 2022-10-04 DIAGNOSIS — Z952 Presence of prosthetic heart valve: Secondary | ICD-10-CM | POA: Diagnosis not present

## 2022-10-04 DIAGNOSIS — N179 Acute kidney failure, unspecified: Secondary | ICD-10-CM | POA: Diagnosis not present

## 2022-10-15 NOTE — Progress Notes (Signed)
Remote pacemaker transmission.   

## 2022-10-16 DIAGNOSIS — D72829 Elevated white blood cell count, unspecified: Secondary | ICD-10-CM | POA: Diagnosis not present

## 2022-10-16 DIAGNOSIS — D631 Anemia in chronic kidney disease: Secondary | ICD-10-CM | POA: Diagnosis not present

## 2022-10-16 DIAGNOSIS — Z952 Presence of prosthetic heart valve: Secondary | ICD-10-CM | POA: Diagnosis not present

## 2022-10-16 DIAGNOSIS — Z950 Presence of cardiac pacemaker: Secondary | ICD-10-CM | POA: Diagnosis not present

## 2022-10-16 DIAGNOSIS — I160 Hypertensive urgency: Secondary | ICD-10-CM | POA: Diagnosis not present

## 2022-10-16 DIAGNOSIS — N179 Acute kidney failure, unspecified: Secondary | ICD-10-CM | POA: Diagnosis not present

## 2022-10-17 ENCOUNTER — Other Ambulatory Visit (HOSPITAL_COMMUNITY): Payer: Self-pay

## 2022-10-19 DIAGNOSIS — H04123 Dry eye syndrome of bilateral lacrimal glands: Secondary | ICD-10-CM | POA: Diagnosis not present

## 2022-10-22 DIAGNOSIS — N179 Acute kidney failure, unspecified: Secondary | ICD-10-CM | POA: Diagnosis not present

## 2022-10-22 DIAGNOSIS — I160 Hypertensive urgency: Secondary | ICD-10-CM | POA: Diagnosis not present

## 2022-10-22 DIAGNOSIS — D72829 Elevated white blood cell count, unspecified: Secondary | ICD-10-CM | POA: Diagnosis not present

## 2022-10-22 DIAGNOSIS — D631 Anemia in chronic kidney disease: Secondary | ICD-10-CM | POA: Diagnosis not present

## 2022-10-22 DIAGNOSIS — Z950 Presence of cardiac pacemaker: Secondary | ICD-10-CM | POA: Diagnosis not present

## 2022-10-22 DIAGNOSIS — Z952 Presence of prosthetic heart valve: Secondary | ICD-10-CM | POA: Diagnosis not present

## 2022-10-24 DIAGNOSIS — I10 Essential (primary) hypertension: Secondary | ICD-10-CM | POA: Diagnosis not present

## 2022-10-24 DIAGNOSIS — R531 Weakness: Secondary | ICD-10-CM | POA: Diagnosis not present

## 2022-10-24 DIAGNOSIS — R9431 Abnormal electrocardiogram [ECG] [EKG]: Secondary | ICD-10-CM | POA: Diagnosis not present

## 2022-10-24 DIAGNOSIS — R42 Dizziness and giddiness: Secondary | ICD-10-CM | POA: Diagnosis not present

## 2022-10-24 DIAGNOSIS — I1A0 Resistant hypertension: Secondary | ICD-10-CM | POA: Diagnosis not present

## 2022-10-24 DIAGNOSIS — H538 Other visual disturbances: Secondary | ICD-10-CM | POA: Diagnosis not present

## 2022-10-24 DIAGNOSIS — R519 Headache, unspecified: Secondary | ICD-10-CM | POA: Diagnosis not present

## 2022-10-29 DIAGNOSIS — I10 Essential (primary) hypertension: Secondary | ICD-10-CM | POA: Diagnosis not present

## 2022-10-29 DIAGNOSIS — I350 Nonrheumatic aortic (valve) stenosis: Secondary | ICD-10-CM | POA: Diagnosis not present

## 2022-10-29 DIAGNOSIS — Z6820 Body mass index (BMI) 20.0-20.9, adult: Secondary | ICD-10-CM | POA: Diagnosis not present

## 2022-10-29 DIAGNOSIS — D649 Anemia, unspecified: Secondary | ICD-10-CM | POA: Diagnosis not present

## 2022-10-31 DIAGNOSIS — I509 Heart failure, unspecified: Secondary | ICD-10-CM | POA: Diagnosis not present

## 2022-10-31 DIAGNOSIS — N189 Chronic kidney disease, unspecified: Secondary | ICD-10-CM | POA: Diagnosis not present

## 2022-10-31 DIAGNOSIS — I10 Essential (primary) hypertension: Secondary | ICD-10-CM | POA: Diagnosis not present

## 2022-11-07 ENCOUNTER — Other Ambulatory Visit: Payer: Self-pay | Admitting: Oncology

## 2022-11-07 DIAGNOSIS — D649 Anemia, unspecified: Secondary | ICD-10-CM

## 2022-11-07 NOTE — Progress Notes (Signed)
[facility]  [address] [place],  [place]  [address] [phone]  Clinic Day:  11/08/2022  Referring physician: Angelina Sheriff, MD   HISTORY OF PRESENT ILLNESS:  The patient is a 89 y.o. female  who I was asked to consult upon for anemia.  Labs in late January 2024 showed a low hemoglobin of 9.5.  Of note, her MCV was normal at 96.  According to the patient, she has never been told before of having anemia.  She denies having any overt forms of blood loss to explain her anemia.  She claims her last colonoscopy was done around when she was 89 years old, which came back essentially unremarkable.  She also denies being placed on any new medications, particularly any which are known to cause anemia via bone marrow suppression.  To her knowledge there is no family history of anemia or other hematologic disorders.  The patient has had an aortic valve replacement, but it was not with a mechanical valve.  PAST MEDICAL HISTORY:   Past Medical History:  Diagnosis Date   Acute on chronic diastolic heart failure ([place]) 03/18/2018   Aortic stenosis 10/14/2014   Formatting of this note might be different from the original.  moderate by echo 11/2015   Arthritis    Bilateral renal masses 07/26/2017   Bone spur of toe of left foot    Chronic diastolic (congestive) heart failure (HCC)    Chronic diastolic heart failure ([place]) 03/18/2018   CKD (chronic kidney disease) stage 3, GFR 30-59 ml/min (HCC) 07/26/2017   Essential hypertension    GERD (gastroesophageal reflux disease)    HLD (hyperlipidemia)    Hyperlipidemia 11/25/2016   Pacemaker 09/02/2018   Pancreatic lesion 04/17/2018   Pancreatic mass    a. 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 03/2018: s/p TAVR by Burt Knack and Dr. Roxy Manns   Stage 3 chronic kidney disease ([place])    TIA (transient ischemic attack)     a. 1992   VSD (ventricular septal defect)     PAST SURGICAL HISTORY:   Past Surgical History:  Procedure Laterality Date   ABDOMINAL HYSTERECTOMY     APPENDECTOMY     HERNIA REPAIR     INTRAOPERATIVE TRANSTHORACIC ECHOCARDIOGRAM N/A 03/18/2018   Procedure: INTRAOPERATIVE TRANSTHORACIC ECHOCARDIOGRAM;  Surgeon: Sherren Mocha, MD;  Location: [facility];  Service: Open Heart Surgery;  Laterality: N/A;   PACEMAKER IMPLANT N/A 03/20/2018   Procedure: PACEMAKER IMPLANT;  Surgeon: Constance Haw, MD;  Location: [facility];  Service: Cardiovascular;  Laterality: N/A;   RIGHT HEART CATH N/A 03/20/2018   Procedure: RIGHT HEART CATH;  Surgeon: Sherren Mocha, MD;  Location: [facility];  Service: Cardiovascular;  Laterality: N/A;   RIGHT/LEFT HEART CATH AND CORONARY ANGIOGRAPHY N/A 02/06/2018   Procedure: RIGHT/LEFT HEART CATH AND CORONARY ANGIOGRAPHY;  Surgeon: Sherren Mocha, MD;  Location: [facility];  Service: Cardiovascular;  Laterality: N/A;   TONSILLECTOMY     TRANSCATHETER AORTIC VALVE REPLACEMENT, TRANSFEMORAL N/A 03/18/2018   Procedure: TRANSCATHETER AORTIC VALVE REPLACEMENT, TRANSFEMORAL;  Surgeon: Sherren Mocha, MD;  Location: [facility];  Service: Open Heart Surgery;  Laterality: N/A;   WISDOM  TOOTH EXTRACTION      CURRENT MEDICATIONS:   Current Outpatient Medications  Medication Sig Dispense Refill   acetaminophen (TYLENOL) 650 MG CR tablet Take 650-1,300 mg by mouth every 8 (eight) hours as needed for pain.     Allopurinol 200 MG TABS Take 200 mg by mouth daily.     amLODipine (NORVASC) 2.5 MG tablet Take 2.5 mg by mouth daily.     aspirin EC 81 MG tablet Take 81 mg by mouth daily.      Cyanocobalamin (B-12) 2500 MCG TABS Take 2,500 mcg by mouth daily.      estradiol (ESTRACE) 1 MG tablet Take 0.5 mg by mouth  daily.     fluticasone (FLONASE) 50 MCG/ACT nasal spray Place 2 sprays into both nostrils daily as needed for allergies or rhinitis.      furosemide (LASIX) 20 MG tablet Take 1 tablet (20 mg total) by mouth every other day. 45 tablet 3   hydrALAZINE (APRESOLINE) 25 MG tablet Take 1 tablet (25 mg total) by mouth 3 (three) times daily as needed (when BP is over 180). 270 tablet 3   lisinopril (ZESTRIL) 20 MG tablet Take 1 tablet by mouth once daily 90 tablet 2   meclizine (ANTIVERT) 25 MG tablet Take 25 mg by mouth every 8 (eight) hours as needed for dizziness.     metoprolol tartrate (LOPRESSOR) 50 MG tablet Take 1 tablet by mouth twice daily 180 tablet 0   Multiple Vitamins-Minerals (MULTI-VITAMIN GUMMIES) CHEW Chew 2 tablets by mouth daily.      Omega-3 Fatty Acids (FISH OIL) 1000 MG CAPS Take 1,000 mg by mouth daily.     Vitamin D, Ergocalciferol, (DRISDOL) 1.25 MG (50000 UT) CAPS capsule Take 50,000 Units by mouth See admin instructions. Take 50,000 units by mouth 2 times a week- Mondays and Fridays  1   amoxicillin (AMOXIL) 500 MG tablet Take 4 tablets (2,000 mg total) by mouth as directed. 1 hour prior to dental work including cleanings (Patient not taking: Reported on 11/08/2022) 12 tablet 12   Carboxymethylcellulose Sod PF (THERATEARS PF) 0.25 % SOLN Place 1 drop into both eyes in the morning and at bedtime.     No current facility-administered medications for this visit.    ALLERGIES:   Allergies  Allergen Reactions   Clarithromycin Other (See Comments)    Felt like body was swollen, felt awful"- and, angioedema   Latex Rash and Other (See Comments)    Causes sores and Rash-Generalized   Levofloxacin Other (See Comments)    Hallucinations    Azithromycin Other (See Comments)    Reaction??   Diphenhydramine Hcl     Other reaction(s): Unknown   Ivp Dye [Iodinated Contrast Media]    FAMILY HISTORY:   Family History  Problem Relation Age of Onset   Heart disease Mother     Uterine cancer Sister    Congenital heart disease Brother    Heart attack Brother     SOCIAL HISTORY:  The patient was born and raised in [place].  She lives [place] town with her husband of 62 years.  They have 1 child and 1 grandchild.  She previously worked at a [employer], at [employer], and at a [employer].  There is no history of alcoholism or tobacco abuse.  REVIEW OF SYSTEMS:  Review of Systems  Constitutional:  Positive for fatigue. Negative for fever.  HENT:   Positive for hearing loss. Negative for sore throat.  Eyes:  Negative for eye problems.  Respiratory:  Positive for shortness of breath. Negative for chest tightness, cough and hemoptysis.   Cardiovascular:  Negative for chest pain and palpitations.  Gastrointestinal:  Positive for diarrhea. Negative for abdominal distention, abdominal pain, blood in stool, constipation, nausea and vomiting.  Endocrine: Negative for hot flashes.  Genitourinary:  Positive for frequency. Negative for difficulty urinating, dysuria, hematuria and nocturia.   Musculoskeletal:  Positive for arthralgias. Negative for back pain, gait problem and myalgias.  Skin: Negative.  Negative for itching and rash.  Neurological: Negative.  Negative for dizziness, extremity weakness, gait problem, headaches, light-headedness and numbness.  Hematological:  Bruises/bleeds easily.  Psychiatric/Behavioral: Negative.  Negative for depression and suicidal ideas. The patient is not nervous/anxious.    PHYSICAL EXAM:  Blood pressure (!) 176/74, pulse 63, temperature 97.6 F (36.4 C), temperature source Oral, resp. rate 12, height 5' (1.524 m), weight 104 lb 12.8 oz (47.5 kg), SpO2 95 %. Wt Readings from Last 3 Encounters:  11/08/22 104 lb 12.8 oz (47.5 kg)  06/19/22 105 lb 3.2 oz (47.7 kg)  01/08/22 106 lb 3.2 oz (48.2 kg)   Body mass index is 20.47 kg/m. Performance status (ECOG): 1 - Symptomatic but completely  ambulatory Physical Exam Constitutional:      Appearance: Normal appearance. She is not ill-appearing.  HENT:     Mouth/Throat:     Mouth: Mucous membranes are moist.     Pharynx: Oropharynx is clear. No oropharyngeal exudate or posterior oropharyngeal erythema.  Cardiovascular:     Rate and Rhythm: Normal rate and regular rhythm.     Heart sounds: Murmur heard.     No friction rub. No gallop.  Pulmonary:     Effort: Pulmonary effort is normal. No respiratory distress.     Breath sounds: Normal breath sounds. No wheezing, rhonchi or rales.  Abdominal:     General: Bowel sounds are normal. There is no distension.     Palpations: Abdomen is soft. There is no mass.     Tenderness: There is no abdominal tenderness.  Musculoskeletal:        General: No swelling.     Right lower leg: No edema.     Left lower leg: No edema.  Lymphadenopathy:     Cervical: No cervical adenopathy.     Upper Body:     Right upper body: No supraclavicular or axillary adenopathy.     Left upper body: No supraclavicular or axillary adenopathy.     Lower Body: No right inguinal adenopathy. No left inguinal adenopathy.  Skin:    General: Skin is warm.     Coloration: Skin is not jaundiced.     Findings: No lesion or rash.  Neurological:     General: No focal deficit present.     Mental Status: She is alert and oriented to person, place, and time. Mental status is at baseline.  Psychiatric:        Mood and Affect: Mood normal.        Behavior: Behavior normal.        Thought Content: Thought content normal.    LABS:       Latest Ref Rng & Units 07/20/2022    2:16 PM 06/19/2022    3:00 PM 11/07/2021    2:26 PM  CMP  Glucose 70 - 99 mg/dL 98  84  96   BUN 10 - 36 mg/dL 42  41  42   Creatinine 0.57 - 1.00 mg/dL  1.82  1.95  1.81   Sodium 134 - 144 mmol/L 140  142  142   Potassium 3.5 - 5.2 mmol/L 4.9  5.5  4.6   Chloride 96 - 106 mmol/L 103  101  101   CO2 20 - 29 mmol/L '23  25  29   '$ Calcium 8.7 -  10.3 mg/dL 9.4  9.8  9.6    ASSESSMENT & PLAN:  A 89 y.o. female who I was asked to consult upon for anemia.  When evaluating her history, this patient does have known chronic renal insufficiency, which could be the major reason behind her anemia.  For completeness, I will check her iron, vitamin B12, and folate levels to ensure there are no nutritional deficiencies factoring into her anemia.  I will also check a serum protein electrophoresis to ensure an underlying plasma cell dyscrasia, such as multiple myeloma, is not behind her anemia.  The only other diagnosis that I would be worried about is underlying myelodysplasia.  Ultimately, a bone marrow biopsy would be needed to prove this diagnosis.  I will see this patient back in 1 week to go over all of her labs collected today, which will be used to formulate her next course of action as it pertains to her anemia management.  The patient understands all the plans discussed today and is in agreement with them.  I do appreciate Angelina Sheriff, MD for his new consult.   Kiante Ciavarella Macarthur Critchley, MD

## 2022-11-08 ENCOUNTER — Inpatient Hospital Stay: Payer: Medicare Other | Attending: Oncology | Admitting: Oncology

## 2022-11-08 ENCOUNTER — Inpatient Hospital Stay: Payer: Medicare Other

## 2022-11-08 ENCOUNTER — Telehealth: Payer: Self-pay | Admitting: Oncology

## 2022-11-08 VITALS — BP 176/74 | HR 63 | Temp 97.6°F | Resp 12 | Ht 60.0 in | Wt 104.8 lb

## 2022-11-08 DIAGNOSIS — I129 Hypertensive chronic kidney disease with stage 1 through stage 4 chronic kidney disease, or unspecified chronic kidney disease: Secondary | ICD-10-CM | POA: Diagnosis not present

## 2022-11-08 DIAGNOSIS — D631 Anemia in chronic kidney disease: Secondary | ICD-10-CM

## 2022-11-08 DIAGNOSIS — D649 Anemia, unspecified: Secondary | ICD-10-CM

## 2022-11-08 DIAGNOSIS — N183 Chronic kidney disease, stage 3 unspecified: Secondary | ICD-10-CM

## 2022-11-08 DIAGNOSIS — R5383 Other fatigue: Secondary | ICD-10-CM

## 2022-11-08 HISTORY — DX: Anemia in chronic kidney disease: D63.1

## 2022-11-08 LAB — IRON AND TIBC
Iron: 62 ug/dL (ref 28–170)
Saturation Ratios: 17 % (ref 10.4–31.8)
TIBC: 358 ug/dL (ref 250–450)
UIBC: 296 ug/dL

## 2022-11-08 LAB — VITAMIN B12: Vitamin B-12: 6447 pg/mL — ABNORMAL HIGH (ref 180–914)

## 2022-11-08 LAB — BASIC METABOLIC PANEL
BUN: 46 — AB (ref 4–21)
CO2: 23 — AB (ref 13–22)
Chloride: 110 — AB (ref 99–108)
Creatinine: 1.5 — AB (ref 0.5–1.1)
Glucose: 111
Potassium: 4.3 mEq/L (ref 3.5–5.1)
Sodium: 143 (ref 137–147)

## 2022-11-08 LAB — CBC AND DIFFERENTIAL
HCT: 28 — AB (ref 36–46)
Hemoglobin: 9.2 — AB (ref 12.0–16.0)
Neutrophils Absolute: 3.98
Platelets: 177 10*3/uL (ref 150–400)
WBC: 5.1

## 2022-11-08 LAB — HEPATIC FUNCTION PANEL
ALT: 89 U/L — AB (ref 7–35)
AST: 98 — AB (ref 13–35)
Alkaline Phosphatase: 55 (ref 25–125)
Bilirubin, Total: 0.6

## 2022-11-08 LAB — COMPREHENSIVE METABOLIC PANEL
Albumin: 4.3 (ref 3.5–5.0)
Calcium: 9.2 (ref 8.7–10.7)

## 2022-11-08 LAB — FERRITIN: Ferritin: 194 ng/mL (ref 11–307)

## 2022-11-08 LAB — CBC: RBC: 2.92 — AB (ref 3.87–5.11)

## 2022-11-08 NOTE — Telephone Encounter (Signed)
11/08/22 Next appt scheduled and confirmed with patient

## 2022-11-11 LAB — FOLATE: Folate: 39.6 ng/mL (ref >5.9)

## 2022-11-12 LAB — PROTEIN ELECTROPHORESIS, SERUM
A/G Ratio: 1.2 (ref 0.7–1.7)
Albumin ELP: 3.7 g/dL (ref 2.9–4.4)
Alpha-1-Globulin: 0.3 g/dL (ref 0.0–0.4)
Alpha-2-Globulin: 0.6 g/dL (ref 0.4–1.0)
Beta Globulin: 0.9 g/dL (ref 0.7–1.3)
Gamma Globulin: 1.4 g/dL (ref 0.4–1.8)
Globulin, Total: 3.2 g/dL (ref 2.2–3.9)
Total Protein ELP: 6.9 g/dL (ref 6.0–8.5)

## 2022-11-13 DIAGNOSIS — E785 Hyperlipidemia, unspecified: Secondary | ICD-10-CM | POA: Diagnosis not present

## 2022-11-13 DIAGNOSIS — I10 Essential (primary) hypertension: Secondary | ICD-10-CM | POA: Diagnosis not present

## 2022-11-13 DIAGNOSIS — J189 Pneumonia, unspecified organism: Secondary | ICD-10-CM | POA: Diagnosis not present

## 2022-11-13 DIAGNOSIS — R0602 Shortness of breath: Secondary | ICD-10-CM | POA: Diagnosis not present

## 2022-11-13 DIAGNOSIS — I509 Heart failure, unspecified: Secondary | ICD-10-CM | POA: Diagnosis not present

## 2022-11-13 DIAGNOSIS — R0902 Hypoxemia: Secondary | ICD-10-CM | POA: Diagnosis not present

## 2022-11-13 DIAGNOSIS — J9811 Atelectasis: Secondary | ICD-10-CM | POA: Diagnosis not present

## 2022-11-13 DIAGNOSIS — J90 Pleural effusion, not elsewhere classified: Secondary | ICD-10-CM | POA: Diagnosis not present

## 2022-11-14 DIAGNOSIS — I509 Heart failure, unspecified: Secondary | ICD-10-CM | POA: Diagnosis not present

## 2022-11-14 DIAGNOSIS — J9811 Atelectasis: Secondary | ICD-10-CM | POA: Diagnosis not present

## 2022-11-14 DIAGNOSIS — N183 Chronic kidney disease, stage 3 unspecified: Secondary | ICD-10-CM | POA: Diagnosis not present

## 2022-11-14 DIAGNOSIS — Z881 Allergy status to other antibiotic agents status: Secondary | ICD-10-CM | POA: Diagnosis not present

## 2022-11-14 DIAGNOSIS — Z950 Presence of cardiac pacemaker: Secondary | ICD-10-CM | POA: Diagnosis not present

## 2022-11-14 DIAGNOSIS — Z79899 Other long term (current) drug therapy: Secondary | ICD-10-CM | POA: Diagnosis not present

## 2022-11-14 DIAGNOSIS — M1990 Unspecified osteoarthritis, unspecified site: Secondary | ICD-10-CM | POA: Diagnosis not present

## 2022-11-14 DIAGNOSIS — M109 Gout, unspecified: Secondary | ICD-10-CM | POA: Diagnosis not present

## 2022-11-14 DIAGNOSIS — N179 Acute kidney failure, unspecified: Secondary | ICD-10-CM | POA: Diagnosis not present

## 2022-11-14 DIAGNOSIS — I1310 Hypertensive heart and chronic kidney disease without heart failure, with stage 1 through stage 4 chronic kidney disease, or unspecified chronic kidney disease: Secondary | ICD-10-CM | POA: Diagnosis not present

## 2022-11-14 DIAGNOSIS — F419 Anxiety disorder, unspecified: Secondary | ICD-10-CM | POA: Diagnosis not present

## 2022-11-14 DIAGNOSIS — I272 Pulmonary hypertension, unspecified: Secondary | ICD-10-CM | POA: Diagnosis not present

## 2022-11-14 DIAGNOSIS — E785 Hyperlipidemia, unspecified: Secondary | ICD-10-CM | POA: Diagnosis not present

## 2022-11-14 DIAGNOSIS — N1832 Chronic kidney disease, stage 3b: Secondary | ICD-10-CM | POA: Diagnosis not present

## 2022-11-14 DIAGNOSIS — R0602 Shortness of breath: Secondary | ICD-10-CM | POA: Diagnosis not present

## 2022-11-14 DIAGNOSIS — D631 Anemia in chronic kidney disease: Secondary | ICD-10-CM | POA: Diagnosis not present

## 2022-11-14 DIAGNOSIS — N281 Cyst of kidney, acquired: Secondary | ICD-10-CM | POA: Diagnosis not present

## 2022-11-14 DIAGNOSIS — I050 Rheumatic mitral stenosis: Secondary | ICD-10-CM | POA: Diagnosis not present

## 2022-11-14 DIAGNOSIS — Z952 Presence of prosthetic heart valve: Secondary | ICD-10-CM | POA: Diagnosis not present

## 2022-11-14 DIAGNOSIS — J90 Pleural effusion, not elsewhere classified: Secondary | ICD-10-CM | POA: Diagnosis not present

## 2022-11-14 DIAGNOSIS — R531 Weakness: Secondary | ICD-10-CM | POA: Diagnosis not present

## 2022-11-14 DIAGNOSIS — I130 Hypertensive heart and chronic kidney disease with heart failure and stage 1 through stage 4 chronic kidney disease, or unspecified chronic kidney disease: Secondary | ICD-10-CM | POA: Diagnosis not present

## 2022-11-14 DIAGNOSIS — I501 Left ventricular failure: Secondary | ICD-10-CM | POA: Diagnosis not present

## 2022-11-14 DIAGNOSIS — Z7982 Long term (current) use of aspirin: Secondary | ICD-10-CM | POA: Diagnosis not present

## 2022-11-14 DIAGNOSIS — I10 Essential (primary) hypertension: Secondary | ICD-10-CM | POA: Diagnosis not present

## 2022-11-14 DIAGNOSIS — Z954 Presence of other heart-valve replacement: Secondary | ICD-10-CM | POA: Diagnosis not present

## 2022-11-14 DIAGNOSIS — R0902 Hypoxemia: Secondary | ICD-10-CM | POA: Diagnosis not present

## 2022-11-14 DIAGNOSIS — E7800 Pure hypercholesterolemia, unspecified: Secondary | ICD-10-CM | POA: Diagnosis not present

## 2022-11-14 DIAGNOSIS — Z66 Do not resuscitate: Secondary | ICD-10-CM | POA: Diagnosis not present

## 2022-11-14 DIAGNOSIS — F319 Bipolar disorder, unspecified: Secondary | ICD-10-CM | POA: Diagnosis not present

## 2022-11-14 DIAGNOSIS — I5033 Acute on chronic diastolic (congestive) heart failure: Secondary | ICD-10-CM | POA: Diagnosis not present

## 2022-11-14 NOTE — Progress Notes (Deleted)
[facility]  [address] [place],  [place]  [address] [phone]  Clinic Day:  11/14/2022  Referring physician: Angelina Sheriff, MD   HISTORY OF PRESENT ILLNESS:  The patient is a 89 y.o. female  who I was asked to consult upon for anemia.  Labs in late January 2024 showed a low hemoglobin of 9.5.  Of note, her MCV was normal at 96.  According to the patient, she has never been told before of having anemia.  She denies having any overt forms of blood loss to explain her anemia.  She claims her last colonoscopy was done around when she was 89 years old, which came back essentially unremarkable.  She also denies being placed on any new medications, particularly any which are known to cause anemia via bone marrow suppression.  To her knowledge there is no family history of anemia or other hematologic disorders.  The patient has had an aortic valve replacement, but it was not with a mechanical valve.  PAST MEDICAL HISTORY:   Past Medical History:  Diagnosis Date   Acute on chronic diastolic heart failure ([place]) 03/18/2018   Aortic stenosis 10/14/2014   Formatting of this note might be different from the original.  moderate by echo 11/2015   Arthritis    Bilateral renal masses 07/26/2017   Bone spur of toe of left foot    Chronic diastolic (congestive) heart failure (HCC)    Chronic diastolic heart failure ([place]) 03/18/2018   CKD (chronic kidney disease) stage 3, GFR 30-59 ml/min (HCC) 07/26/2017   Essential hypertension    GERD (gastroesophageal reflux disease)    HLD (hyperlipidemia)    Hyperlipidemia 11/25/2016   Pacemaker 09/02/2018   Pancreatic lesion 04/17/2018   Pancreatic mass    a. 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 03/2018: s/p TAVR by Burt Knack and Dr. Roxy Manns   Stage 3 chronic kidney disease ([facility])    TIA (transient ischemic attack)     a. 1992   VSD (ventricular septal defect)     PAST SURGICAL HISTORY:   Past Surgical History:  Procedure Laterality Date   ABDOMINAL HYSTERECTOMY     APPENDECTOMY     HERNIA REPAIR     INTRAOPERATIVE TRANSTHORACIC ECHOCARDIOGRAM N/A 03/18/2018   Procedure: INTRAOPERATIVE TRANSTHORACIC ECHOCARDIOGRAM;  Surgeon: Sherren Mocha, MD;  Location: [facility];  Service: Open Heart Surgery;  Laterality: N/A;   PACEMAKER IMPLANT N/A 03/20/2018   Procedure: PACEMAKER IMPLANT;  Surgeon: Constance Haw, MD;  Location: [facility];  Service: Cardiovascular;  Laterality: N/A;   RIGHT HEART CATH N/A 03/20/2018   Procedure: RIGHT HEART CATH;  Surgeon: Sherren Mocha, MD;  Location: [facility];  Service: Cardiovascular;  Laterality: N/A;   RIGHT/LEFT HEART CATH AND CORONARY ANGIOGRAPHY N/A 02/06/2018   Procedure: RIGHT/LEFT HEART CATH AND CORONARY ANGIOGRAPHY;  Surgeon: Sherren Mocha, MD;  Location: [facility];  Service: Cardiovascular;  Laterality: N/A;   TONSILLECTOMY     TRANSCATHETER AORTIC VALVE REPLACEMENT, TRANSFEMORAL N/A 03/18/2018   Procedure: TRANSCATHETER AORTIC VALVE REPLACEMENT, TRANSFEMORAL;  Surgeon: Sherren Mocha, MD;  Location: [facility];  Service: Open Heart Surgery;  Laterality: N/A;   WISDOM  TOOTH EXTRACTION      CURRENT MEDICATIONS:   Current Outpatient Medications  Medication Sig Dispense Refill   acetaminophen (TYLENOL) 650 MG CR tablet Take 650-1,300 mg by mouth every 8 (eight) hours as needed for pain.     Allopurinol 200 MG TABS Take 200 mg by mouth daily.     amLODipine (NORVASC) 2.5 MG tablet Take 2.5 mg by mouth daily.     amoxicillin (AMOXIL) 500 MG tablet Take 4 tablets (2,000 mg total) by mouth as directed. 1 hour prior to dental work including cleanings (Patient not taking: Reported on 11/08/2022) 12  tablet 12   aspirin EC 81 MG tablet Take 81 mg by mouth daily.      Carboxymethylcellulose Sod PF (THERATEARS PF) 0.25 % SOLN Place 1 drop into both eyes in the morning and at bedtime.     Cyanocobalamin (B-12) 2500 MCG TABS Take 2,500 mcg by mouth daily.      estradiol (ESTRACE) 1 MG tablet Take 0.5 mg by mouth daily.     fluticasone (FLONASE) 50 MCG/ACT nasal spray Place 2 sprays into both nostrils daily as needed for allergies or rhinitis.      furosemide (LASIX) 20 MG tablet Take 1 tablet (20 mg total) by mouth every other day. 45 tablet 3   hydrALAZINE (APRESOLINE) 25 MG tablet Take 1 tablet (25 mg total) by mouth 3 (three) times daily as needed (when BP is over 180). 270 tablet 3   lisinopril (ZESTRIL) 20 MG tablet Take 1 tablet by mouth once daily 90 tablet 2   meclizine (ANTIVERT) 25 MG tablet Take 25 mg by mouth every 8 (eight) hours as needed for dizziness.     metoprolol tartrate (LOPRESSOR) 50 MG tablet Take 1 tablet by mouth twice daily 180 tablet 0   Multiple Vitamins-Minerals (MULTI-VITAMIN GUMMIES) CHEW Chew 2 tablets by mouth daily.      Omega-3 Fatty Acids (FISH OIL) 1000 MG CAPS Take 1,000 mg by mouth daily.     Vitamin D, Ergocalciferol, (DRISDOL) 1.25 MG (50000 UT) CAPS capsule Take 50,000 Units by mouth See admin instructions. Take 50,000 units by mouth 2 times a week- Mondays and Fridays  1   No current facility-administered medications for this visit.    ALLERGIES:   Allergies  Allergen Reactions   Clarithromycin Other (See Comments)    Felt like body was swollen, felt awful"- and, angioedema   Latex Rash and Other (See Comments)    Causes sores and Rash-Generalized   Levofloxacin Other (See Comments)    Hallucinations    Azithromycin Other (See Comments)    Reaction??   Diphenhydramine Hcl     Other reaction(s): Unknown   Ivp Dye [Iodinated Contrast Media]    FAMILY HISTORY:   Family History  Problem Relation Age of Onset   Heart disease Mother     Uterine cancer Sister    Congenital heart disease Brother    Heart attack Brother     SOCIAL HISTORY:  The patient was born and raised in [place].  She lives [place] town with her husband of 7 years.  They have 1 child and 1 grandchild.  She previously worked at a Multimedia programmer, at [employer], and at a [employer].  There is no history of alcoholism or tobacco abuse.  REVIEW OF SYSTEMS:  Review of Systems  Constitutional:  Positive for fatigue. Negative for fever.  HENT:   Positive for hearing loss. Negative for sore throat.  Eyes:  Negative for eye problems.  Respiratory:  Positive for shortness of breath. Negative for chest tightness, cough and hemoptysis.   Cardiovascular:  Negative for chest pain and palpitations.  Gastrointestinal:  Positive for diarrhea. Negative for abdominal distention, abdominal pain, blood in stool, constipation, nausea and vomiting.  Endocrine: Negative for hot flashes.  Genitourinary:  Positive for frequency. Negative for difficulty urinating, dysuria, hematuria and nocturia.   Musculoskeletal:  Positive for arthralgias. Negative for back pain, gait problem and myalgias.  Skin: Negative.  Negative for itching and rash.  Neurological: Negative.  Negative for dizziness, extremity weakness, gait problem, headaches, light-headedness and numbness.  Hematological:  Bruises/bleeds easily.  Psychiatric/Behavioral: Negative.  Negative for depression and suicidal ideas. The patient is not nervous/anxious.    PHYSICAL EXAM:  There were no vitals taken for this visit. Wt Readings from Last 3 Encounters:  11/08/22 104 lb 12.8 oz (47.5 kg)  06/19/22 105 lb 3.2 oz (47.7 kg)  01/08/22 106 lb 3.2 oz (48.2 kg)   There is no height or weight on file to calculate BMI. Performance status (ECOG): 1 - Symptomatic but completely ambulatory Physical Exam Constitutional:      Appearance: Normal appearance. She is not ill-appearing.  HENT:      Mouth/Throat:     Mouth: Mucous membranes are moist.     Pharynx: Oropharynx is clear. No oropharyngeal exudate or posterior oropharyngeal erythema.  Cardiovascular:     Rate and Rhythm: Normal rate and regular rhythm.     Heart sounds: Murmur heard.     No friction rub. No gallop.  Pulmonary:     Effort: Pulmonary effort is normal. No respiratory distress.     Breath sounds: Normal breath sounds. No wheezing, rhonchi or rales.  Abdominal:     General: Bowel sounds are normal. There is no distension.     Palpations: Abdomen is soft. There is no mass.     Tenderness: There is no abdominal tenderness.  Musculoskeletal:        General: No swelling.     Right lower leg: No edema.     Left lower leg: No edema.  Lymphadenopathy:     Cervical: No cervical adenopathy.     Upper Body:     Right upper body: No supraclavicular or axillary adenopathy.     Left upper body: No supraclavicular or axillary adenopathy.     Lower Body: No right inguinal adenopathy. No left inguinal adenopathy.  Skin:    General: Skin is warm.     Coloration: Skin is not jaundiced.     Findings: No lesion or rash.  Neurological:     General: No focal deficit present.     Mental Status: She is alert and oriented to person, place, and time. Mental status is at baseline.  Psychiatric:        Mood and Affect: Mood normal.        Behavior: Behavior normal.        Thought Content: Thought content normal.    LABS:       Latest Ref Rng & Units 11/08/2022   12:00 AM 07/20/2022    2:16 PM 06/19/2022    3:00 PM  CMP  Glucose 70 - 99 mg/dL  98  84   BUN 4 - 21 46     42  41   Creatinine 0.5 - 1.1 1.5     1.82  1.95   Sodium 137 - 147 143  140  142   Potassium 3.5 - 5.1 mEq/L 4.3     4.9  5.5   Chloride 99 - 108 110     103  101   CO2 13 - 22 23     23  25   $ Calcium 8.7 - 10.7 9.2     9.4  9.8   Alkaline Phos 25 - 125 55        AST 13 - 35 98        ALT 7 - 35 U/L 89           This result is from an  external source.     Latest Reference Range & Units 11/08/22 14:52  Iron 28 - 170 ug/dL 62  UIBC ug/dL 296  TIBC 250 - 450 ug/dL 358  Saturation Ratios 10.4 - 31.8 % 17  Ferritin 11 - 307 ng/mL 194  Folate >5.9 ng/mL 39.6  Vitamin B12 180 - 914 pg/mL 6,447 (H)  (H): Data is abnormally high  Latest Reference Range & Units 11/08/22 14:52  Total Protein ELP 6.0 - 8.5 g/dL 6.9  Albumin ELP 2.9 - 4.4 g/dL 3.7  Globulin, Total 2.2 - 3.9 g/dL 3.2 (C)  A/G Ratio 0.7 - 1.7  1.2 (C)  Alpha-1-Globulin 0.0 - 0.4 g/dL 0.3  Alpha-2-Globulin 0.4 - 1.0 g/dL 0.6  Beta Globulin 0.7 - 1.3 g/dL 0.9  Gamma Globulin 0.4 - 1.8 g/dL 1.4  M-SPIKE, % Not Observed g/dL Not Observed  (C): Corrected ASSESSMENT & PLAN:  A 89 y.o. female who I was asked to consult upon for anemia.  When evaluating her history, this patient does have known chronic renal insufficiency, which could be the major reason behind her anemia.  For completeness, I will check her iron, vitamin B12, and folate levels to ensure there are no nutritional deficiencies factoring into her anemia.  I will also check a serum protein electrophoresis to ensure an underlying plasma cell dyscrasia, such as multiple myeloma, is not behind her anemia.  The only other diagnosis that I would be worried about is underlying myelodysplasia.  Ultimately, a bone marrow biopsy would be needed to prove this diagnosis.  I will see this patient back in 1 week to go over all of her labs collected today, which will be used to formulate her next course of action as it pertains to her anemia management.  The patient understands all the plans discussed today and is in agreement with them.  I do appreciate Angelina Sheriff, MD for his new consult.   Terrianne Cavness Macarthur Critchley, MD

## 2022-11-15 ENCOUNTER — Inpatient Hospital Stay: Payer: Medicare Other | Admitting: Oncology

## 2022-11-15 DIAGNOSIS — I050 Rheumatic mitral stenosis: Secondary | ICD-10-CM | POA: Diagnosis not present

## 2022-11-15 DIAGNOSIS — Z950 Presence of cardiac pacemaker: Secondary | ICD-10-CM | POA: Diagnosis not present

## 2022-11-15 DIAGNOSIS — J90 Pleural effusion, not elsewhere classified: Secondary | ICD-10-CM | POA: Diagnosis not present

## 2022-11-15 DIAGNOSIS — I272 Pulmonary hypertension, unspecified: Secondary | ICD-10-CM | POA: Diagnosis not present

## 2022-11-15 DIAGNOSIS — I509 Heart failure, unspecified: Secondary | ICD-10-CM | POA: Diagnosis not present

## 2022-11-15 NOTE — Progress Notes (Deleted)
Cardiology Office Note:    Date:  11/15/2022   ID:  Kaitlin Villarreal, DOB 12/10/1925, MRN [id number]  PCP:  Angelina Sheriff, MD  Cardiologist:  Shirlee More, MD    Referring MD: Angelina Sheriff, MD    ASSESSMENT:    No diagnosis found. PLAN:    In order of problems listed above:  ***   Next appointment: ***   Medication Adjustments/Labs and Tests Ordered: Current medicines are reviewed at length with the patient today.  Concerns regarding medicines are outlined above.  No orders of the defined types were placed in this encounter.  No orders of the defined types were placed in this encounter.   No chief complaint on file.   History of Present Illness:    Kaitlin Villarreal is a 89 y.o. female with a hx of severe symptomatic aortic stenosis with TAVR June 2019 permanent pacemaker following TAVR for heart block hypertensive heart disease with heart failure and chronic kidney disease gout and gouty arthritis last seen 06/19/2022.  She was recently seen by hematology 11/08/2022 for anemia with chronic kidney disease.  She was seen in [facility] ED 11/14/2022 with weakness.  Notation she was seen before in the emergency room the day prior with heart failure and given a dose of IV diuretic laboratory studies showed a hemoglobin of 10.8 creatinine 1.70 GFR stage IV CKD creatinine and GFR 1.728 cc/min potassium 4.4 BNP level the day prior was recorded at 7690 the EKG shows sinus rhythm minor nonspecific ST abnormality.  She had an EKG performed in the ED showing sinus rhythm nonspecific ST and T abnormality personally reviewed chest x-ray showed interstitial prominence with small effusions. Compliance with diet, lifestyle and medications: *** Past Medical History:  Diagnosis Date   Acute on chronic diastolic heart failure ([place]) 03/18/2018   Aortic stenosis 10/14/2014   Formatting of this note might be different from the original.  moderate by echo 11/2015   Arthritis     Bilateral renal masses 07/26/2017   Bone spur of toe of left foot    Chronic diastolic (congestive) heart failure (HCC)    Chronic diastolic heart failure ([place]) 03/18/2018   CKD (chronic kidney disease) stage 3, GFR 30-59 ml/min (H[facility]) 07/26/2017   Essential hypertension    GERD (gastroesophageal reflux disease)    HLD (hyperlipidemia)    Hyperlipidemia 11/25/2016   Pacemaker 09/02/2018   Pancreatic lesion 04/17/2018   Pancreatic mass    a. 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 03/2018: s/p TAVR by Burt Knack and Dr. Roxy Manns   Stage 3 chronic kidney disease ([place])    TIA (transient ischemic attack)     a. 1992   VSD (ventricular septal defect)     Past Surgical History:  Procedure Laterality Date   ABDOMINAL HYSTERECTOMY     APPENDECTOMY     HERNIA REPAIR     INTRAOPERATIVE TRANSTHORACIC ECHOCARDIOGRAM N/A 03/18/2018   Procedure: INTRAOPERATIVE TRANSTHORACIC ECHOCARDIOGRAM;  Surgeon: Sherren Mocha, MD;  Location: [place];  Service: Open Heart Surgery;  Laterality: N/A;   PACEMAKER IMPLANT N/A 03/20/2018   Procedure: PACEMAKER IMPLANT;  Surgeon: Constance Haw, MD;  Location: [facility];  Service: Cardiovascular;  Laterality: N/A;   RIGHT HEART CATH N/A 03/20/2018   Procedure: RIGHT HEART CATH;  Surgeon: Sherren Mocha, MD;  Location: [place] [facility];  Service: Cardiovascular;  Laterality: N/A;   RIGHT/LEFT HEART CATH AND CORONARY ANGIOGRAPHY N/A 02/06/2018   Procedure: RIGHT/LEFT HEART CATH AND CORONARY ANGIOGRAPHY;  Surgeon: Sherren Mocha, MD;  Location: [place] [facility];  Service: Cardiovascular;  Laterality: N/A;   TONSILLECTOMY     TRANSCATHETER AORTIC VALVE REPLACEMENT, TRANSFEMORAL N/A 03/18/2018   Procedure:  TRANSCATHETER AORTIC VALVE REPLACEMENT, TRANSFEMORAL;  Surgeon: Sherren Mocha, MD;  Location: [place];  Service: Open Heart Surgery;  Laterality: N/A;   WISDOM TOOTH EXTRACTION      Current Medications: No outpatient medications have been marked as taking for the 11/16/22 encounter (Appointment) with Richardo Priest, MD.     Allergies:   Clarithromycin, Latex, Levofloxacin, Azithromycin, Diphenhydramine hcl, and Ivp dye [iodinated contrast media]   Social History   Socioeconomic History   Marital status: Married    Spouse name: Not on file   Number of children: Not on file   Years of education: Not on file   Highest education level: Not on file  Occupational History   Not on file  Tobacco Use   Smoking status: Never    Passive exposure: Never   Smokeless tobacco: Never  Vaping Use   Vaping Use: Never used  Substance and Sexual Activity   Alcohol use: No   Drug use: No   Sexual activity: Not on file  Other Topics Concern   Not on file  Social History Narrative   Not on file   Social Determinants of Health   Financial Resource Strain: Not on file  Food Insecurity: No Food Insecurity (11/08/2022)   Hunger Vital Sign    Worried About Running Out of Food in the Last Year: Never true    Ran Out of Food in the Last Year: Never true  Transportation Needs: No Transportation Needs (11/08/2022)   PRAPARE - Hydrologist (Medical): No    Lack of Transportation (Non-Medical): No  Physical Activity: Not on file  Stress: Not on file  Social Connections: Not on file     Family History: The patient's ***family history includes Congenital heart disease in her brother; Heart attack in her brother; Heart disease in her mother; Uterine cancer in her sister. ROS:   Please see the history of present illness.    All other systems reviewed and are negative.  EKGs/Labs/Other Studies Reviewed:    The following studies were reviewed today:  EKG:  EKG ordered  today and personally reviewed.  The ekg ordered today demonstrates ***  Recent Labs: 11/08/2022: ALT 89; BUN 46; Creatinine 1.5; Hemoglobin 9.2; Platelets 177; Potassium 4.3; Sodium 143  Recent Lipid Panel    Component Value Date/Time   CHOL 217 (H) 04/10/2021 1357   TRIG 180 (H) 04/10/2021 1357   HDL 78 04/10/2021 1357   CHOLHDL 2.8 04/10/2021 1357   LDLCALC 108 (H) 04/10/2021 1357    Physical Exam:    VS:  There were no vitals taken for this visit.    Wt Readings from Last 3 Encounters:  11/08/22 104 lb 12.8 oz (47.5 kg)  06/19/22 105 lb 3.2 oz (47.7 kg)  01/08/22 106 lb 3.2 oz (48.2 kg)     GEN: *** Well nourished, well developed in no acute distress HEENT: Normal NECK: No JVD; No carotid bruits LYMPHATICS: No lymphadenopathy CARDIAC: ***RRR, no murmurs,  rubs, gallops RESPIRATORY:  Clear to auscultation without rales, wheezing or rhonchi  ABDOMEN: Soft, non-tender, non-distended MUSCULOSKELETAL:  No edema; No deformity  SKIN: Warm and dry NEUROLOGIC:  Alert and oriented x 3 PSYCHIATRIC:  Normal affect    Signed, Shirlee More, MD  11/15/2022 6:11 PM    [facility]

## 2022-11-16 ENCOUNTER — Ambulatory Visit: Payer: Medicare Other | Admitting: Cardiology

## 2022-11-16 DIAGNOSIS — I509 Heart failure, unspecified: Secondary | ICD-10-CM | POA: Diagnosis not present

## 2022-11-16 DIAGNOSIS — I050 Rheumatic mitral stenosis: Secondary | ICD-10-CM | POA: Diagnosis not present

## 2022-11-16 DIAGNOSIS — I272 Pulmonary hypertension, unspecified: Secondary | ICD-10-CM | POA: Diagnosis not present

## 2022-11-16 DIAGNOSIS — Z952 Presence of prosthetic heart valve: Secondary | ICD-10-CM

## 2022-11-16 DIAGNOSIS — Z950 Presence of cardiac pacemaker: Secondary | ICD-10-CM | POA: Diagnosis not present

## 2022-11-17 DIAGNOSIS — I130 Hypertensive heart and chronic kidney disease with heart failure and stage 1 through stage 4 chronic kidney disease, or unspecified chronic kidney disease: Secondary | ICD-10-CM | POA: Diagnosis not present

## 2022-11-17 DIAGNOSIS — I700 Atherosclerosis of aorta: Secondary | ICD-10-CM | POA: Diagnosis not present

## 2022-11-17 DIAGNOSIS — Z952 Presence of prosthetic heart valve: Secondary | ICD-10-CM | POA: Diagnosis not present

## 2022-11-17 DIAGNOSIS — Z7951 Long term (current) use of inhaled steroids: Secondary | ICD-10-CM | POA: Diagnosis not present

## 2022-11-17 DIAGNOSIS — M109 Gout, unspecified: Secondary | ICD-10-CM | POA: Diagnosis not present

## 2022-11-17 DIAGNOSIS — D631 Anemia in chronic kidney disease: Secondary | ICD-10-CM | POA: Diagnosis not present

## 2022-11-17 DIAGNOSIS — Z556 Problems related to health literacy: Secondary | ICD-10-CM | POA: Diagnosis not present

## 2022-11-17 DIAGNOSIS — E7800 Pure hypercholesterolemia, unspecified: Secondary | ICD-10-CM | POA: Diagnosis not present

## 2022-11-17 DIAGNOSIS — N179 Acute kidney failure, unspecified: Secondary | ICD-10-CM | POA: Diagnosis not present

## 2022-11-17 DIAGNOSIS — M1990 Unspecified osteoarthritis, unspecified site: Secondary | ICD-10-CM | POA: Diagnosis not present

## 2022-11-17 DIAGNOSIS — I272 Pulmonary hypertension, unspecified: Secondary | ICD-10-CM | POA: Diagnosis not present

## 2022-11-17 DIAGNOSIS — Z950 Presence of cardiac pacemaker: Secondary | ICD-10-CM | POA: Diagnosis not present

## 2022-11-17 DIAGNOSIS — J9811 Atelectasis: Secondary | ICD-10-CM | POA: Diagnosis not present

## 2022-11-17 DIAGNOSIS — J309 Allergic rhinitis, unspecified: Secondary | ICD-10-CM | POA: Diagnosis not present

## 2022-11-17 DIAGNOSIS — N183 Chronic kidney disease, stage 3 unspecified: Secondary | ICD-10-CM | POA: Diagnosis not present

## 2022-11-17 DIAGNOSIS — I739 Peripheral vascular disease, unspecified: Secondary | ICD-10-CM | POA: Diagnosis not present

## 2022-11-17 DIAGNOSIS — I5031 Acute diastolic (congestive) heart failure: Secondary | ICD-10-CM | POA: Diagnosis not present

## 2022-11-17 DIAGNOSIS — M810 Age-related osteoporosis without current pathological fracture: Secondary | ICD-10-CM | POA: Diagnosis not present

## 2022-11-17 DIAGNOSIS — I071 Rheumatic tricuspid insufficiency: Secondary | ICD-10-CM | POA: Diagnosis not present

## 2022-11-17 DIAGNOSIS — I2510 Atherosclerotic heart disease of native coronary artery without angina pectoris: Secondary | ICD-10-CM | POA: Diagnosis not present

## 2022-11-17 DIAGNOSIS — Z9181 History of falling: Secondary | ICD-10-CM | POA: Diagnosis not present

## 2022-11-17 DIAGNOSIS — K589 Irritable bowel syndrome without diarrhea: Secondary | ICD-10-CM | POA: Diagnosis not present

## 2022-11-17 DIAGNOSIS — J90 Pleural effusion, not elsewhere classified: Secondary | ICD-10-CM | POA: Diagnosis not present

## 2022-11-17 DIAGNOSIS — I252 Old myocardial infarction: Secondary | ICD-10-CM | POA: Diagnosis not present

## 2022-11-17 DIAGNOSIS — Z7982 Long term (current) use of aspirin: Secondary | ICD-10-CM | POA: Diagnosis not present

## 2022-11-19 DIAGNOSIS — I5031 Acute diastolic (congestive) heart failure: Secondary | ICD-10-CM | POA: Diagnosis not present

## 2022-11-19 DIAGNOSIS — N179 Acute kidney failure, unspecified: Secondary | ICD-10-CM | POA: Diagnosis not present

## 2022-11-19 DIAGNOSIS — D631 Anemia in chronic kidney disease: Secondary | ICD-10-CM | POA: Diagnosis not present

## 2022-11-19 DIAGNOSIS — N183 Chronic kidney disease, stage 3 unspecified: Secondary | ICD-10-CM | POA: Diagnosis not present

## 2022-11-19 DIAGNOSIS — J90 Pleural effusion, not elsewhere classified: Secondary | ICD-10-CM | POA: Diagnosis not present

## 2022-11-19 DIAGNOSIS — I130 Hypertensive heart and chronic kidney disease with heart failure and stage 1 through stage 4 chronic kidney disease, or unspecified chronic kidney disease: Secondary | ICD-10-CM | POA: Diagnosis not present

## 2022-11-21 DIAGNOSIS — I130 Hypertensive heart and chronic kidney disease with heart failure and stage 1 through stage 4 chronic kidney disease, or unspecified chronic kidney disease: Secondary | ICD-10-CM | POA: Diagnosis not present

## 2022-11-21 DIAGNOSIS — J90 Pleural effusion, not elsewhere classified: Secondary | ICD-10-CM | POA: Diagnosis not present

## 2022-11-21 DIAGNOSIS — N179 Acute kidney failure, unspecified: Secondary | ICD-10-CM | POA: Diagnosis not present

## 2022-11-21 DIAGNOSIS — N183 Chronic kidney disease, stage 3 unspecified: Secondary | ICD-10-CM | POA: Diagnosis not present

## 2022-11-21 DIAGNOSIS — D631 Anemia in chronic kidney disease: Secondary | ICD-10-CM | POA: Diagnosis not present

## 2022-11-21 DIAGNOSIS — I5031 Acute diastolic (congestive) heart failure: Secondary | ICD-10-CM | POA: Diagnosis not present

## 2022-11-21 NOTE — Progress Notes (Signed)
[facility]  [address] [place],    [address] [phone]  Clinic Day:  11/22/2022  Referring physician: Angelina Sheriff, MD   HISTORY OF PRESENT ILLNESS:  The patient is a 89 y.o. female  who I recently began seeing for anemia.  She comes in today to go over all of her recent labs to determine the etiology behind this.  Since her last visit, the patient has been doing okay.  She denies having any overt forms of blood loss, but has had moderate fatigue.   PHYSICAL EXAM: Blood pressure (!) 178/74, pulse 60, temperature (!) 97.4 F (36.3 C), resp. rate 14, height 5' (1.524 m), weight 102 lb 8 oz (46.5 kg), SpO2 95 %. Wt Readings from Last 3 Encounters:  11/22/22 102 lb 8 oz (46.5 kg)  11/08/22 104 lb 12.8 oz (47.5 kg)  06/19/22 105 lb 3.2 oz (47.7 kg)   Body mass index is 20.02 kg/m. Performance status (ECOG): 1 - Symptomatic but completely ambulatory Physical Exam Constitutional:      Appearance: Normal appearance. She is not ill-appearing.  HENT:     Mouth/Throat:     Mouth: Mucous membranes are moist.     Pharynx: Oropharynx is clear. No oropharyngeal exudate or posterior oropharyngeal erythema.  Cardiovascular:     Rate and Rhythm: Normal rate and regular rhythm.     Heart sounds: Murmur heard.     No friction rub. No gallop.  Pulmonary:     Effort: Pulmonary effort is normal. No respiratory distress.     Breath sounds: Normal breath sounds. No wheezing, rhonchi or rales.  Abdominal:     General: Bowel sounds are normal. There is no distension.     Palpations: Abdomen is soft. There is no mass.     Tenderness: There is no abdominal tenderness.  Musculoskeletal:        General: No swelling.     Right lower leg: No edema.     Left lower leg: No edema.  Lymphadenopathy:     Cervical: No cervical adenopathy.     Upper Body:     Right upper body: No supraclavicular or axillary adenopathy.     Left upper body: No  supraclavicular or axillary adenopathy.     Lower Body: No right inguinal adenopathy. No left inguinal adenopathy.  Skin:    General: Skin is warm.     Coloration: Skin is not jaundiced.     Findings: No lesion or rash.  Neurological:     General: No focal deficit present.     Mental Status: She is alert and oriented to person, place, and time. Mental status is at baseline.  Psychiatric:        Mood and Affect: Mood normal.        Behavior: Behavior normal.        Thought Content: Thought content normal.   LABS:       Latest Ref Rng & Units 11/08/2022   12:00 AM 07/20/2022    2:16 PM 06/19/2022    3:00 PM  CMP  Glucose 70 - 99 mg/dL  98  84   BUN 4 - 21 46     42  41   Creatinine 0.5 - 1.1 1.5     1.82  1.95   Sodium 137 - 147 143     140  142   Potassium 3.5 - 5.1 mEq/L 4.3     4.9  5.5  Chloride 99 - 108 110     103  101   CO2 13 - '22 23     23  25   '$ Calcium 8.7 - 10.7 9.2     9.4  9.8   Alkaline Phos 25 - 125 55        AST 13 - 35 98        ALT 7 - 35 U/L 89           This result is from an external source.    Latest Reference Range & Units 11/08/22 14:52  Iron 28 - 170 ug/dL 62  UIBC ug/dL 296  TIBC 250 - 450 ug/dL 358  Saturation Ratios 10.4 - 31.8 % 17  Ferritin 11 - 307 ng/mL 194  Folate >5.9 ng/mL 39.6  Vitamin B12 180 - 914 pg/mL 6,447 (H)  (H): Data is abnormally high  Latest Reference Range & Units 11/08/22 14:52  Total Protein ELP 6.0 - 8.5 g/dL 6.9  Albumin ELP 2.9 - 4.4 g/dL 3.7  Globulin, Total 2.2 - 3.9 g/dL 3.2 (C)  A/G Ratio 0.7 - 1.7  1.2 (C)  Alpha-1-Globulin 0.0 - 0.4 g/dL 0.3  Alpha-2-Globulin 0.4 - 1.0 g/dL 0.6  Beta Globulin 0.7 - 1.3 g/dL 0.9  Gamma Globulin 0.4 - 1.8 g/dL 1.4  M-SPIKE, % Not Observed g/dL Not Observed  (C): Corrected  ASSESSMENT & PLAN:  A 89 y.o. female who I was asked to consult upon for anemia.  When evaluating her labs, this patient does have chronic renal insufficiency, which appears to be the only significant  reason behind her anemia.  There appears to be no type of nutritional deficiency.  Furthermore, she does not appear to have an underlying plasma cell dyscrasia based upon her normal serum protein electrophoresis.  Based upon this, I will arrange for her to start receiving monthly Retacrit 20,000 units injections with the goal being to get her hemoglobin to/above 10.  Her CBC will be checked monthly to see how well she is responding to these injections.  I will see her back in 3 months for repeat clinical assessment.  The patient understands all the plans discussed today and is in agreement with them.  I do appreciate Angelina Sheriff, MD for his new consult.   Carman Auxier Macarthur Critchley, MD

## 2022-11-22 ENCOUNTER — Encounter: Payer: Self-pay | Admitting: Cardiology

## 2022-11-22 ENCOUNTER — Inpatient Hospital Stay (INDEPENDENT_AMBULATORY_CARE_PROVIDER_SITE_OTHER): Payer: Medicare Other | Admitting: Oncology

## 2022-11-22 ENCOUNTER — Telehealth: Payer: Self-pay | Admitting: Oncology

## 2022-11-22 VITALS — BP 178/74 | HR 60 | Temp 97.4°F | Resp 14 | Ht 60.0 in | Wt 102.5 lb

## 2022-11-22 DIAGNOSIS — N183 Chronic kidney disease, stage 3 unspecified: Secondary | ICD-10-CM | POA: Diagnosis not present

## 2022-11-22 DIAGNOSIS — J90 Pleural effusion, not elsewhere classified: Secondary | ICD-10-CM | POA: Diagnosis not present

## 2022-11-22 DIAGNOSIS — M1A0790 Idiopathic chronic gout, unspecified ankle and foot, without tophus (tophi): Secondary | ICD-10-CM | POA: Diagnosis not present

## 2022-11-22 DIAGNOSIS — I130 Hypertensive heart and chronic kidney disease with heart failure and stage 1 through stage 4 chronic kidney disease, or unspecified chronic kidney disease: Secondary | ICD-10-CM | POA: Diagnosis not present

## 2022-11-22 DIAGNOSIS — N189 Chronic kidney disease, unspecified: Secondary | ICD-10-CM

## 2022-11-22 DIAGNOSIS — M2042 Other hammer toe(s) (acquired), left foot: Secondary | ICD-10-CM | POA: Diagnosis not present

## 2022-11-22 DIAGNOSIS — M2041 Other hammer toe(s) (acquired), right foot: Secondary | ICD-10-CM | POA: Diagnosis not present

## 2022-11-22 DIAGNOSIS — D631 Anemia in chronic kidney disease: Secondary | ICD-10-CM | POA: Diagnosis not present

## 2022-11-22 DIAGNOSIS — N179 Acute kidney failure, unspecified: Secondary | ICD-10-CM | POA: Diagnosis not present

## 2022-11-22 DIAGNOSIS — I5031 Acute diastolic (congestive) heart failure: Secondary | ICD-10-CM | POA: Diagnosis not present

## 2022-11-22 DIAGNOSIS — M216X2 Other acquired deformities of left foot: Secondary | ICD-10-CM | POA: Diagnosis not present

## 2022-11-22 NOTE — Telephone Encounter (Signed)
Patient has been scheduled for follow-up visit per 11/22/22 LOS.  Pt given an appt calendar with date and time.

## 2022-11-23 DIAGNOSIS — I5031 Acute diastolic (congestive) heart failure: Secondary | ICD-10-CM | POA: Diagnosis not present

## 2022-11-23 DIAGNOSIS — I130 Hypertensive heart and chronic kidney disease with heart failure and stage 1 through stage 4 chronic kidney disease, or unspecified chronic kidney disease: Secondary | ICD-10-CM | POA: Diagnosis not present

## 2022-11-23 DIAGNOSIS — D631 Anemia in chronic kidney disease: Secondary | ICD-10-CM | POA: Diagnosis not present

## 2022-11-23 DIAGNOSIS — J90 Pleural effusion, not elsewhere classified: Secondary | ICD-10-CM | POA: Diagnosis not present

## 2022-11-23 DIAGNOSIS — N179 Acute kidney failure, unspecified: Secondary | ICD-10-CM | POA: Diagnosis not present

## 2022-11-23 DIAGNOSIS — N183 Chronic kidney disease, stage 3 unspecified: Secondary | ICD-10-CM | POA: Diagnosis not present

## 2022-11-25 ENCOUNTER — Encounter: Payer: Self-pay | Admitting: Oncology

## 2022-11-26 ENCOUNTER — Encounter: Payer: Self-pay | Admitting: Oncology

## 2022-11-26 DIAGNOSIS — I5030 Unspecified diastolic (congestive) heart failure: Secondary | ICD-10-CM | POA: Diagnosis not present

## 2022-11-26 DIAGNOSIS — M109 Gout, unspecified: Secondary | ICD-10-CM | POA: Diagnosis not present

## 2022-11-26 DIAGNOSIS — J90 Pleural effusion, not elsewhere classified: Secondary | ICD-10-CM | POA: Diagnosis not present

## 2022-11-26 DIAGNOSIS — N179 Acute kidney failure, unspecified: Secondary | ICD-10-CM | POA: Diagnosis not present

## 2022-11-26 DIAGNOSIS — Z6821 Body mass index (BMI) 21.0-21.9, adult: Secondary | ICD-10-CM | POA: Diagnosis not present

## 2022-11-26 DIAGNOSIS — N183 Chronic kidney disease, stage 3 unspecified: Secondary | ICD-10-CM | POA: Diagnosis not present

## 2022-11-26 DIAGNOSIS — D631 Anemia in chronic kidney disease: Secondary | ICD-10-CM | POA: Diagnosis not present

## 2022-11-26 DIAGNOSIS — I130 Hypertensive heart and chronic kidney disease with heart failure and stage 1 through stage 4 chronic kidney disease, or unspecified chronic kidney disease: Secondary | ICD-10-CM | POA: Diagnosis not present

## 2022-11-26 DIAGNOSIS — I10 Essential (primary) hypertension: Secondary | ICD-10-CM | POA: Diagnosis not present

## 2022-11-26 DIAGNOSIS — I5031 Acute diastolic (congestive) heart failure: Secondary | ICD-10-CM | POA: Diagnosis not present

## 2022-11-26 DIAGNOSIS — Z0000 Encounter for general adult medical examination without abnormal findings: Secondary | ICD-10-CM | POA: Diagnosis not present

## 2022-11-27 ENCOUNTER — Other Ambulatory Visit: Payer: Self-pay | Admitting: Pharmacist

## 2022-11-28 ENCOUNTER — Inpatient Hospital Stay: Payer: Medicare Other

## 2022-11-28 ENCOUNTER — Encounter: Payer: Self-pay | Admitting: Oncology

## 2022-11-28 VITALS — BP 155/50 | HR 66 | Temp 98.1°F | Resp 18 | Ht 60.0 in | Wt 104.1 lb

## 2022-11-28 DIAGNOSIS — D631 Anemia in chronic kidney disease: Secondary | ICD-10-CM

## 2022-11-28 DIAGNOSIS — N183 Chronic kidney disease, stage 3 unspecified: Secondary | ICD-10-CM | POA: Diagnosis not present

## 2022-11-28 MED ORDER — EPOETIN ALFA-EPBX 20000 UNIT/ML IJ SOLN
20000.0000 [IU] | Freq: Once | INTRAMUSCULAR | Status: AC
  Administered 2022-11-28: 20000 [IU] via SUBCUTANEOUS
  Filled 2022-11-28: qty 1

## 2022-11-28 NOTE — Patient Instructions (Signed)

## 2022-11-29 DIAGNOSIS — I10 Essential (primary) hypertension: Secondary | ICD-10-CM | POA: Diagnosis not present

## 2022-11-29 DIAGNOSIS — I509 Heart failure, unspecified: Secondary | ICD-10-CM | POA: Diagnosis not present

## 2022-11-29 DIAGNOSIS — N189 Chronic kidney disease, unspecified: Secondary | ICD-10-CM | POA: Diagnosis not present

## 2022-11-30 DIAGNOSIS — I5031 Acute diastolic (congestive) heart failure: Secondary | ICD-10-CM | POA: Diagnosis not present

## 2022-11-30 DIAGNOSIS — D631 Anemia in chronic kidney disease: Secondary | ICD-10-CM | POA: Diagnosis not present

## 2022-11-30 DIAGNOSIS — J90 Pleural effusion, not elsewhere classified: Secondary | ICD-10-CM | POA: Diagnosis not present

## 2022-11-30 DIAGNOSIS — N183 Chronic kidney disease, stage 3 unspecified: Secondary | ICD-10-CM | POA: Diagnosis not present

## 2022-11-30 DIAGNOSIS — N179 Acute kidney failure, unspecified: Secondary | ICD-10-CM | POA: Diagnosis not present

## 2022-11-30 DIAGNOSIS — I130 Hypertensive heart and chronic kidney disease with heart failure and stage 1 through stage 4 chronic kidney disease, or unspecified chronic kidney disease: Secondary | ICD-10-CM | POA: Diagnosis not present

## 2022-12-03 DIAGNOSIS — D631 Anemia in chronic kidney disease: Secondary | ICD-10-CM | POA: Diagnosis not present

## 2022-12-03 DIAGNOSIS — N179 Acute kidney failure, unspecified: Secondary | ICD-10-CM | POA: Diagnosis not present

## 2022-12-03 DIAGNOSIS — I5031 Acute diastolic (congestive) heart failure: Secondary | ICD-10-CM | POA: Diagnosis not present

## 2022-12-03 DIAGNOSIS — J90 Pleural effusion, not elsewhere classified: Secondary | ICD-10-CM | POA: Diagnosis not present

## 2022-12-03 DIAGNOSIS — N183 Chronic kidney disease, stage 3 unspecified: Secondary | ICD-10-CM | POA: Diagnosis not present

## 2022-12-03 DIAGNOSIS — I130 Hypertensive heart and chronic kidney disease with heart failure and stage 1 through stage 4 chronic kidney disease, or unspecified chronic kidney disease: Secondary | ICD-10-CM | POA: Diagnosis not present

## 2022-12-05 DIAGNOSIS — D631 Anemia in chronic kidney disease: Secondary | ICD-10-CM | POA: Diagnosis not present

## 2022-12-05 DIAGNOSIS — J90 Pleural effusion, not elsewhere classified: Secondary | ICD-10-CM | POA: Diagnosis not present

## 2022-12-05 DIAGNOSIS — N183 Chronic kidney disease, stage 3 unspecified: Secondary | ICD-10-CM | POA: Diagnosis not present

## 2022-12-05 DIAGNOSIS — I5031 Acute diastolic (congestive) heart failure: Secondary | ICD-10-CM | POA: Diagnosis not present

## 2022-12-05 DIAGNOSIS — N179 Acute kidney failure, unspecified: Secondary | ICD-10-CM | POA: Diagnosis not present

## 2022-12-05 DIAGNOSIS — I130 Hypertensive heart and chronic kidney disease with heart failure and stage 1 through stage 4 chronic kidney disease, or unspecified chronic kidney disease: Secondary | ICD-10-CM | POA: Diagnosis not present

## 2022-12-06 ENCOUNTER — Telehealth: Payer: Self-pay | Admitting: Cardiology

## 2022-12-06 DIAGNOSIS — I5031 Acute diastolic (congestive) heart failure: Secondary | ICD-10-CM | POA: Diagnosis not present

## 2022-12-06 DIAGNOSIS — J90 Pleural effusion, not elsewhere classified: Secondary | ICD-10-CM | POA: Diagnosis not present

## 2022-12-06 DIAGNOSIS — I130 Hypertensive heart and chronic kidney disease with heart failure and stage 1 through stage 4 chronic kidney disease, or unspecified chronic kidney disease: Secondary | ICD-10-CM | POA: Diagnosis not present

## 2022-12-06 DIAGNOSIS — N183 Chronic kidney disease, stage 3 unspecified: Secondary | ICD-10-CM | POA: Diagnosis not present

## 2022-12-06 DIAGNOSIS — N179 Acute kidney failure, unspecified: Secondary | ICD-10-CM | POA: Diagnosis not present

## 2022-12-06 DIAGNOSIS — D631 Anemia in chronic kidney disease: Secondary | ICD-10-CM | POA: Diagnosis not present

## 2022-12-06 NOTE — Telephone Encounter (Signed)
Pt c/o medication issue:  1. Name of Medication: hydrALAZINE (APRESOLINE) 25 MG tablet   2. How are you currently taking this medication (dosage and times per day)? 3 x daily   3. Are you having a reaction (difficulty breathing--STAT)? No   4. What is your medication issue? [facility] is calling to let cardiologist know that patient is taking this med 3x daily w/o taking bp readings. FYI call before her upcoming appt.

## 2022-12-08 NOTE — Progress Notes (Unsigned)
Cardiology Office Note:    Date:  12/08/2022   ID:  Kaitlin Villarreal, DOB [birth year], MRN [id number]  PCP:  Angelina Sheriff, MD  Cardiologist:  Shirlee More, MD    Referring MD: Angelina Sheriff, MD    ASSESSMENT:    No diagnosis found. PLAN:    In order of problems listed above:  ***   Next appointment: ***   Medication Adjustments/Labs and Tests Ordered: Current medicines are reviewed at length with the patient today.  Concerns regarding medicines are outlined above.  No orders of the defined types were placed in this encounter.  No orders of the defined types were placed in this encounter.   No chief complaint on file.   History of Present Illness:    Kaitlin Villarreal is a 89 y.o. female with a hx of severe symptomatic aortic stenosis with TAVR June 2019 permanent pacemaker following TAVR for heart block hypertensive heart disease with heart failure and chronic kidney disease gout and gouty arthritis last seen 06/19/2022.  She was recently seen by hematology 11/08/2022 for anemia with chronic kidney disease.   She was seen in [facility] ED 11/14/2022 with weakness.  Notation she was seen before in the emergency room the day prior with heart failure and given a dose of IV diuretic laboratory studies showed a hemoglobin of 10.8 creatinine 1.70 GFR stage IV CKD creatinine and GFR 1.728 cc/min potassium 4.4 BNP level the day prior was recorded at 7690 the EKG shows sinus rhythm minor nonspecific ST abnormality.   She had an EKG performed in the ED showing sinus rhythm nonspecific ST and T abnormality personally reviewed chest x-ray showed interstitial prominence with small effusions.  Compliance with diet, lifestyle and medications: *** Past Medical History:  Diagnosis Date   Acute on chronic diastolic heart failure ([place]) 03/18/2018   Aortic stenosis 10/14/2014   Formatting of this note might be different from the original.  moderate by echo 11/2015   Arthritis     Bilateral renal masses 07/26/2017   Bone spur of toe of left foot    Chronic diastolic (congestive) heart failure (HCC)    Chronic diastolic heart failure ([place]) 03/18/2018   CKD (chronic kidney disease) stage 3, GFR 30-59 ml/min (HCC) 07/26/2017   Essential hypertension    GERD (gastroesophageal reflux disease)    HLD (hyperlipidemia)    Hyperlipidemia 11/25/2016   Pacemaker 09/02/2018   Pancreatic lesion 04/17/2018   Pancreatic mass    a. 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 03/2018: s/p TAVR by Burt Knack and Dr. Roxy Manns   Stage 3 chronic kidney disease ([place])    TIA (transient ischemic attack)     a. 1992   VSD (ventricular septal defect)     Past Surgical History:  Procedure Laterality Date   ABDOMINAL HYSTERECTOMY     APPENDECTOMY     HERNIA REPAIR     INTRAOPERATIVE TRANSTHORACIC ECHOCARDIOGRAM N/A 03/18/2018   Procedure: INTRAOPERATIVE TRANSTHORACIC ECHOCARDIOGRAM;  Surgeon: Sherren Mocha, MD;  Location: [place];  Service: Open Heart Surgery;  Laterality: N/A;   PACEMAKER IMPLANT N/A 03/20/2018   Procedure: PACEMAKER IMPLANT;  Surgeon: Constance Haw, MD;  Location: [place] [facility];  Service: Cardiovascular;  Laterality: N/A;   RIGHT HEART CATH N/A 03/20/2018   Procedure: RIGHT HEART CATH;  Surgeon: Sherren Mocha, MD;  Location: [place] [facility];  Service: Cardiovascular;  Laterality: N/A;   RIGHT/LEFT HEART CATH AND CORONARY ANGIOGRAPHY N/A 02/06/2018   Procedure: RIGHT/LEFT HEART CATH AND CORONARY ANGIOGRAPHY;  Surgeon: Sherren Mocha, MD;  Location: [facility] [place] [facility];  Service: Cardiovascular;  Laterality: N/A;   TONSILLECTOMY     TRANSCATHETER AORTIC VALVE REPLACEMENT, TRANSFEMORAL N/A 03/18/2018   Procedure:  TRANSCATHETER AORTIC VALVE REPLACEMENT, TRANSFEMORAL;  Surgeon: Sherren Mocha, MD;  Location: [place];  Service: Open Heart Surgery;  Laterality: N/A;   WISDOM TOOTH EXTRACTION      Current Medications: No outpatient medications have been marked as taking for the 12/10/22 encounter (Appointment) with Richardo Priest, MD.     Allergies:   Clarithromycin, Latex, Levofloxacin, Azithromycin, Diphenhydramine hcl, and Ivp dye [iodinated contrast media]   Social History   Socioeconomic History   Marital status: Married    Spouse name: Not on file   Number of children: Not on file   Years of education: Not on file   Highest education level: Not on file  Occupational History   Not on file  Tobacco Use   Smoking status: Never    Passive exposure: Never   Smokeless tobacco: Never  Vaping Use   Vaping Use: Never used  Substance and Sexual Activity   Alcohol use: No   Drug use: No   Sexual activity: Not on file  Other Topics Concern   Not on file  Social History Narrative   Not on file   Social Determinants of Health   Financial Resource Strain: Not on file  Food Insecurity: No Food Insecurity (11/08/2022)   Hunger Vital Sign    Worried About Running Out of Food in the Last Year: Never true    Ran Out of Food in the Last Year: Never true  Transportation Needs: No Transportation Needs (11/08/2022)   PRAPARE - Hydrologist (Medical): No    Lack of Transportation (Non-Medical): No  Physical Activity: Not on file  Stress: Not on file  Social Connections: Not on file     Family History: The patient's ***family history includes Congenital heart disease in her brother; Heart attack in her brother; Heart disease in her mother; Uterine cancer in her sister. ROS:   Please see the history of present illness.    All other systems reviewed and are negative.  EKGs/Labs/Other Studies Reviewed:    The following studies were reviewed today:  Cardiac Studies &  Procedures   CARDIAC CATHETERIZATION  CARDIAC CATHETERIZATION 03/20/2018  Narrative 1.  No significant left to right shunting based on absence of a significant O2 step up from the SVC to the pulmonary artery 2.  Elevated right and left heart intracardiac filling pressures consistent with acute on chronic diastolic heart failure  Plan: Serial echo follow-up of the patient's small perimembranous VSD, IV diuresis and medical therapy for treatment of congestive heart failure, permanent pacemaker placement per Dr. Curt Bears to follow   [place] 02/06/2018  Narrative  Ost 1st Diag lesion is 50% stenosed.  Prox Cx lesion is 40% stenosed.  There is severe aortic valve stenosis.  1.  Calcified coronary arteries without significant stenosis.  Mild proximal left circumflex stenosis, minimal irregularity of the RCA, minimal irregularity of the LAD and moderate stenosis of an ostial diagonal 2.  Severely  calcified aortic valve with known severe aortic stenosis by noninvasive assessment 3.  Normal right heart hemodynamics with preserved cardiac output and normal intracardiac filling pressures  Recommendations: Continue multidisciplinary evaluation for TAVR.  Medical therapy for mild nonobstructive CAD.  Findings Coronary Findings Diagnostic  Dominance: Right  Left Main There is mild diffuse disease throughout the vessel. The vessel is moderately calcified. The left main is calcified with no significant stenosis  Left Anterior Descending The vessel exhibits minimal luminal irregularities. The LAD is patent throughout.  The distal vessel has multiple angulated segments but there are no stenoses.  The origin of the first diagonal branch has 50% stenosis.  First Diagonal Branch Ost 1st Diag lesion is 50% stenosed.  Left Circumflex There is mild diffuse disease throughout the vessel. Prox Cx lesion is 40% stenosed. The lesion is moderately  calcified.  First Obtuse Marginal Branch Vessel is moderate in size. There is mild disease in the vessel.  Second Obtuse Marginal Branch There is mild disease in the vessel.  Right Coronary Artery Vessel is moderate in size. There is mild diffuse disease throughout the vessel. The origin of the RCA is calcified around the right cusp.  The vessel has moderate diffuse calcification.  The vessel is widely patent with minor diffuse irregularities noted but no significant stenosis.  Intervention  No interventions have been documented.   STRESS TESTS  ECHOCARDIOGRAM STRESS TEST 01/08/2018  Narrative *[facility]* [address] [place], [place] [address] [phone]  ------------------------------------------------------------------- Stress Echocardiography  Patient:    Lamekia, Leppek MR #:       [id number] Study Date: 01/08/2018 Gender:     F Age:        23 Height:     152.4 cm Weight:     54.5 kg BSA:        1.53 m^2 Pt. Status: Room:  ATTENDING    Default, Provider [phone] [place], MD [place], MD REFERRING    Redding Ii, [place], [place] SONOGRAPHER  Cardell Peach, RDCS  cc:  -------------------------------------------------------------------  ------------------------------------------------------------------- Indications:      Aortic stenosis 424.1.  ------------------------------------------------------------------- History:   PMH:   Murmur.  Aortic valve disease.  Risk factors: Hypertension.  ------------------------------------------------------------------- Study Conclusions  - Aortic valve: Valve area (VTI): 0.7 cm^2. Valve area (Vmax): 0.76 cm^2. Valve area (Vmean): 0.76 cm^2. - Stress: There was a normal resting blood pressure with a hypotensive response to stress. Functional capacity was decreased (greater than 40%). - Stress ECG conclusions: There were no stress arrhythmias  or conduction abnormalities. The stress ECG was non-diagnostic.  ------------------------------------------------------------------- Labs, prior tests, procedures, and surgery: ECG.     Abnormal.  ------------------------------------------------------------------- Study data:   Study status:  Routine.  Consent:  The risks, benefits, and alternatives to the procedure were explained to the patient and informed consent was obtained.  Procedure:  Initial setup. The patient was brought to the laboratory. A baseline ECG was recorded. Surface ECG leads and automatic cuff blood pressure measurements were monitored. Treadmill exercise testing was performed using the modified Bruce protocol. The patient exercised for 4.5 min, to a maximal work rate of 3.4 mets. Exercise was terminated due to fatigue. Transthoracic stress echocardiography for assessment of valvular function. Image quality was adequate. Images were captured at baseline and peak exercise.  Study completion:  The patient tolerated the procedure well. There were no complications.  Modified Bruce protocol. Stress echocardiography.  Birthdate:  Patient birthdate: 01-May-1926.  Age: Patient is 89 yr old.  Sex:  Gender: female.    BMI: 23.5 kg/m^2. Blood pressure:     181/72  Patient status:  Outpatient.  Study date:  Study date: 01/08/2018. Study time: 11:35 AM.  -------------------------------------------------------------------  ------------------------------------------------------------------- Aortic valve:  Resting gradient 56mHG.  Post stress: Peak stress velocity 376 cm/s. mean velocity 297cm/s. Mean gradient 39 mmHG.  Doppler:     VTI ratio of LVOT to aortic valve: 0.22. Valve area (VTI): 0.7 cm^2. Indexed valve area (VTI): 0.46 cm^2/m^2. Peak velocity ratio of LVOT to aortic valve: 0.24. Valve area (Vmax): 0.76 cm^2. Indexed valve area (Vmax): 0.5 cm^2/m^2. Mean velocity ratio of LVOT to aortic valve: 0.24.  Valve area (Vmean): 0.76 cm^2. Indexed valve area (Vmean): 0.5 cm^2/m^2. Resting mean gradient (S): 44 mm Hg. Peak gradient (S): 75 mm Hg.  ------------------------------------------------------------------- Mitral valve:   Doppler:     Peak gradient (D): 9 mm Hg.  ------------------------------------------------------------------- Baseline ECG:  Normal.  Normal sinus rhythm.  Nonspecific ST changes.  ------------------------------------------------------------------- Stress protocol:  +---------------------+---+------------+--------+ !Stage                !HR !BP (mmHg)   !Symptoms! +---------------------+---+------------+--------+ !Baseline             !70 !181/72 (108)!None    ! +---------------------+---+------------+--------+ !Stage 0              !105!------------!--------! +---------------------+---+------------+--------+ !Stage 1/2            !116!------------!--------! +---------------------+---+------------+--------+ !Immediate post stress!113!------------!--------! +---------------------+---+------------+--------+ !Recovery; 1 min      !88 !------------!--------! +---------------------+---+------------+--------+ !Recovery; 2 min      !82 !------------!--------! +---------------------+---+------------+--------+  ------------------------------------------------------------------- Stress results:   Maximal heart rate during stress was 116 bpm (89% of maximal predicted heart rate). The maximal predicted heart rate was 131 bpm.The target heart rate was achieved. The heart rate response to stress was normal. There was a normal resting blood pressure with a hypotensive response to stress. The rate-pressure product for the peak heart rate and blood pressure was 12670 mm Hg/min.  The patient experienced no chest pain during stress. Functional capacity was decreased (greater than 40%).  ------------------------------------------------------------------- Stress ECG:  There  were no stress arrhythmias or conduction abnormalities.  The stress ECG was non-diagnostic.  ------------------------------------------------------------------- Measurements  Left ventricle                            Value          Reference LV ID, ED, PLAX chordal           (L)     33.4  mm       43 - 52 LV ID, ES, PLAX chordal           (L)     17.9  mm       23 - 38 LV fx shortening, PLAX chordal            46    %        >=29 LV PW thickness, ED                       10.2  mm       --------- IVS/LV PW ratio, ED               (H)     1.49           <=  1.3 Stroke volume, 2D                         84    ml       --------- Stroke volume/bsa, 2D                     55    ml/m^2   ---------  Ventricular septum                        Value          Reference IVS thickness, ED                         15.2  mm       ---------  LVOT                                      Value          Reference LVOT ID, S                                20    mm       --------- LVOT area                                 3.14  cm^2     --------- LVOT peak velocity, S                     105   cm/s     --------- LVOT mean velocity, S                     75.8  cm/s     --------- LVOT VTI, S                               26.8  cm       ---------  Aortic valve                              Value          Reference Aortic valve peak velocity, S             434   cm/s     --------- Aortic valve mean velocity, S             312   cm/s     --------- Aortic valve VTI, S                       121   cm       --------- Aortic mean gradient, S                   40    mm Hg    --------- Aortic peak gradient, S                   75    mm Hg    --------- VTI ratio, LVOT/AV  0.22           --------- Aortic valve area, VTI                    0.7   cm^2     --------- Aortic valve area/bsa, VTI                0.46  cm^2/m^2 --------- Velocity ratio, peak, LVOT/AV             0.24           --------- Aortic  valve area, peak velocity          0.76  cm^2     --------- Aortic valve area/bsa, peak               0.5   cm^2/m^2 --------- velocity Velocity ratio, mean, LVOT/AV             0.24           --------- Aortic valve area, mean velocity          0.76  cm^2     --------- Aortic valve area/bsa, mean               0.5   cm^2/m^2 --------- velocity  Aorta                                     Value          Reference Aortic root ID, ED                        27    mm       ---------  Left atrium                               Value          Reference LA ID, A-P, ES                            36    mm       --------- LA ID/bsa, A-P                    (H)     2.36  cm/m^2   <=2.2  Mitral valve                              Value          Reference Mitral E-wave peak velocity               147   cm/s     --------- Mitral A-wave peak velocity               118   cm/s     --------- Mitral deceleration time                  194   ms       150 - 230 Mitral peak gradient, D                   9     mm Hg    --------- Mitral E/A ratio, peak  1.2            ---------  Legend: (L)  and  (H)  mark values outside specified reference range.  ------------------------------------------------------------------- Prepared and Electronically Authenticated by  Shirlee More, MD 2019-04-10T13:52:39   ECHOCARDIOGRAM  ECHOCARDIOGRAM COMPLETE 08/13/2019  Narrative ECHOCARDIOGRAM REPORT    Patient Name:   KRYS FUSSELMAN Whitehair   Date of Exam: 08/12/2019 Medical Rec #:  [id number]     Height:       60.0 in Accession #:    [id number]    Weight:       116.0 lb Date of Birth:  [birth year]     BSA:          1.48 m Patient Age:    5 years      BP:           130/50 mmHg Patient Gender: F             HR:           73 bpm. Exam Location:  [place]  Procedure: 2D Echo, Cardiac Doppler and Color Doppler  Indications:     Z95.2 Post TAVR evaluation  History:         Patient has prior history of  Echocardiogram examinations, most recent 02/04/2019. TAVR. Hypertensive heart disease. 3rd degree AV block. VSD. Chronic kidney disease.  Sonographer:     Diamond Nickel RCS Referring Phys:  [id number] Woodfin Ganja THOMPSON Diagnosing Phys: Mertie Moores MD  IMPRESSIONS   1. Left ventricular ejection fraction, by visual estimation, is 60 to 65%. The left ventricle has normal function. There is mildly increased left ventricular hypertrophy. 2. There is a small VSD with left to right shunting present. 3. Global right ventricle has normal systolic function.The right ventricular size is normal. No increase in right ventricular wall thickness. 4. Left atrial size was normal. 5. Right atrial size was normal. 6. The mitral valve is normal in structure. Mild mitral valve regurgitation. Mild mitral stenosis. 7. The tricuspid valve is grossly normal. Tricuspid valve regurgitation mild-moderate. 8. Aortic valve regurgitation is not visualized. 9. The pulmonic valve was grossly normal. Pulmonic valve regurgitation is not visualized. 10. Severely elevated pulmonary artery systolic pressure. 11. The atrial septum is grossly normal.  FINDINGS Left Ventricle: Left ventricular ejection fraction, by visual estimation, is 60 to 65%. The left ventricle has normal function. There is mildly increased left ventricular hypertrophy. There is a small VSD with left to right shunting present.  Right Ventricle: The right ventricular size is normal. No increase in right ventricular wall thickness. Global RV systolic function is has normal systolic function. The tricuspid regurgitant velocity is 4.69 m/s, and with an assumed right atrial pressure of 10 mmHg, the estimated right ventricular systolic pressure is severely elevated at 98.0 mmHg.  Left Atrium: Left atrial size was normal in size.  Right Atrium: Right atrial size was normal in size  Pericardium: There is no evidence of pericardial effusion.  Mitral Valve: The  mitral valve is normal in structure. Mild mitral valve stenosis by observation. MV peak gradient, 14.0 mmHg. Mild mitral valve regurgitation.  Tricuspid Valve: The tricuspid valve is grossly normal. Tricuspid valve regurgitation mild-moderate.  Aortic Valve: The aortic valve has been repaired/replaced. Aortic valve regurgitation is not visualized. Aortic valve mean gradient measures 6.5 mmHg. Aortic valve peak gradient measures 13.4 mmHg. Aortic valve area, by VTI measures 2.36 cm. Homograft aortic valve valve is present in the aortic position.  Pulmonic Valve: The pulmonic  valve was grossly normal. Pulmonic valve regurgitation is not visualized.  Aorta: The aortic root and ascending aorta are structurally normal, with no evidence of dilitation.  IAS/Shunts: The atrial septum is grossly normal.    LEFT VENTRICLE PLAX 2D LVIDd:         3.50 cm  Diastology LVIDs:         1.90 cm  LV e' lateral:   8.49 cm/s LV PW:         1.20 cm  LV E/e' lateral: 18.7 LV IVS:        1.30 cm  LV e' medial:    5.87 cm/s LVOT diam:     2.00 cm  LV E/e' medial:  27.1 LV SV:         40 ml LV SV Index:   26.48 LVOT Area:     3.14 cm   RIGHT VENTRICLE RV Basal diam:  2.20 cm RV S prime:     12.40 cm/s TAPSE (M-mode): 1.9 cm RVSP:           91.0 mmHg  LEFT ATRIUM             Index       RIGHT ATRIUM           Index LA diam:        3.30 cm 2.23 cm/m  RA Pressure: 3.00 mmHg LA Vol (A2C):   50.4 ml 34.03 ml/m RA Area:     11.60 cm LA Vol (A4C):   28.9 ml 19.52 ml/m RA Volume:   23.80 ml  16.07 ml/m LA Biplane Vol: 38.7 ml 26.13 ml/m AORTIC VALVE AV Area (Vmax):    1.92 cm AV Area (Vmean):   1.97 cm AV Area (VTI):     2.36 cm AV Vmax:           183.00 cm/s AV Vmean:          119.500 cm/s AV VTI:            0.466 m AV Peak Grad:      13.4 mmHg AV Mean Grad:      6.5 mmHg LVOT Vmax:         112.00 cm/s LVOT Vmean:        74.900 cm/s LVOT VTI:          0.350 m LVOT/AV VTI ratio:  0.75  MITRAL VALVE                         TRICUSPID VALVE MV Area (PHT): 2.87 cm              TR Peak grad:   88.0 mmHg MV Peak grad:  14.0 mmHg             TR Vmax:        469.00 cm/s MV Mean grad:  6.0 mmHg              Estimated RAP:  3.00 mmHg MV Vmax:       1.87 m/s              RVSP:           91.0 mmHg MV Vmean:      115.0 cm/s MV VTI:        0.54 m                SHUNTS MV PHT:        76.56 msec  Systemic VTI:  0.35 m MV Decel Time: 264 msec              Systemic Diam: 2.00 cm MV E velocity: 159.00 cm/s 103 cm/s MV A velocity: 142.00 cm/s 70.3 cm/s MV E/A ratio:  1.12        1.5   Mertie Moores MD Electronically signed by Mertie Moores MD Signature Date/Time: 08/12/2019/5:28:06 PM    Final (Updated)   TEE  ECHO TEE 03/18/2018  Narrative *[facility]* [address], [place] [address] [phone]  ------------------------------------------------------------------- Transesophageal Echocardiography  Patient:    Janera, Brien MR #:       [id number] Study Date: 03/18/2018 Gender:     F Age:        44 Height:     152.4 cm Weight:     54.4 kg BSA:        1.53 m^2 Pt. Status: Room:       2H24C  ADMITTING    Darylene Price, M.D. ATTENDING    Sherren Mocha, MD ORDERING     Sherren Mocha, MD REFERRING    Sherren Mocha, MD PERFORMING   Ena Dawley, M.D. SONOGRAPHER  Roseanna Rainbow  cc:  ------------------------------------------------------------------- LV EF: 65% -   70%  ------------------------------------------------------------------- Indications:      Aortic stenosis 424.1.  ------------------------------------------------------------------- History:   Risk factors:  Hypertension. Dyslipidemia.  ------------------------------------------------------------------- Study Conclusions  - Left ventricle: Wall thickness was increased in a pattern of moderate LVH. Systolic function was vigorous. The  estimated ejection fraction was in the range of 65% to 70%. Wall motion was normal; there were no regional wall motion abnormalities. - Aortic valve: There was severe regurgitation. Valve area (VTI): 0.77 cm^2. Valve area (Vmax): 0.8 cm^2. Valve area (Vmean): 2.37 cm^2. - Mitral valve: There was mild regurgitation. - Left atrium: No evidence of thrombus in the atrial cavity or appendage. No evidence of thrombus in the atrial cavity or appendage. - Right atrium: No evidence of thrombus in the atrial cavity or appendage.  Impressions:  - This was a periprocedural echocardiogram during a TAVR procedure. TTE was switched to TEE for concern of a VSD and a follow up TEE was done 5 hours later.  Native aortic valve was severely thickened and calcified with severely restricted leaflet openings. Peak/mean transaortic gradients were 68/41 mmHg consistent with severe aortic stenosis. A 23 mm Edwards-SAPIEN 3 valve was successfully deployed in the aortic position. Post-deployment gradients were 12/4 mmHg. No paravalvular leak. A small perimembranous VSD was noted with small left to right shunting. A TEE probe was inserted and confirmed the diagnosis. There is trivial pericardial effusion. Mild mitral and tricuspid regurgitation.  5 hours later a limited TTE was performed. It showed unchanged small VSD, stable aortic valve with normal unchanged gradients. No paravalvular leak. Trivial pericardial effusion.  ------------------------------------------------------------------- Study data:   Study status:  Routine.  Consent:  The risks, benefits, and alternatives to the procedure were explained to the patient and informed consent was obtained.  Procedure:  Study started as a limited transthoracic, then TEE probe was inserted to evaluate for VSD. The patient reported no pain pre or post test. Initial setup. The patient was brought to the laboratory. Surface ECG leads were monitored. Sedation.  Conscious sedation was administered by anesthesiology staff. Transesophageal echocardiography. Topical anesthesia was obtained using viscous lidocaine. An adult multiplane transesophageal probe was inserted by the anesthesiologistwithout difficulty. Image quality was adequate.  Study completion:  The  patient tolerated the procedure well. There were no complications.          Diagnostic transesophageal echocardiography.  2D and color Doppler. Birthdate:  Patient birthdate: August 15, 1926.  Age:  Patient is 89 yr old.  Sex:  Gender: female.    BMI: 23.4 kg/m^2.  Blood pressure: 204/61  Patient status:  Inpatient.  Study date:  Study date: 03/18/2018. Study time: 07:34 AM.  Location:  Operating room.  -------------------------------------------------------------------  ------------------------------------------------------------------- Left ventricle:   Wall thickness was increased in a pattern of moderate LVH.   Systolic function was vigorous. The estimated ejection fraction was in the range of 65% to 70%. Wall motion was normal; there were no regional wall motion abnormalities.  ------------------------------------------------------------------- Aortic valve:   Trileaflet; severely thickened, severely calcified leaflets. Cusp separation was normal.  Doppler:  There was severe regurgitation.    VTI ratio of LVOT to aortic valve: 1.2. Valve area (VTI): 0.77 cm^2. Indexed valve area (VTI): 0.5 cm^2/m^2. Peak velocity ratio of LVOT to aortic valve: 0.92. Valve area (Vmax): 0.8 cm^2. Indexed valve area (Vmax): 0.52 cm^2/m^2. Mean velocity ratio of LVOT to aortic valve: 0.93. Valve area (Vmean): 2.37 cm^2. Indexed valve area (Vmean): 1.55 cm^2/m^2.    Mean gradient (S): 3 mm Hg. Peak gradient (S): 6 mm Hg.  ------------------------------------------------------------------- Aorta:  There was no atheroma. There was no evidence for dissection. Aortic root: The aortic root was not  dilated. Ascending aorta: The ascending aorta was normal in size. Aortic arch: The aortic arch was normal in size. Descending aorta: The descending aorta was normal in size.  ------------------------------------------------------------------- Mitral valve:   Mildly thickened leaflets . Leaflet separation was normal.  Doppler:  There was mild regurgitation.  ------------------------------------------------------------------- Left atrium:  The atrium was normal in size.  No evidence of thrombus in the atrial cavity or appendage.  No evidence of thrombus in the atrial cavity or appendage. The appendage was morphologically a left appendage, multilobulated, and of normal size. Emptying velocity was normal.  ------------------------------------------------------------------- Right ventricle:  The cavity size was normal. Wall thickness was normal. Systolic function was normal.  ------------------------------------------------------------------- Pulmonic valve:    Structurally normal valve.  ------------------------------------------------------------------- Tricuspid valve:   Structurally normal valve.   Leaflet separation was normal.  Doppler:  There was mild regurgitation.  ------------------------------------------------------------------- Pulmonary artery:   The main pulmonary artery was normal-sized.  ------------------------------------------------------------------- Right atrium:  The atrium was normal in size.  No evidence of thrombus in the atrial cavity or appendage. The appendage was morphologically a right appendage.  ------------------------------------------------------------------- Pericardium:  There was no pericardial effusion.  ------------------------------------------------------------------- Measurements  Left ventricle                            Value          Reference LV ID, ED, PLAX chordal           (L)     28.7  mm       43 - 52 LV ID, ES, PLAX chordal            (L)     19.9  mm       23 - 38 LV fx shortening, PLAX chordal            31    %        >=29 LV PW thickness, ED  14.3  mm       --------- IVS/LV PW ratio, ED                       0.93           <=1.3 Stroke volume, 2D                         76    ml       --------- Stroke volume/bsa, 2D                     50    ml/m^2   --------- LV end-diastolic volume, 1-p Q000111Q          44    ml       --------- LV ejection fraction, 1-p A4C             66    %        --------- LV end-diastolic volume/bsa, 1-p          29    ml/m^2   --------- [id number] LV end-diastolic volume, 2-p              48    ml       --------- LV end-systolic volume, 2-p               16    ml       --------- LV ejection fraction, 2-p                 68    %        --------- Stroke volume, 2-p                        32    ml       --------- LV end-diastolic volume/bsa, 2-p          31    ml/m^2   --------- LV end-systolic volume/bsa, 2-p           10    ml/m^2   --------- Stroke volume/bsa, 2-p                    21.1  ml/m^2   ---------  Ventricular septum                        Value          Reference IVS thickness, ED                         13.3  mm       ---------  LVOT                                      Value          Reference LVOT ID, S                                18    mm       --------- LVOT area  2.54  cm^2     --------- LVOT peak velocity, S                     109   cm/s     --------- LVOT mean velocity, S                     77.3  cm/s     --------- LVOT VTI, S                               30.1  cm       ---------  Aortic valve                              Value          Reference Aortic valve peak velocity, S             118   cm/s     --------- Aortic valve mean velocity, S             82.7  cm/s     --------- Aortic valve VTI, S                       25    cm       --------- Aortic mean gradient, S                   3     mm Hg     --------- Aortic peak gradient, S                   6     mm Hg    --------- VTI ratio, LVOT/AV                        1.2            --------- Aortic valve area, VTI                    0.77  cm^2     --------- Aortic valve area/bsa, VTI                0.5   cm^2/m^2 --------- Velocity ratio, peak, LVOT/AV             0.92           --------- Aortic valve area, peak velocity          0.8   cm^2     --------- Aortic valve area/bsa, peak               0.52  cm^2/m^2 --------- velocity Velocity ratio, mean, LVOT/AV             0.93           --------- Aortic valve area, mean velocity          2.37  cm^2     --------- Aortic valve area/bsa, mean               1.55  cm^2/m^2 --------- velocity  Aorta                                     Value  Reference Aortic root ID, ED                        27    mm       --------- Ascending aorta ID, A-P, S                29    mm       ---------  Left atrium                               Value          Reference LA ID, A-P, ES                            24    mm       --------- LA ID/bsa, A-P                            1.57  cm/m^2   <=2.2  Legend: (L)  and  (H)  mark values outside specified reference range.  ------------------------------------------------------------------- Prepared and Electronically Authenticated by  Ena Dawley, M.D. 2019-06-18T16:24:45    CT SCANS  CT CORONARY MORPH W/CTA COR W/SCORE 02/20/2018  Addendum 02/20/2018  6:32 PM ADDENDUM REPORT: 02/20/2018 18:29  CLINICAL DATA:  89 year old female with severe aortic stenosis being evaluated for a TAVR procedure.  EXAM: Cardiac TAVR CT  TECHNIQUE: The patient was scanned on a [employer]. A 120 kV retrospective scan was triggered in the descending thoracic aorta at 111 HU's. Gantry rotation speed was 250 msecs and collimation was .6 mm. No beta blockade or nitro were given. The 3D data set was reconstructed in 5% intervals of the R-R cycle.  Systolic and diastolic phases were analyzed on a dedicated work station using MPR, MIP and VRT modes. The patient received 80 cc of contrast.  FINDINGS: Aortic Valve: Trileaflet, severely thickened, moderately calcified aortic valve with mild calcifications extending asymmetrically into the LVOT under the right and left coronary cusps.  Aorta: Normal size with moderate diffuse calcifications and atherosclerosis, no dissection.  Sinotubular Junction: 24 x 24 mm  Ascending Thoracic Aorta: 29 x 29 mm  Aortic Arch: 23 x 23 mm  Descending Thoracic Aorta: 19 x 19 mm  Sinus of Valsalva Measurements:  Non-coronary: 31 mm  Right -coronary: 30 mm  Left -coronary: 29 mm  Sinus of Valsalva Height:  Right -coronary: 22 mm  Left -coronary: 21 mm  Coronary Artery Height above Annulus:  Left Main: 17 mm  Right Coronary: 18 mm  Virtual Basal Annulus Measurements:  Maximum/Minimum Diameter: 22.7 x 18.5 mm  Mean Diameter: 20.4 mm  Perimeter: 65.1 mm  Area: 326 mm2  Optimum Fluoroscopic Angle for Delivery: LAO 24 CAU 20  IMPRESSION: 1. Trileaflet, severely thickened, moderately calcified aortic valve with mild calcifications extending asymmetrically into the LVOT under the right and left coronary cusps. Annular measurements suitable for delivery of a 20 mm Edwards-SAPIEN 3 valve or a 26 mm Medtronic Evolut R valve.  2. Sufficient coronary to annulus distance.  3. Optimum Fluoroscopic Angle for Delivery: LAO 24 CAU 20  4. No thrombus in the left atrial appendage.   Electronically Signed By: Ena Dawley On: 02/20/2018 18:29  Narrative EXAM: OVER-READ INTERPRETATION  CT CHEST  The following report is an over-read performed by  radiologist Dr. Vinnie Langton of [facility], [place] on 02/20/2018. This over-read does not include interpretation of cardiac or coronary anatomy or pathology. The coronary calcium score/coronary CTA interpretation by the  cardiologist is attached.  COMPARISON:  None.  FINDINGS: Extracardiac findings will be described under separate dictation for contemporaneously obtained CTA chest, abdomen and pelvis.  IMPRESSION: Please see separate dictation for contemporaneously obtained CTA chest, abdomen and pelvis dated 02/20/2018 for full description of relevant extracardiac findings.  Electronically Signed: By: Vinnie Langton M.D. On: 02/20/2018 15:14          EKG:  EKG ordered today and personally reviewed.  The ekg ordered today demonstrates ***  Recent Labs: 11/08/2022: ALT 89; BUN 46; Creatinine 1.5; Hemoglobin 9.2; Platelets 177; Potassium 4.3; Sodium 143  Recent Lipid Panel    Component Value Date/Time   CHOL 217 (H) 04/10/2021 1357   TRIG 180 (H) 04/10/2021 1357   HDL 78 04/10/2021 1357   CHOLHDL 2.8 04/10/2021 1357   LDLCALC 108 (H) 04/10/2021 1357    Physical Exam:    VS:  There were no vitals taken for this visit.    Wt Readings from Last 3 Encounters:  11/28/22 104 lb 1.9 oz (47.2 kg)  11/22/22 102 lb 8 oz (46.5 kg)  11/08/22 104 lb 12.8 oz (47.5 kg)     GEN: *** Well nourished, well developed in no acute distress HEENT: Normal NECK: No JVD; No carotid bruits LYMPHATICS: No lymphadenopathy CARDIAC: ***RRR, no murmurs, rubs, gallops RESPIRATORY:  Clear to auscultation without rales, wheezing or rhonchi  ABDOMEN: Soft, non-tender, non-distended MUSCULOSKELETAL:  No edema; No deformity  SKIN: Warm and dry NEUROLOGIC:  Alert and oriented x 3 PSYCHIATRIC:  Normal affect    Signed, Shirlee More, MD  12/08/2022 12:49 PM    [facility]

## 2022-12-10 ENCOUNTER — Encounter: Payer: Self-pay | Admitting: Cardiology

## 2022-12-10 ENCOUNTER — Ambulatory Visit: Payer: Medicare Other | Attending: Cardiology | Admitting: Cardiology

## 2022-12-10 VITALS — BP 160/54 | Ht 60.0 in | Wt 101.4 lb

## 2022-12-10 DIAGNOSIS — N183 Chronic kidney disease, stage 3 unspecified: Secondary | ICD-10-CM | POA: Diagnosis not present

## 2022-12-10 DIAGNOSIS — Z950 Presence of cardiac pacemaker: Secondary | ICD-10-CM

## 2022-12-10 DIAGNOSIS — I130 Hypertensive heart and chronic kidney disease with heart failure and stage 1 through stage 4 chronic kidney disease, or unspecified chronic kidney disease: Secondary | ICD-10-CM

## 2022-12-10 DIAGNOSIS — I442 Atrioventricular block, complete: Secondary | ICD-10-CM

## 2022-12-10 DIAGNOSIS — Z952 Presence of prosthetic heart valve: Secondary | ICD-10-CM

## 2022-12-10 MED ORDER — FUROSEMIDE 20 MG PO TABS: 20.0000 mg | ORAL_TABLET | ORAL | 3 refills | Status: DC

## 2022-12-10 NOTE — Patient Instructions (Signed)
Medication Instructions:  Your physician has recommended you make the following change in your medication:   START: Furosemide 20 mg on M-W-F (Take a furosemide on the off day if weight is 100 lbs. Or grearter)  *If you need a refill on your cardiac medications before your next appointment, please call your pharmacy*   Lab Work: Your physician recommends that you return for lab work in:   Labs today: BMP, Pro BNP  If you have labs (blood work) drawn today and your tests are completely normal, you will receive your results only by: MyChart Message (if you have MyChart) OR A paper copy in the mail If you have any lab test that is abnormal or we need to change your treatment, we will call you to review the results.   Testing/Procedures: None   Follow-Up: At [facility], you and your health needs are our priority.  As part of our continuing mission to provide you with exceptional heart care, we have created designated Provider Care Teams.  These Care Teams include your primary Cardiologist (physician) and Advanced Practice Providers (APPs -  Physician Assistants and Nurse Practitioners) who all work together to provide you with the care you need, when you need it.  We recommend signing up for the patient portal called "MyChart".  Sign up information is provided on this After Visit Summary.  MyChart is used to connect with patients for Virtual Visits (Telemedicine).  Patients are able to view lab/test results, encounter notes, upcoming appointments, etc.  Non-urgent messages can be sent to your provider as well.   To learn more about what you can do with MyChart, go to [email or web address].    Your next appointment:   3 month(s)  Provider:   Venia Carbon, NP [facility])    Other Instructions None

## 2022-12-11 DIAGNOSIS — D631 Anemia in chronic kidney disease: Secondary | ICD-10-CM | POA: Diagnosis not present

## 2022-12-11 DIAGNOSIS — I130 Hypertensive heart and chronic kidney disease with heart failure and stage 1 through stage 4 chronic kidney disease, or unspecified chronic kidney disease: Secondary | ICD-10-CM | POA: Diagnosis not present

## 2022-12-11 DIAGNOSIS — N179 Acute kidney failure, unspecified: Secondary | ICD-10-CM | POA: Diagnosis not present

## 2022-12-11 DIAGNOSIS — I5031 Acute diastolic (congestive) heart failure: Secondary | ICD-10-CM | POA: Diagnosis not present

## 2022-12-11 DIAGNOSIS — J90 Pleural effusion, not elsewhere classified: Secondary | ICD-10-CM | POA: Diagnosis not present

## 2022-12-11 DIAGNOSIS — N183 Chronic kidney disease, stage 3 unspecified: Secondary | ICD-10-CM | POA: Diagnosis not present

## 2022-12-11 LAB — BASIC METABOLIC PANEL
BUN/Creatinine Ratio: 23 (ref 12–28)
BUN: 31 mg/dL (ref 10–36)
CO2: 22 mmol/L (ref 20–29)
Calcium: 9.8 mg/dL (ref 8.7–10.3)
Chloride: 103 mmol/L (ref 96–106)
Creatinine, Ser: 1.37 mg/dL — ABNORMAL HIGH (ref 0.57–1.00)
Glucose: 84 mg/dL (ref 70–99)
Potassium: 4.9 mmol/L (ref 3.5–5.2)
Sodium: 141 mmol/L (ref 134–144)
eGFR: 36 mL/min/{1.73_m2} — ABNORMAL LOW (ref >59)

## 2022-12-11 LAB — PRO B NATRIURETIC PEPTIDE: NT-Pro BNP: 4407 pg/mL — ABNORMAL HIGH (ref 0–738)

## 2022-12-14 DIAGNOSIS — J90 Pleural effusion, not elsewhere classified: Secondary | ICD-10-CM | POA: Diagnosis not present

## 2022-12-14 DIAGNOSIS — I5031 Acute diastolic (congestive) heart failure: Secondary | ICD-10-CM | POA: Diagnosis not present

## 2022-12-14 DIAGNOSIS — D631 Anemia in chronic kidney disease: Secondary | ICD-10-CM | POA: Diagnosis not present

## 2022-12-14 DIAGNOSIS — N183 Chronic kidney disease, stage 3 unspecified: Secondary | ICD-10-CM | POA: Diagnosis not present

## 2022-12-14 DIAGNOSIS — N179 Acute kidney failure, unspecified: Secondary | ICD-10-CM | POA: Diagnosis not present

## 2022-12-14 DIAGNOSIS — I130 Hypertensive heart and chronic kidney disease with heart failure and stage 1 through stage 4 chronic kidney disease, or unspecified chronic kidney disease: Secondary | ICD-10-CM | POA: Diagnosis not present

## 2022-12-17 DIAGNOSIS — Z556 Problems related to health literacy: Secondary | ICD-10-CM | POA: Diagnosis not present

## 2022-12-17 DIAGNOSIS — I2510 Atherosclerotic heart disease of native coronary artery without angina pectoris: Secondary | ICD-10-CM | POA: Diagnosis not present

## 2022-12-17 DIAGNOSIS — M810 Age-related osteoporosis without current pathological fracture: Secondary | ICD-10-CM | POA: Diagnosis not present

## 2022-12-17 DIAGNOSIS — Z950 Presence of cardiac pacemaker: Secondary | ICD-10-CM | POA: Diagnosis not present

## 2022-12-17 DIAGNOSIS — M1990 Unspecified osteoarthritis, unspecified site: Secondary | ICD-10-CM | POA: Diagnosis not present

## 2022-12-17 DIAGNOSIS — I739 Peripheral vascular disease, unspecified: Secondary | ICD-10-CM | POA: Diagnosis not present

## 2022-12-17 DIAGNOSIS — Z9181 History of falling: Secondary | ICD-10-CM | POA: Diagnosis not present

## 2022-12-17 DIAGNOSIS — I252 Old myocardial infarction: Secondary | ICD-10-CM | POA: Diagnosis not present

## 2022-12-17 DIAGNOSIS — I130 Hypertensive heart and chronic kidney disease with heart failure and stage 1 through stage 4 chronic kidney disease, or unspecified chronic kidney disease: Secondary | ICD-10-CM | POA: Diagnosis not present

## 2022-12-17 DIAGNOSIS — J90 Pleural effusion, not elsewhere classified: Secondary | ICD-10-CM | POA: Diagnosis not present

## 2022-12-17 DIAGNOSIS — E7800 Pure hypercholesterolemia, unspecified: Secondary | ICD-10-CM | POA: Diagnosis not present

## 2022-12-17 DIAGNOSIS — N183 Chronic kidney disease, stage 3 unspecified: Secondary | ICD-10-CM | POA: Diagnosis not present

## 2022-12-17 DIAGNOSIS — K589 Irritable bowel syndrome without diarrhea: Secondary | ICD-10-CM | POA: Diagnosis not present

## 2022-12-17 DIAGNOSIS — I272 Pulmonary hypertension, unspecified: Secondary | ICD-10-CM | POA: Diagnosis not present

## 2022-12-17 DIAGNOSIS — N179 Acute kidney failure, unspecified: Secondary | ICD-10-CM | POA: Diagnosis not present

## 2022-12-17 DIAGNOSIS — Z7951 Long term (current) use of inhaled steroids: Secondary | ICD-10-CM | POA: Diagnosis not present

## 2022-12-17 DIAGNOSIS — I700 Atherosclerosis of aorta: Secondary | ICD-10-CM | POA: Diagnosis not present

## 2022-12-17 DIAGNOSIS — Z952 Presence of prosthetic heart valve: Secondary | ICD-10-CM | POA: Diagnosis not present

## 2022-12-17 DIAGNOSIS — D631 Anemia in chronic kidney disease: Secondary | ICD-10-CM | POA: Diagnosis not present

## 2022-12-17 DIAGNOSIS — J9811 Atelectasis: Secondary | ICD-10-CM | POA: Diagnosis not present

## 2022-12-17 DIAGNOSIS — M109 Gout, unspecified: Secondary | ICD-10-CM | POA: Diagnosis not present

## 2022-12-17 DIAGNOSIS — Z7982 Long term (current) use of aspirin: Secondary | ICD-10-CM | POA: Diagnosis not present

## 2022-12-17 DIAGNOSIS — I071 Rheumatic tricuspid insufficiency: Secondary | ICD-10-CM | POA: Diagnosis not present

## 2022-12-17 DIAGNOSIS — I5031 Acute diastolic (congestive) heart failure: Secondary | ICD-10-CM | POA: Diagnosis not present

## 2022-12-17 DIAGNOSIS — J309 Allergic rhinitis, unspecified: Secondary | ICD-10-CM | POA: Diagnosis not present

## 2022-12-19 ENCOUNTER — Ambulatory Visit (INDEPENDENT_AMBULATORY_CARE_PROVIDER_SITE_OTHER): Payer: Medicare Other

## 2022-12-19 DIAGNOSIS — I442 Atrioventricular block, complete: Secondary | ICD-10-CM | POA: Diagnosis not present

## 2022-12-20 LAB — CUP PACEART REMOTE DEVICE CHECK
Battery Remaining Longevity: 79 mo
Battery Remaining Percentage: 63 %
Battery Voltage: 2.99 V
Brady Statistic AP VP Percent: 1 %
Brady Statistic AP VS Percent: 17 %
Brady Statistic AS VP Percent: 1 %
Brady Statistic AS VS Percent: 83 %
Brady Statistic RA Percent Paced: 17 %
Brady Statistic RV Percent Paced: 1 %
Date Time Interrogation Session: 20240320020012
Implantable Lead Connection Status: 753985
Implantable Lead Connection Status: 753985
Implantable Lead Implant Date: 20190620
Implantable Lead Implant Date: 20190620
Implantable Lead Location: 753859
Implantable Lead Location: 753860
Implantable Pulse Generator Implant Date: 20190620
Lead Channel Impedance Value: 350 Ohm
Lead Channel Impedance Value: 430 Ohm
Lead Channel Pacing Threshold Amplitude: 0.75 V
Lead Channel Pacing Threshold Amplitude: 0.875 V
Lead Channel Pacing Threshold Pulse Width: 0.5 ms
Lead Channel Pacing Threshold Pulse Width: 0.5 ms
Lead Channel Sensing Intrinsic Amplitude: 4.7 mV
Lead Channel Sensing Intrinsic Amplitude: 9.2 mV
Lead Channel Setting Pacing Amplitude: 1.125
Lead Channel Setting Pacing Amplitude: 2 V
Lead Channel Setting Pacing Pulse Width: 0.5 ms
Lead Channel Setting Sensing Sensitivity: 2 mV
Pulse Gen Model: 2272
Pulse Gen Serial Number: 9035930

## 2022-12-24 ENCOUNTER — Inpatient Hospital Stay: Payer: Medicare Other | Attending: Oncology

## 2022-12-24 ENCOUNTER — Ambulatory Visit: Payer: Medicare Other | Admitting: Cardiology

## 2022-12-24 DIAGNOSIS — D631 Anemia in chronic kidney disease: Secondary | ICD-10-CM | POA: Insufficient documentation

## 2022-12-24 DIAGNOSIS — N183 Chronic kidney disease, stage 3 unspecified: Secondary | ICD-10-CM | POA: Diagnosis not present

## 2022-12-24 LAB — CBC WITH DIFFERENTIAL (CANCER CENTER ONLY)
Abs Immature Granulocytes: 0.03 10*3/uL (ref 0.00–0.07)
Basophils Absolute: 0 10*3/uL (ref 0.0–0.1)
Basophils Relative: 0 %
Eosinophils Absolute: 0.1 10*3/uL (ref 0.0–0.5)
Eosinophils Relative: 1 %
HCT: 36.7 % (ref 36.0–46.0)
Hemoglobin: 11.5 g/dL — ABNORMAL LOW (ref 12.0–15.0)
Immature Granulocytes: 1 %
Lymphocytes Relative: 22 %
Lymphs Abs: 1.1 10*3/uL (ref 0.7–4.0)
MCH: 31.5 pg (ref 26.0–34.0)
MCHC: 31.3 g/dL (ref 30.0–36.0)
MCV: 100.5 fL — ABNORMAL HIGH (ref 80.0–100.0)
Monocytes Absolute: 0.4 10*3/uL (ref 0.1–1.0)
Monocytes Relative: 8 %
Neutro Abs: 3.5 10*3/uL (ref 1.7–7.7)
Neutrophils Relative %: 68 %
Platelet Count: 168 10*3/uL (ref 150–400)
RBC: 3.65 MIL/uL — ABNORMAL LOW (ref 3.87–5.11)
RDW: 14.5 % (ref 11.5–15.5)
WBC Count: 5.2 10*3/uL (ref 4.0–10.5)
nRBC: 0 % (ref 0.0–0.2)

## 2022-12-25 ENCOUNTER — Encounter: Payer: Self-pay | Admitting: Oncology

## 2022-12-26 ENCOUNTER — Inpatient Hospital Stay: Payer: Medicare Other

## 2022-12-26 NOTE — Progress Notes (Signed)
Patient has apt 01/18/23.

## 2022-12-27 DIAGNOSIS — D631 Anemia in chronic kidney disease: Secondary | ICD-10-CM | POA: Diagnosis not present

## 2022-12-27 DIAGNOSIS — N183 Chronic kidney disease, stage 3 unspecified: Secondary | ICD-10-CM | POA: Diagnosis not present

## 2022-12-27 DIAGNOSIS — I130 Hypertensive heart and chronic kidney disease with heart failure and stage 1 through stage 4 chronic kidney disease, or unspecified chronic kidney disease: Secondary | ICD-10-CM | POA: Diagnosis not present

## 2022-12-27 DIAGNOSIS — I5031 Acute diastolic (congestive) heart failure: Secondary | ICD-10-CM | POA: Diagnosis not present

## 2022-12-27 DIAGNOSIS — N179 Acute kidney failure, unspecified: Secondary | ICD-10-CM | POA: Diagnosis not present

## 2022-12-27 DIAGNOSIS — J90 Pleural effusion, not elsewhere classified: Secondary | ICD-10-CM | POA: Diagnosis not present

## 2023-01-02 DIAGNOSIS — I130 Hypertensive heart and chronic kidney disease with heart failure and stage 1 through stage 4 chronic kidney disease, or unspecified chronic kidney disease: Secondary | ICD-10-CM | POA: Diagnosis not present

## 2023-01-02 DIAGNOSIS — N179 Acute kidney failure, unspecified: Secondary | ICD-10-CM | POA: Diagnosis not present

## 2023-01-02 DIAGNOSIS — J90 Pleural effusion, not elsewhere classified: Secondary | ICD-10-CM | POA: Diagnosis not present

## 2023-01-02 DIAGNOSIS — D631 Anemia in chronic kidney disease: Secondary | ICD-10-CM | POA: Diagnosis not present

## 2023-01-02 DIAGNOSIS — I5031 Acute diastolic (congestive) heart failure: Secondary | ICD-10-CM | POA: Diagnosis not present

## 2023-01-02 DIAGNOSIS — N183 Chronic kidney disease, stage 3 unspecified: Secondary | ICD-10-CM | POA: Diagnosis not present

## 2023-01-09 DIAGNOSIS — J90 Pleural effusion, not elsewhere classified: Secondary | ICD-10-CM | POA: Diagnosis not present

## 2023-01-09 DIAGNOSIS — N179 Acute kidney failure, unspecified: Secondary | ICD-10-CM | POA: Diagnosis not present

## 2023-01-09 DIAGNOSIS — I130 Hypertensive heart and chronic kidney disease with heart failure and stage 1 through stage 4 chronic kidney disease, or unspecified chronic kidney disease: Secondary | ICD-10-CM | POA: Diagnosis not present

## 2023-01-09 DIAGNOSIS — D631 Anemia in chronic kidney disease: Secondary | ICD-10-CM | POA: Diagnosis not present

## 2023-01-09 DIAGNOSIS — I5031 Acute diastolic (congestive) heart failure: Secondary | ICD-10-CM | POA: Diagnosis not present

## 2023-01-09 DIAGNOSIS — N183 Chronic kidney disease, stage 3 unspecified: Secondary | ICD-10-CM | POA: Diagnosis not present

## 2023-01-18 ENCOUNTER — Ambulatory Visit: Payer: Medicare Other | Attending: Cardiology | Admitting: Cardiology

## 2023-01-18 ENCOUNTER — Encounter: Payer: Self-pay | Admitting: Cardiology

## 2023-01-18 VITALS — BP 150/46 | HR 62 | Ht 60.0 in | Wt 101.0 lb

## 2023-01-18 DIAGNOSIS — I10 Essential (primary) hypertension: Secondary | ICD-10-CM | POA: Insufficient documentation

## 2023-01-18 DIAGNOSIS — I442 Atrioventricular block, complete: Secondary | ICD-10-CM | POA: Diagnosis not present

## 2023-01-18 DIAGNOSIS — I5032 Chronic diastolic (congestive) heart failure: Secondary | ICD-10-CM | POA: Diagnosis not present

## 2023-01-18 NOTE — Progress Notes (Signed)
Electrophysiology Office Note   Date:  01/18/2023   ID:  Kaitlin Villarreal, DOB 10/03/1925, MRN [id number]  PCP:  Noni Saupe, MD  Cardiologist:  Dulce Sellar Primary Electrophysiologist:  Icis Budreau Jorja Loa, MD    No chief complaint on file.    History of Present Illness: Kaitlin Villarreal is a 89 y.o. female who is being seen today for the evaluation of complete heart block at the request of Norman Herrlich. Presenting today for electrophysiology evaluation.    She has a history significant for severe to stenosis status post TAVR, VSD, complete heart block post [employer] Jude dual-chamber pacemaker implanted 03/20/2018, chronic diastolic heart failure, hypertension.  Today, denies symptoms of palpitations, chest pain, shortness of breath, orthopnea, PND, lower extremity edema, claudication, dizziness, presyncope, syncope, bleeding, or neurologic sequela. The patient is tolerating medications without difficulties.  Since being seen she has done well.  She has had no chest pain or shortness of breath.  She has been able to do all of her daily activities and is without complaint.   Past Medical History:  Diagnosis Date   Acute on chronic diastolic heart failure 03/18/2018   Aortic stenosis 10/14/2014   Formatting of this note might be different from the original.  moderate by echo 11/2015   Arthritis    Bilateral renal masses 07/26/2017   Bone spur of toe of left foot    Chronic diastolic (congestive) heart failure    Chronic diastolic heart failure 03/18/2018   CKD (chronic kidney disease) stage 3, GFR 30-59 ml/min 07/26/2017   Essential hypertension    GERD (gastroesophageal reflux disease)    HLD (hyperlipidemia)    Hyperlipidemia 11/25/2016   Pacemaker 09/02/2018   Pancreatic lesion 04/17/2018   Pancreatic mass    a. 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 03/2018: s/p TAVR by Excell Seltzer and Dr. Cornelius Moras   Stage 3 chronic kidney disease    TIA (transient ischemic attack)     a. 1992   VSD (ventricular septal defect)    Past Surgical History:  Procedure Laterality Date   ABDOMINAL HYSTERECTOMY     APPENDECTOMY     HERNIA REPAIR     INTRAOPERATIVE TRANSTHORACIC ECHOCARDIOGRAM N/A 03/18/2018   Procedure: INTRAOPERATIVE TRANSTHORACIC ECHOCARDIOGRAM;  Surgeon: Tonny Bollman, MD;  Location: [facility] OR;  Service: Open Heart Surgery;  Laterality: N/A;   PACEMAKER IMPLANT N/A 03/20/2018   Procedure: PACEMAKER IMPLANT;  Surgeon: Regan Lemming, MD;  Location: [facility] INVASIVE CV LAB;  Service: Cardiovascular;  Laterality: N/A;   RIGHT HEART CATH N/A 03/20/2018   Procedure: RIGHT HEART CATH;  Surgeon: Tonny Bollman, MD;  Location: [facility] INVASIVE CV LAB;  Service: Cardiovascular;  Laterality: N/A;   RIGHT/LEFT HEART CATH AND CORONARY ANGIOGRAPHY N/A 02/06/2018   Procedure: RIGHT/LEFT HEART CATH AND CORONARY ANGIOGRAPHY;  Surgeon: Tonny Bollman, MD;  Location: [facility] INVASIVE CV LAB;  Service: Cardiovascular;  Laterality: N/A;   TONSILLECTOMY     TRANSCATHETER AORTIC VALVE REPLACEMENT, TRANSFEMORAL N/A 03/18/2018   Procedure: TRANSCATHETER AORTIC VALVE REPLACEMENT, TRANSFEMORAL;  Surgeon: Tonny Bollman, MD;  Location: [facility] OR;  Service: Open Heart Surgery;  Laterality: N/A;   WISDOM TOOTH EXTRACTION       Current Outpatient  Medications  Medication Sig Dispense Refill   allopurinol (ZYLOPRIM) 100 MG tablet Take 100 mg by mouth daily.     amLODipine (NORVASC) 5 MG tablet Take 5 mg by mouth daily.     aspirin EC 81 MG tablet Take 81 mg by mouth daily.      Cyanocobalamin (B-12) 2500 MCG TABS Take 2,500 mcg by mouth daily.      estradiol (ESTRACE) 1 MG tablet Take 0.5 mg by mouth daily.     fluticasone (FLONASE) 50 MCG/ACT nasal spray Place 2 sprays into both  nostrils daily as needed for allergies or rhinitis.     furosemide (LASIX) 20 MG tablet Take 1 tablet (20 mg total) by mouth every Monday, Wednesday, and Friday. Take a Furosemide on the off day if weight is 100 lbs. or greater 45 tablet 3   hydrALAZINE (APRESOLINE) 25 MG tablet Take 1 tablet (25 mg total) by mouth 3 (three) times daily as needed (when BP is over 180). 270 tablet 3   lisinopril (ZESTRIL) 40 MG tablet Take 40 mg by mouth daily.     metoprolol tartrate (LOPRESSOR) 50 MG tablet Take 1 tablet by mouth twice daily 180 tablet 0   Multiple Vitamins-Minerals (MULTI-VITAMIN GUMMIES) CHEW Chew 2 tablets by mouth daily.      Omega-3 Fatty Acids (FISH OIL) 500 MG CAPS Take 3 capsules by mouth daily.     sodium chloride (OCEAN) 0.65 % SOLN nasal spray Place 2 sprays into both nostrils as needed for congestion.     Vitamin D, Ergocalciferol, (DRISDOL) 1.25 MG (50000 UT) CAPS capsule Take 50,000 Units by mouth See admin instructions. Take 50,000 units by mouth 2 times a week- Mondays and Fridays  1   acetaminophen (TYLENOL) 650 MG CR tablet Take 650-1,300 mg by mouth every 8 (eight) hours as needed for pain.     amoxicillin (AMOXIL) 500 MG tablet Take 4 tablets (2,000 mg total) by mouth as directed. 1 hour prior to dental work including cleanings 12 tablet 12   Carboxymethylcellulose Sod PF (THERATEARS PF) 0.25 % SOLN Place 1 drop into both eyes in the morning and at bedtime.     meclizine (ANTIVERT) 25 MG tablet Take 25 mg by mouth every 8 (eight) hours as needed for dizziness. (Patient not taking: Reported on 12/10/2022)     No current facility-administered medications for this visit.    Allergies:   Clarithromycin, Latex, Levofloxacin, Azithromycin, Diphenhydramine hcl, and Ivp dye [iodinated contrast media]   Social History:  The patient  reports that she has never smoked. She has never been exposed to tobacco smoke. She has never used smokeless tobacco. She reports that she does not drink  alcohol and does not use drugs.   Family History:  The patient's family history includes Congenital heart disease in her brother; Heart attack in her brother; Heart disease in her mother; Uterine cancer in her sister.   ROS:  Please see the history of present illness.   Otherwise, review of systems is positive for none.   All other systems are reviewed and negative.   PHYSICAL EXAM: VS:  BP (!) 150/46   Pulse 62   Ht 5' (1.524 m)   Wt 101 lb (45.8 kg)   BMI 19.73 kg/m  , BMI Body mass index is 19.73 kg/m. GEN: Well nourished, well developed, in no acute distress  HEENT: normal  Neck: no JVD, carotid bruits, or masses Cardiac: RRR; no murmurs, rubs, or gallops,no edema  Respiratory:  clear to auscultation bilaterally, normal work of breathing GI: soft, nontender, nondistended, + BS MS: no deformity or atrophy  Skin: warm and dry, device site well healed Neuro:  Strength and sensation are intact Psych: euthymic mood, full affect  EKG:  EKG is not ordered today. Personal review of the ekg ordered 11/13/22 shows sinus rhythm  Personal review of the device interrogation today. Results in [facility]   Recent Labs: 11/08/2022: ALT 89 12/10/2022: BUN 31; Creatinine, Ser 1.37; NT-Pro BNP 4,407; Potassium 4.9; Sodium 141 12/24/2022: Hemoglobin 11.5; Platelet Count 168    Lipid Panel     Component Value Date/Time   CHOL 217 (H) 04/10/2021 1357   TRIG 180 (H) 04/10/2021 1357   HDL 78 04/10/2021 1357   CHOLHDL 2.8 04/10/2021 1357   LDLCALC 108 (H) 04/10/2021 1357     Wt Readings from Last 3 Encounters:  01/18/23 101 lb (45.8 kg)  12/10/22 101 lb 6.4 oz (46 kg)  11/28/22 104 lb 1.9 oz (47.2 kg)      Other studies Reviewed: Additional studies/ records that were reviewed today include: TTE 05/05/18  Review of the above records today demonstrates:  - Left ventricle: The cavity size was normal. Wall thickness was   increased in a pattern of moderate LVH. Systolic function was   normal.  The estimated ejection fraction was in the range of 60%   to 65%. Wall motion was normal; there were no regional wall   motion abnormalities. Features are consistent with a pseudonormal   left ventricular filling pattern, with concomitant abnormal   relaxation and increased filling pressure (grade 2 diastolic   dysfunction). - Aortic valve: A bioprosthesis was present. There was trivial   regurgitation. Valve area (VTI): 2.93 cm^2. Valve area (Vmax):   2.14 cm^2. Valve area (Vmean): 2.5 cm^2. - Mitral valve: There was mild regurgitation. - Left atrium: The atrium was moderately dilated. - Pulmonary arteries: Systolic pressure was moderately to severely   increased. PA peak pressure: 66 mm Hg (S).   ASSESSMENT AND PLAN:  1.  Complete heart block: Status post [facility] Jude dual-chamber pacemaker.  Device functioning appropriately.  Sensing, threshold, impedance within normal limits.  2.  Severe aortic stenosis: Status post TAVR.  Plan per primary cardiology.  3.  Chronic diastolic heart failure: No obvious volume overload  4.  Hypertension: Elevated today.  Followed by her primary physician.   Current medicines are reviewed at length with the patient today.   The patient does not have concerns regarding her medicines.  The following changes were made today: None  Labs/ tests ordered today include:  No orders of the defined types were placed in this encounter.    Disposition:   FU 12 months  Signed, Katherine Tout Jorja Loa, MD  01/18/2023 2:27 PM     [facility] [address] [place] [address] [phone] (office) [phone] (fax)

## 2023-01-18 NOTE — Patient Instructions (Signed)
Medication Instructions:  Your physician recommends that you continue on your current medications as directed. Please refer to the Current Medication list given to you today.  *If you need a refill on your cardiac medications before your next appointment, please call your pharmacy*   Lab Work: None ordered   Testing/Procedures: None ordered   Follow-Up: At [facility], you and your health needs are our priority.  As part of our continuing mission to provide you with exceptional heart care, we have created designated Provider Care Teams.  These Care Teams include your primary Cardiologist (physician) and Advanced Practice Providers (APPs -  Physician Assistants and Nurse Practitioners) who all work together to provide you with the care you need, when you need it.  We recommend signing up for the patient portal called "MyChart".  Sign up information is provided on this After Visit Summary.  MyChart is used to connect with patients for Virtual Visits (Telemedicine).  Patients are able to view lab/test results, encounter notes, upcoming appointments, etc.  Non-urgent messages can be sent to your provider as well.   To learn more about what you can do with MyChart, go to [email or web address].    Remote monitoring is used to monitor your Pacemaker or ICD from home. This monitoring reduces the number of office visits required to check your device to one time per year. It allows [place] to keep an eye on the functioning of your device to ensure it is working properly. You are scheduled for a device check from home on 03/20/23. You may send your transmission at any time that day. If you have a wireless device, the transmission will be sent automatically. After your physician reviews your transmission, you will receive a postcard with your next transmission date.  Your next appointment:   1 year(s)  The format for your next appointment:   In Person  Provider:   Loman Brooklyn, MD{    Thank you  for choosing [facility]!!   Dory Horn, RN [phone]

## 2023-01-21 ENCOUNTER — Inpatient Hospital Stay: Payer: Medicare Other | Attending: Oncology

## 2023-01-21 DIAGNOSIS — D631 Anemia in chronic kidney disease: Secondary | ICD-10-CM | POA: Insufficient documentation

## 2023-01-21 DIAGNOSIS — N183 Chronic kidney disease, stage 3 unspecified: Secondary | ICD-10-CM | POA: Diagnosis not present

## 2023-01-21 LAB — CBC WITH DIFFERENTIAL (CANCER CENTER ONLY)
Abs Immature Granulocytes: 0.02 10*3/uL (ref 0.00–0.07)
Basophils Absolute: 0 10*3/uL (ref 0.0–0.1)
Basophils Relative: 0 %
Eosinophils Absolute: 0 10*3/uL (ref 0.0–0.5)
Eosinophils Relative: 1 %
HCT: 31.1 % — ABNORMAL LOW (ref 36.0–46.0)
Hemoglobin: 9.7 g/dL — ABNORMAL LOW (ref 12.0–15.0)
Immature Granulocytes: 0 %
Lymphocytes Relative: 26 %
Lymphs Abs: 1.4 10*3/uL (ref 0.7–4.0)
MCH: 31.2 pg (ref 26.0–34.0)
MCHC: 31.2 g/dL (ref 30.0–36.0)
MCV: 100 fL (ref 80.0–100.0)
Monocytes Absolute: 0.5 10*3/uL (ref 0.1–1.0)
Monocytes Relative: 8 %
Neutro Abs: 3.5 10*3/uL (ref 1.7–7.7)
Neutrophils Relative %: 65 %
Platelet Count: 213 10*3/uL (ref 150–400)
RBC: 3.11 MIL/uL — ABNORMAL LOW (ref 3.87–5.11)
RDW: 14.6 % (ref 11.5–15.5)
WBC Count: 5.5 10*3/uL (ref 4.0–10.5)
nRBC: 0 % (ref 0.0–0.2)

## 2023-01-23 ENCOUNTER — Inpatient Hospital Stay: Payer: Medicare Other

## 2023-01-23 VITALS — BP 145/53 | HR 60 | Temp 98.0°F | Resp 18 | Ht 60.0 in | Wt 97.8 lb

## 2023-01-23 DIAGNOSIS — N183 Chronic kidney disease, stage 3 unspecified: Secondary | ICD-10-CM | POA: Diagnosis not present

## 2023-01-23 DIAGNOSIS — D631 Anemia in chronic kidney disease: Secondary | ICD-10-CM | POA: Diagnosis not present

## 2023-01-23 DIAGNOSIS — N189 Chronic kidney disease, unspecified: Secondary | ICD-10-CM

## 2023-01-23 MED ORDER — EPOETIN ALFA-EPBX 20000 UNIT/ML IJ SOLN
20000.0000 [IU] | Freq: Once | INTRAMUSCULAR | Status: AC
  Administered 2023-01-23: 20000 [IU] via SUBCUTANEOUS
  Filled 2023-01-23: qty 1

## 2023-01-23 NOTE — Patient Instructions (Signed)

## 2023-01-28 NOTE — Progress Notes (Signed)
Remote pacemaker transmission.   

## 2023-01-29 ENCOUNTER — Other Ambulatory Visit: Payer: Self-pay | Admitting: Cardiology

## 2023-02-18 ENCOUNTER — Inpatient Hospital Stay: Payer: Medicare Other | Attending: Oncology

## 2023-02-18 DIAGNOSIS — N183 Chronic kidney disease, stage 3 unspecified: Secondary | ICD-10-CM | POA: Insufficient documentation

## 2023-02-18 DIAGNOSIS — D631 Anemia in chronic kidney disease: Secondary | ICD-10-CM | POA: Diagnosis not present

## 2023-02-18 LAB — CBC WITH DIFFERENTIAL (CANCER CENTER ONLY)
Abs Immature Granulocytes: 0.01 10*3/uL (ref 0.00–0.07)
Basophils Absolute: 0 10*3/uL (ref 0.0–0.1)
Basophils Relative: 0 %
Eosinophils Absolute: 0.1 10*3/uL (ref 0.0–0.5)
Eosinophils Relative: 1 %
HCT: 34.3 % — ABNORMAL LOW (ref 36.0–46.0)
Hemoglobin: 10.7 g/dL — ABNORMAL LOW (ref 12.0–15.0)
Immature Granulocytes: 0 %
Lymphocytes Relative: 21 %
Lymphs Abs: 1.1 10*3/uL (ref 0.7–4.0)
MCH: 30.9 pg (ref 26.0–34.0)
MCHC: 31.2 g/dL (ref 30.0–36.0)
MCV: 99.1 fL (ref 80.0–100.0)
Monocytes Absolute: 0.3 10*3/uL (ref 0.1–1.0)
Monocytes Relative: 6 %
Neutro Abs: 3.7 10*3/uL (ref 1.7–7.7)
Neutrophils Relative %: 72 %
Platelet Count: 193 10*3/uL (ref 150–400)
RBC: 3.46 MIL/uL — ABNORMAL LOW (ref 3.87–5.11)
RDW: 14.6 % (ref 11.5–15.5)
WBC Count: 5.1 10*3/uL (ref 4.0–10.5)
nRBC: 0 % (ref 0.0–0.2)

## 2023-02-20 ENCOUNTER — Inpatient Hospital Stay (INDEPENDENT_AMBULATORY_CARE_PROVIDER_SITE_OTHER): Payer: Medicare Other | Admitting: Hematology and Oncology

## 2023-02-20 ENCOUNTER — Encounter: Payer: Self-pay | Admitting: Hematology and Oncology

## 2023-02-20 ENCOUNTER — Inpatient Hospital Stay: Payer: Medicare Other

## 2023-02-20 VITALS — BP 110/56 | HR 60 | Temp 97.9°F | Resp 18 | Ht 60.0 in | Wt 98.6 lb

## 2023-02-20 DIAGNOSIS — N189 Chronic kidney disease, unspecified: Secondary | ICD-10-CM

## 2023-02-20 DIAGNOSIS — D631 Anemia in chronic kidney disease: Secondary | ICD-10-CM

## 2023-02-20 NOTE — Progress Notes (Cosign Needed Addendum)
[facility]  [address] [place],  [place]  [address] [phone]  Clinic Day:  02/20/2023  Referring physician: Noni Saupe, MD   HISTORY OF PRESENT ILLNESS:  The patient is a 89 y.o. female with anemia secondary to chronic kidney disease.  We began seeing her February and no other specific etiology of her anemia was found.  She was placed on Retacrit 20,000 units monthly.  Her hemoglobin came up dramatically to 11.5 in March, so Retacrit was held.  Her hemoglobin then dropped to 9.7 in April, so she received Retacrit again at that time.  She is here today for repeat clinical assessment.  She remains fatigued, but denies progressive fatigue concerning for recurrent anemia.  She denies any overt form of blood loss.  She reports easy bruising.  She reports new lesions of her face and neck.  She denies recent illness.  She is not taking any new medications.  She does take aspirin 81 mg daily.  Her daughter accompanies her today and states that she has a lesion of the left forearm that is to be removed.  PHYSICAL EXAM:  Blood pressure (!) 110/56, pulse 60, temperature 97.9 F (36.6 C), temperature source Oral, resp. rate 18, height 5' (1.524 m), weight 98 lb 9.6 oz (44.7 kg), SpO2 99 %. Wt Readings from Last 3 Encounters:  02/20/23 98 lb 9.6 oz (44.7 kg)  01/23/23 97 lb 12 oz (44.3 kg)  01/18/23 101 lb (45.8 kg)   Body mass index is 19.26 kg/m.  Performance status (ECOG): 1 - Symptomatic but completely ambulatory  Physical Exam Vitals and nursing note reviewed.  Constitutional:      General: She is not in acute distress.    Appearance: Normal appearance.  HENT:     Head: Normocephalic and atraumatic.     Mouth/Throat:     Mouth: Mucous membranes are moist.     Pharynx: Oropharynx is clear. No oropharyngeal exudate or posterior oropharyngeal erythema.  Eyes:     General: No scleral icterus.    Extraocular Movements: Extraocular movements  intact.     Conjunctiva/sclera: Conjunctivae normal.     Pupils: Pupils are equal, round, and reactive to light.  Cardiovascular:     Rate and Rhythm: Normal rate and regular rhythm.     Heart sounds: Murmur heard.     Systolic murmur is present with a grade of 3/6.     No friction rub. No gallop.  Pulmonary:     Effort: Pulmonary effort is normal.     Breath sounds: Normal breath sounds. No wheezing, rhonchi or rales.  Abdominal:     General: There is no distension.     Palpations: Abdomen is soft. There is no hepatomegaly, splenomegaly or mass.     Tenderness: There is no abdominal tenderness.  Musculoskeletal:        General: Normal range of motion.     Cervical back: Normal range of motion and neck supple. No tenderness.     Right lower leg: No edema.     Left lower leg: No edema.  Lymphadenopathy:     Cervical: No cervical adenopathy.     Upper Body:     Right upper body: No supraclavicular or axillary adenopathy.     Left upper body: No supraclavicular or axillary adenopathy.  Skin:    General: Skin is warm and dry.     Coloration: Skin is not jaundiced.     Findings: Bruising (  Scattered bruising of the bilateral upper and lower extremities.) and lesion (Heaped up, firm lesion left forearm) present. No rash.     Comments: Multiple purpura of the bilateral face along the jawbone, as well as a few purpura of the anterior neck  Neurological:     Mental Status: She is alert and oriented to person, place, and time.     Cranial Nerves: No cranial nerve deficit.  Psychiatric:        Mood and Affect: Mood normal.        Behavior: Behavior normal.        Thought Content: Thought content normal.   LABS:      Latest Ref Rng & Units 02/18/2023    1:30 PM 01/21/2023    1:44 PM 12/24/2022    1:28 PM  CBC  WBC 4.0 - 10.5 K/uL 5.1  5.5  5.2   Hemoglobin 12.0 - 15.0 g/dL 45.4  9.7  09.8   Hematocrit 36.0 - 46.0 % 34.3  31.1  36.7   Platelets 150 - 400 K/uL 193  213  168        Latest Ref Rng & Units 12/10/2022   11:46 AM 11/08/2022   12:00 AM 07/20/2022    2:16 PM  CMP  Glucose 70 - 99 mg/dL 84   98   BUN 10 - 36 mg/dL 31  46     42   Creatinine 0.57 - 1.00 mg/dL 1.19  1.5     1.47   Sodium 134 - 144 mmol/L 141  143     140   Potassium 3.5 - 5.2 mmol/L 4.9  4.3     4.9   Chloride 96 - 106 mmol/L 103  110     103   CO2 20 - 29 mmol/L 22  23     23    Calcium 8.7 - 10.3 mg/dL 9.8  9.2     9.4   Alkaline Phos 25 - 125  55       AST 13 - 35  98       ALT 7 - 35 U/L  89          This result is from an external source.    Lab Results  Component Value Date   TOTALPROTELP 6.9 11/08/2022   ALBUMINELP 3.7 11/08/2022   A1GS 0.3 11/08/2022   A2GS 0.6 11/08/2022   BETS 0.9 11/08/2022   GAMS 1.4 11/08/2022   MSPIKE Not Observed 11/08/2022   SPEI Comment 11/08/2022   Lab Results  Component Value Date   TIBC 358 11/08/2022   FERRITIN 194 11/08/2022   IRONPCTSAT 17 11/08/2022   No results found for: "LDH"     Component Value Date/Time   TOTALPROTELP 6.9 11/08/2022 1452   ALBUMINELP 3.7 11/08/2022 1452   A1GS 0.3 11/08/2022 1452   A2GS 0.6 11/08/2022 1452   BETS 0.9 11/08/2022 1452   GAMS 1.4 11/08/2022 1452   MSPIKE Not Observed 11/08/2022 1452   SPEI Comment 11/08/2022 1452    Review Flowsheet       Latest Ref Rng & Units 11/08/2022  Oncology Labs  Ferritin 11 - 307 ng/mL 194   %SAT 10.4 - 31.8 % 17   Total Protein ELP 6.0 - 8.5 g/dL 6.9   Albumin ELP 2.9 - 4.4 g/dL 3.7   Alpha-1 Globulin 0.0 - 0.4 g/dL 0.3   Alpha-2 Globulin 0.4 - 1.0 g/dL 0.6   Beta Globulin 0.7 - 1.3 g/dL 0.9  Gamma Globulin 0.4 - 1.8 g/dL 1.4   M-Spike, % Not Observed g/dL Not Observed   SPE Interp. - Comment      STUDIES:  No results found.    ASSESSMENT & PLAN:   Assessment/Plan:  89 y.o. female with anemia secondary to chronic kidney disease for which she receives Retacrit 20,000 units monthly as needed.  As her hemoglobin is 10.7, we will hold Retacrit again  this month.  She will continue to have CBC monthly and Retacrit as indicated.  She has appropriate left face and neck, most likely benign as her platelets are normal and she has not had a recent illness.  She sees dermatology and they could assess this as well.  We will plan to see her back in 3 months for repeat clinical assessment.  The patient understands all the plans discussed today and is in agreement with them.  She knows to contact our office if she develops concerns prior to her next appointment.     Adah Perl, PA-C   Physician Assistant [facility] [phone]

## 2023-02-21 ENCOUNTER — Telehealth: Payer: Self-pay | Admitting: Hematology and Oncology

## 2023-02-21 DIAGNOSIS — M216X2 Other acquired deformities of left foot: Secondary | ICD-10-CM | POA: Diagnosis not present

## 2023-02-21 NOTE — Telephone Encounter (Signed)
Contacted pts daughter to notify her of scheduled appts, unable to reach via phone /DF    Scheduling Message Entered by Belva Crome A on 02/20/2023 at 11:54 AM Priority: Routine <No visit type provided>  Department: [facility]  Provider:  Scheduling Notes:  Labs and possible injection 2 days later every 4 weeks with f/u with Dr. Melvyn Neth in 12 weeks with labs and possible injection

## 2023-03-12 ENCOUNTER — Encounter: Payer: Self-pay | Admitting: Cardiology

## 2023-03-12 ENCOUNTER — Ambulatory Visit: Payer: Medicare Other | Attending: Cardiology | Admitting: Cardiology

## 2023-03-12 VITALS — BP 130/70 | HR 70 | Ht 60.0 in | Wt 99.8 lb

## 2023-03-12 DIAGNOSIS — N183 Chronic kidney disease, stage 3 unspecified: Secondary | ICD-10-CM

## 2023-03-12 DIAGNOSIS — I442 Atrioventricular block, complete: Secondary | ICD-10-CM

## 2023-03-12 DIAGNOSIS — I5032 Chronic diastolic (congestive) heart failure: Secondary | ICD-10-CM | POA: Diagnosis present

## 2023-03-12 DIAGNOSIS — Z950 Presence of cardiac pacemaker: Secondary | ICD-10-CM

## 2023-03-12 DIAGNOSIS — L570 Actinic keratosis: Secondary | ICD-10-CM | POA: Diagnosis not present

## 2023-03-12 DIAGNOSIS — Z952 Presence of prosthetic heart valve: Secondary | ICD-10-CM

## 2023-03-12 DIAGNOSIS — D485 Neoplasm of uncertain behavior of skin: Secondary | ICD-10-CM | POA: Diagnosis not present

## 2023-03-12 DIAGNOSIS — I10 Essential (primary) hypertension: Secondary | ICD-10-CM

## 2023-03-12 NOTE — Patient Instructions (Signed)
Medication Instructions:  Your physician recommends that you continue on your current medications as directed. Please refer to the Current Medication list given to you today.  *If you need a refill on your cardiac medications before your next appointment, please call your pharmacy*   Lab Work: Your physician recommends that you return for lab work in:   Labs today: BMP  If you have labs (blood work) drawn today and your tests are completely normal, you will receive your results only by: MyChart Message (if you have MyChart) OR A paper copy in the mail If you have any lab test that is abnormal or we need to change your treatment, we will call you to review the results.   Testing/Procedures: None   Follow-Up: At [facility], you and your health needs are our priority.  As part of our continuing mission to provide you with exceptional heart care, we have created designated Provider Care Teams.  These Care Teams include your primary Cardiologist (physician) and Advanced Practice Providers (APPs -  Physician Assistants and Nurse Practitioners) who all work together to provide you with the care you need, when you need it.  We recommend signing up for the patient portal called "MyChart".  Sign up information is provided on this After Visit Summary.  MyChart is used to connect with patients for Virtual Visits (Telemedicine).  Patients are able to view lab/test results, encounter notes, upcoming appointments, etc.  Non-urgent messages can be sent to your provider as well.   To learn more about what you can do with MyChart, go to [email or web address].    Your next appointment:   3 month(s)  Provider:   Brian Munley, MD    Other Instructions None  

## 2023-03-12 NOTE — Progress Notes (Signed)
Cardiology Office Note:    Date:  03/12/2023   ID:  Kaitlin Villarreal, DOB 07-30-1926, MRN [id number]  PCP:  Noni Saupe, MD   [facility] Providers Cardiologist:  Norman Herrlich, MD Electrophysiologist:  Will Jorja Loa, MD     Referring MD: Noni Saupe, MD   CC: follow up   History of Present Illness:    Kaitlin Villarreal is a 89 y.o. female with a hx of hypertension, complete heart block s/p Saint Jude dual-chamber PPM on 03/20/2018 chronic diastolic heart failure, ventral septal defect, severe aortic stenosis s/p TAVR 2019, history of TIAs, GERD, CKD stage III, anemia related to CKD, hyperlipidemia.  Echo at [facility] on 11/15/2022 revealed an EF greater than 70%, mild concentric LVH, RV mildly enlarged, mild mitral valve annular calcification, mild to moderate MR, mild to moderate MS, mild AR.  Most recently evaluated by general cardiology on 12/10/2022 with Dr. Dulce Sellar, was noted not to have had good results s/p TAVR ER with persistent heart failure that was difficult to manage.  She was instructed to start taking Lasix 20 mg on Monday Wednesday Friday and on any day if her weight was greater than 100 pounds.  Her lisinopril was increased to 40 mg daily.  Most recently evaluated by Dr. Elberta Fortis on 01/18/2023, at this time she was doing well from a cardiac perspective and she was advised she could follow-up with EP in 12 months.  Remote pacemaker device check on 12/26/2022 had revealed abnormal device interrogation, noise noted on A lead.  She presents today accompanied by her daughter for follow-up.  She states she has been doing much better since she has been started on Lasix, her instructions are to take Monday Wednesday Friday and or if she is greater than 100 pounds.  She states she weighs herself daily her weights are typically between 94 to 95 pounds.  Her daughter states she occasionally takes Lasix if she has pedal edema.  She notes that her breathlessness is  much improved, she offers no formal complaints today. She denies chest pain, palpitations, dyspnea, pnd, orthopnea, n, v, dizziness, syncope, weight gain, or early satiety.    Past Medical History:  Diagnosis Date   Acute on chronic diastolic heart failure (HCC) 03/18/2018   Aortic stenosis 10/14/2014   Formatting of this note might be different from the original.  moderate by echo 11/2015   Arthritis    Bilateral renal masses 07/26/2017   Bone spur of toe of left foot    Chronic diastolic (congestive) heart failure (HCC)    Chronic diastolic heart failure (HCC) 03/18/2018   CKD (chronic kidney disease) stage 3, GFR 30-59 ml/min (HCC) 07/26/2017   Essential hypertension    GERD (gastroesophageal reflux disease)    HLD (hyperlipidemia)    Hyperlipidemia 11/25/2016   Pacemaker 09/02/2018   Pancreatic lesion 04/17/2018   Pancreatic mass    a. 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 03/2018: s/p TAVR by Excell Seltzer and Dr. Cornelius Moras   Stage 3 chronic kidney disease (HCC)    TIA (transient ischemic attack)     a. 1992   VSD (ventricular septal defect)     Past Surgical History:  Procedure Laterality Date   ABDOMINAL HYSTERECTOMY     APPENDECTOMY     HERNIA REPAIR     INTRAOPERATIVE TRANSTHORACIC ECHOCARDIOGRAM N/A 03/18/2018   Procedure: INTRAOPERATIVE TRANSTHORACIC ECHOCARDIOGRAM;  Surgeon: Tonny Bollman, MD;  Location: [facility] OR;  Service: Open Heart Surgery;  Laterality: N/A;   PACEMAKER IMPLANT N/A 03/20/2018   Procedure: PACEMAKER IMPLANT;  Surgeon: Regan Lemming, MD;  Location: [facility] INVASIVE CV LAB;  Service: Cardiovascular;  Laterality: N/A;   RIGHT HEART CATH N/A 03/20/2018   Procedure: RIGHT HEART CATH;  Surgeon: Tonny Bollman, MD;   Location: [facility] INVASIVE CV LAB;  Service: Cardiovascular;  Laterality: N/A;   RIGHT/LEFT HEART CATH AND CORONARY ANGIOGRAPHY N/A 02/06/2018   Procedure: RIGHT/LEFT HEART CATH AND CORONARY ANGIOGRAPHY;  Surgeon: Tonny Bollman, MD;  Location: [facility] INVASIVE CV LAB;  Service: Cardiovascular;  Laterality: N/A;   TONSILLECTOMY     TRANSCATHETER AORTIC VALVE REPLACEMENT, TRANSFEMORAL N/A 03/18/2018   Procedure: TRANSCATHETER AORTIC VALVE REPLACEMENT, TRANSFEMORAL;  Surgeon: Tonny Bollman, MD;  Location: [name] OR;  Service: Open Heart Surgery;  Laterality: N/A;   WISDOM TOOTH EXTRACTION      Current Medications: Current Meds  Medication Sig   acetaminophen (TYLENOL) 650 MG CR tablet Take 650-1,300 mg by mouth every 8 (eight) hours as needed for pain.   allopurinol (ZYLOPRIM) 100 MG tablet Take 100 mg by mouth daily.   amLODipine (NORVASC) 5 MG tablet Take 5 mg by mouth daily.   amoxicillin (AMOXIL) 500 MG tablet Take 4 tablets (2,000 mg total) by mouth as directed. 1 hour prior to dental work including cleanings   aspirin EC 81 MG tablet Take 81 mg by mouth daily.    Carboxymethylcellulose Sod PF (THERATEARS PF) 0.25 % SOLN Place 1 drop into both eyes in the morning and at bedtime.   Cyanocobalamin (B-12) 2500 MCG TABS Take 2,500 mcg by mouth daily.    estradiol (ESTRACE) 1 MG tablet Take 0.5 mg by mouth daily.   fluticasone (FLONASE) 50 MCG/ACT nasal spray Place 2 sprays into both nostrils daily as needed for allergies or rhinitis.   furosemide (LASIX) 20 MG tablet Take 1 tablet (20 mg total) by mouth every Monday, Wednesday, and Friday. Take a Furosemide on the off day if weight is 100 lbs. or greater   hydrALAZINE (APRESOLINE) 25 MG tablet Take 1 tablet (25 mg total) by mouth 3 (three) times daily as needed (when BP is over 180).   lisinopril (ZESTRIL) 40 MG tablet Take 40 mg by mouth daily.   metoprolol tartrate (LOPRESSOR) 50 MG tablet Take 1 tablet by mouth twice daily   Multiple Vitamins-Minerals  (MULTI-VITAMIN GUMMIES) CHEW Chew 2 tablets by mouth daily.    Omega-3 Fatty Acids (FISH OIL) 500 MG CAPS Take 3 capsules by mouth daily.   sodium chloride (OCEAN) 0.65 % SOLN nasal spray Place 2 sprays into both nostrils as needed for congestion.   Vitamin D, Ergocalciferol, (DRISDOL) 1.25 MG (50000 UT) CAPS capsule Take 50,000 Units by mouth See admin instructions. Take 50,000 units by mouth 2 times a week- Mondays and Fridays     Allergies:   Clarithromycin, Latex, Levofloxacin, Azithromycin, Diphenhydramine hcl, and Ivp dye [iodinated contrast media]   Social History   Socioeconomic History   Marital status: Married    Spouse name: Not on file   Number of children: Not on file   Years of education: Not on file   Highest education level: Not on file  Occupational History   Not on  file  Tobacco Use   Smoking status: Never    Passive exposure: Never   Smokeless tobacco: Never  Vaping Use   Vaping Use: Never used  Substance and Sexual Activity   Alcohol use: No   Drug use: No   Sexual activity: Not on file  Other Topics Concern   Not on file  Social History Narrative   Not on file   Social Determinants of Health   Financial Resource Strain: Not on file  Food Insecurity: No Food Insecurity (11/08/2022)   Hunger Vital Sign    Worried About Running Out of Food in the Last Year: Never true    Ran Out of Food in the Last Year: Never true  Transportation Needs: No Transportation Needs (11/08/2022)   PRAPARE - Administrator, Civil Service (Medical): No    Lack of Transportation (Non-Medical): No  Physical Activity: Not on file  Stress: Not on file  Social Connections: Not on file     Family History: The patient's family history includes Congenital heart disease in her brother; Heart attack in her brother; Heart disease in her mother; Uterine cancer in her sister.  ROS:   Please see the history of present illness.     All other systems reviewed and are  negative.  EKGs/Labs/Other Studies Reviewed:    The following studies were reviewed today: Cardiac Studies & Procedures   CARDIAC CATHETERIZATION  CARDIAC CATHETERIZATION 03/20/2018  Narrative 1.  No significant left to right shunting based on absence of a significant O2 step up from the SVC to the pulmonary artery 2.  Elevated right and left heart intracardiac filling pressures consistent with acute on chronic diastolic heart failure  Plan: Serial echo follow-up of the patient's small perimembranous VSD, IV diuresis and medical therapy for treatment of congestive heart failure, permanent pacemaker placement per Dr. Elberta Fortis to follow   CARDIAC CATHETERIZATION  CARDIAC CATHETERIZATION 02/06/2018  Narrative  Ost 1st Diag lesion is 50% stenosed.  Prox Cx lesion is 40% stenosed.  There is severe aortic valve stenosis.  1.  Calcified coronary arteries without significant stenosis.  Mild proximal left circumflex stenosis, minimal irregularity of the RCA, minimal irregularity of the LAD and moderate stenosis of an ostial diagonal 2.  Severely calcified aortic valve with known severe aortic stenosis by noninvasive assessment 3.  Normal right heart hemodynamics with preserved cardiac output and normal intracardiac filling pressures  Recommendations: Continue multidisciplinary evaluation for TAVR.  Medical therapy for mild nonobstructive CAD.  Findings Coronary Findings Diagnostic  Dominance: Right  Left Main There is mild diffuse disease throughout the vessel. The vessel is moderately calcified. The left main is calcified with no significant stenosis  Left Anterior Descending The vessel exhibits minimal luminal irregularities. The LAD is patent throughout.  The distal vessel has multiple angulated segments but there are no stenoses.  The origin of the first diagonal branch has 50% stenosis.  First Diagonal Branch Ost 1st Diag lesion is 50% stenosed.  Left Circumflex There is mild  diffuse disease throughout the vessel. Prox Cx lesion is 40% stenosed. The lesion is moderately calcified.  First Obtuse Marginal Branch Vessel is moderate in size. There is mild disease in the vessel.  Second Obtuse Marginal Branch There is mild disease in the vessel.  Right Coronary Artery Vessel is moderate in size. There is mild diffuse disease throughout the vessel. The origin of the RCA is calcified around the right cusp.  The vessel has moderate diffuse calcification.  The vessel is widely patent with minor diffuse irregularities noted but no significant stenosis.  Intervention  No interventions have been documented.   STRESS TESTS  ECHOCARDIOGRAM STRESS TEST 01/08/2018  Narrative *[facility]* [address] [place], [place] [address] [phone]  ------------------------------------------------------------------- Stress Echocardiography  Patient:    Devena, Arceneaux MR #:       [id number] Study Date: 01/08/2018 Gender:     F Age:        42 Height:     152.4 cm Weight:     54.5 kg BSA:        1.53 m^2 Pt. Status: Room:  ATTENDING    Default, Provider [phone] [facility]     Norman Herrlich, MD REFERRING    Norman Herrlich, MD REFERRING    Noni Saupe PERFORMING   [facility] SONOGRAPHER  Sinda Du, RDCS  cc:  -------------------------------------------------------------------  ------------------------------------------------------------------- Indications:      Aortic stenosis 424.1.  ------------------------------------------------------------------- History:   PMH:   Murmur.  Aortic valve disease.  Risk factors: Hypertension.  ------------------------------------------------------------------- Study Conclusions  - Aortic valve: Valve area (VTI): 0.7 cm^2. Valve area (Vmax): 0.76 cm^2. Valve area (Vmean): 0.76 cm^2. - Stress: There was a normal resting blood pressure with a hypotensive response to stress. Functional capacity  was decreased (greater than 40%). - Stress ECG conclusions: There were no stress arrhythmias or conduction abnormalities. The stress ECG was non-diagnostic.  ------------------------------------------------------------------- Labs, prior tests, procedures, and surgery: ECG.     Abnormal.  ------------------------------------------------------------------- Study data:   Study status:  Routine.  Consent:  The risks, benefits, and alternatives to the procedure were explained to the patient and informed consent was obtained.  Procedure:  Initial setup. The patient was brought to the laboratory. A baseline ECG was recorded. Surface ECG leads and automatic cuff blood pressure measurements were monitored. Treadmill exercise testing was performed using the modified Bruce protocol. The patient exercised for 4.5 min, to a maximal work rate of 3.4 mets. Exercise was terminated due to fatigue. Transthoracic stress echocardiography for assessment of valvular function. Image quality was adequate. Images were captured at baseline and peak exercise.  Study completion:  The patient tolerated the procedure well. There were no complications.          Modified Bruce protocol. Stress echocardiography.  Birthdate:  Patient birthdate: 02-22-1926.  Age: Patient is 89 yr old.  Sex:  Gender: female.    BMI: 23.5 kg/m^2. Blood pressure:     181/72  Patient status:  Outpatient.  Study date:  Study date: 01/08/2018. Study time: 11:35 AM.  -------------------------------------------------------------------  ------------------------------------------------------------------- Aortic valve:  Resting gradient .  Post stress: Peak stress velocity 376 cm/s. mean velocity 297cm/s. Mean gradient 39 mmHG.  Doppler:     VTI ratio of LVOT to aortic valve: 0.22. Valve area (VTI): 0.7 cm^2. Indexed valve area (VTI): 0.46 cm^2/m^2. Peak velocity ratio of LVOT to aortic valve: 0.24. Valve area (Vmax): 0.76 cm^2.  Indexed valve area (Vmax): 0.5 cm^2/m^2. Mean velocity ratio of LVOT to aortic valve: 0.24. Valve area (Vmean): 0.76 cm^2. Indexed valve area (Vmean): 0.5 cm^2/m^2. Resting mean gradient (S): 44 mm Hg. Peak gradient (S): 75 mm Hg.  ------------------------------------------------------------------- Mitral valve:   Doppler:     Peak gradient (D): 9 mm Hg.  ------------------------------------------------------------------- Baseline ECG:  Normal.  Normal sinus rhythm.  Nonspecific ST changes.  ------------------------------------------------------------------- Stress protocol:  +---------------------+---+------------+--------+ !Stage                !  HR !BP (mmHg)   !Symptoms! +---------------------+---+------------+--------+ !Baseline             !70 !181/72 (108)!None    ! +---------------------+---+------------+--------+ !Stage 0              !105!------------!--------! +---------------------+---+------------+--------+ !Stage 1/2            !116!------------!--------! +---------------------+---+------------+--------+ !Immediate post stress!113!------------!--------! +---------------------+---+------------+--------+ !Recovery; 1 min      !88 !------------!--------! +---------------------+---+------------+--------+ !Recovery; 2 min      !82 !------------!--------! +---------------------+---+------------+--------+  ------------------------------------------------------------------- Stress results:   Maximal heart rate during stress was 116 bpm (89% of maximal predicted heart rate). The maximal predicted heart rate was 131 bpm.The target heart rate was achieved. The heart rate response to stress was normal. There was a normal resting blood pressure with a hypotensive response to stress. The rate-pressure product for the peak heart rate and blood pressure was 84132 mm Hg/min.  The patient experienced no chest pain during stress. Functional capacity was decreased (greater than  40%).  ------------------------------------------------------------------- Stress ECG:  There were no stress arrhythmias or conduction abnormalities.  The stress ECG was non-diagnostic.  ------------------------------------------------------------------- Measurements  Left ventricle                            Value          Reference LV ID, ED, PLAX chordal           (L)     33.4  mm       43 - 52 LV ID, ES, PLAX chordal           (L)     17.9  mm       23 - 38 LV fx shortening, PLAX chordal            46    %        >=29 LV PW thickness, ED                       10.2  mm       --------- IVS/LV PW ratio, ED               (H)     1.49           <=1.3 Stroke volume, 2D                         84    ml       --------- Stroke volume/bsa, 2D                     55    ml/m^2   ---------  Ventricular septum                        Value          Reference IVS thickness, ED                         15.2  mm       ---------  LVOT                                      Value          Reference LVOT ID, S  20    mm       --------- LVOT area                                 3.14  cm^2     --------- LVOT peak velocity, S                     105   cm/s     --------- LVOT mean velocity, S                     75.8  cm/s     --------- LVOT VTI, S                               26.8  cm       ---------  Aortic valve                              Value          Reference Aortic valve peak velocity, S             434   cm/s     --------- Aortic valve mean velocity, S             312   cm/s     --------- Aortic valve VTI, S                       121   cm       --------- Aortic mean gradient, S                   40    mm Hg    --------- Aortic peak gradient, S                   75    mm Hg    --------- VTI ratio, LVOT/AV                        0.22           --------- Aortic valve area, VTI                    0.7   cm^2     --------- Aortic valve area/bsa, VTI                0.46   cm^2/m^2 --------- Velocity ratio, peak, LVOT/AV             0.24           --------- Aortic valve area, peak velocity          0.76  cm^2     --------- Aortic valve area/bsa, peak               0.5   cm^2/m^2 --------- velocity Velocity ratio, mean, LVOT/AV             0.24           --------- Aortic valve area, mean velocity          0.76  cm^2     --------- Aortic valve area/bsa, mean               0.5   cm^2/m^2 --------- velocity  Aorta                                     Value          Reference Aortic root ID, ED                        27    mm       ---------  Left atrium                               Value          Reference LA ID, A-P, ES                            36    mm       --------- LA ID/bsa, A-P                    (H)     2.36  cm/m^2   <=2.2  Mitral valve                              Value          Reference Mitral E-wave peak velocity               147   cm/s     --------- Mitral A-wave peak velocity               118   cm/s     --------- Mitral deceleration time                  194   ms       150 - 230 Mitral peak gradient, D                   9     mm Hg    --------- Mitral E/A ratio, peak                    1.2            ---------  Legend: (L)  and  (H)  mark values outside specified reference range.  ------------------------------------------------------------------- Prepared and Electronically Authenticated by  Norman Herrlich, MD 2019-04-10T13:52:39   ECHOCARDIOGRAM  ECHOCARDIOGRAM COMPLETE 08/13/2019  Narrative ECHOCARDIOGRAM REPORT    Patient Name:   NEYDELIN ORTLOFF Ragain   Date of Exam: 08/12/2019 Medical Rec #:  [id number]     Height:       60.0 in Accession #:    [id number]    Weight:       116.0 lb Date of Birth:  04/14/1926     BSA:          1.48 m Patient Age:    89 years      BP:           130/50 mmHg Patient Gender: F             HR:           73 bpm. Exam Location:  [address]  Procedure: 2D Echo, Cardiac Doppler and Color  Doppler  Indications:     Z95.2 Post TAVR evaluation  History:  Patient has prior history of Echocardiogram examinations, most recent 02/04/2019. TAVR. Hypertensive heart disease. 3rd degree AV block. VSD. Chronic kidney disease.  Sonographer:     Cathie Beams RCS Referring Phys:  [id number] Wille Celeste THOMPSON Diagnosing Phys: Kristeen Miss MD  IMPRESSIONS   1. Left ventricular ejection fraction, by visual estimation, is 60 to 65%. The left ventricle has normal function. There is mildly increased left ventricular hypertrophy. 2. There is a small VSD with left to right shunting present. 3. Global right ventricle has normal systolic function.The right ventricular size is normal. No increase in right ventricular wall thickness. 4. Left atrial size was normal. 5. Right atrial size was normal. 6. The mitral valve is normal in structure. Mild mitral valve regurgitation. Mild mitral stenosis. 7. The tricuspid valve is grossly normal. Tricuspid valve regurgitation mild-moderate. 8. Aortic valve regurgitation is not visualized. 9. The pulmonic valve was grossly normal. Pulmonic valve regurgitation is not visualized. 10. Severely elevated pulmonary artery systolic pressure. 11. The atrial septum is grossly normal.  FINDINGS Left Ventricle: Left ventricular ejection fraction, by visual estimation, is 60 to 65%. The left ventricle has normal function. There is mildly increased left ventricular hypertrophy. There is a small VSD with left to right shunting present.  Right Ventricle: The right ventricular size is normal. No increase in right ventricular wall thickness. Global RV systolic function is has normal systolic function. The tricuspid regurgitant velocity is 4.69 m/s, and with an assumed right atrial pressure of 10 mmHg, the estimated right ventricular systolic pressure is severely elevated at 98.0 mmHg.  Left Atrium: Left atrial size was normal in size.  Right Atrium: Right atrial  size was normal in size  Pericardium: There is no evidence of pericardial effusion.  Mitral Valve: The mitral valve is normal in structure. Mild mitral valve stenosis by observation. MV peak gradient, 14.0 mmHg. Mild mitral valve regurgitation.  Tricuspid Valve: The tricuspid valve is grossly normal. Tricuspid valve regurgitation mild-moderate.  Aortic Valve: The aortic valve has been repaired/replaced. Aortic valve regurgitation is not visualized. Aortic valve mean gradient measures 6.5 mmHg. Aortic valve peak gradient measures 13.4 mmHg. Aortic valve area, by VTI measures 2.36 cm. Homograft aortic valve valve is present in the aortic position.  Pulmonic Valve: The pulmonic valve was grossly normal. Pulmonic valve regurgitation is not visualized.  Aorta: The aortic root and ascending aorta are structurally normal, with no evidence of dilitation.  IAS/Shunts: The atrial septum is grossly normal.    LEFT VENTRICLE PLAX 2D LVIDd:         3.50 cm  Diastology LVIDs:         1.90 cm  LV e' lateral:   8.49 cm/s LV PW:         1.20 cm  LV E/e' lateral: 18.7 LV IVS:        1.30 cm  LV e' medial:    5.87 cm/s LVOT diam:     2.00 cm  LV E/e' medial:  27.1 LV SV:         40 ml LV SV Index:   26.48 LVOT Area:     3.14 cm   RIGHT VENTRICLE RV Basal diam:  2.20 cm RV S prime:     12.40 cm/s TAPSE (M-mode): 1.9 cm RVSP:           91.0 mmHg  LEFT ATRIUM             Index       RIGHT ATRIUM  Index LA diam:        3.30 cm 2.23 cm/m  RA Pressure: 3.00 mmHg LA Vol (A2C):   50.4 ml 34.03 ml/m RA Area:     11.60 cm LA Vol (A4C):   28.9 ml 19.52 ml/m RA Volume:   23.80 ml  16.07 ml/m LA Biplane Vol: 38.7 ml 26.13 ml/m AORTIC VALVE AV Area (Vmax):    1.92 cm AV Area (Vmean):   1.97 cm AV Area (VTI):     2.36 cm AV Vmax:           183.00 cm/s AV Vmean:          119.500 cm/s AV VTI:            0.466 m AV Peak Grad:      13.4 mmHg AV Mean Grad:      6.5 mmHg LVOT Vmax:          112.00 cm/s LVOT Vmean:        74.900 cm/s LVOT VTI:          0.350 m LVOT/AV VTI ratio: 0.75  MITRAL VALVE                         TRICUSPID VALVE MV Area (PHT): 2.87 cm              TR Peak grad:   88.0 mmHg MV Peak grad:  14.0 mmHg             TR Vmax:        469.00 cm/s MV Mean grad:  6.0 mmHg              Estimated RAP:  3.00 mmHg MV Vmax:       1.87 m/s              RVSP:           91.0 mmHg MV Vmean:      115.0 cm/s MV VTI:        0.54 m                SHUNTS MV PHT:        76.56 msec            Systemic VTI:  0.35 m MV Decel Time: 264 msec              Systemic Diam: 2.00 cm MV E velocity: 159.00 cm/s 103 cm/s MV A velocity: 142.00 cm/s 70.3 cm/s MV E/A ratio:  1.12        1.5   Kristeen Miss MD Electronically signed by Kristeen Miss MD Signature Date/Time: 08/12/2019/5:28:06 PM    Final (Updated)   TEE  ECHO TEE 03/18/2018  Narrative *[place]* *[facility]* [address] [place], [place] [id number] [phone]  ------------------------------------------------------------------- Transesophageal Echocardiography  Patient:    Aubryn, Llerenas MR #:       [id number] Study Date: 03/18/2018 Gender:     F Age:        25 Height:     152.4 cm Weight:     54.4 kg BSA:        1.53 m^2 Pt. Status: Room:       2H24C  ADMITTING    Tressie Stalker, M.D. ATTENDING    Tonny Bollman, MD ORDERING     Tonny Bollman, MD REFERRING    Tonny Bollman, MD PERFORMING   Tobias Alexander, M.D. SONOGRAPHER  [place] Fermo  [place]  cc:  ------------------------------------------------------------------- LV EF: 65% -   70%  ------------------------------------------------------------------- Indications:      Aortic stenosis 424.1.  ------------------------------------------------------------------- History:   Risk factors:  Hypertension. Dyslipidemia.  ------------------------------------------------------------------- Study Conclusions  - Left ventricle: Wall  thickness was increased in a pattern of moderate LVH. Systolic function was vigorous. The estimated ejection fraction was in the range of 65% to 70%. Wall motion was normal; there were no regional wall motion abnormalities. - Aortic valve: There was severe regurgitation. Valve area (VTI): 0.77 cm^2. Valve area (Vmax): 0.8 cm^2. Valve area (Vmean): 2.37 cm^2. - Mitral valve: There was mild regurgitation. - Left atrium: No evidence of thrombus in the atrial cavity or appendage. No evidence of thrombus in the atrial cavity or appendage. - Right atrium: No evidence of thrombus in the atrial cavity or appendage.  Impressions:  - This was a periprocedural echocardiogram during a TAVR procedure. TTE was switched to TEE for concern of a VSD and a follow up TEE was done 5 hours later.  Native aortic valve was severely thickened and calcified with severely restricted leaflet openings. Peak/mean transaortic gradients were 68/41 mmHg consistent with severe aortic stenosis. A 23 mm Edwards-SAPIEN 3 valve was successfully deployed in the aortic position. Post-deployment gradients were 12/4 mmHg. No paravalvular leak. A small perimembranous VSD was noted with small left to right shunting. A TEE probe was inserted and confirmed the diagnosis. There is trivial pericardial effusion. Mild mitral and tricuspid regurgitation.  5 hours later a limited TTE was performed. It showed unchanged small VSD, stable aortic valve with normal unchanged gradients. No paravalvular leak. Trivial pericardial effusion.  ------------------------------------------------------------------- Study data:   Study status:  Routine.  Consent:  The risks, benefits, and alternatives to the procedure were explained to the patient and informed consent was obtained.  Procedure:  Study started as a limited transthoracic, then TEE probe was inserted to evaluate for VSD. The patient reported no pain pre or post test. Initial  setup. The patient was brought to the laboratory. Surface ECG leads were monitored. Sedation. Conscious sedation was administered by anesthesiology staff. Transesophageal echocardiography. Topical anesthesia was obtained using viscous lidocaine. An adult multiplane transesophageal probe was inserted by the anesthesiologistwithout difficulty. Image quality was adequate.  Study completion:  The patient tolerated the procedure well. There were no complications.          Diagnostic transesophageal echocardiography.  2D and color Doppler. Birthdate:  Patient birthdate: 03/15/1926.  Age:  Patient is 89 yr old.  Sex:  Gender: female.    BMI: 23.4 kg/m^2.  Blood pressure: 204/61  Patient status:  Inpatient.  Study date:  Study date: 03/18/2018. Study time: 07:34 AM.  Location:  Operating room.  -------------------------------------------------------------------  ------------------------------------------------------------------- Left ventricle:   Wall thickness was increased in a pattern of moderate LVH.   Systolic function was vigorous. The estimated ejection fraction was in the range of 65% to 70%. Wall motion was normal; there were no regional wall motion abnormalities.  ------------------------------------------------------------------- Aortic valve:   Trileaflet; severely thickened, severely calcified leaflets. Cusp separation was normal.  Doppler:  There was severe regurgitation.    VTI ratio of LVOT to aortic valve: 1.2. Valve area (VTI): 0.77 cm^2. Indexed valve area (VTI): 0.5 cm^2/m^2. Peak velocity ratio of LVOT to aortic valve: 0.92. Valve area (Vmax): 0.8 cm^2. Indexed valve area (Vmax): 0.52 cm^2/m^2. Mean velocity ratio of LVOT to aortic valve: 0.93. Valve area (Vmean): 2.37 cm^2. Indexed valve area (Vmean): 1.55 cm^2/m^2.  Mean gradient (S): 3 mm Hg. Peak gradient (S): 6 mm Hg.  ------------------------------------------------------------------- Aorta:  There was no  atheroma. There was no evidence for dissection. Aortic root: The aortic root was not dilated. Ascending aorta: The ascending aorta was normal in size. Aortic arch: The aortic arch was normal in size. Descending aorta: The descending aorta was normal in size.  ------------------------------------------------------------------- Mitral valve:   Mildly thickened leaflets . Leaflet separation was normal.  Doppler:  There was mild regurgitation.  ------------------------------------------------------------------- Left atrium:  The atrium was normal in size.  No evidence of thrombus in the atrial cavity or appendage.  No evidence of thrombus in the atrial cavity or appendage. The appendage was morphologically a left appendage, multilobulated, and of normal size. Emptying velocity was normal.  ------------------------------------------------------------------- Right ventricle:  The cavity size was normal. Wall thickness was normal. Systolic function was normal.  ------------------------------------------------------------------- Pulmonic valve:    Structurally normal valve.  ------------------------------------------------------------------- Tricuspid valve:   Structurally normal valve.   Leaflet separation was normal.  Doppler:  There was mild regurgitation.  ------------------------------------------------------------------- Pulmonary artery:   The main pulmonary artery was normal-sized.  ------------------------------------------------------------------- Right atrium:  The atrium was normal in size.  No evidence of thrombus in the atrial cavity or appendage. The appendage was morphologically a right appendage.  ------------------------------------------------------------------- Pericardium:  There was no pericardial effusion.  ------------------------------------------------------------------- Measurements  Left ventricle                            Value          Reference LV  ID, ED, PLAX chordal           (L)     28.7  mm       43 - 52 LV ID, ES, PLAX chordal           (L)     19.9  mm       23 - 38 LV fx shortening, PLAX chordal            31    %        >=29 LV PW thickness, ED                       14.3  mm       --------- IVS/LV PW ratio, ED                       0.93           <=1.3 Stroke volume, 2D                         76    ml       --------- Stroke volume/bsa, 2D                     50    ml/m^2   --------- LV end-diastolic volume, 1-p A4C          44    ml       --------- LV ejection fraction, 1-p A4C             66    %        --------- LV end-diastolic volume/bsa, 1-p          29    ml/m^2   --------- A4C LV end-diastolic volume, 2-p  48    ml       --------- LV end-systolic volume, 2-p               16    ml       --------- LV ejection fraction, 2-p                 68    %        --------- Stroke volume, 2-p                        32    ml       --------- LV end-diastolic volume/bsa, 2-p          31    ml/m^2   --------- LV end-systolic volume/bsa, 2-p           10    ml/m^2   --------- Stroke volume/bsa, 2-p                    21.1  ml/m^2   ---------  Ventricular septum                        Value          Reference IVS thickness, ED                         13.3  mm       ---------  LVOT                                      Value          Reference LVOT ID, S                                18    mm       --------- LVOT area                                 2.54  cm^2     --------- LVOT peak velocity, S                     109   cm/s     --------- LVOT mean velocity, S                     77.3  cm/s     --------- LVOT VTI, S                               30.1  cm       ---------  Aortic valve                              Value          Reference Aortic valve peak velocity, S             118   cm/s     --------- Aortic valve mean velocity, S             82.7  cm/s     --------- Aortic valve  VTI, S                       25    cm        --------- Aortic mean gradient, S                   3     mm Hg    --------- Aortic peak gradient, S                   6     mm Hg    --------- VTI ratio, LVOT/AV                        1.2            --------- Aortic valve area, VTI                    0.77  cm^2     --------- Aortic valve area/bsa, VTI                0.5   cm^2/m^2 --------- Velocity ratio, peak, LVOT/AV             0.92           --------- Aortic valve area, peak velocity          0.8   cm^2     --------- Aortic valve area/bsa, peak               0.52  cm^2/m^2 --------- velocity Velocity ratio, mean, LVOT/AV             0.93           --------- Aortic valve area, mean velocity          2.37  cm^2     --------- Aortic valve area/bsa, mean               1.55  cm^2/m^2 --------- velocity  Aorta                                     Value          Reference Aortic root ID, ED                        27    mm       --------- Ascending aorta ID, A-P, S                29    mm       ---------  Left atrium                               Value          Reference LA ID, A-P, ES                            24    mm       --------- LA ID/bsa, A-P                            1.57  cm/m^2   <=2.2  Legend: (L)  and  (H)  mark values outside specified reference  range.  ------------------------------------------------------------------- Prepared and Electronically Authenticated by  Tobias Alexander, M.D. 2019-06-18T16:24:45    CT SCANS  CT CORONARY MORPH W/CTA COR W/SCORE 02/20/2018  Addendum 02/20/2018  6:32 PM ADDENDUM REPORT: 02/20/2018 18:29  CLINICAL DATA:  89 year old female with severe aortic stenosis being evaluated for a TAVR procedure.  EXAM: Cardiac TAVR CT  TECHNIQUE: The patient was scanned on a [employer]. A 120 kV retrospective scan was triggered in the descending thoracic aorta at 111 HU's. Gantry rotation speed was 250 msecs and collimation was .6 mm. No beta blockade or nitro were given.  The 3D data set was reconstructed in 5% intervals of the R-R cycle. Systolic and diastolic phases were analyzed on a dedicated work station using MPR, MIP and VRT modes. The patient received 80 cc of contrast.  FINDINGS: Aortic Valve: Trileaflet, severely thickened, moderately calcified aortic valve with mild calcifications extending asymmetrically into the LVOT under the right and left coronary cusps.  Aorta: Normal size with moderate diffuse calcifications and atherosclerosis, no dissection.  Sinotubular Junction: 24 x 24 mm  Ascending Thoracic Aorta: 29 x 29 mm  Aortic Arch: 23 x 23 mm  Descending Thoracic Aorta: 19 x 19 mm  Sinus of Valsalva Measurements:  Non-coronary: 31 mm  Right -coronary: 30 mm  Left -coronary: 29 mm  Sinus of Valsalva Height:  Right -coronary: 22 mm  Left -coronary: 21 mm  Coronary Artery Height above Annulus:  Left Main: 17 mm  Right Coronary: 18 mm  Virtual Basal Annulus Measurements:  Maximum/Minimum Diameter: 22.7 x 18.5 mm  Mean Diameter: 20.4 mm  Perimeter: 65.1 mm  Area: 326 mm2  Optimum Fluoroscopic Angle for Delivery: LAO 24 CAU 20  IMPRESSION: 1. Trileaflet, severely thickened, moderately calcified aortic valve with mild calcifications extending asymmetrically into the LVOT under the right and left coronary cusps. Annular measurements suitable for delivery of a 20 mm Edwards-SAPIEN 3 valve or a 26 mm Medtronic Evolut R valve.  2. Sufficient coronary to annulus distance.  3. Optimum Fluoroscopic Angle for Delivery: LAO 24 CAU 20  4. No thrombus in the left atrial appendage.   Electronically Signed By: Tobias Alexander On: 02/20/2018 18:29  Narrative EXAM: OVER-READ INTERPRETATION  CT CHEST  The following report is an over-read performed by radiologist Dr. Trudie Reed of [facility] on 02/20/2018. This over-read does not include interpretation of cardiac or coronary anatomy or pathology.  The coronary calcium score/coronary CTA interpretation by the cardiologist is attached.  COMPARISON:  None.  FINDINGS: Extracardiac findings will be described under separate dictation for contemporaneously obtained CTA chest, abdomen and pelvis.  IMPRESSION: Please see separate dictation for contemporaneously obtained CTA chest, abdomen and pelvis dated 02/20/2018 for full description of relevant extracardiac findings.  Electronically Signed: By: Trudie Reed M.D. On: 02/20/2018 15:14          EKG:  EKG is not ordered today.   Recent Labs: 11/08/2022: ALT 89 12/10/2022: BUN 31; Creatinine, Ser 1.37; NT-Pro BNP 4,407; Potassium 4.9; Sodium 141 02/18/2023: Hemoglobin 10.7; Platelet Count 193  Recent Lipid Panel    Component Value Date/Time   CHOL 217 (H) 04/10/2021 1357   TRIG 180 (H) 04/10/2021 1357   HDL 78 04/10/2021 1357   CHOLHDL 2.8 04/10/2021 1357   LDLCALC 108 (H) 04/10/2021 1357     Risk Assessment/Calculations:                Physical Exam:    VS:  BP 130/70 Comment: typical  home BP reading  Pulse 70   Ht 5' (1.524 m)   Wt 99 lb 12.8 oz (45.3 kg)   SpO2 96%   BMI 19.49 kg/m     Wt Readings from Last 3 Encounters:  03/12/23 99 lb 12.8 oz (45.3 kg)  02/20/23 98 lb 9.6 oz (44.7 kg)  01/23/23 97 lb 12 oz (44.3 kg)     GEN: Appears younger than stated age, thin but well nourished, well developed in no acute distress HEENT: Normal NECK: No JVD; No carotid bruits LYMPHATICS: No lymphadenopathy CARDIAC: RRR, 3/6 systolic murmur, rubs, gallops RESPIRATORY:  Clear to auscultation without rales, wheezing or rhonchi  ABDOMEN: Soft, non-tender, non-distended MUSCULOSKELETAL: Trace pedal edema left greater than right; No deformity  SKIN: Warm and dry NEUROLOGIC:  Alert and oriented x 3 PSYCHIATRIC:  Normal affect   ASSESSMENT:    1. Chronic diastolic heart failure (HCC)   2. Complete AV block (HCC)   3. Pacemaker   4. S/P TAVR (transcatheter  aortic valve replacement)   5. Essential hypertension   6. Stage 3 chronic kidney disease, unspecified whether stage 3a or 3b CKD (HCC)    PLAN:    In order of problems listed above:  Chronic diastolic heart failure -most recent echo on 04/24/2022 revealed revealed an EF greater than 70%, mild concentric LVH; NYHA class I euvolemic.  Continue lisinopril 40 mg daily, continue metoprolol 50 mg twice daily, continue Lasix 20 mg Monday Wednesday Friday also as needed for weight gain greater than 100 and or pedal edema.  Will repeat BMET.  She is feeling well with her current medication regimen, do not think we should add further GDMT secondary to frailty. Complete AV block/s/p pacemaker - followed by EP, will see them in ~ 8 months.  S/P TAVR - 3/6 murmur noted, most recent echo revealed mild AR. No indication for repeat imaging.  HTN -blood pressure is mildly elevated in the office today however at home is typically well-controlled.  Will not make any changes to her blood pressure medications at this time.  Stage III CKD-will repeat BMET after initiation of diuretic.   Disposition-BMET, return in 3 months.       Medication Adjustments/Labs and Tests Ordered: Current medicines are reviewed at length with the patient today.  Concerns regarding medicines are outlined above.  No orders of the defined types were placed in this encounter.  No orders of the defined types were placed in this encounter.   There are no Patient Instructions on file for this visit.   Signed, Flossie Dibble, NP  03/12/2023 11:32 AM    [facility]

## 2023-03-13 LAB — BASIC METABOLIC PANEL WITH GFR
BUN/Creatinine Ratio: 29 — ABNORMAL HIGH (ref 12–28)
BUN: 43 mg/dL — ABNORMAL HIGH (ref 10–36)
CO2: 27 mmol/L (ref 20–29)
Calcium: 10.1 mg/dL (ref 8.7–10.3)
Chloride: 101 mmol/L (ref 96–106)
Creatinine, Ser: 1.49 mg/dL — ABNORMAL HIGH (ref 0.57–1.00)
Glucose: 77 mg/dL (ref 70–99)
Potassium: 4.8 mmol/L (ref 3.5–5.2)
Sodium: 141 mmol/L (ref 134–144)
eGFR: 32 mL/min/1.73 — ABNORMAL LOW

## 2023-03-18 ENCOUNTER — Inpatient Hospital Stay: Payer: Medicare Other | Attending: Oncology

## 2023-03-18 DIAGNOSIS — N183 Chronic kidney disease, stage 3 unspecified: Secondary | ICD-10-CM | POA: Diagnosis not present

## 2023-03-18 DIAGNOSIS — D631 Anemia in chronic kidney disease: Secondary | ICD-10-CM | POA: Diagnosis not present

## 2023-03-18 LAB — CBC WITH DIFFERENTIAL (CANCER CENTER ONLY)
Abs Immature Granulocytes: 0.02 10*3/uL (ref 0.00–0.07)
Basophils Absolute: 0 10*3/uL (ref 0.0–0.1)
Basophils Relative: 1 %
Eosinophils Absolute: 0.1 10*3/uL (ref 0.0–0.5)
Eosinophils Relative: 1 %
HCT: 31.5 % — ABNORMAL LOW (ref 36.0–46.0)
Hemoglobin: 9.9 g/dL — ABNORMAL LOW (ref 12.0–15.0)
Immature Granulocytes: 0 %
Lymphocytes Relative: 26 %
Lymphs Abs: 1.7 10*3/uL (ref 0.7–4.0)
MCH: 31 pg (ref 26.0–34.0)
MCHC: 31.4 g/dL (ref 30.0–36.0)
MCV: 98.7 fL (ref 80.0–100.0)
Monocytes Absolute: 0.4 10*3/uL (ref 0.1–1.0)
Monocytes Relative: 7 %
Neutro Abs: 4.2 10*3/uL (ref 1.7–7.7)
Neutrophils Relative %: 65 %
Platelet Count: 188 10*3/uL (ref 150–400)
RBC: 3.19 MIL/uL — ABNORMAL LOW (ref 3.87–5.11)
RDW: 14.8 % (ref 11.5–15.5)
WBC Count: 6.5 10*3/uL (ref 4.0–10.5)
nRBC: 0 % (ref 0.0–0.2)

## 2023-03-19 ENCOUNTER — Encounter: Payer: Self-pay | Admitting: Oncology

## 2023-03-20 ENCOUNTER — Inpatient Hospital Stay: Payer: Medicare Other

## 2023-03-20 ENCOUNTER — Ambulatory Visit (INDEPENDENT_AMBULATORY_CARE_PROVIDER_SITE_OTHER): Payer: Medicare Other

## 2023-03-20 VITALS — BP 140/30 | HR 65 | Temp 97.9°F | Resp 12 | Ht 60.0 in | Wt 100.1 lb

## 2023-03-20 DIAGNOSIS — I442 Atrioventricular block, complete: Secondary | ICD-10-CM

## 2023-03-20 DIAGNOSIS — D631 Anemia in chronic kidney disease: Secondary | ICD-10-CM

## 2023-03-20 DIAGNOSIS — N183 Chronic kidney disease, stage 3 unspecified: Secondary | ICD-10-CM | POA: Diagnosis not present

## 2023-03-20 MED ORDER — EPOETIN ALFA-EPBX 20000 UNIT/ML IJ SOLN
20000.0000 [IU] | Freq: Once | INTRAMUSCULAR | Status: AC
  Administered 2023-03-20: 20000 [IU] via SUBCUTANEOUS
  Filled 2023-03-20: qty 1

## 2023-03-21 LAB — CUP PACEART REMOTE DEVICE CHECK
Battery Remaining Longevity: 73 mo
Battery Remaining Percentage: 61 %
Battery Voltage: 2.99 V
Brady Statistic AP VP Percent: 1 %
Brady Statistic AP VS Percent: 47 %
Brady Statistic AS VP Percent: 1 %
Brady Statistic AS VS Percent: 53 %
Brady Statistic RA Percent Paced: 47 %
Brady Statistic RV Percent Paced: 1 %
Date Time Interrogation Session: 20240619020015
Implantable Lead Connection Status: 753985
Implantable Lead Connection Status: 753985
Implantable Lead Implant Date: 20190620
Implantable Lead Implant Date: 20190620
Implantable Lead Location: 753859
Implantable Lead Location: 753860
Implantable Pulse Generator Implant Date: 20190620
Lead Channel Impedance Value: 340 Ohm
Lead Channel Impedance Value: 430 Ohm
Lead Channel Pacing Threshold Amplitude: 0.75 V
Lead Channel Pacing Threshold Amplitude: 0.75 V
Lead Channel Pacing Threshold Pulse Width: 0.5 ms
Lead Channel Pacing Threshold Pulse Width: 0.5 ms
Lead Channel Sensing Intrinsic Amplitude: 4.3 mV
Lead Channel Sensing Intrinsic Amplitude: 7.5 mV
Lead Channel Setting Pacing Amplitude: 1 V
Lead Channel Setting Pacing Amplitude: 2 V
Lead Channel Setting Pacing Pulse Width: 0.5 ms
Lead Channel Setting Sensing Sensitivity: 2 mV
Pulse Gen Model: 2272
Pulse Gen Serial Number: 9035930

## 2023-03-28 DIAGNOSIS — C44629 Squamous cell carcinoma of skin of left upper limb, including shoulder: Secondary | ICD-10-CM | POA: Diagnosis not present

## 2023-04-15 ENCOUNTER — Inpatient Hospital Stay: Payer: Medicare Other | Attending: Oncology

## 2023-04-15 DIAGNOSIS — D649 Anemia, unspecified: Secondary | ICD-10-CM | POA: Insufficient documentation

## 2023-04-15 DIAGNOSIS — D631 Anemia in chronic kidney disease: Secondary | ICD-10-CM

## 2023-04-15 LAB — CBC WITH DIFFERENTIAL (CANCER CENTER ONLY)
Abs Immature Granulocytes: 0.02 10*3/uL (ref 0.00–0.07)
Basophils Absolute: 0 10*3/uL (ref 0.0–0.1)
Basophils Relative: 0 %
Eosinophils Absolute: 0 10*3/uL (ref 0.0–0.5)
Eosinophils Relative: 0 %
HCT: 32.3 % — ABNORMAL LOW (ref 36.0–46.0)
Hemoglobin: 10.1 g/dL — ABNORMAL LOW (ref 12.0–15.0)
Immature Granulocytes: 0 %
Lymphocytes Relative: 14 %
Lymphs Abs: 1 10*3/uL (ref 0.7–4.0)
MCH: 31.5 pg (ref 26.0–34.0)
MCHC: 31.3 g/dL (ref 30.0–36.0)
MCV: 100.6 fL — ABNORMAL HIGH (ref 80.0–100.0)
Monocytes Absolute: 0.4 10*3/uL (ref 0.1–1.0)
Monocytes Relative: 5 %
Neutro Abs: 5.8 10*3/uL (ref 1.7–7.7)
Neutrophils Relative %: 81 %
Platelet Count: 198 10*3/uL (ref 150–400)
RBC: 3.21 MIL/uL — ABNORMAL LOW (ref 3.87–5.11)
RDW: 15.2 % (ref 11.5–15.5)
WBC Count: 7.2 10*3/uL (ref 4.0–10.5)
nRBC: 0 % (ref 0.0–0.2)

## 2023-04-17 ENCOUNTER — Inpatient Hospital Stay: Payer: Medicare Other

## 2023-05-12 NOTE — Progress Notes (Unsigned)
[facility]  [address] [place],  [place]  [address] [phone]  Clinic Day:  05/13/2023  Referring physician: Noni Saupe, MD   HISTORY OF PRESENT ILLNESS:  The patient is a 89 y.o. female with anemia secondary to kidney disease.  She has been receiving monthly Retacrit injections to get her hemoglobin above 10.  She comes in today to reassess her anemia.  Since her last visit, the patient has been doing okay.  She denies having increased fatigue or any overt forms of blood loss which concern her for progressive anemia.    PHYSICAL EXAM: Blood pressure (!) 166/66, pulse 66, temperature 97.8 F (36.6 C), resp. rate 14, height 5' (1.524 m), weight 100 lb 6.4 oz (45.5 kg), SpO2 95%. Wt Readings from Last 3 Encounters:  05/13/23 100 lb 6.4 oz (45.5 kg)  03/20/23 100 lb 1.9 oz (45.4 kg)  03/12/23 99 lb 12.8 oz (45.3 kg)   Body mass index is 19.61 kg/m. Performance status (ECOG): 1 - Symptomatic but completely ambulatory Physical Exam Constitutional:      Appearance: Normal appearance. She is not ill-appearing.  HENT:     Mouth/Throat:     Mouth: Mucous membranes are moist.     Pharynx: Oropharynx is clear. No oropharyngeal exudate or posterior oropharyngeal erythema.  Cardiovascular:     Rate and Rhythm: Normal rate and regular rhythm.     Heart sounds: Murmur heard.     No friction rub. No gallop.  Pulmonary:     Effort: Pulmonary effort is normal. No respiratory distress.     Breath sounds: Normal breath sounds. No wheezing, rhonchi or rales.  Abdominal:     General: Bowel sounds are normal. There is no distension.     Palpations: Abdomen is soft. There is no mass.     Tenderness: There is no abdominal tenderness.  Musculoskeletal:        General: No swelling.     Right lower leg: No edema.     Left lower leg: No edema.  Lymphadenopathy:     Cervical: No cervical adenopathy.     Upper Body:     Right upper body: No  supraclavicular or axillary adenopathy.     Left upper body: No supraclavicular or axillary adenopathy.     Lower Body: No right inguinal adenopathy. No left inguinal adenopathy.  Skin:    General: Skin is warm.     Coloration: Skin is not jaundiced.     Findings: No lesion or rash.  Neurological:     General: No focal deficit present.     Mental Status: She is alert and oriented to person, place, and time. Mental status is at baseline.  Psychiatric:        Mood and Affect: Mood normal.        Behavior: Behavior normal.        Thought Content: Thought content normal.    LABS:       Latest Ref Rng & Units 03/12/2023   11:46 AM 12/10/2022   11:46 AM 11/08/2022   12:00 AM  CMP  Glucose 70 - 99 mg/dL 77  84    BUN 10 - 36 mg/dL 43  31  46      Creatinine 0.57 - 1.00 mg/dL 9.14  7.82  1.5      Sodium 134 - 144 mmol/L 141  141  143      Potassium 3.5 - 5.2 mmol/L 4.8  4.9  4.3      Chloride 96 - 106 mmol/L 101  103  110      CO2 20 - 29 mmol/L 27  22  23       Calcium 8.7 - 10.3 mg/dL 09.8  9.8  9.2      Alkaline Phos 25 - 125   55      AST 13 - 35   98      ALT 7 - 35 U/L   89         This result is from an external source.   ASSESSMENT & PLAN:  A 89 y.o. female with anemia secondary to chronic renal insufficiency.  I am pleased as her hemoglobin is over 10 today.  Based upon this, she will not need a Retacrit shot this month.  However, her CBC will continue to be checked monthly to ensure her hemoglobin in not falling.  Monthly Retacrit shots will be given for a hemoglobin less than 10.  Otherwise, as she is doing well, I will see her back in 4 months for repeat clinical assessment.  The patient understands all the plans discussed today and is in agreement with them.   Kirby Funk, MD

## 2023-05-13 ENCOUNTER — Other Ambulatory Visit: Payer: Self-pay | Admitting: Oncology

## 2023-05-13 ENCOUNTER — Inpatient Hospital Stay: Payer: Medicare Other | Attending: Oncology

## 2023-05-13 ENCOUNTER — Inpatient Hospital Stay (INDEPENDENT_AMBULATORY_CARE_PROVIDER_SITE_OTHER): Payer: Medicare Other | Admitting: Oncology

## 2023-05-13 VITALS — BP 166/66 | HR 66 | Temp 97.8°F | Resp 14 | Ht 60.0 in | Wt 100.4 lb

## 2023-05-13 DIAGNOSIS — N189 Chronic kidney disease, unspecified: Secondary | ICD-10-CM | POA: Insufficient documentation

## 2023-05-13 DIAGNOSIS — D649 Anemia, unspecified: Secondary | ICD-10-CM | POA: Diagnosis not present

## 2023-05-13 DIAGNOSIS — D631 Anemia in chronic kidney disease: Secondary | ICD-10-CM

## 2023-05-13 LAB — CBC AND DIFFERENTIAL
HCT: 31 — AB (ref 36–46)
Hemoglobin: 10.4 — AB (ref 12.0–16.0)
Neutrophils Absolute: 5.91
Platelets: 201 10*3/uL (ref 150–400)
WBC: 8.1

## 2023-05-13 LAB — FERRITIN: Ferritin: 180 ng/mL (ref 11–307)

## 2023-05-13 LAB — IRON AND TIBC
Iron: 109 ug/dL (ref 28–170)
Saturation Ratios: 31 % (ref 10.4–31.8)
TIBC: 350 ug/dL (ref 250–450)
UIBC: 241 ug/dL

## 2023-05-13 LAB — CBC: RBC: 3.26 — AB (ref 3.87–5.11)

## 2023-05-14 ENCOUNTER — Telehealth: Payer: Self-pay | Admitting: Oncology

## 2023-05-14 ENCOUNTER — Encounter: Payer: Self-pay | Admitting: Oncology

## 2023-05-14 NOTE — Telephone Encounter (Signed)
spoke with patients daughter, made aware she will not need injeciton on 05/15/2023 per direction of scheduling message from susan phy.

## 2023-05-15 ENCOUNTER — Inpatient Hospital Stay: Payer: Medicare Other

## 2023-05-20 DIAGNOSIS — D7389 Other diseases of spleen: Secondary | ICD-10-CM | POA: Diagnosis not present

## 2023-05-20 DIAGNOSIS — N281 Cyst of kidney, acquired: Secondary | ICD-10-CM | POA: Diagnosis not present

## 2023-05-20 DIAGNOSIS — I700 Atherosclerosis of aorta: Secondary | ICD-10-CM | POA: Diagnosis not present

## 2023-05-20 DIAGNOSIS — J90 Pleural effusion, not elsewhere classified: Secondary | ICD-10-CM | POA: Diagnosis not present

## 2023-05-20 DIAGNOSIS — R932 Abnormal findings on diagnostic imaging of liver and biliary tract: Secondary | ICD-10-CM | POA: Diagnosis not present

## 2023-05-23 DIAGNOSIS — M216X2 Other acquired deformities of left foot: Secondary | ICD-10-CM | POA: Diagnosis not present

## 2023-05-23 DIAGNOSIS — M1A0790 Idiopathic chronic gout, unspecified ankle and foot, without tophus (tophi): Secondary | ICD-10-CM | POA: Diagnosis not present

## 2023-06-10 ENCOUNTER — Inpatient Hospital Stay: Payer: Medicare Other | Attending: Oncology

## 2023-06-10 DIAGNOSIS — N1832 Chronic kidney disease, stage 3b: Secondary | ICD-10-CM | POA: Insufficient documentation

## 2023-06-10 DIAGNOSIS — M25531 Pain in right wrist: Secondary | ICD-10-CM | POA: Diagnosis not present

## 2023-06-10 DIAGNOSIS — D631 Anemia in chronic kidney disease: Secondary | ICD-10-CM | POA: Diagnosis not present

## 2023-06-10 DIAGNOSIS — I10 Essential (primary) hypertension: Secondary | ICD-10-CM | POA: Diagnosis not present

## 2023-06-10 DIAGNOSIS — M109 Gout, unspecified: Secondary | ICD-10-CM | POA: Diagnosis not present

## 2023-06-10 DIAGNOSIS — E782 Mixed hyperlipidemia: Secondary | ICD-10-CM | POA: Diagnosis not present

## 2023-06-10 DIAGNOSIS — M25431 Effusion, right wrist: Secondary | ICD-10-CM | POA: Diagnosis not present

## 2023-06-10 DIAGNOSIS — Z79899 Other long term (current) drug therapy: Secondary | ICD-10-CM | POA: Diagnosis not present

## 2023-06-10 LAB — CBC WITH DIFFERENTIAL (CANCER CENTER ONLY)
Abs Immature Granulocytes: 0.02 10*3/uL (ref 0.00–0.07)
Basophils Absolute: 0 10*3/uL (ref 0.0–0.1)
Basophils Relative: 1 %
Eosinophils Absolute: 0.1 10*3/uL (ref 0.0–0.5)
Eosinophils Relative: 2 %
HCT: 29.1 % — ABNORMAL LOW (ref 36.0–46.0)
Hemoglobin: 9 g/dL — ABNORMAL LOW (ref 12.0–15.0)
Immature Granulocytes: 0 %
Lymphocytes Relative: 21 %
Lymphs Abs: 1.2 10*3/uL (ref 0.7–4.0)
MCH: 31.5 pg (ref 26.0–34.0)
MCHC: 30.9 g/dL (ref 30.0–36.0)
MCV: 101.7 fL — ABNORMAL HIGH (ref 80.0–100.0)
Monocytes Absolute: 0.3 10*3/uL (ref 0.1–1.0)
Monocytes Relative: 5 %
Neutro Abs: 4.2 10*3/uL (ref 1.7–7.7)
Neutrophils Relative %: 71 %
Platelet Count: 219 10*3/uL (ref 150–400)
RBC: 2.86 MIL/uL — ABNORMAL LOW (ref 3.87–5.11)
RDW: 14.5 % (ref 11.5–15.5)
WBC Count: 5.8 10*3/uL (ref 4.0–10.5)
nRBC: 0 % (ref 0.0–0.2)

## 2023-06-10 LAB — LAB REPORT - SCANNED: EGFR: 31

## 2023-06-12 ENCOUNTER — Encounter: Payer: Self-pay | Admitting: Oncology

## 2023-06-12 ENCOUNTER — Inpatient Hospital Stay: Payer: Medicare Other

## 2023-06-12 VITALS — BP 155/81 | HR 63 | Temp 97.9°F | Wt 102.0 lb

## 2023-06-12 DIAGNOSIS — D631 Anemia in chronic kidney disease: Secondary | ICD-10-CM

## 2023-06-12 DIAGNOSIS — N1832 Chronic kidney disease, stage 3b: Secondary | ICD-10-CM | POA: Diagnosis not present

## 2023-06-12 MED ORDER — EPOETIN ALFA-EPBX 20000 UNIT/ML IJ SOLN
20000.0000 [IU] | Freq: Once | INTRAMUSCULAR | Status: AC
  Administered 2023-06-12: 20000 [IU] via SUBCUTANEOUS
  Filled 2023-06-12: qty 1

## 2023-06-12 NOTE — Patient Instructions (Signed)

## 2023-06-14 ENCOUNTER — Other Ambulatory Visit: Payer: Self-pay | Admitting: Cardiology

## 2023-06-17 NOTE — Progress Notes (Unsigned)
Cardiology Office Note:    Date:  06/18/2023   ID:  [name], DOB [birth year], MRN [id number]  PCP:  Noni Saupe, MD  Cardiologist:  Norman Herrlich, MD    Referring MD: Noni Saupe, MD    ASSESSMENT:    1. S/P TAVR (transcatheter aortic valve replacement)   2. Pacemaker   3. Complete AV block (HCC)   4. 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 Anemia in chronic kidney disease, unspecified CKD stage    PLAN:    In order of problems listed above:  Overall she is doing much better and for the first time in a long period of time her heart failure is compensated hemoglobin is improved and her quality of life is much change for the better Stable after TAVR Stable pacemaker function followed in device clinic Heart failure is compensated she will continue her current minimum dose of diuretic is background and antihypertensives including amlodipine lisinopril hydralazine and beta-blocker Much improved and stable seeing hematology   Next appointment: 3 months with Wallis Bamberg, NP   Medication Adjustments/Labs and Tests Ordered: Current medicines are reviewed at length with the patient today.  Concerns regarding medicines are outlined above.  No orders of the defined types were placed in this encounter.  No orders of the defined types were placed in this encounter.    History of Present Illness:    Kaitlin Villarreal is a 88 y.o. female with a hx of severe symptomatic aortic stenosis with TAVR June 2019 permanent pacemaker following TAVR for heart block hypertensive heart disease with heart failure and stage IIIb CKD anemia of chronic kidney disease gout and gouty arthritis last seen 12/10/2022.  She follows with hematology oncology for anemia secondary to chronic renal insufficiency and been able to maintain her hemoglobin in excess of 10.  Compliance with diet, lifestyle and medications: Yes  Her  daughter is present participates in evaluation decision making and is really providing oversight to her mother on a day-to-day occasion I asked my opinion about a safety device they do not have cell service and I told him I think an Apple Watch was the best solution for their problem Her weights are stable at home she is not short of breath she has a little bit of edema but not bothersome no orthopnea chest pain palpitation or syncope She now takes her diuretic 3 days a week I told the daughter if she thought she needed an extra dose she had my permission to give it on off days I reviewed much of her improvement to stabilization of her chronic anemia She continues to follow-up with hematology monthly  Past Medical History:  Diagnosis Date   Acute on chronic diastolic heart failure (HCC) 03/18/2018   Aortic stenosis 10/14/2014   Formatting of this note might be different from the original.  moderate by echo 11/2015   Arthritis    Bilateral renal masses 07/26/2017   Bone spur of toe of left foot    Chronic diastolic (congestive) heart failure (HCC)    Chronic diastolic heart failure (HCC) 03/18/2018   CKD (chronic kidney disease) stage 3, GFR 30-59 ml/min (HCC) 07/26/2017   Essential hypertension    GERD (gastroesophageal reflux disease)    HLD (hyperlipidemia)    Hyperlipidemia 11/25/2016   Pacemaker 09/02/2018   Pancreatic lesion 04/17/2018   Pancreatic mass    a. benign appearing but needs  9     mm Hg    --------- Mitral E/A ratio, peak                    1.2            ---------  Legend: (L)  and  (H)  mark values outside specified reference range.  ------------------------------------------------------------------- Prepared and Electronically Authenticated by  Norman Herrlich, MD 2019-04-10T13:52:39   ECHOCARDIOGRAM  ECHOCARDIOGRAM COMPLETE 08/12/2019  Narrative ECHOCARDIOGRAM REPORT    Patient Name:   Kaitlin Villarreal   Date of Exam: 08/12/2019 Medical Rec #:  [id number]     Height:       60.0 in Accession #:    [id number]    Weight:       116.0 lb Date of Birth:  04/10/1926     BSA:          1.48 m Patient Age:    89 years      BP:           130/50 mmHg Patient Gender: F             HR:           73 bpm. Exam Location:  [address]  Procedure: 2D Echo, Cardiac Doppler and Color Doppler  Indications:     Z95.2 Post TAVR evaluation  History:         Patient has prior history of Echocardiogram  examinations, most recent 02/04/2019. TAVR. Hypertensive heart disease. 3rd degree AV block. VSD. Chronic kidney disease.  Sonographer:     Cathie Beams RCS Referring Phys:  [id number] Wille Celeste THOMPSON Diagnosing Phys: Kristeen Miss MD  IMPRESSIONS   1. Left ventricular ejection fraction, by visual estimation, is 60 to 65%. The left ventricle has normal function. There is mildly increased left ventricular hypertrophy. 2. There is a small VSD with left to right shunting present. 3. Global right ventricle has normal systolic function.The right ventricular size is normal. No increase in right ventricular wall thickness. 4. Left atrial size was normal. 5. Right atrial size was normal. 6. The mitral valve is normal in structure. Mild mitral valve regurgitation. Mild mitral stenosis. 7. The tricuspid valve is grossly normal. Tricuspid valve regurgitation mild-moderate. 8. Aortic valve regurgitation is not visualized. 9. The pulmonic valve was grossly normal. Pulmonic valve regurgitation is not visualized. 10. Severely elevated pulmonary artery systolic pressure. 11. The atrial septum is grossly normal.  FINDINGS Left Ventricle: Left ventricular ejection fraction, by visual estimation, is 60 to 65%. The left ventricle has normal function. There is mildly increased left ventricular hypertrophy. There is a small VSD with left to right shunting present.  Right Ventricle: The right ventricular size is normal. No increase in right ventricular wall thickness. Global RV systolic function is has normal systolic function. The tricuspid regurgitant velocity is 4.69 m/s, and with an assumed right atrial pressure of 10 mmHg, the estimated right ventricular systolic pressure is severely elevated at 98.0 mmHg.  Left Atrium: Left atrial size was normal in size.  Right Atrium: Right atrial size was normal in size  Pericardium: There is no evidence of pericardial effusion.  Mitral Valve: The mitral valve  is normal in structure. Mild mitral valve stenosis by observation. MV peak gradient, 14.0 mmHg. Mild mitral valve regurgitation.  Tricuspid Valve: The tricuspid valve is grossly normal. Tricuspid valve regurgitation mild-moderate.  Aortic Valve: The aortic valve has been repaired/replaced. Aortic valve regurgitation is not visualized. Aortic valve mean  9     mm Hg    --------- Mitral E/A ratio, peak                    1.2            ---------  Legend: (L)  and  (H)  mark values outside specified reference range.  ------------------------------------------------------------------- Prepared and Electronically Authenticated by  Norman Herrlich, MD 2019-04-10T13:52:39   ECHOCARDIOGRAM  ECHOCARDIOGRAM COMPLETE 08/12/2019  Narrative ECHOCARDIOGRAM REPORT    Patient Name:   Kaitlin Villarreal   Date of Exam: 08/12/2019 Medical Rec #:  [id number]     Height:       60.0 in Accession #:    [id number]    Weight:       116.0 lb Date of Birth:  04/10/1926     BSA:          1.48 m Patient Age:    89 years      BP:           130/50 mmHg Patient Gender: F             HR:           73 bpm. Exam Location:  [address]  Procedure: 2D Echo, Cardiac Doppler and Color Doppler  Indications:     Z95.2 Post TAVR evaluation  History:         Patient has prior history of Echocardiogram  examinations, most recent 02/04/2019. TAVR. Hypertensive heart disease. 3rd degree AV block. VSD. Chronic kidney disease.  Sonographer:     Cathie Beams RCS Referring Phys:  [id number] Wille Celeste THOMPSON Diagnosing Phys: Kristeen Miss MD  IMPRESSIONS   1. Left ventricular ejection fraction, by visual estimation, is 60 to 65%. The left ventricle has normal function. There is mildly increased left ventricular hypertrophy. 2. There is a small VSD with left to right shunting present. 3. Global right ventricle has normal systolic function.The right ventricular size is normal. No increase in right ventricular wall thickness. 4. Left atrial size was normal. 5. Right atrial size was normal. 6. The mitral valve is normal in structure. Mild mitral valve regurgitation. Mild mitral stenosis. 7. The tricuspid valve is grossly normal. Tricuspid valve regurgitation mild-moderate. 8. Aortic valve regurgitation is not visualized. 9. The pulmonic valve was grossly normal. Pulmonic valve regurgitation is not visualized. 10. Severely elevated pulmonary artery systolic pressure. 11. The atrial septum is grossly normal.  FINDINGS Left Ventricle: Left ventricular ejection fraction, by visual estimation, is 60 to 65%. The left ventricle has normal function. There is mildly increased left ventricular hypertrophy. There is a small VSD with left to right shunting present.  Right Ventricle: The right ventricular size is normal. No increase in right ventricular wall thickness. Global RV systolic function is has normal systolic function. The tricuspid regurgitant velocity is 4.69 m/s, and with an assumed right atrial pressure of 10 mmHg, the estimated right ventricular systolic pressure is severely elevated at 98.0 mmHg.  Left Atrium: Left atrial size was normal in size.  Right Atrium: Right atrial size was normal in size  Pericardium: There is no evidence of pericardial effusion.  Mitral Valve: The mitral valve  is normal in structure. Mild mitral valve stenosis by observation. MV peak gradient, 14.0 mmHg. Mild mitral valve regurgitation.  Tricuspid Valve: The tricuspid valve is grossly normal. Tricuspid valve regurgitation mild-moderate.  Aortic Valve: The aortic valve has been repaired/replaced. Aortic valve regurgitation is not visualized. Aortic valve mean  Cardiology Office Note:    Date:  06/18/2023   ID:  [name], DOB [birth year], MRN [id number]  PCP:  Noni Saupe, MD  Cardiologist:  Norman Herrlich, MD    Referring MD: Noni Saupe, MD    ASSESSMENT:    1. S/P TAVR (transcatheter aortic valve replacement)   2. Pacemaker   3. Complete AV block (HCC)   4. 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 Anemia in chronic kidney disease, unspecified CKD stage    PLAN:    In order of problems listed above:  Overall she is doing much better and for the first time in a long period of time her heart failure is compensated hemoglobin is improved and her quality of life is much change for the better Stable after TAVR Stable pacemaker function followed in device clinic Heart failure is compensated she will continue her current minimum dose of diuretic is background and antihypertensives including amlodipine lisinopril hydralazine and beta-blocker Much improved and stable seeing hematology   Next appointment: 3 months with Wallis Bamberg, NP   Medication Adjustments/Labs and Tests Ordered: Current medicines are reviewed at length with the patient today.  Concerns regarding medicines are outlined above.  No orders of the defined types were placed in this encounter.  No orders of the defined types were placed in this encounter.    History of Present Illness:    Kaitlin Villarreal is a 88 y.o. female with a hx of severe symptomatic aortic stenosis with TAVR June 2019 permanent pacemaker following TAVR for heart block hypertensive heart disease with heart failure and stage IIIb CKD anemia of chronic kidney disease gout and gouty arthritis last seen 12/10/2022.  She follows with hematology oncology for anemia secondary to chronic renal insufficiency and been able to maintain her hemoglobin in excess of 10.  Compliance with diet, lifestyle and medications: Yes  Her  daughter is present participates in evaluation decision making and is really providing oversight to her mother on a day-to-day occasion I asked my opinion about a safety device they do not have cell service and I told him I think an Apple Watch was the best solution for their problem Her weights are stable at home she is not short of breath she has a little bit of edema but not bothersome no orthopnea chest pain palpitation or syncope She now takes her diuretic 3 days a week I told the daughter if she thought she needed an extra dose she had my permission to give it on off days I reviewed much of her improvement to stabilization of her chronic anemia She continues to follow-up with hematology monthly  Past Medical History:  Diagnosis Date   Acute on chronic diastolic heart failure (HCC) 03/18/2018   Aortic stenosis 10/14/2014   Formatting of this note might be different from the original.  moderate by echo 11/2015   Arthritis    Bilateral renal masses 07/26/2017   Bone spur of toe of left foot    Chronic diastolic (congestive) heart failure (HCC)    Chronic diastolic heart failure (HCC) 03/18/2018   CKD (chronic kidney disease) stage 3, GFR 30-59 ml/min (HCC) 07/26/2017   Essential hypertension    GERD (gastroesophageal reflux disease)    HLD (hyperlipidemia)    Hyperlipidemia 11/25/2016   Pacemaker 09/02/2018   Pancreatic lesion 04/17/2018   Pancreatic mass    a. benign appearing but needs  9     mm Hg    --------- Mitral E/A ratio, peak                    1.2            ---------  Legend: (L)  and  (H)  mark values outside specified reference range.  ------------------------------------------------------------------- Prepared and Electronically Authenticated by  Norman Herrlich, MD 2019-04-10T13:52:39   ECHOCARDIOGRAM  ECHOCARDIOGRAM COMPLETE 08/12/2019  Narrative ECHOCARDIOGRAM REPORT    Patient Name:   Kaitlin Villarreal   Date of Exam: 08/12/2019 Medical Rec #:  [id number]     Height:       60.0 in Accession #:    [id number]    Weight:       116.0 lb Date of Birth:  04/10/1926     BSA:          1.48 m Patient Age:    89 years      BP:           130/50 mmHg Patient Gender: F             HR:           73 bpm. Exam Location:  [address]  Procedure: 2D Echo, Cardiac Doppler and Color Doppler  Indications:     Z95.2 Post TAVR evaluation  History:         Patient has prior history of Echocardiogram  examinations, most recent 02/04/2019. TAVR. Hypertensive heart disease. 3rd degree AV block. VSD. Chronic kidney disease.  Sonographer:     Cathie Beams RCS Referring Phys:  [id number] Wille Celeste THOMPSON Diagnosing Phys: Kristeen Miss MD  IMPRESSIONS   1. Left ventricular ejection fraction, by visual estimation, is 60 to 65%. The left ventricle has normal function. There is mildly increased left ventricular hypertrophy. 2. There is a small VSD with left to right shunting present. 3. Global right ventricle has normal systolic function.The right ventricular size is normal. No increase in right ventricular wall thickness. 4. Left atrial size was normal. 5. Right atrial size was normal. 6. The mitral valve is normal in structure. Mild mitral valve regurgitation. Mild mitral stenosis. 7. The tricuspid valve is grossly normal. Tricuspid valve regurgitation mild-moderate. 8. Aortic valve regurgitation is not visualized. 9. The pulmonic valve was grossly normal. Pulmonic valve regurgitation is not visualized. 10. Severely elevated pulmonary artery systolic pressure. 11. The atrial septum is grossly normal.  FINDINGS Left Ventricle: Left ventricular ejection fraction, by visual estimation, is 60 to 65%. The left ventricle has normal function. There is mildly increased left ventricular hypertrophy. There is a small VSD with left to right shunting present.  Right Ventricle: The right ventricular size is normal. No increase in right ventricular wall thickness. Global RV systolic function is has normal systolic function. The tricuspid regurgitant velocity is 4.69 m/s, and with an assumed right atrial pressure of 10 mmHg, the estimated right ventricular systolic pressure is severely elevated at 98.0 mmHg.  Left Atrium: Left atrial size was normal in size.  Right Atrium: Right atrial size was normal in size  Pericardium: There is no evidence of pericardial effusion.  Mitral Valve: The mitral valve  is normal in structure. Mild mitral valve stenosis by observation. MV peak gradient, 14.0 mmHg. Mild mitral valve regurgitation.  Tricuspid Valve: The tricuspid valve is grossly normal. Tricuspid valve regurgitation mild-moderate.  Aortic Valve: The aortic valve has been repaired/replaced. Aortic valve regurgitation is not visualized. Aortic valve mean  Cardiology Office Note:    Date:  06/18/2023   ID:  [name], DOB [birth year], MRN [id number]  PCP:  Noni Saupe, MD  Cardiologist:  Norman Herrlich, MD    Referring MD: Noni Saupe, MD    ASSESSMENT:    1. S/P TAVR (transcatheter aortic valve replacement)   2. Pacemaker   3. Complete AV block (HCC)   4. 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 Anemia in chronic kidney disease, unspecified CKD stage    PLAN:    In order of problems listed above:  Overall she is doing much better and for the first time in a long period of time her heart failure is compensated hemoglobin is improved and her quality of life is much change for the better Stable after TAVR Stable pacemaker function followed in device clinic Heart failure is compensated she will continue her current minimum dose of diuretic is background and antihypertensives including amlodipine lisinopril hydralazine and beta-blocker Much improved and stable seeing hematology   Next appointment: 3 months with Wallis Bamberg, NP   Medication Adjustments/Labs and Tests Ordered: Current medicines are reviewed at length with the patient today.  Concerns regarding medicines are outlined above.  No orders of the defined types were placed in this encounter.  No orders of the defined types were placed in this encounter.    History of Present Illness:    Kaitlin Villarreal is a 88 y.o. female with a hx of severe symptomatic aortic stenosis with TAVR June 2019 permanent pacemaker following TAVR for heart block hypertensive heart disease with heart failure and stage IIIb CKD anemia of chronic kidney disease gout and gouty arthritis last seen 12/10/2022.  She follows with hematology oncology for anemia secondary to chronic renal insufficiency and been able to maintain her hemoglobin in excess of 10.  Compliance with diet, lifestyle and medications: Yes  Her  daughter is present participates in evaluation decision making and is really providing oversight to her mother on a day-to-day occasion I asked my opinion about a safety device they do not have cell service and I told him I think an Apple Watch was the best solution for their problem Her weights are stable at home she is not short of breath she has a little bit of edema but not bothersome no orthopnea chest pain palpitation or syncope She now takes her diuretic 3 days a week I told the daughter if she thought she needed an extra dose she had my permission to give it on off days I reviewed much of her improvement to stabilization of her chronic anemia She continues to follow-up with hematology monthly  Past Medical History:  Diagnosis Date   Acute on chronic diastolic heart failure (HCC) 03/18/2018   Aortic stenosis 10/14/2014   Formatting of this note might be different from the original.  moderate by echo 11/2015   Arthritis    Bilateral renal masses 07/26/2017   Bone spur of toe of left foot    Chronic diastolic (congestive) heart failure (HCC)    Chronic diastolic heart failure (HCC) 03/18/2018   CKD (chronic kidney disease) stage 3, GFR 30-59 ml/min (HCC) 07/26/2017   Essential hypertension    GERD (gastroesophageal reflux disease)    HLD (hyperlipidemia)    Hyperlipidemia 11/25/2016   Pacemaker 09/02/2018   Pancreatic lesion 04/17/2018   Pancreatic mass    a. benign appearing but needs  Cardiology Office Note:    Date:  06/18/2023   ID:  [name], DOB [birth year], MRN [id number]  PCP:  Noni Saupe, MD  Cardiologist:  Norman Herrlich, MD    Referring MD: Noni Saupe, MD    ASSESSMENT:    1. S/P TAVR (transcatheter aortic valve replacement)   2. Pacemaker   3. Complete AV block (HCC)   4. 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 Anemia in chronic kidney disease, unspecified CKD stage    PLAN:    In order of problems listed above:  Overall she is doing much better and for the first time in a long period of time her heart failure is compensated hemoglobin is improved and her quality of life is much change for the better Stable after TAVR Stable pacemaker function followed in device clinic Heart failure is compensated she will continue her current minimum dose of diuretic is background and antihypertensives including amlodipine lisinopril hydralazine and beta-blocker Much improved and stable seeing hematology   Next appointment: 3 months with Wallis Bamberg, NP   Medication Adjustments/Labs and Tests Ordered: Current medicines are reviewed at length with the patient today.  Concerns regarding medicines are outlined above.  No orders of the defined types were placed in this encounter.  No orders of the defined types were placed in this encounter.    History of Present Illness:    Kaitlin Villarreal is a 88 y.o. female with a hx of severe symptomatic aortic stenosis with TAVR June 2019 permanent pacemaker following TAVR for heart block hypertensive heart disease with heart failure and stage IIIb CKD anemia of chronic kidney disease gout and gouty arthritis last seen 12/10/2022.  She follows with hematology oncology for anemia secondary to chronic renal insufficiency and been able to maintain her hemoglobin in excess of 10.  Compliance with diet, lifestyle and medications: Yes  Her  daughter is present participates in evaluation decision making and is really providing oversight to her mother on a day-to-day occasion I asked my opinion about a safety device they do not have cell service and I told him I think an Apple Watch was the best solution for their problem Her weights are stable at home she is not short of breath she has a little bit of edema but not bothersome no orthopnea chest pain palpitation or syncope She now takes her diuretic 3 days a week I told the daughter if she thought she needed an extra dose she had my permission to give it on off days I reviewed much of her improvement to stabilization of her chronic anemia She continues to follow-up with hematology monthly  Past Medical History:  Diagnosis Date   Acute on chronic diastolic heart failure (HCC) 03/18/2018   Aortic stenosis 10/14/2014   Formatting of this note might be different from the original.  moderate by echo 11/2015   Arthritis    Bilateral renal masses 07/26/2017   Bone spur of toe of left foot    Chronic diastolic (congestive) heart failure (HCC)    Chronic diastolic heart failure (HCC) 03/18/2018   CKD (chronic kidney disease) stage 3, GFR 30-59 ml/min (HCC) 07/26/2017   Essential hypertension    GERD (gastroesophageal reflux disease)    HLD (hyperlipidemia)    Hyperlipidemia 11/25/2016   Pacemaker 09/02/2018   Pancreatic lesion 04/17/2018   Pancreatic mass    a. benign appearing but needs  9     mm Hg    --------- Mitral E/A ratio, peak                    1.2            ---------  Legend: (L)  and  (H)  mark values outside specified reference range.  ------------------------------------------------------------------- Prepared and Electronically Authenticated by  Norman Herrlich, MD 2019-04-10T13:52:39   ECHOCARDIOGRAM  ECHOCARDIOGRAM COMPLETE 08/12/2019  Narrative ECHOCARDIOGRAM REPORT    Patient Name:   Kaitlin Villarreal   Date of Exam: 08/12/2019 Medical Rec #:  [id number]     Height:       60.0 in Accession #:    [id number]    Weight:       116.0 lb Date of Birth:  04/10/1926     BSA:          1.48 m Patient Age:    89 years      BP:           130/50 mmHg Patient Gender: F             HR:           73 bpm. Exam Location:  [address]  Procedure: 2D Echo, Cardiac Doppler and Color Doppler  Indications:     Z95.2 Post TAVR evaluation  History:         Patient has prior history of Echocardiogram  examinations, most recent 02/04/2019. TAVR. Hypertensive heart disease. 3rd degree AV block. VSD. Chronic kidney disease.  Sonographer:     Cathie Beams RCS Referring Phys:  [id number] Wille Celeste [facility] Diagnosing Phys: Kristeen Miss MD  IMPRESSIONS   1. Left ventricular ejection fraction, by visual estimation, is 60 to 65%. The left ventricle has normal function. There is mildly increased left ventricular hypertrophy. 2. There is a small VSD with left to right shunting present. 3. Global right ventricle has normal systolic function.The right ventricular size is normal. No increase in right ventricular wall thickness. 4. Left atrial size was normal. 5. Right atrial size was normal. 6. The mitral valve is normal in structure. Mild mitral valve regurgitation. Mild mitral stenosis. 7. The tricuspid valve is grossly normal. Tricuspid valve regurgitation mild-moderate. 8. Aortic valve regurgitation is not visualized. 9. The pulmonic valve was grossly normal. Pulmonic valve regurgitation is not visualized. 10. Severely elevated pulmonary artery systolic pressure. 11. The atrial septum is grossly normal.  FINDINGS Left Ventricle: Left ventricular ejection fraction, by visual estimation, is 60 to 65%. The left ventricle has normal function. There is mildly increased left ventricular hypertrophy. There is a small VSD with left to right shunting present.  Right Ventricle: The right ventricular size is normal. No increase in right ventricular wall thickness. Global RV systolic function is has normal systolic function. The tricuspid regurgitant velocity is 4.69 m/s, and with an assumed right atrial pressure of 10 mmHg, the estimated right ventricular systolic pressure is severely elevated at 98.0 mmHg.  Left Atrium: Left atrial size was normal in size.  Right Atrium: Right atrial size was normal in size  Pericardium: There is no evidence of pericardial effusion.  Mitral Valve: The mitral valve  is normal in structure. Mild mitral valve stenosis by observation. MV peak gradient, 14.0 mmHg. Mild mitral valve regurgitation.  Tricuspid Valve: The tricuspid valve is grossly normal. Tricuspid valve regurgitation mild-moderate.  Aortic Valve: The aortic valve has been repaired/replaced. Aortic valve regurgitation is not visualized. Aortic valve mean  9     mm Hg    --------- Mitral E/A ratio, peak                    1.2            ---------  Legend: (L)  and  (H)  mark values outside specified reference range.  ------------------------------------------------------------------- Prepared and Electronically Authenticated by  Norman Herrlich, MD 2019-04-10T13:52:39   ECHOCARDIOGRAM  ECHOCARDIOGRAM COMPLETE 08/12/2019  Narrative ECHOCARDIOGRAM REPORT    Patient Name:   Kaitlin Villarreal   Date of Exam: 08/12/2019 Medical Rec #:  [id number]     Height:       60.0 in Accession #:    [id number]    Weight:       116.0 lb Date of Birth:  04/10/1926     BSA:          1.48 m Patient Age:    89 years      BP:           130/50 mmHg Patient Gender: F             HR:           73 bpm. Exam Location:  [address]  Procedure: 2D Echo, Cardiac Doppler and Color Doppler  Indications:     Z95.2 Post TAVR evaluation  History:         Patient has prior history of Echocardiogram  examinations, most recent 02/04/2019. TAVR. Hypertensive heart disease. 3rd degree AV block. VSD. Chronic kidney disease.  Sonographer:     Cathie Beams RCS Referring Phys:  [id number] Wille Celeste [facility] Diagnosing Phys: Kristeen Miss MD  IMPRESSIONS   1. Left ventricular ejection fraction, by visual estimation, is 60 to 65%. The left ventricle has normal function. There is mildly increased left ventricular hypertrophy. 2. There is a small VSD with left to right shunting present. 3. Global right ventricle has normal systolic function.The right ventricular size is normal. No increase in right ventricular wall thickness. 4. Left atrial size was normal. 5. Right atrial size was normal. 6. The mitral valve is normal in structure. Mild mitral valve regurgitation. Mild mitral stenosis. 7. The tricuspid valve is grossly normal. Tricuspid valve regurgitation mild-moderate. 8. Aortic valve regurgitation is not visualized. 9. The pulmonic valve was grossly normal. Pulmonic valve regurgitation is not visualized. 10. Severely elevated pulmonary artery systolic pressure. 11. The atrial septum is grossly normal.  FINDINGS Left Ventricle: Left ventricular ejection fraction, by visual estimation, is 60 to 65%. The left ventricle has normal function. There is mildly increased left ventricular hypertrophy. There is a small VSD with left to right shunting present.  Right Ventricle: The right ventricular size is normal. No increase in right ventricular wall thickness. Global RV systolic function is has normal systolic function. The tricuspid regurgitant velocity is 4.69 m/s, and with an assumed right atrial pressure of 10 mmHg, the estimated right ventricular systolic pressure is severely elevated at 98.0 mmHg.  Left Atrium: Left atrial size was normal in size.  Right Atrium: Right atrial size was normal in size  Pericardium: There is no evidence of pericardial effusion.  Mitral Valve: The mitral valve  is normal in structure. Mild mitral valve stenosis by observation. MV peak gradient, 14.0 mmHg. Mild mitral valve regurgitation.  Tricuspid Valve: The tricuspid valve is grossly normal. Tricuspid valve regurgitation mild-moderate.  Aortic Valve: The aortic valve has been repaired/replaced. Aortic valve regurgitation is not visualized. Aortic valve mean  9     mm Hg    --------- Mitral E/A ratio, peak                    1.2            ---------  Legend: (L)  and  (H)  mark values outside specified reference range.  ------------------------------------------------------------------- Prepared and Electronically Authenticated by  Norman Herrlich, MD 2019-04-10T13:52:39   ECHOCARDIOGRAM  ECHOCARDIOGRAM COMPLETE 08/12/2019  Narrative ECHOCARDIOGRAM REPORT    Patient Name:   Kaitlin Villarreal   Date of Exam: 08/12/2019 Medical Rec #:  [id number]     Height:       60.0 in Accession #:    [id number]    Weight:       116.0 lb Date of Birth:  04/10/1926     BSA:          1.48 m Patient Age:    89 years      BP:           130/50 mmHg Patient Gender: F             HR:           73 bpm. Exam Location:  [address]  Procedure: 2D Echo, Cardiac Doppler and Color Doppler  Indications:     Z95.2 Post TAVR evaluation  History:         Patient has prior history of Echocardiogram  examinations, most recent 02/04/2019. TAVR. Hypertensive heart disease. 3rd degree AV block. VSD. Chronic kidney disease.  Sonographer:     Cathie Beams RCS Referring Phys:  [id number] Wille Celeste [facility] Diagnosing Phys: Kristeen Miss MD  IMPRESSIONS   1. Left ventricular ejection fraction, by visual estimation, is 60 to 65%. The left ventricle has normal function. There is mildly increased left ventricular hypertrophy. 2. There is a small VSD with left to right shunting present. 3. Global right ventricle has normal systolic function.The right ventricular size is normal. No increase in right ventricular wall thickness. 4. Left atrial size was normal. 5. Right atrial size was normal. 6. The mitral valve is normal in structure. Mild mitral valve regurgitation. Mild mitral stenosis. 7. The tricuspid valve is grossly normal. Tricuspid valve regurgitation mild-moderate. 8. Aortic valve regurgitation is not visualized. 9. The pulmonic valve was grossly normal. Pulmonic valve regurgitation is not visualized. 10. Severely elevated pulmonary artery systolic pressure. 11. The atrial septum is grossly normal.  FINDINGS Left Ventricle: Left ventricular ejection fraction, by visual estimation, is 60 to 65%. The left ventricle has normal function. There is mildly increased left ventricular hypertrophy. There is a small VSD with left to right shunting present.  Right Ventricle: The right ventricular size is normal. No increase in right ventricular wall thickness. Global RV systolic function is has normal systolic function. The tricuspid regurgitant velocity is 4.69 m/s, and with an assumed right atrial pressure of 10 mmHg, the estimated right ventricular systolic pressure is severely elevated at 98.0 mmHg.  Left Atrium: Left atrial size was normal in size.  Right Atrium: Right atrial size was normal in size  Pericardium: There is no evidence of pericardial effusion.  Mitral Valve: The mitral valve  is normal in structure. Mild mitral valve stenosis by observation. MV peak gradient, 14.0 mmHg. Mild mitral valve regurgitation.  Tricuspid Valve: The tricuspid valve is grossly normal. Tricuspid valve regurgitation mild-moderate.  Aortic Valve: The aortic valve has been repaired/replaced. Aortic valve regurgitation is not visualized. Aortic valve mean  9     mm Hg    --------- Mitral E/A ratio, peak                    1.2            ---------  Legend: (L)  and  (H)  mark values outside specified reference range.  ------------------------------------------------------------------- Prepared and Electronically Authenticated by  Norman Herrlich, MD 2019-04-10T13:52:39   ECHOCARDIOGRAM  ECHOCARDIOGRAM COMPLETE 08/12/2019  Narrative ECHOCARDIOGRAM REPORT    Patient Name:   Kaitlin Villarreal   Date of Exam: 08/12/2019 Medical Rec #:  [id number]     Height:       60.0 in Accession #:    [id number]    Weight:       116.0 lb Date of Birth:  04/10/1926     BSA:          1.48 m Patient Age:    89 years      BP:           130/50 mmHg Patient Gender: F             HR:           73 bpm. Exam Location:  [address]  Procedure: 2D Echo, Cardiac Doppler and Color Doppler  Indications:     Z95.2 Post TAVR evaluation  History:         Patient has prior history of Echocardiogram  examinations, most recent 02/04/2019. TAVR. Hypertensive heart disease. 3rd degree AV block. VSD. Chronic kidney disease.  Sonographer:     Cathie Beams RCS Referring Phys:  [id number] Wille Celeste [facility] Diagnosing Phys: Kristeen Miss MD  IMPRESSIONS   1. Left ventricular ejection fraction, by visual estimation, is 60 to 65%. The left ventricle has normal function. There is mildly increased left ventricular hypertrophy. 2. There is a small VSD with left to right shunting present. 3. Global right ventricle has normal systolic function.The right ventricular size is normal. No increase in right ventricular wall thickness. 4. Left atrial size was normal. 5. Right atrial size was normal. 6. The mitral valve is normal in structure. Mild mitral valve regurgitation. Mild mitral stenosis. 7. The tricuspid valve is grossly normal. Tricuspid valve regurgitation mild-moderate. 8. Aortic valve regurgitation is not visualized. 9. The pulmonic valve was grossly normal. Pulmonic valve regurgitation is not visualized. 10. Severely elevated pulmonary artery systolic pressure. 11. The atrial septum is grossly normal.  FINDINGS Left Ventricle: Left ventricular ejection fraction, by visual estimation, is 60 to 65%. The left ventricle has normal function. There is mildly increased left ventricular hypertrophy. There is a small VSD with left to right shunting present.  Right Ventricle: The right ventricular size is normal. No increase in right ventricular wall thickness. Global RV systolic function is has normal systolic function. The tricuspid regurgitant velocity is 4.69 m/s, and with an assumed right atrial pressure of 10 mmHg, the estimated right ventricular systolic pressure is severely elevated at 98.0 mmHg.  Left Atrium: Left atrial size was normal in size.  Right Atrium: Right atrial size was normal in size  Pericardium: There is no evidence of pericardial effusion.  Mitral Valve: The mitral valve  is normal in structure. Mild mitral valve stenosis by observation. MV peak gradient, 14.0 mmHg. Mild mitral valve regurgitation.  Tricuspid Valve: The tricuspid valve is grossly normal. Tricuspid valve regurgitation mild-moderate.  Aortic Valve: The aortic valve has been repaired/replaced. Aortic valve regurgitation is not visualized. Aortic valve mean  Cardiology Office Note:    Date:  06/18/2023   ID:  [name], DOB [birth year], MRN [id number]  PCP:  Noni Saupe, MD  Cardiologist:  Norman Herrlich, MD    Referring MD: Noni Saupe, MD    ASSESSMENT:    1. S/P TAVR (transcatheter aortic valve replacement)   2. Pacemaker   3. Complete AV block (HCC)   4. 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 Anemia in chronic kidney disease, unspecified CKD stage    PLAN:    In order of problems listed above:  Overall she is doing much better and for the first time in a long period of time her heart failure is compensated hemoglobin is improved and her quality of life is much change for the better Stable after TAVR Stable pacemaker function followed in device clinic Heart failure is compensated she will continue her current minimum dose of diuretic is background and antihypertensives including amlodipine lisinopril hydralazine and beta-blocker Much improved and stable seeing hematology   Next appointment: 3 months with Wallis Bamberg, NP   Medication Adjustments/Labs and Tests Ordered: Current medicines are reviewed at length with the patient today.  Concerns regarding medicines are outlined above.  No orders of the defined types were placed in this encounter.  No orders of the defined types were placed in this encounter.    History of Present Illness:    Kaitlin Villarreal is a 88 y.o. female with a hx of severe symptomatic aortic stenosis with TAVR June 2019 permanent pacemaker following TAVR for heart block hypertensive heart disease with heart failure and stage IIIb CKD anemia of chronic kidney disease gout and gouty arthritis last seen 12/10/2022.  She follows with hematology oncology for anemia secondary to chronic renal insufficiency and been able to maintain her hemoglobin in excess of 10.  Compliance with diet, lifestyle and medications: Yes  Her  daughter is present participates in evaluation decision making and is really providing oversight to her mother on a day-to-day occasion I asked my opinion about a safety device they do not have cell service and I told him I think an Apple Watch was the best solution for their problem Her weights are stable at home she is not short of breath she has a little bit of edema but not bothersome no orthopnea chest pain palpitation or syncope She now takes her diuretic 3 days a week I told the daughter if she thought she needed an extra dose she had my permission to give it on off days I reviewed much of her improvement to stabilization of her chronic anemia She continues to follow-up with hematology monthly  Past Medical History:  Diagnosis Date   Acute on chronic diastolic heart failure (HCC) 03/18/2018   Aortic stenosis 10/14/2014   Formatting of this note might be different from the original.  moderate by echo 11/2015   Arthritis    Bilateral renal masses 07/26/2017   Bone spur of toe of left foot    Chronic diastolic (congestive) heart failure (HCC)    Chronic diastolic heart failure (HCC) 03/18/2018   CKD (chronic kidney disease) stage 3, GFR 30-59 ml/min (HCC) 07/26/2017   Essential hypertension    GERD (gastroesophageal reflux disease)    HLD (hyperlipidemia)    Hyperlipidemia 11/25/2016   Pacemaker 09/02/2018   Pancreatic lesion 04/17/2018   Pancreatic mass    a. benign appearing but needs  9     mm Hg    --------- Mitral E/A ratio, peak                    1.2            ---------  Legend: (L)  and  (H)  mark values outside specified reference range.  ------------------------------------------------------------------- Prepared and Electronically Authenticated by  Norman Herrlich, MD 2019-04-10T13:52:39   ECHOCARDIOGRAM  ECHOCARDIOGRAM COMPLETE 08/12/2019  Narrative ECHOCARDIOGRAM REPORT    Patient Name:   Kaitlin Villarreal   Date of Exam: 08/12/2019 Medical Rec #:  [id number]     Height:       60.0 in Accession #:    [id number]    Weight:       116.0 lb Date of Birth:  04/10/1926     BSA:          1.48 m Patient Age:    89 years      BP:           130/50 mmHg Patient Gender: F             HR:           73 bpm. Exam Location:  [address]  Procedure: 2D Echo, Cardiac Doppler and Color Doppler  Indications:     Z95.2 Post TAVR evaluation  History:         Patient has prior history of Echocardiogram  examinations, most recent 02/04/2019. TAVR. Hypertensive heart disease. 3rd degree AV block. VSD. Chronic kidney disease.  Sonographer:     Cathie Beams RCS Referring Phys:  [id number] Wille Celeste [facility] Diagnosing Phys: Kristeen Miss MD  IMPRESSIONS   1. Left ventricular ejection fraction, by visual estimation, is 60 to 65%. The left ventricle has normal function. There is mildly increased left ventricular hypertrophy. 2. There is a small VSD with left to right shunting present. 3. Global right ventricle has normal systolic function.The right ventricular size is normal. No increase in right ventricular wall thickness. 4. Left atrial size was normal. 5. Right atrial size was normal. 6. The mitral valve is normal in structure. Mild mitral valve regurgitation. Mild mitral stenosis. 7. The tricuspid valve is grossly normal. Tricuspid valve regurgitation mild-moderate. 8. Aortic valve regurgitation is not visualized. 9. The pulmonic valve was grossly normal. Pulmonic valve regurgitation is not visualized. 10. Severely elevated pulmonary artery systolic pressure. 11. The atrial septum is grossly normal.  FINDINGS Left Ventricle: Left ventricular ejection fraction, by visual estimation, is 60 to 65%. The left ventricle has normal function. There is mildly increased left ventricular hypertrophy. There is a small VSD with left to right shunting present.  Right Ventricle: The right ventricular size is normal. No increase in right ventricular wall thickness. Global RV systolic function is has normal systolic function. The tricuspid regurgitant velocity is 4.69 m/s, and with an assumed right atrial pressure of 10 mmHg, the estimated right ventricular systolic pressure is severely elevated at 98.0 mmHg.  Left Atrium: Left atrial size was normal in size.  Right Atrium: Right atrial size was normal in size  Pericardium: There is no evidence of pericardial effusion.  Mitral Valve: The mitral valve  is normal in structure. Mild mitral valve stenosis by observation. MV peak gradient, 14.0 mmHg. Mild mitral valve regurgitation.  Tricuspid Valve: The tricuspid valve is grossly normal. Tricuspid valve regurgitation mild-moderate.  Aortic Valve: The aortic valve has been repaired/replaced. Aortic valve regurgitation is not visualized. Aortic valve mean  9     mm Hg    --------- Mitral E/A ratio, peak                    1.2            ---------  Legend: (L)  and  (H)  mark values outside specified reference range.  ------------------------------------------------------------------- Prepared and Electronically Authenticated by  Norman Herrlich, MD 2019-04-10T13:52:39   ECHOCARDIOGRAM  ECHOCARDIOGRAM COMPLETE 08/12/2019  Narrative ECHOCARDIOGRAM REPORT    Patient Name:   Kaitlin Villarreal   Date of Exam: 08/12/2019 Medical Rec #:  [id number]     Height:       60.0 in Accession #:    [id number]    Weight:       116.0 lb Date of Birth:  04/10/1926     BSA:          1.48 m Patient Age:    89 years      BP:           130/50 mmHg Patient Gender: F             HR:           73 bpm. Exam Location:  [address]  Procedure: 2D Echo, Cardiac Doppler and Color Doppler  Indications:     Z95.2 Post TAVR evaluation  History:         Patient has prior history of Echocardiogram  examinations, most recent 02/04/2019. TAVR. Hypertensive heart disease. 3rd degree AV block. VSD. Chronic kidney disease.  Sonographer:     Cathie Beams RCS Referring Phys:  [id number] Wille Celeste [facility] Diagnosing Phys: Kristeen Miss MD  IMPRESSIONS   1. Left ventricular ejection fraction, by visual estimation, is 60 to 65%. The left ventricle has normal function. There is mildly increased left ventricular hypertrophy. 2. There is a small VSD with left to right shunting present. 3. Global right ventricle has normal systolic function.The right ventricular size is normal. No increase in right ventricular wall thickness. 4. Left atrial size was normal. 5. Right atrial size was normal. 6. The mitral valve is normal in structure. Mild mitral valve regurgitation. Mild mitral stenosis. 7. The tricuspid valve is grossly normal. Tricuspid valve regurgitation mild-moderate. 8. Aortic valve regurgitation is not visualized. 9. The pulmonic valve was grossly normal. Pulmonic valve regurgitation is not visualized. 10. Severely elevated pulmonary artery systolic pressure. 11. The atrial septum is grossly normal.  FINDINGS Left Ventricle: Left ventricular ejection fraction, by visual estimation, is 60 to 65%. The left ventricle has normal function. There is mildly increased left ventricular hypertrophy. There is a small VSD with left to right shunting present.  Right Ventricle: The right ventricular size is normal. No increase in right ventricular wall thickness. Global RV systolic function is has normal systolic function. The tricuspid regurgitant velocity is 4.69 m/s, and with an assumed right atrial pressure of 10 mmHg, the estimated right ventricular systolic pressure is severely elevated at 98.0 mmHg.  Left Atrium: Left atrial size was normal in size.  Right Atrium: Right atrial size was normal in size  Pericardium: There is no evidence of pericardial effusion.  Mitral Valve: The mitral valve  is normal in structure. Mild mitral valve stenosis by observation. MV peak gradient, 14.0 mmHg. Mild mitral valve regurgitation.  Tricuspid Valve: The tricuspid valve is grossly normal. Tricuspid valve regurgitation mild-moderate.  Aortic Valve: The aortic valve has been repaired/replaced. Aortic valve regurgitation is not visualized. Aortic valve mean

## 2023-06-18 ENCOUNTER — Encounter: Payer: Self-pay | Admitting: Cardiology

## 2023-06-18 ENCOUNTER — Ambulatory Visit: Payer: Medicare Other | Attending: Cardiology | Admitting: Cardiology

## 2023-06-18 VITALS — BP 142/60 | HR 62 | Ht 60.0 in | Wt 101.8 lb

## 2023-06-18 DIAGNOSIS — I442 Atrioventricular block, complete: Secondary | ICD-10-CM | POA: Insufficient documentation

## 2023-06-18 DIAGNOSIS — N189 Chronic kidney disease, unspecified: Secondary | ICD-10-CM | POA: Diagnosis not present

## 2023-06-18 DIAGNOSIS — Z952 Presence of prosthetic heart valve: Secondary | ICD-10-CM | POA: Insufficient documentation

## 2023-06-18 DIAGNOSIS — Z950 Presence of cardiac pacemaker: Secondary | ICD-10-CM | POA: Insufficient documentation

## 2023-06-18 DIAGNOSIS — N183 Chronic kidney disease, stage 3 unspecified: Secondary | ICD-10-CM | POA: Diagnosis not present

## 2023-06-18 DIAGNOSIS — D631 Anemia in chronic kidney disease: Secondary | ICD-10-CM | POA: Diagnosis not present

## 2023-06-18 DIAGNOSIS — I130 Hypertensive heart and chronic kidney disease with heart failure and stage 1 through stage 4 chronic kidney disease, or unspecified chronic kidney disease: Secondary | ICD-10-CM | POA: Insufficient documentation

## 2023-06-18 NOTE — Patient Instructions (Signed)
Medication Instructions:  Your physician recommends that you continue on your current medications as directed. Please refer to the Current Medication list given to you today.  *If you need a refill on your cardiac medications before your next appointment, please call your pharmacy*   Lab Work: None If you have labs (blood work) drawn today and your tests are completely normal, you will receive your results only by: MyChart Message (if you have MyChart) OR A paper copy in the mail If you have any lab test that is abnormal or we need to change your treatment, we will call you to review the results.   Testing/Procedures: None   Follow-Up: At [facility], you and your health needs are our priority.  As part of our continuing mission to provide you with exceptional heart care, we have created designated Provider Care Teams.  These Care Teams include your primary Cardiologist (physician) and Advanced Practice Providers (APPs -  Physician Assistants and Nurse Practitioners) who all work together to provide you with the care you need, when you need it.  We recommend signing up for the patient portal called "MyChart".  Sign up information is provided on this After Visit Summary.  MyChart is used to connect with patients for Virtual Visits (Telemedicine).  Patients are able to view lab/test results, encounter notes, upcoming appointments, etc.  Non-urgent messages can be sent to your provider as well.   To learn more about what you can do with MyChart, go to [email or web address].    Your next appointment:   3 month(s)  Provider:   Wallis Bamberg, NP  Other Instructions None

## 2023-06-19 ENCOUNTER — Ambulatory Visit (INDEPENDENT_AMBULATORY_CARE_PROVIDER_SITE_OTHER): Payer: Medicare Other

## 2023-06-19 DIAGNOSIS — I442 Atrioventricular block, complete: Secondary | ICD-10-CM | POA: Diagnosis not present

## 2023-06-19 LAB — CUP PACEART REMOTE DEVICE CHECK
Battery Remaining Longevity: 70 mo
Battery Remaining Percentage: 58 %
Battery Voltage: 2.99 V
Brady Statistic AP VP Percent: 1 %
Brady Statistic AP VS Percent: 45 %
Brady Statistic AS VP Percent: 1 %
Brady Statistic AS VS Percent: 55 %
Brady Statistic RA Percent Paced: 44 %
Brady Statistic RV Percent Paced: 1 %
Date Time Interrogation Session: 20240918030144
Implantable Lead Connection Status: 753985
Implantable Lead Connection Status: 753985
Implantable Lead Implant Date: 20190620
Implantable Lead Implant Date: 20190620
Implantable Lead Location: 753859
Implantable Lead Location: 753860
Implantable Pulse Generator Implant Date: 20190620
Lead Channel Impedance Value: 350 Ohm
Lead Channel Impedance Value: 430 Ohm
Lead Channel Pacing Threshold Amplitude: 0.75 V
Lead Channel Pacing Threshold Amplitude: 0.875 V
Lead Channel Pacing Threshold Pulse Width: 0.5 ms
Lead Channel Pacing Threshold Pulse Width: 0.5 ms
Lead Channel Sensing Intrinsic Amplitude: 2.6 mV
Lead Channel Sensing Intrinsic Amplitude: 8.6 mV
Lead Channel Setting Pacing Amplitude: 1.125
Lead Channel Setting Pacing Amplitude: 2 V
Lead Channel Setting Pacing Pulse Width: 0.5 ms
Lead Channel Setting Sensing Sensitivity: 2 mV
Pulse Gen Model: 2272
Pulse Gen Serial Number: 9035930

## 2023-06-25 NOTE — Progress Notes (Signed)
Remote pacemaker transmission.   

## 2023-06-28 DIAGNOSIS — J069 Acute upper respiratory infection, unspecified: Secondary | ICD-10-CM | POA: Diagnosis not present

## 2023-06-28 DIAGNOSIS — Z6820 Body mass index (BMI) 20.0-20.9, adult: Secondary | ICD-10-CM | POA: Diagnosis not present

## 2023-06-28 DIAGNOSIS — R197 Diarrhea, unspecified: Secondary | ICD-10-CM | POA: Diagnosis not present

## 2023-07-04 NOTE — Progress Notes (Signed)
Remote pacemaker transmission.   

## 2023-07-08 ENCOUNTER — Inpatient Hospital Stay: Payer: Medicare Other | Attending: Oncology

## 2023-07-08 DIAGNOSIS — D631 Anemia in chronic kidney disease: Secondary | ICD-10-CM | POA: Insufficient documentation

## 2023-07-08 DIAGNOSIS — N1832 Chronic kidney disease, stage 3b: Secondary | ICD-10-CM | POA: Insufficient documentation

## 2023-07-08 LAB — CBC WITH DIFFERENTIAL (CANCER CENTER ONLY)
Abs Immature Granulocytes: 0.06 10*3/uL (ref 0.00–0.07)
Basophils Absolute: 0.1 10*3/uL (ref 0.0–0.1)
Basophils Relative: 1 %
Eosinophils Absolute: 0.1 10*3/uL (ref 0.0–0.5)
Eosinophils Relative: 1 %
HCT: 34.3 % — ABNORMAL LOW (ref 36.0–46.0)
Hemoglobin: 10.5 g/dL — ABNORMAL LOW (ref 12.0–15.0)
Immature Granulocytes: 1 %
Lymphocytes Relative: 18 %
Lymphs Abs: 1.6 10*3/uL (ref 0.7–4.0)
MCH: 31.7 pg (ref 26.0–34.0)
MCHC: 30.6 g/dL (ref 30.0–36.0)
MCV: 103.6 fL — ABNORMAL HIGH (ref 80.0–100.0)
Monocytes Absolute: 0.6 10*3/uL (ref 0.1–1.0)
Monocytes Relative: 6 %
Neutro Abs: 6.8 10*3/uL (ref 1.7–7.7)
Neutrophils Relative %: 73 %
Platelet Count: 212 10*3/uL (ref 150–400)
RBC: 3.31 MIL/uL — ABNORMAL LOW (ref 3.87–5.11)
RDW: 14.5 % (ref 11.5–15.5)
WBC Count: 9.2 10*3/uL (ref 4.0–10.5)
nRBC: 0 % (ref 0.0–0.2)

## 2023-07-09 ENCOUNTER — Telehealth: Payer: Self-pay | Admitting: Oncology

## 2023-07-09 NOTE — Telephone Encounter (Signed)
07/09/23 Spoke with patients daughter and cancelled injection.

## 2023-07-10 ENCOUNTER — Inpatient Hospital Stay: Payer: Medicare Other

## 2023-07-28 ENCOUNTER — Other Ambulatory Visit: Payer: Self-pay | Admitting: Cardiology

## 2023-08-05 ENCOUNTER — Inpatient Hospital Stay: Payer: Medicare Other | Attending: Oncology

## 2023-08-05 DIAGNOSIS — N1832 Chronic kidney disease, stage 3b: Secondary | ICD-10-CM | POA: Insufficient documentation

## 2023-08-05 DIAGNOSIS — D631 Anemia in chronic kidney disease: Secondary | ICD-10-CM | POA: Insufficient documentation

## 2023-08-05 LAB — CBC WITH DIFFERENTIAL (CANCER CENTER ONLY)
Abs Immature Granulocytes: 0.07 10*3/uL (ref 0.00–0.07)
Basophils Absolute: 0 10*3/uL (ref 0.0–0.1)
Basophils Relative: 0 %
Eosinophils Absolute: 0 10*3/uL (ref 0.0–0.5)
Eosinophils Relative: 1 %
HCT: 30 % — ABNORMAL LOW (ref 36.0–46.0)
Hemoglobin: 10.1 g/dL — ABNORMAL LOW (ref 12.0–15.0)
Immature Granulocytes: 1 %
Lymphocytes Relative: 19 %
Lymphs Abs: 1.3 10*3/uL (ref 0.7–4.0)
MCH: 32.4 pg (ref 26.0–34.0)
MCHC: 33.7 g/dL (ref 30.0–36.0)
MCV: 96.2 fL (ref 80.0–100.0)
Monocytes Absolute: 0.4 10*3/uL (ref 0.1–1.0)
Monocytes Relative: 6 %
Neutro Abs: 4.8 10*3/uL (ref 1.7–7.7)
Neutrophils Relative %: 73 %
Platelet Count: 196 10*3/uL (ref 150–400)
RBC: 3.12 MIL/uL — ABNORMAL LOW (ref 3.87–5.11)
RDW: 14.3 % (ref 11.5–15.5)
WBC Count: 6.5 10*3/uL (ref 4.0–10.5)
nRBC: 0 % (ref 0.0–0.2)
nRBC: 0 /100{WBCs}

## 2023-08-07 ENCOUNTER — Inpatient Hospital Stay: Payer: Medicare Other

## 2023-08-07 DIAGNOSIS — D631 Anemia in chronic kidney disease: Secondary | ICD-10-CM

## 2023-08-07 MED ORDER — EPOETIN ALFA-EPBX 20000 UNIT/ML IJ SOLN: 20000.0000 [IU] | Freq: Once | INTRAMUSCULAR | Status: DC

## 2023-08-07 NOTE — Progress Notes (Signed)
RETACRIT NOT GIVEN TODAY PER DR LEWIS, HGB 10.1

## 2023-08-09 ENCOUNTER — Other Ambulatory Visit: Payer: Self-pay

## 2023-08-09 MED ORDER — HYDRALAZINE HCL 25 MG PO TABS: 25.0000 mg | ORAL_TABLET | Freq: Three times a day (TID) | ORAL | 2 refills | Status: DC | PRN

## 2023-08-11 DIAGNOSIS — Z881 Allergy status to other antibiotic agents status: Secondary | ICD-10-CM | POA: Diagnosis not present

## 2023-08-11 DIAGNOSIS — S0003XA Contusion of scalp, initial encounter: Secondary | ICD-10-CM | POA: Diagnosis not present

## 2023-08-11 DIAGNOSIS — Z952 Presence of prosthetic heart valve: Secondary | ICD-10-CM | POA: Diagnosis not present

## 2023-08-11 DIAGNOSIS — R110 Nausea: Secondary | ICD-10-CM | POA: Diagnosis not present

## 2023-08-11 DIAGNOSIS — Z950 Presence of cardiac pacemaker: Secondary | ICD-10-CM | POA: Diagnosis not present

## 2023-08-11 DIAGNOSIS — I272 Pulmonary hypertension, unspecified: Secondary | ICD-10-CM | POA: Diagnosis not present

## 2023-08-11 DIAGNOSIS — W19XXXA Unspecified fall, initial encounter: Secondary | ICD-10-CM | POA: Diagnosis not present

## 2023-08-11 DIAGNOSIS — S5012XA Contusion of left forearm, initial encounter: Secondary | ICD-10-CM | POA: Diagnosis not present

## 2023-08-11 DIAGNOSIS — N179 Acute kidney failure, unspecified: Secondary | ICD-10-CM | POA: Diagnosis not present

## 2023-08-11 DIAGNOSIS — I351 Nonrheumatic aortic (valve) insufficiency: Secondary | ICD-10-CM | POA: Diagnosis not present

## 2023-08-11 DIAGNOSIS — D649 Anemia, unspecified: Secondary | ICD-10-CM | POA: Diagnosis not present

## 2023-08-11 DIAGNOSIS — I491 Atrial premature depolarization: Secondary | ICD-10-CM | POA: Diagnosis not present

## 2023-08-11 DIAGNOSIS — F0390 Unspecified dementia without behavioral disturbance: Secondary | ICD-10-CM | POA: Diagnosis not present

## 2023-08-11 DIAGNOSIS — I509 Heart failure, unspecified: Secondary | ICD-10-CM | POA: Diagnosis not present

## 2023-08-11 DIAGNOSIS — M1990 Unspecified osteoarthritis, unspecified site: Secondary | ICD-10-CM | POA: Diagnosis not present

## 2023-08-11 DIAGNOSIS — D631 Anemia in chronic kidney disease: Secondary | ICD-10-CM | POA: Diagnosis not present

## 2023-08-11 DIAGNOSIS — I959 Hypotension, unspecified: Secondary | ICD-10-CM | POA: Diagnosis not present

## 2023-08-11 DIAGNOSIS — I1310 Hypertensive heart and chronic kidney disease without heart failure, with stage 1 through stage 4 chronic kidney disease, or unspecified chronic kidney disease: Secondary | ICD-10-CM | POA: Diagnosis not present

## 2023-08-11 DIAGNOSIS — Z91040 Latex allergy status: Secondary | ICD-10-CM | POA: Diagnosis not present

## 2023-08-11 DIAGNOSIS — N1832 Chronic kidney disease, stage 3b: Secondary | ICD-10-CM | POA: Diagnosis not present

## 2023-08-11 DIAGNOSIS — D62 Acute posthemorrhagic anemia: Secondary | ICD-10-CM | POA: Diagnosis not present

## 2023-08-11 DIAGNOSIS — I10 Essential (primary) hypertension: Secondary | ICD-10-CM | POA: Diagnosis not present

## 2023-08-11 DIAGNOSIS — E7800 Pure hypercholesterolemia, unspecified: Secondary | ICD-10-CM | POA: Diagnosis not present

## 2023-08-11 DIAGNOSIS — Z7982 Long term (current) use of aspirin: Secondary | ICD-10-CM | POA: Diagnosis not present

## 2023-08-11 DIAGNOSIS — M109 Gout, unspecified: Secondary | ICD-10-CM | POA: Diagnosis not present

## 2023-08-11 DIAGNOSIS — R55 Syncope and collapse: Secondary | ICD-10-CM | POA: Diagnosis not present

## 2023-08-11 DIAGNOSIS — E860 Dehydration: Secondary | ICD-10-CM | POA: Diagnosis not present

## 2023-08-11 DIAGNOSIS — I130 Hypertensive heart and chronic kidney disease with heart failure and stage 1 through stage 4 chronic kidney disease, or unspecified chronic kidney disease: Secondary | ICD-10-CM | POA: Diagnosis not present

## 2023-08-11 DIAGNOSIS — R410 Disorientation, unspecified: Secondary | ICD-10-CM | POA: Diagnosis not present

## 2023-08-11 DIAGNOSIS — J9601 Acute respiratory failure with hypoxia: Secondary | ICD-10-CM | POA: Diagnosis not present

## 2023-08-11 DIAGNOSIS — Z79899 Other long term (current) drug therapy: Secondary | ICD-10-CM | POA: Diagnosis not present

## 2023-08-11 DIAGNOSIS — Z1152 Encounter for screening for COVID-19: Secondary | ICD-10-CM | POA: Diagnosis not present

## 2023-08-11 DIAGNOSIS — S8011XA Contusion of right lower leg, initial encounter: Secondary | ICD-10-CM | POA: Diagnosis not present

## 2023-08-12 ENCOUNTER — Telehealth: Payer: Self-pay

## 2023-08-12 DIAGNOSIS — Z952 Presence of prosthetic heart valve: Secondary | ICD-10-CM | POA: Diagnosis not present

## 2023-08-12 DIAGNOSIS — I1310 Hypertensive heart and chronic kidney disease without heart failure, with stage 1 through stage 4 chronic kidney disease, or unspecified chronic kidney disease: Secondary | ICD-10-CM | POA: Diagnosis not present

## 2023-08-12 DIAGNOSIS — D649 Anemia, unspecified: Secondary | ICD-10-CM

## 2023-08-12 DIAGNOSIS — I509 Heart failure, unspecified: Secondary | ICD-10-CM | POA: Diagnosis not present

## 2023-08-12 DIAGNOSIS — R55 Syncope and collapse: Secondary | ICD-10-CM | POA: Diagnosis not present

## 2023-08-12 NOTE — Telephone Encounter (Signed)
Kaitlin Villarreal returned our call and stated that he would see if [facility] had remote access set-up to check the patient's device, but if they did not he said that someone would be out today to evaluate the patient's device. He was provided with the patient's information and their room number in the hospital.

## 2023-08-12 NOTE — Telephone Encounter (Signed)
Received the following message from Dory Horn below with the reps information to contact him to evaluate the patient's device because the patient is in the hospital with falls and syncope per Dr. Dulce Sellar:  "Kaitlin Villarreal -- you can call Bonna Gains to help arrange this.... [phone] can you call him please....Marland Kitchen if not, let me know and I will try and reach him if needed"  Called Kaitlin Villarreal and left a message for him to call back. Informed Sherri that he had been called and she stated that was fine.

## 2023-08-13 DIAGNOSIS — R55 Syncope and collapse: Secondary | ICD-10-CM | POA: Diagnosis not present

## 2023-08-13 DIAGNOSIS — I1310 Hypertensive heart and chronic kidney disease without heart failure, with stage 1 through stage 4 chronic kidney disease, or unspecified chronic kidney disease: Secondary | ICD-10-CM | POA: Diagnosis not present

## 2023-08-13 DIAGNOSIS — Z952 Presence of prosthetic heart valve: Secondary | ICD-10-CM | POA: Diagnosis not present

## 2023-08-13 DIAGNOSIS — I351 Nonrheumatic aortic (valve) insufficiency: Secondary | ICD-10-CM | POA: Diagnosis not present

## 2023-08-20 ENCOUNTER — Other Ambulatory Visit: Payer: Self-pay

## 2023-08-20 ENCOUNTER — Inpatient Hospital Stay (HOSPITAL_COMMUNITY)
Admission: EM | Admit: 2023-08-20 | Discharge: 2023-08-26 | DRG: 378 | Disposition: A | Discharge status: Home / Self Care | Payer: Medicare Other | Attending: Internal Medicine | Admitting: Internal Medicine

## 2023-08-20 DIAGNOSIS — Z7982 Long term (current) use of aspirin: Secondary | ICD-10-CM

## 2023-08-20 DIAGNOSIS — Z79899 Other long term (current) drug therapy: Secondary | ICD-10-CM | POA: Diagnosis not present

## 2023-08-20 DIAGNOSIS — Z91041 Radiographic dye allergy status: Secondary | ICD-10-CM | POA: Diagnosis not present

## 2023-08-20 DIAGNOSIS — I130 Hypertensive heart and chronic kidney disease with heart failure and stage 1 through stage 4 chronic kidney disease, or unspecified chronic kidney disease: Secondary | ICD-10-CM | POA: Diagnosis not present

## 2023-08-20 DIAGNOSIS — I442 Atrioventricular block, complete: Secondary | ICD-10-CM | POA: Diagnosis not present

## 2023-08-20 DIAGNOSIS — R58 Hemorrhage, not elsewhere classified: Secondary | ICD-10-CM | POA: Diagnosis not present

## 2023-08-20 DIAGNOSIS — Z9049 Acquired absence of other specified parts of digestive tract: Secondary | ICD-10-CM

## 2023-08-20 DIAGNOSIS — Z90710 Acquired absence of both cervix and uterus: Secondary | ICD-10-CM

## 2023-08-20 DIAGNOSIS — Z8673 Personal history of transient ischemic attack (TIA), and cerebral infarction without residual deficits: Secondary | ICD-10-CM | POA: Diagnosis not present

## 2023-08-20 DIAGNOSIS — G3184 Mild cognitive impairment, so stated: Secondary | ICD-10-CM | POA: Diagnosis present

## 2023-08-20 DIAGNOSIS — N281 Cyst of kidney, acquired: Secondary | ICD-10-CM | POA: Diagnosis not present

## 2023-08-20 DIAGNOSIS — Z8249 Family history of ischemic heart disease and other diseases of the circulatory system: Secondary | ICD-10-CM

## 2023-08-20 DIAGNOSIS — I350 Nonrheumatic aortic (valve) stenosis: Secondary | ICD-10-CM | POA: Diagnosis not present

## 2023-08-20 DIAGNOSIS — Q210 Ventricular septal defect: Secondary | ICD-10-CM

## 2023-08-20 DIAGNOSIS — K219 Gastro-esophageal reflux disease without esophagitis: Secondary | ICD-10-CM | POA: Diagnosis present

## 2023-08-20 DIAGNOSIS — Z950 Presence of cardiac pacemaker: Secondary | ICD-10-CM

## 2023-08-20 DIAGNOSIS — K922 Gastrointestinal hemorrhage, unspecified: Principal | ICD-10-CM

## 2023-08-20 DIAGNOSIS — Z66 Do not resuscitate: Secondary | ICD-10-CM | POA: Diagnosis not present

## 2023-08-20 DIAGNOSIS — I5032 Chronic diastolic (congestive) heart failure: Secondary | ICD-10-CM | POA: Diagnosis present

## 2023-08-20 DIAGNOSIS — Z953 Presence of xenogenic heart valve: Secondary | ICD-10-CM

## 2023-08-20 DIAGNOSIS — I959 Hypotension, unspecified: Secondary | ICD-10-CM | POA: Diagnosis not present

## 2023-08-20 DIAGNOSIS — K921 Melena: Secondary | ICD-10-CM | POA: Diagnosis not present

## 2023-08-20 DIAGNOSIS — Z881 Allergy status to other antibiotic agents status: Secondary | ICD-10-CM

## 2023-08-20 DIAGNOSIS — I4891 Unspecified atrial fibrillation: Secondary | ICD-10-CM | POA: Diagnosis not present

## 2023-08-20 DIAGNOSIS — Z91040 Latex allergy status: Secondary | ICD-10-CM

## 2023-08-20 DIAGNOSIS — I129 Hypertensive chronic kidney disease with stage 1 through stage 4 chronic kidney disease, or unspecified chronic kidney disease: Secondary | ICD-10-CM | POA: Diagnosis present

## 2023-08-20 DIAGNOSIS — K862 Cyst of pancreas: Secondary | ICD-10-CM | POA: Diagnosis not present

## 2023-08-20 DIAGNOSIS — M109 Gout, unspecified: Secondary | ICD-10-CM | POA: Diagnosis present

## 2023-08-20 DIAGNOSIS — D62 Acute posthemorrhagic anemia: Secondary | ICD-10-CM

## 2023-08-20 DIAGNOSIS — K5731 Diverticulosis of large intestine without perforation or abscess with bleeding: Secondary | ICD-10-CM

## 2023-08-20 DIAGNOSIS — N1832 Chronic kidney disease, stage 3b: Secondary | ICD-10-CM | POA: Diagnosis not present

## 2023-08-20 DIAGNOSIS — E785 Hyperlipidemia, unspecified: Secondary | ICD-10-CM | POA: Diagnosis not present

## 2023-08-20 DIAGNOSIS — K5730 Diverticulosis of large intestine without perforation or abscess without bleeding: Secondary | ICD-10-CM | POA: Diagnosis not present

## 2023-08-20 DIAGNOSIS — I10 Essential (primary) hypertension: Secondary | ICD-10-CM | POA: Diagnosis not present

## 2023-08-20 DIAGNOSIS — N183 Chronic kidney disease, stage 3 unspecified: Secondary | ICD-10-CM | POA: Diagnosis present

## 2023-08-20 HISTORY — DX: Gastrointestinal hemorrhage, unspecified: K92.2

## 2023-08-20 LAB — COMPREHENSIVE METABOLIC PANEL
ALT: 18 U/L (ref 0–44)
AST: 34 U/L (ref 15–41)
Albumin: 3.7 g/dL (ref 3.5–5.0)
Alkaline Phosphatase: 53 U/L (ref 38–126)
Anion gap: 8 (ref 5–15)
BUN: 36 mg/dL — ABNORMAL HIGH (ref 8–23)
CO2: 24 mmol/L (ref 22–32)
Calcium: 9.2 mg/dL (ref 8.9–10.3)
Chloride: 111 mmol/L (ref 98–111)
Creatinine, Ser: 1.31 mg/dL — ABNORMAL HIGH (ref 0.44–1.00)
GFR, Estimated: 38 mL/min — ABNORMAL LOW (ref >60)
Glucose, Bld: 101 mg/dL — ABNORMAL HIGH (ref 70–99)
Potassium: 4.4 mmol/L (ref 3.5–5.1)
Sodium: 143 mmol/L (ref 135–145)
Total Bilirubin: 0.7 mg/dL (ref <1.2)
Total Protein: 7.1 g/dL (ref 6.5–8.1)

## 2023-08-20 LAB — CBC WITH DIFFERENTIAL/PLATELET
Abs Immature Granulocytes: 0.05 10*3/uL (ref 0.00–0.07)
Basophils Absolute: 0 10*3/uL (ref 0.0–0.1)
Basophils Relative: 1 %
Eosinophils Absolute: 0.1 10*3/uL (ref 0.0–0.5)
Eosinophils Relative: 2 %
HCT: 29 % — ABNORMAL LOW (ref 36.0–46.0)
Hemoglobin: 9.1 g/dL — ABNORMAL LOW (ref 12.0–15.0)
Immature Granulocytes: 1 %
Lymphocytes Relative: 23 %
Lymphs Abs: 1.4 10*3/uL (ref 0.7–4.0)
MCH: 31.8 pg (ref 26.0–34.0)
MCHC: 31.4 g/dL (ref 30.0–36.0)
MCV: 101.4 fL — ABNORMAL HIGH (ref 80.0–100.0)
Monocytes Absolute: 0.5 10*3/uL (ref 0.1–1.0)
Monocytes Relative: 8 %
Neutro Abs: 4 10*3/uL (ref 1.7–7.7)
Neutrophils Relative %: 65 %
Platelets: 247 10*3/uL (ref 150–400)
RBC: 2.86 MIL/uL — ABNORMAL LOW (ref 3.87–5.11)
RDW: 14.3 % (ref 11.5–15.5)
WBC: 6.1 10*3/uL (ref 4.0–10.5)
nRBC: 0 % (ref 0.0–0.2)

## 2023-08-20 LAB — POC OCCULT BLOOD, ED: Fecal Occult Bld: POSITIVE — AB

## 2023-08-20 LAB — PROTIME-INR
INR: 0.9 (ref 0.8–1.2)
Prothrombin Time: 12.6 s (ref 11.4–15.2)

## 2023-08-20 LAB — APTT: aPTT: 46 s — ABNORMAL HIGH (ref 24–36)

## 2023-08-20 MED ORDER — PANTOPRAZOLE SODIUM 40 MG IV SOLR
40.0000 mg | Freq: Two times a day (BID) | INTRAVENOUS | Status: DC
  Administered 2023-08-21 – 2023-08-22 (×5): 40 mg via INTRAVENOUS
  Filled 2023-08-20 (×6): qty 10

## 2023-08-20 MED ORDER — LACTATED RINGERS IV SOLN: INTRAVENOUS | Status: AC

## 2023-08-20 MED ORDER — ONDANSETRON HCL 4 MG/2ML IJ SOLN: 4.0000 mg | Freq: Four times a day (QID) | INTRAMUSCULAR | Status: DC | PRN

## 2023-08-20 MED ORDER — ACETAMINOPHEN 650 MG RE SUPP: 650.0000 mg | Freq: Four times a day (QID) | RECTAL | Status: DC | PRN

## 2023-08-20 MED ORDER — ACETAMINOPHEN 325 MG PO TABS
650.0000 mg | ORAL_TABLET | Freq: Four times a day (QID) | ORAL | Status: DC | PRN
  Administered 2023-08-21 – 2023-08-26 (×2): 650 mg via ORAL
  Filled 2023-08-20 (×2): qty 2

## 2023-08-20 MED ORDER — ONDANSETRON HCL 4 MG PO TABS: 4.0000 mg | ORAL_TABLET | Freq: Four times a day (QID) | ORAL | Status: DC | PRN

## 2023-08-20 NOTE — H&P (Incomplete)
PCP:   Annamaria Helling, DO   Chief Complaint:  Bright red blood per rectum  HPI: This 89 year old female with past medical history of CKD stage III, HFpEF, HLD, HTN, history of severe aortic stenosis sp TAVR, history of CHB sp PPM.  Tonight patient with to the restroom, BM.  The commode filled with bright red blood.  She denies abdominal pain, history of GI bleed.  She takes a baby aspirin daily.  Remote history of colonoscopy.  In the ER patient hemodynamically stable.  Hemoglobin 9.1, previous 10.1 [11//24].  Platelets normal.  INR 0.9.  Occult stool positive.  EDP spoke with Dr. Leone Payor with gastroenterology.  Review of Systems:  Per HPI  Past Medical History: Past Medical History:  Diagnosis Date   Acute on chronic diastolic heart failure (HCC) 03/18/2018   Aortic stenosis 10/14/2014   Formatting of this note might be different from the original.  moderate by echo 11/2015   Arthritis    Bilateral renal masses 07/26/2017   Bone spur of toe of left foot    Chronic diastolic (congestive) heart failure (HCC)    Chronic diastolic heart failure (HCC) 03/18/2018   CKD (chronic kidney disease) stage 3, GFR 30-59 ml/min (HCC) 07/26/2017   Essential hypertension    GERD (gastroesophageal reflux disease)    HLD (hyperlipidemia)    Hyperlipidemia 11/25/2016   Pacemaker 09/02/2018   Pancreatic lesion 04/17/2018   Pancreatic mass    a. 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 03/2018: s/p TAVR by Excell Seltzer and Dr. Cornelius Moras   Stage 3 chronic kidney disease (HCC)    TIA (transient ischemic attack)     a. 1992   VSD (ventricular septal defect)    Past Surgical History:  Procedure Laterality Date   ABDOMINAL HYSTERECTOMY     APPENDECTOMY     HERNIA  REPAIR     INTRAOPERATIVE TRANSTHORACIC ECHOCARDIOGRAM N/A 03/18/2018   Procedure: INTRAOPERATIVE TRANSTHORACIC ECHOCARDIOGRAM;  Surgeon: Tonny Bollman, MD;  Location: [facility] OR;  Service: Open Heart Surgery;  Laterality: N/A;   PACEMAKER IMPLANT N/A 03/20/2018   Procedure: PACEMAKER IMPLANT;  Surgeon: Regan Lemming, MD;  Location: [facility] INVASIVE CV LAB;  Service: Cardiovascular;  Laterality: N/A;   RIGHT HEART CATH N/A 03/20/2018   Procedure: RIGHT HEART CATH;  Surgeon: Tonny Bollman, MD;  Location: [facility] INVASIVE CV LAB;  Service: Cardiovascular;  Laterality: N/A;   RIGHT/LEFT HEART CATH AND CORONARY ANGIOGRAPHY N/A 02/06/2018   Procedure: RIGHT/LEFT HEART CATH AND CORONARY ANGIOGRAPHY;  Surgeon: Tonny Bollman, MD;  Location: [facility] INVASIVE CV LAB;  Service: Cardiovascular;  Laterality: N/A;   TONSILLECTOMY     TRANSCATHETER AORTIC VALVE REPLACEMENT, TRANSFEMORAL N/A 03/18/2018   Procedure: TRANSCATHETER AORTIC VALVE REPLACEMENT, TRANSFEMORAL;  Surgeon: Tonny Bollman, MD;  Location: [facility] OR;  Service: Open Heart Surgery;  Laterality: N/A;   WISDOM TOOTH EXTRACTION      Medications: Prior to Admission medications   Medication Sig Start Date End Date Taking? Authorizing Provider  acetaminophen (TYLENOL) 650 MG CR tablet Take 650-1,300 mg by mouth every 8 (eight) hours as needed for pain.   Yes [provider]  allopurinol (ZYLOPRIM) 100 MG tablet Take 100 mg by mouth daily.   Yes [provider]  amLODipine (NORVASC) 5 MG tablet Take 5 mg by mouth daily.   Yes [provider]  aspirin EC 81 MG tablet Take 81 mg by mouth daily.    Yes [provider]  Carboxymethylcellulose Sod PF (THERATEARS PF) 0.25 % SOLN Place 1 drop into both eyes in the morning and at bedtime.   Yes [provider]  Cyanocobalamin (B-12) 2500 MCG TABS Take 2,500 mcg by mouth daily.    Yes [provider]  estradiol (ESTRACE) 1 MG tablet Take 0.5 mg by mouth daily.   Yes  [provider]  fluticasone (FLONASE) 50 MCG/ACT nasal spray Place 2 sprays into both nostrils daily as needed for allergies or rhinitis. 11/24/19  Yes [provider]  furosemide (LASIX) 20 MG tablet TAKE 1 TABLET BY MOUTH DAILY Patient taking differently: Take 20 mg by mouth See admin instructions. Take 20mg  by mouth on Monday, Wednesday, and Friday. 06/28/23  Yes Baldo Daub, MD  hydrALAZINE (APRESOLINE) 25 MG tablet Take 1 tablet (25 mg total) by mouth 3 (three) times daily as needed (when BP is over 180). Patient taking differently: Take 25 mg by mouth 3 (three) times daily. 08/09/23  Yes Baldo Daub, MD  lisinopril (ZESTRIL) 40 MG tablet Take 40 mg by mouth daily. 12/01/22  Yes [provider]  metoprolol tartrate (LOPRESSOR) 50 MG tablet Take 1 tablet by mouth twice daily 04/13/22  Yes Munley, Iline Oven, MD  Multiple Vitamins-Minerals (MULTI-VITAMIN GUMMIES) CHEW Chew 2 tablets by mouth daily.    Yes [provider]  Omega-3 Fatty Acids (FISH OIL) 500 MG CAPS Take 3 capsules by mouth daily.   Yes [provider]  Vitamin D, Ergocalciferol, (DRISDOL) 1.25 MG (50000 UT) CAPS capsule Take 50,000 Units by mouth See admin instructions. Take 50,000 units by mouth 2 times a week- Mondays and Fridays 08/25/18  Yes [provider]    Allergies:   Allergies  Allergen Reactions   Clarithromycin Other (See Comments)    Felt like body was swollen, felt awful"- and, angioedema  Other Reaction(s): Angioedema*  ask, Felt like body was swollen, felt awful"- and, angioedema   Latex Rash and Other (See Comments)    Causes sores and Rash-Generalized   Levofloxacin Other (See Comments)    Hallucinations    Azithromycin Other (See Comments)    Reaction??   Diphenhydramine Hcl     Other reaction(s): Unknown   Ivp Dye [Iodinated Contrast Media]  Social History:  reports that she has never smoked. She has never been exposed to tobacco smoke. She has never used smokeless tobacco. She reports that she does not drink alcohol and does not use drugs.  Family History: Family History  Problem Relation Age of Onset   Heart disease Mother    Uterine cancer Sister    Congenital heart disease Brother    Heart attack Brother     Physical Exam: Vitals:   08/20/23 2045 08/20/23 2053 08/20/23 2100 08/20/23 2115  BP: (!) 173/54  (!) 177/47 (!) 185/46  Pulse:  79 75 74  Resp:  (!) 25 (!) 26 (!) 26  Temp:    98.9 F (37.2 C)  TempSrc:    Oral  SpO2:  96% 97% 95%    General: A and O x 3, well developed and nourished, no acute distress Eyes: Pink conjunctiva, no scleral icterus ENT: Moist oral mucosa, neck supple, no thyromegaly Lungs: CTA B/L, no wheeze, no crackles, no use of accessory muscles Cardiovascular: RRR, no murmurs. No carotid bruits, no JVD Abdomen: soft, positive BS, NTND not an acute abdomen GU: not examined Neuro: CN II - XII grossly intact, sensation intact Musculoskeletal: strength 5/5 all extremities, no  edema Skin: no rash, no subcutaneous crepitation, no decubitus Psych: appropriate patient  Labs on Admission:  Recent Labs    08/20/23 2059  NA 143  K 4.4  CL 111  CO2 24  GLUCOSE 101*  BUN 36*  CREATININE 1.31*  CALCIUM 9.2   Recent Labs    08/20/23 2059  AST 34  ALT 18  ALKPHOS 53  BILITOT 0.7  PROT 7.1  ALBUMIN 3.7    Recent Labs    08/20/23 2059  WBC 6.1  NEUTROABS 4.0  HGB 9.1*   HCT 29.0*  MCV 101.4*  PLT 247    Radiological Exams on Admission: No results found.  Assessment/Plan Present on Admission:  GI bleed //  BRBPR -N.p.o., gentle IV fluid hydration -Serial H&H every 6 hours -IV Protonix every 12 hours -Will transfuse as needed -Benign abdomen, diverticular bleeding high possibility -Unable to contrast study with CKD stage IIIb and dye allergy. -GI Dr. Leone Payor consulted.  Formal consult in a.m. -ASA on hold   Chronic diastolic (congestive) heart failure (HCC) -Norvasc, lisinopril resumed.  Lasix on hold.  ASA on hold.   Essential hypertension -Norvasc, lisinopril ordered -Hydralazine, metoprolol on hold   HLD (hyperlipidemia) -Omega 3 fatty acid on hold   Gout -Allopurinol resumed   Stage 3 chronic kidney disease due to benign hypertension (HCC) -Stable at baseline  Addendum Overnight hemoglobin decreased to 5.9.   2 units PRBC is ordered.    Georgios Kina 08/20/2023, 10:15 PM

## 2023-08-20 NOTE — ED Triage Notes (Signed)
Pt BIB EMS from home. C/o rectal bleeding when pt when to bathroom to have bm. A&Ox4. Bright red blood noted   Hx of dementia, hypertension, Afib, pacemaker  EMS VS 180/60, 96% RA, HR 96, cbg 136 Daughter notified per ems

## 2023-08-20 NOTE — ED Notes (Signed)
Daughter Lovett Sox [phone] would like an update asap

## 2023-08-20 NOTE — ED Notes (Signed)
ED TO INPATIENT HANDOFF REPORT  ED Nurse Name and Phone #: Brittnee Gaetano [phone]  S Name/Age/Gender Kaitlin Villarreal 89 y.o. female Room/Bed: 029C/029C  Code Status   Code Status: Prior  Home/SNF/Other Home Patient oriented to: self, place, time, and situation Is this baseline? Yes   Triage Complete: Triage complete  Chief Complaint GI bleed [K92.2]  Triage Note No notes on file   Allergies Allergies  Allergen Reactions   Clarithromycin Other (See Comments)    Felt like body was swollen, felt awful"- and, angioedema  Other Reaction(s): Angioedema*  ask, Felt like body was swollen, felt awful"- and, angioedema   Latex Rash and Other (See Comments)    Causes sores and Rash-Generalized   Levofloxacin Other (See Comments)    Hallucinations    Azithromycin Other (See Comments)    Reaction??   Diphenhydramine Hcl     Other reaction(s): Unknown   Ivp Dye [Iodinated Contrast Media]     Level of Care/Admitting Diagnosis ED Disposition     ED Disposition  Admit   Condition  --   Comment  Hospital Area: [facility] [[id number]]  Level of Care: Med-Surg [16]  May admit patient to [facility] or [facility] if equivalent level of care is available:: No  Covid Evaluation: Asymptomatic - no recent exposure (last 10 days) testing not required  Diagnosis: GI bleed [981191]  Admitting Physician: Gery Pray [[id number]]  Attending Physician: Gery Pray [[id number]]  Certification:: I certify this patient will need inpatient services for at least 2 midnights  Expected Medical Readiness: 08/22/2023          B Medical/Surgery History Past Medical History:  Diagnosis Date   Acute on chronic diastolic heart failure (HCC) 03/18/2018   Aortic stenosis 10/14/2014   Formatting of this note might be different from the original.  moderate by echo 11/2015   Arthritis    Bilateral renal masses 07/26/2017   Bone spur of toe of left foot    Chronic diastolic (congestive) heart  failure (HCC)    Chronic diastolic heart failure (HCC) 03/18/2018   CKD (chronic kidney disease) stage 3, GFR 30-59 ml/min (HCC) 07/26/2017   Essential hypertension    GERD (gastroesophageal reflux disease)    HLD (hyperlipidemia)    Hyperlipidemia 11/25/2016   Pacemaker 09/02/2018   Pancreatic lesion 04/17/2018   Pancreatic mass    a. 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 03/2018: s/p TAVR by Excell Seltzer and Dr. Cornelius Moras   Stage 3 chronic kidney disease (HCC)    TIA (transient ischemic attack)     a. 1992   VSD (ventricular septal defect)    Past Surgical History:  Procedure Laterality Date   ABDOMINAL HYSTERECTOMY     APPENDECTOMY     HERNIA REPAIR     INTRAOPERATIVE TRANSTHORACIC ECHOCARDIOGRAM N/A 03/18/2018   Procedure: INTRAOPERATIVE TRANSTHORACIC ECHOCARDIOGRAM;  Surgeon: Tonny Bollman, MD;  Location: [facility] OR;  Service: Open Heart Surgery;  Laterality: N/A;   PACEMAKER IMPLANT N/A 03/20/2018   Procedure: PACEMAKER IMPLANT;  Surgeon: Regan Lemming, MD;  Location: [facility] INVASIVE CV LAB;  Service: Cardiovascular;  Laterality: N/A;   RIGHT HEART CATH N/A 03/20/2018   Procedure: RIGHT HEART CATH;  Surgeon:  Tonny Bollman, MD;  Location: [facility] INVASIVE CV LAB;  Service: Cardiovascular;  Laterality: N/A;   RIGHT/LEFT HEART CATH AND CORONARY ANGIOGRAPHY N/A 02/06/2018   Procedure: RIGHT/LEFT HEART CATH AND CORONARY ANGIOGRAPHY;  Surgeon: Tonny Bollman, MD;  Location: [facility] INVASIVE CV LAB;  Service: Cardiovascular;  Laterality: N/A;   TONSILLECTOMY     TRANSCATHETER AORTIC VALVE REPLACEMENT, TRANSFEMORAL N/A 03/18/2018   Procedure: TRANSCATHETER AORTIC VALVE REPLACEMENT, TRANSFEMORAL;  Surgeon: Tonny Bollman, MD;  Location: [facility] OR;  Service: Open  Heart Surgery;  Laterality: N/A;   WISDOM TOOTH EXTRACTION       A IV Location/Drains/Wounds Patient Lines/Drains/Airways Status     Active Line/Drains/Airways     Name Placement date Placement time Site Days   Peripheral IV 08/20/23 20 G Anterior;Right Forearm 08/20/23  2123  Forearm  less than 1   Peripheral IV 08/20/23 20 G 1" Anterior;Left Forearm 08/20/23  2154  Forearm  less than 1   Incision (Closed) 03/18/18 Groin Left 03/18/18  0938  -- 1981   Incision (Closed) 03/18/18 Groin Right 03/18/18  0938  -- 1981            Intake/Output Last 24 hours No intake or output data in the 24 hours ending 08/20/23 2300  Labs/Imaging Results for orders placed or performed during the hospital encounter of 08/20/23 (from the past 48 hour(s))  POC occult blood, ED     Status: Abnormal   Collection Time: 08/20/23  8:52 PM  Result Value Ref Range   Fecal Occult Bld POSITIVE (A) NEGATIVE  Comprehensive metabolic panel     Status: Abnormal   Collection Time: 08/20/23  8:59 PM  Result Value Ref Range   Sodium 143 135 - 145 mmol/L   Potassium 4.4 3.5 - 5.1 mmol/L   Chloride 111 98 - 111 mmol/L   CO2 24 22 - 32 mmol/L   Glucose, Bld 101 (H) 70 - 99 mg/dL    Comment: Glucose reference range applies only to samples taken after fasting for at least 8 hours.   BUN 36 (H) 8 - 23 mg/dL   Creatinine, Ser 7.82 (H) 0.44 - 1.00 mg/dL   Calcium 9.2 8.9 - 95.6 mg/dL   Total Protein 7.1 6.5 - 8.1 g/dL   Albumin 3.7 3.5 - 5.0 g/dL   AST 34 15 - 41 U/L   ALT 18 0 - 44 U/L   Alkaline Phosphatase 53 38 - 126 U/L   Total Bilirubin 0.7 <1.2 mg/dL   GFR, Estimated 38 (L) >60 mL/min    Comment: (NOTE) Calculated using the CKD-EPI Creatinine Equation (2021)    Anion gap 8 5 - 15    Comment: Performed at [facility], [address] [place], [place] [address]  CBC with Differential     Status: Abnormal   Collection Time: 08/20/23  8:59 PM  Result Value Ref Range   WBC 6.1 4.0 - 10.5 K/uL    RBC 2.86 (L) 3.87 - 5.11 MIL/uL   Hemoglobin 9.1 (L) 12.0 - 15.0 g/dL   HCT 65.7 (L) 84.6 - 96.2 %   MCV 101.4 (H) 80.0 - 100.0 fL   MCH 31.8 26.0 - 34.0 pg   MCHC 31.4 30.0 - 36.0 g/dL   RDW 95.2 84.1 - 32.4 %   Platelets 247 150 - 400 K/uL   nRBC 0.0 0.0 - 0.2 %   Neutrophils Relative % 65 %   Neutro Abs 4.0 1.7 - 7.7 K/uL   Lymphocytes Relative  23 %   Lymphs Abs 1.4 0.7 - 4.0 K/uL   Monocytes Relative 8 %   Monocytes Absolute 0.5 0.1 - 1.0 K/uL   Eosinophils Relative 2 %   Eosinophils Absolute 0.1 0.0 - 0.5 K/uL   Basophils Relative 1 %   Basophils Absolute 0.0 0.0 - 0.1 K/uL   Immature Granulocytes 1 %   Abs Immature Granulocytes 0.05 0.00 - 0.07 K/uL    Comment: Performed at [facility], [address], [place], [place] [address]  Type and screen [facility]     Status: None   Collection Time: 08/20/23  8:59 PM  Result Value Ref Range   ABO/RH(D) O POS    Antibody Screen NEG    Sample Expiration      08/23/2023,2359 Performed at [facility], [name], [place], [place] [address]   Protime-INR     Status: None   Collection Time: 08/20/23  9:15 PM  Result Value Ref Range   Prothrombin Time 12.6 11.4 - 15.2 seconds   INR 0.9 0.8 - 1.2    Comment: (NOTE) INR goal varies based on device and disease states. Performed at [facility], [address] [place], [place] [address]   APTT     Status: Abnormal   Collection Time: 08/20/23  9:15 PM  Result Value Ref Range   aPTT 46 (H) 24 - 36 seconds    Comment:        IF BASELINE aPTT IS ELEVATED, SUGGEST PATIENT RISK ASSESSMENT BE USED TO DETERMINE APPROPRIATE ANTICOAGULANT THERAPY. Performed at [facility], [address] [place], [place] [address]    No results found.  Pending Labs Unresulted Labs (From admission, onward)    None       Vitals/Pain Today's Vitals   08/20/23 2100 08/20/23 2115 08/20/23 2200 08/20/23 2245  BP: (!) 177/47 (!) 185/46 (!) 169/34  (!) 174/35  Pulse: 75 74 74 (!) 46  Resp: (!) 26 (!) 26 15 (!) 22  Temp:  98.9 F (37.2 C)    TempSrc:  Oral    SpO2: 97% 95% 98% 98%    Isolation Precautions No active isolations  Medications Medications - No data to display  Mobility Has not ambulated since arrival to ED.          R Recommendations: See Admitting Provider Note  Report given to:   Additional Notes:  Pt is A&Ox4, hx of dementia.

## 2023-08-20 NOTE — ED Provider Notes (Signed)
[place] EMERGENCY DEPARTMENT AT [facility] Provider Note   CSN: [id number] Arrival date & time: 08/20/23  2037     History  Chief Complaint  Patient presents with   GI Bleeding    Kaitlin Villarreal is a 89 y.o. female.  Is a 89 year old female presents emergency department for rectal bleeding.  Reports having bright red bowel movement roughly an hour prior to arrival.  No history of GI bleeds.  Not taking anticoagulants.  Reports colonoscopy 10 to 15 years ago.  Does not recall results.  States she generally feels her normal self.  No abdominal pain.        Home Medications Prior to Admission medications   Medication Sig Start Date End Date Taking? Authorizing Provider  acetaminophen (TYLENOL) 650 MG CR tablet Take 650-1,300 mg by mouth every 8 (eight) hours as needed for pain.   Yes [provider]  allopurinol (ZYLOPRIM) 100 MG tablet Take 100 mg by mouth daily.   Yes [provider]  amLODipine (NORVASC) 5 MG tablet Take 5 mg by mouth daily.   Yes [provider]  aspirin EC 81 MG tablet Take 81 mg by mouth daily.    Yes [provider]  Carboxymethylcellulose Sod PF (THERATEARS PF) 0.25 % SOLN Place 1 drop into both eyes in the morning and at bedtime.   Yes [provider]  Cyanocobalamin (B-12) 2500 MCG TABS Take 2,500 mcg by mouth daily.    Yes [provider]  estradiol (ESTRACE) 1 MG tablet Take 0.5 mg by mouth daily.   Yes [provider]  fluticasone (FLONASE) 50 MCG/ACT nasal spray Place 2 sprays into both nostrils daily as needed for allergies or rhinitis. 11/24/19  Yes [provider]  furosemide (LASIX) 20 MG tablet TAKE 1 TABLET BY MOUTH DAILY Patient taking differently: Take 20 mg by mouth See admin instructions. Take 20mg  by mouth on Monday, Wednesday, and Friday. 06/28/23  Yes Baldo Daub, MD  hydrALAZINE (APRESOLINE) 25 MG tablet Take 1 tablet (25 mg total) by mouth 3 (three)  times daily as needed (when BP is over 180). Patient taking differently: Take 25 mg by mouth 3 (three) times daily. 08/09/23  Yes Baldo Daub, MD  lisinopril (ZESTRIL) 40 MG tablet Take 40 mg by mouth daily. 12/01/22  Yes [provider]  metoprolol tartrate (LOPRESSOR) 50 MG tablet Take 1 tablet by mouth twice daily 04/13/22  Yes Munley, Iline Oven, MD  Multiple Vitamins-Minerals (MULTI-VITAMIN GUMMIES) CHEW Chew 2 tablets by mouth daily.    Yes [provider]  Omega-3 Fatty Acids (FISH OIL) 500 MG CAPS Take 3 capsules by mouth daily.   Yes [provider]  Vitamin D, Ergocalciferol, (DRISDOL) 1.25 MG (50000 UT) CAPS capsule Take 50,000 Units by mouth See admin instructions. Take 50,000 units by mouth 2 times a week- Mondays and Fridays 08/25/18  Yes [provider]      Allergies    Clarithromycin, Latex, Levofloxacin, Azithromycin, Diphenhydramine hcl, and Ivp dye [iodinated contrast media]    Review of Systems   Review of Systems  Physical Exam Updated Vital Signs BP (!) 174/35   Pulse (!) 46   Temp 98.9 F (37.2 C) (Oral)   Resp (!) 22   SpO2 98%  Physical Exam Vitals and nursing note reviewed. Exam conducted with a chaperone present.  Constitutional:      General: She is not in acute distress.    Appearance: She is not  toxic-appearing.  HENT:     Head: Normocephalic.  Eyes:     Conjunctiva/sclera: Conjunctivae normal.  Cardiovascular:     Rate and Rhythm: Normal rate and regular rhythm.  Pulmonary:     Effort: Pulmonary effort is normal.     Breath sounds: Normal breath sounds.  Abdominal:     General: Abdomen is flat. There is no distension.     Tenderness: There is no abdominal tenderness. There is no guarding or rebound.  Genitourinary:    Comments: Blood clots noted in undergarments.  Grossly positive Hemoccult.  No masses appreciated on digital rectal exam. Skin:    General: Skin is warm.     Capillary Refill: Capillary refill  takes less than 2 seconds.  Neurological:     Mental Status: She is alert and oriented to person, place, and time.  Psychiatric:        Mood and Affect: Mood normal.        Behavior: Behavior normal.     ED Results / Procedures / Treatments   Labs (all labs ordered are listed, but only abnormal results are displayed) Labs Reviewed  COMPREHENSIVE METABOLIC PANEL - Abnormal; Notable for the following components:      Result Value   Glucose, Bld 101 (*)    BUN 36 (*)    Creatinine, Ser 1.31 (*)    GFR, Estimated 38 (*)    All other components within normal limits  CBC WITH DIFFERENTIAL/PLATELET - Abnormal; Notable for the following components:   RBC 2.86 (*)    Hemoglobin 9.1 (*)    HCT 29.0 (*)    MCV 101.4 (*)    All other components within normal limits  APTT - Abnormal; Notable for the following components:   aPTT 46 (*)    All other components within normal limits  POC OCCULT BLOOD, ED - Abnormal; Notable for the following components:   Fecal Occult Bld POSITIVE (*)    All other components within normal limits  PROTIME-INR  TYPE AND SCREEN    EKG None  Radiology No results found.  Procedures Procedures    Medications Ordered in ED Medications - No data to display  ED Course/ Medical Decision Making/ A&P Clinical Course as of 08/20/23 2325  Tue Aug 20, 2023  2112 Patient stated that she is a DNR.  Called and spoke with daughter on the phone who also confirmed that patient is a DNR.  Does not have a gastroenterologist in town.  Daughter does not know what contrast allergy mother has. [TY]  2115 Patient also does not know if she has ever had contrast and does not recall a specific allergy.  [TY]  2121 Hemoglobin(!): 9.1 Was ten 2 weeks ago.  [TY]  2123 Spoke with Dr. Leone Payor with GI. Suspects Diverticular bleed. Since patient with unknown contrast allergy to IV contrast would admit for serial H/H and transfuse as indicated. Admit to medicine with GI consult.   [TY]    Clinical Course User Index [TY] Coral Spikes, DO                                 Medical Decision Making This is a 89 year old female with presented to the emergency department for rectal bleeding.  Per chart review has a history of CHF, aortic stenosis CKD hyperlipidemia pancreatic mass chronic anemia.  Further chart review did not reveal prior colonoscopy records.  She is nontachycardic  hemodynamically stable.  Did have significant hematochezia on exam.  Hemoglobin seemingly similar to prior.  No significant metabolic derangements.  No coagulopathy.  Case discussed with GI; see ED course.  Admitting patient for acute GI bleed suspected from diverticular source.  Amount and/or Complexity of Data Reviewed Independent Historian:     Details: Case discussed with daughter.  See ED course External Data Reviewed:     Details: See above Labs: ordered. Decision-making details documented in ED Course. Radiology:     Details: Considered CTA, however has IV contrast allergy.  Risk Decision regarding hospitalization. Decision not to resuscitate or to de-escalate care because of poor prognosis. Diagnosis or treatment significantly limited by social determinants of health. Risk Details: Advanced age          Final Clinical Impression(s) / ED Diagnoses Final diagnoses:  Acute GI bleeding    Rx / DC Orders ED Discharge Orders     None         Coral Spikes, DO 08/20/23 2325

## 2023-08-21 ENCOUNTER — Encounter (HOSPITAL_COMMUNITY): Payer: Self-pay | Admitting: Family Medicine

## 2023-08-21 DIAGNOSIS — K921 Melena: Secondary | ICD-10-CM | POA: Diagnosis not present

## 2023-08-21 DIAGNOSIS — D62 Acute posthemorrhagic anemia: Secondary | ICD-10-CM

## 2023-08-21 DIAGNOSIS — K922 Gastrointestinal hemorrhage, unspecified: Secondary | ICD-10-CM | POA: Diagnosis not present

## 2023-08-21 HISTORY — DX: Melena: K92.1

## 2023-08-21 HISTORY — DX: Acute posthemorrhagic anemia: D62

## 2023-08-21 LAB — CBC
HCT: 18.5 % — ABNORMAL LOW (ref 36.0–46.0)
Hemoglobin: 5.9 g/dL — CL (ref 12.0–15.0)
MCH: 31.9 pg (ref 26.0–34.0)
MCHC: 31.9 g/dL (ref 30.0–36.0)
MCV: 100 fL (ref 80.0–100.0)
Platelets: 190 10*3/uL (ref 150–400)
RBC: 1.85 MIL/uL — ABNORMAL LOW (ref 3.87–5.11)
RDW: 14.3 % (ref 11.5–15.5)
WBC: 5.6 10*3/uL (ref 4.0–10.5)
nRBC: 0 % (ref 0.0–0.2)

## 2023-08-21 LAB — HEMOGLOBIN AND HEMATOCRIT, BLOOD
HCT: 26.6 % — ABNORMAL LOW (ref 36.0–46.0)
HCT: 18.5 % — ABNORMAL LOW (ref 36.0–46.0)
Hemoglobin: 8.9 g/dL — ABNORMAL LOW (ref 12.0–15.0)
Hemoglobin: 6.5 g/dL — CL (ref 12.0–15.0)

## 2023-08-21 LAB — BASIC METABOLIC PANEL
Anion gap: 7 (ref 5–15)
BUN: 36 mg/dL — ABNORMAL HIGH (ref 8–23)
CO2: 22 mmol/L (ref 22–32)
Calcium: 8.2 mg/dL — ABNORMAL LOW (ref 8.9–10.3)
Chloride: 115 mmol/L — ABNORMAL HIGH (ref 98–111)
Creatinine, Ser: 1.44 mg/dL — ABNORMAL HIGH (ref 0.44–1.00)
GFR, Estimated: 33 mL/min — ABNORMAL LOW (ref >60)
Glucose, Bld: 94 mg/dL (ref 70–99)
Potassium: 4.6 mmol/L (ref 3.5–5.1)
Sodium: 144 mmol/L (ref 135–145)

## 2023-08-21 LAB — FOLATE: Folate: >40 ng/mL (ref >5.9)

## 2023-08-21 LAB — PREPARE RBC (CROSSMATCH)

## 2023-08-21 LAB — VITAMIN B12: Vitamin B-12: 5940 pg/mL — ABNORMAL HIGH (ref 180–914)

## 2023-08-21 MED ORDER — DIPHENHYDRAMINE HCL 25 MG PO CAPS
50.0000 mg | ORAL_CAPSULE | Freq: Once | ORAL | Status: AC
  Administered 2023-08-22: 50 mg via ORAL
  Filled 2023-08-21: qty 2

## 2023-08-21 MED ORDER — LISINOPRIL 10 MG PO TABS
10.0000 mg | ORAL_TABLET | Freq: Every day | ORAL | Status: DC
  Administered 2023-08-21: 10 mg via ORAL
  Filled 2023-08-21 (×2): qty 1

## 2023-08-21 MED ORDER — PREDNISONE 20 MG PO TABS
50.0000 mg | ORAL_TABLET | Freq: Four times a day (QID) | ORAL | Status: AC
  Administered 2023-08-21 – 2023-08-22 (×3): 50 mg via ORAL
  Filled 2023-08-21 (×3): qty 1

## 2023-08-21 MED ORDER — SODIUM CHLORIDE 0.9% IV SOLUTION: Freq: Once | INTRAVENOUS | Status: AC

## 2023-08-21 MED ORDER — METHYLPREDNISOLONE SODIUM SUCC 40 MG IJ SOLR: 40.0000 mg | INTRAMUSCULAR | Status: DC

## 2023-08-21 MED ORDER — AMLODIPINE BESYLATE 5 MG PO TABS
5.0000 mg | ORAL_TABLET | Freq: Every day | ORAL | Status: DC
  Administered 2023-08-21 – 2023-08-25 (×4): 5 mg via ORAL
  Filled 2023-08-21 (×6): qty 1

## 2023-08-21 MED ORDER — SODIUM CHLORIDE 0.9% IV SOLUTION: Freq: Once | INTRAVENOUS | Status: DC

## 2023-08-21 MED ORDER — DIPHENHYDRAMINE HCL 50 MG/ML IJ SOLN: 50.0000 mg | INTRAMUSCULAR | Status: DC

## 2023-08-21 MED ORDER — HYDRALAZINE HCL 10 MG PO TABS: 10.0000 mg | ORAL_TABLET | Freq: Four times a day (QID) | ORAL | Status: DC | PRN

## 2023-08-21 MED ORDER — DIPHENHYDRAMINE HCL 50 MG/ML IJ SOLN: 50.0000 mg | Freq: Once | INTRAMUSCULAR | Status: AC

## 2023-08-21 NOTE — Progress Notes (Signed)
Dr. Crosley at bedside 

## 2023-08-21 NOTE — Progress Notes (Signed)
This nurse received report from night shift RN. Pt was receiving 1/2 units of blood. This nurse found blood admin form attached to bag of blood without signature of previous nurse. End time documented on admin form.

## 2023-08-21 NOTE — Progress Notes (Signed)
Received pt from the ED. Pt alert and oriented x4. Able to move all extremities. No distress noted.

## 2023-08-21 NOTE — Progress Notes (Incomplete)
    Patient Name: Kaitlin Villarreal           DOB: 05/22/1926  MRN: [id number]      Admission Date: 08/20/2023  Attending Provider: [place], Eric J, DO  Primary Diagnosis: GI bleed   Level of care: Telemetry Medical    CROSS COVER NOTE   Date of Service   08/21/2023   [name], 89 y.o. female, was admitted on 08/20/2023 for GI bleed.    HPI/Events of Note   Acute Blood Loss Anemia Acute lower GI bleed  hemoglobin 8.9 -->  6.5.  No acute changes reported.  Hemodynamically stable. No melena, hematochezia, or other bleeding reported tonight.  RN reports patient had 2 bloody BMs earlier this afternoon.   Interventions/ Plan   Blood transfusion, 1 unit PRBC Recheck H&H after transfusion is completed, transfuse if HGB <7 CT angio GI bleed-pending.  Will be done at 2 AM per RN.         Anthoney Harada, DNP, ACNPC- AG [facility]

## 2023-08-21 NOTE — Progress Notes (Signed)
   08/21/23 2256  Provider Notification  Provider Name/Title A. Virgel Manifold, NP  Date Provider Notified 08/21/23  Time Provider Notified 2252  Method of Notification  (secure chat)  Notification Reason Critical Result  Test performed and critical result HGB 6.5  Date Critical Result Received 08/21/23  Time Critical Result Received 2247  Provider response See new orders (1 unit of blood)  Date of Provider Response 08/21/23  Time of Provider Response 2259

## 2023-08-21 NOTE — Progress Notes (Signed)
   08/21/23 0543  Provider Notification  Provider Name/Title Crosley MD  Date Provider Notified 08/21/23  Time Provider Notified 0543  Method of Notification  (secure chat)  Notification Reason Critical Result  Test performed and critical result HGB 5.9  Date Critical Result Received 08/21/23  Time Critical Result Received 0541  Provider response See new orders  Date of Provider Response 08/21/23  Time of Provider Response [phone]

## 2023-08-21 NOTE — Plan of Care (Signed)
  Problem: Clinical Measurements: Goal: Respiratory complications will improve Outcome: Progressing Goal: Cardiovascular complication will be avoided Outcome: Progressing   Problem: Activity: Goal: Risk for activity intolerance will decrease Outcome: Progressing   Problem: Nutrition: Goal: Adequate nutrition will be maintained Outcome: Progressing   Problem: Elimination: Goal: Will not experience complications related to urinary retention Outcome: Progressing   Problem: Pain Management: Goal: General experience of comfort will improve Outcome: Progressing   Problem: Safety: Goal: Ability to remain free from injury will improve Outcome: Progressing   Problem: Skin Integrity: Goal: Risk for impaired skin integrity will decrease Outcome: Progressing

## 2023-08-21 NOTE — Consult Note (Addendum)
Attending physician's note   I have taken a history, reviewed the chart, and examined the patient. I performed a substantive portion of this encounter, including complete performance of at least one of the key components, in conjunction with the APP. I agree with the APP's note, impression, and recommendations with my edits.   89 year old female with medical history as outlined below, admitted with large-volume painless hematochezia at home with associated anemia.  Admission evaluation notable for the following:  - H/H 9.1/29 --> 5.9/18.5.  Ordered for 2 unit RBCs - Normal B12/folate - Renal function at baseline with BUN/creatinine 36/1.3, otherwise normal CMP - PT/INR 12.6/0.9  1) Hematochezia 2) Symptomatic anemia 3) Acute blood loss anemia - CTA now - Posttransfusion CBC check with continued serial CBCs and additional blood products as needed per protocol - N.p.o. for the time being - Inpatient GI service will continue to follow    Doristine Locks, DO, FACG [phone] office         Consultation  Referring Provider: [facility] Primary Care Physician:  Annamaria Helling, DO Primary Gastroenterologist: Gentry Fitz  Reason for Consultation: Acute lower GI bleed  HPI: Kaitlin Villarreal is a 89 y.o. female with no prior history of GI bleeding who presented to the emergency room late last evening after she had onset of a large grossly bloody bowel movement at home.  She says this was not associated with any abdominal pain or cramping, she did not have any associated nausea or vomiting, no syncope or presyncope. She is not taking any blood thinners, does take baby aspirin daily. She believes that she had a colonoscopy 10 to 15 years ago, I do not see any reports in epic or Care Everywhere.  On arrival to the emergency room, hemodynamically stable, and has remained dynamically stable overnight.  Viewing her chart she does have a chronic anemia with hemoglobin baseline in  the 10 range. Globin 9.1 on arrival last evening, WBC of 6.1/MCV 101, platelets 247 BUN 36/creatinine 1.31 L FTs within normal limits Pro time 12.6/INR 0.9  Hemoglobin dropped to 5.9 early this morning-transfused 2 units of packed RBCs. Nursing reports that she had at least 2 grossly bloody bowel movements last evening, the last one was about 6:30 AM.  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    Past Medical History:  Diagnosis Date   Acute on chronic diastolic heart failure (HCC) 03/18/2018   Aortic stenosis 10/14/2014   Formatting of this note might be different from the original.  moderate by echo 11/2015   Arthritis    Bilateral renal masses 07/26/2017   Bone spur of toe of left foot    Chronic diastolic (congestive) heart failure (HCC)    Chronic diastolic heart failure (HCC) 03/18/2018   CKD (chronic kidney disease) stage 3, GFR 30-59 ml/min (HCC) 07/26/2017   Essential hypertension    GERD (gastroesophageal reflux disease)    HLD (hyperlipidemia)    Hyperlipidemia 11/25/2016   Pacemaker 09/02/2018   Pancreatic lesion 04/17/2018   Pancreatic mass    a. benign appearing but needs f/u, noted on pre TAVR CTs   Plantar fat pad atrophy of left foot  S/P TAVR (transcatheter aortic valve replacement) 03/18/2018   23 mm Edwards Sapien 3 transcatheter heart valve placed via percutaneous right transfemoral approach    Severe aortic stenosis    a. 03/2018: s/p TAVR by Excell Seltzer and Dr. Cornelius Moras   Stage 3 chronic kidney disease (HCC)    TIA (transient ischemic attack)     a. 1992   VSD (ventricular septal defect)     Past Surgical  History:  Procedure Laterality Date   ABDOMINAL HYSTERECTOMY     APPENDECTOMY     HERNIA REPAIR     INTRAOPERATIVE TRANSTHORACIC ECHOCARDIOGRAM N/A 03/18/2018   Procedure: INTRAOPERATIVE TRANSTHORACIC ECHOCARDIOGRAM;  Surgeon: Tonny Bollman, MD;  Location: [facility] OR;  Service: Open Heart Surgery;  Laterality: N/A;   PACEMAKER IMPLANT N/A 03/20/2018   Procedure: PACEMAKER IMPLANT;  Surgeon: Regan Lemming, MD;  Location: [facility] INVASIVE CV LAB;  Service: Cardiovascular;  Laterality: N/A;   RIGHT HEART CATH N/A 03/20/2018   Procedure: RIGHT HEART CATH;  Surgeon: Tonny Bollman, MD;  Location: [facility] INVASIVE CV LAB;  Service: Cardiovascular;  Laterality: N/A;   RIGHT/LEFT HEART CATH AND CORONARY ANGIOGRAPHY N/A 02/06/2018   Procedure: RIGHT/LEFT HEART CATH AND CORONARY ANGIOGRAPHY;  Surgeon: Tonny Bollman, MD;  Location: [facility] INVASIVE CV LAB;  Service: Cardiovascular;  Laterality: N/A;   TONSILLECTOMY     TRANSCATHETER AORTIC VALVE REPLACEMENT, TRANSFEMORAL N/A 03/18/2018   Procedure: TRANSCATHETER AORTIC VALVE REPLACEMENT, TRANSFEMORAL;  Surgeon: Tonny Bollman, MD;  Location: [facility] OR;  Service: Open Heart Surgery;  Laterality: N/A;   WISDOM TOOTH EXTRACTION      Prior to Admission medications   Medication Sig Start Date End Date Taking? Authorizing Provider  acetaminophen (TYLENOL) 650 MG CR tablet Take 650-1,300 mg by mouth every 8 (eight) hours as needed for pain.   Yes [provider]  allopurinol (ZYLOPRIM) 100 MG tablet Take 100 mg by mouth daily.   Yes [provider]  amLODipine (NORVASC) 5 MG tablet Take 5 mg by mouth daily.   Yes [provider]  aspirin EC 81 MG tablet Take 81 mg by mouth daily.    Yes [provider]  Carboxymethylcellulose Sod PF (THERATEARS PF) 0.25 % SOLN Place 1 drop into both eyes in the morning and at bedtime.   Yes [provider]  Cyanocobalamin (B-12) 2500 MCG TABS Take 2,500 mcg by mouth daily.    Yes [provider]  estradiol (ESTRACE) 1 MG tablet Take 0.5 mg by mouth daily.   Yes [provider]  fluticasone (FLONASE) 50 MCG/ACT nasal spray Place 2 sprays into both nostrils daily as needed for allergies or rhinitis. 11/24/19  Yes [provider]  furosemide (LASIX) 20 MG tablet TAKE 1 TABLET BY MOUTH DAILY Patient taking differently: Take 20 mg by mouth See admin instructions. Take 20mg  by mouth on Monday, Wednesday, and Friday. 06/28/23  Yes Baldo Daub, MD  hydrALAZINE (APRESOLINE) 25 MG tablet Take 1 tablet (25 mg total) by mouth 3 (three) times daily as needed (when BP is over 180). Patient taking differently: Take 25 mg by mouth 3 (three) times daily. 08/09/23  Yes Baldo Daub, MD  lisinopril (ZESTRIL) 40 MG tablet Take 40 mg by mouth daily. 12/01/22  Yes [provider]  metoprolol tartrate (LOPRESSOR) 50 MG tablet Take 1 tablet by mouth twice daily 04/13/22  Yes Munley, Iline Oven, MD  Multiple Vitamins-Minerals (MULTI-VITAMIN GUMMIES) CHEW Chew 2 tablets by mouth daily.    Yes [provider]  Omega-3 Fatty Acids (FISH OIL) 500 MG CAPS Take 3 capsules by mouth daily.   Yes [provider]  Vitamin D, Ergocalciferol, (DRISDOL) 1.25 MG (50000 UT) CAPS capsule Take 50,000 Units by mouth See admin instructions. Take 50,000 units by mouth 2 times a week- Mondays and Fridays 08/25/18  Yes [provider]    Current Facility-Administered Medications  Medication Dose Route Frequency Provider Last Rate Last Admin   0.9 %  sodium chloride infusion (Manually program via Guardrails IV Fluids)   Intravenous Once Gery Pray, MD       acetaminophen (TYLENOL) tablet 650 mg  650 mg Oral Q6H PRN Crosley, Debby, MD       Or   acetaminophen (TYLENOL) suppository 650 mg  650 mg Rectal Q6H PRN Crosley, Debby, MD       amLODipine (NORVASC) tablet 5 mg  5 mg Oral Daily Crosley, Debby, MD   5 mg at 08/21/23 0530   diphenhydrAMINE (BENADRYL) injection  50 mg  50 mg Intravenous On Call [place], Eric J, DO       hydrALAZINE (APRESOLINE) tablet 10 mg  10 mg Oral Q6H PRN [place], Eric J, DO       lactated ringers infusion   Intravenous Continuous Gery Pray, MD 50 mL/hr at 08/21/23 0120 New Bag at 08/21/23 0120   methylPREDNISolone sodium succinate (SOLU-MEDROL) 40 mg/mL injection 40 mg  40 mg Intravenous On Call [place], Eric J, DO       Followed by   methylPREDNISolone sodium succinate (SOLU-MEDROL) 40 mg/mL injection 40 mg  40 mg Intravenous On Call [place], Eric J, DO       ondansetron [facility]) tablet 4 mg  4 mg Oral Q6H PRN Gery Pray, MD       Or   ondansetron (ZOFRAN) injection 4 mg  4 mg Intravenous Q6H PRN Crosley, Debby, MD       pantoprazole (PROTONIX) injection 40 mg  40 mg Intravenous Q12H Crosley, Debby, MD   40 mg at 08/21/23 0846    Allergies as of 08/20/2023 - Review Complete 08/20/2023  Allergen Reaction Noted   Clarithromycin Other (See Comments) 01/28/2017   Latex Rash and Other (See Comments) 01/28/2017   Levofloxacin Other (See Comments) 11/27/2016   Azithromycin Other (See Comments) 01/28/2017   Diphenhydramine hcl  06/19/2022   Ivp dye [iodinated contrast media]  05/17/2021    Family History  Problem Relation Age of Onset   Heart disease Mother    Uterine cancer Sister    Congenital heart disease Brother    Heart attack Brother     Social History   Socioeconomic History   Marital status: Married    Spouse name: Not on file   Number of children: Not on file   Years of education: Not on file   Highest education level: Not on file  Occupational History   Not on file  Tobacco Use   Smoking status: Never    Passive exposure: Never   Smokeless tobacco: Never  Vaping Use   Vaping status: Never Used  Substance and Sexual Activity   Alcohol use: No   Drug use: No   Sexual activity: Not on file  Other Topics Concern   Not on file  Social History Narrative   Not on file   Social Determinants  of Health   Financial Resource Strain: Not on file  Food Insecurity: No Food Insecurity (08/21/2023)   Hunger Vital Sign    Worried About Running Out of  Food in the Last Year: Never true    Ran Out of Food in the Last Year: Never true  Transportation Needs: No Transportation Needs (08/21/2023)   PRAPARE - Administrator, Civil Service (Medical): No    Lack of Transportation (Non-Medical): No  Physical Activity: Not on file  Stress: Not on file  Social Connections: Not on file  Intimate Partner Violence: Not At Risk (08/21/2023)   Humiliation, Afraid, Rape, and Kick questionnaire    Fear of Current or Ex-Partner: No    Emotionally Abused: No    Physically Abused: No    Sexually Abused: No    Review of Systems: Pertinent positive and negative review of systems were noted in the above HPI section.  All other review of systems was otherwise negative.   Physical Exam: Vital signs in last 24 hours: Temp:  [97.5 F (36.4 C)-98.9 F (37.2 C)] 97.6 F (36.4 C) (11/20 1059) Pulse Rate:  [44-79] 73 (11/20 1059) Resp:  [15-26] 19 (11/20 1059) BP: (126-185)/(28-54) 139/34 (11/20 1059) SpO2:  [95 %-98 %] 98 % (11/20 0541) Last BM Date : 08/21/23 General:   Alert,  Well-developed, petite, frail very elderly white female, pleasant and cooperative in NAD Head:  Normocephalic and atraumatic. Eyes:  Sclera clear, no icterus.   Conjunctiva pale Ears:  Normal auditory acuity. Nose:  No deformity, discharge,  or lesions. Mouth:  No deformity or lesions.   Neck:  Supple; no masses or thyromegaly. Lungs:  Clear throughout to auscultation.   No wheezes, crackles, or rhonchi.  Heart:  Regular rate and rhythm; no murmurs, clicks, rubs,  or gallops.  Pacemaker /chest wall Abdomen:  Soft,nontender, BS active,nonpalp mass or hsm.   Rectal: Not done, stool noted grossly heme positive in the ER with blood clots on her undergarments. Msk:  Symmetrical without gross deformities. . Pulses:   Normal pulses noted. Extremities:  Without clubbing or edema. Neurologic:  Alert and  oriented x4;  grossly normal neurologically. Skin:  Intact without significant lesions or rashes.. Psych:  Alert and cooperative. Normal mood and affect.  Intake/Output from previous day: 11/19 0701 - 11/20 0700 In: 91.2 [I.V.:91.2] Out: -  Intake/Output this shift: Total I/O In: 360 [Blood:360] Out: -   Lab Results: Recent Labs    08/20/23 2059 08/21/23 0449  WBC 6.1 5.6  HGB 9.1* 5.9*  HCT 29.0* 18.5*  PLT 247 190   BMET Recent Labs    08/20/23 2059 08/21/23 0813  NA 143 144  K 4.4 4.6  CL 111 115*  CO2 24 22  GLUCOSE 101* 94  BUN 36* 36*  CREATININE 1.31* 1.44*  CALCIUM 9.2 8.2*   LFT Recent Labs    08/20/23 2059  PROT 7.1  ALBUMIN 3.7  AST 34  ALT 18  ALKPHOS 53  BILITOT 0.7   PT/INR Recent Labs    08/20/23 2115  LABPROT 12.6  INR 0.9   Hepatitis Panel No results for input(s): "HEPBSAG", "HCVAB", "HEPAIGM", "HEPBIGM" in the last 72 hours.    IMPRESSION:  #66 89 year old white female presenting with acute major lower GI bleed onset yesterday evening. She has remained hemodynamically stable but has dropped hemoglobin from 9.1 on presentation to 5.9 this morning.  Appears to have a baseline hemoglobin in the 10 range  Bleeding has been painless No prior history of GI bleeding  Suspect diverticular though we do not have any prior endoscopic evidence of diverticulosis, or imaging to confirm diverticulosis. Consider occult neoplasm, AVMs,  doubt of upper GI etiology.  #2 Acute severe anemia secondary to above in setting of chronic anemia #3 chronic kidney disease stage IIIb #4.  Status post total aortic valve replacement, no chronic anticoagulation #5.  History of heart block status post pacemaker #6.  Hypertension #7.  Diastolic congestive heart failure   PLAN: Keep n.p.o. for now Serial hemoglobins and transfuse to keep her hemoglobin 7 or  greater Agree with CTA which has been ordered stat, if CTA positive then will need urgent IR consultation for angiography and embolization. Cover with IV PPI as doing  Bedrest today No current plans for endoscopic evaluation but that may change depending on CTA results.  GI will follow with you   Amy Esterwood PA-C 08/21/2023, 11:50 AM

## 2023-08-21 NOTE — H&P (Incomplete)
PCP:   Annamaria Helling, DO   Chief Complaint:  Bright red blood per rectum  HPI: This 89 year old female with past medical history of CKD stage III, HFpEF, HLD, HTN, history of severe aortic stenosis sp TAVR, history of CHB sp PPM.  Tonight patient with to the restroom, BM.  The commode filled with bright red blood.  She denies abdominal pain, history of GI bleed.     Review of Systems:  Per HPI  Past Medical History: Past Medical History:  Diagnosis Date  . Acute on chronic diastolic heart failure (HCC) 03/18/2018  . Aortic stenosis 10/14/2014   Formatting of this note might be different from the original.  moderate by echo 11/2015  . Arthritis   . Bilateral renal masses 07/26/2017  . Bone spur of toe of left foot   . Chronic diastolic (congestive) heart failure (HCC)   . Chronic diastolic heart failure (HCC) 03/18/2018  . CKD (chronic kidney disease) stage 3, GFR 30-59 ml/min (HCC) 07/26/2017  . Essential hypertension   . GERD (gastroesophageal reflux disease)   . HLD (hyperlipidemia)   . Hyperlipidemia 11/25/2016  . Pacemaker 09/02/2018  . Pancreatic lesion 04/17/2018  . Pancreatic mass    a. benign appearing but needs f/u, noted on pre TAVR CTs  . 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 Severe aortic stenosis    a. 03/2018: s/p TAVR by Excell Seltzer and Dr. Cornelius Moras  . Stage 3 chronic kidney disease (HCC)   . TIA (transient ischemic attack)     a. 1992  . VSD (ventricular septal defect)    Past Surgical History:  Procedure Laterality Date  . ABDOMINAL HYSTERECTOMY    . APPENDECTOMY    . HERNIA REPAIR    . INTRAOPERATIVE TRANSTHORACIC ECHOCARDIOGRAM N/A 03/18/2018   Procedure: INTRAOPERATIVE TRANSTHORACIC ECHOCARDIOGRAM;  Surgeon: Tonny Bollman, MD;  Location: [facility] OR;  Service: Open Heart Surgery;  Laterality:  N/A;  . PACEMAKER IMPLANT N/A 03/20/2018   Procedure: PACEMAKER IMPLANT;  Surgeon: Regan Lemming, MD;  Location: [facility] INVASIVE CV LAB;  Service: Cardiovascular;  Laterality: N/A;  . RIGHT HEART CATH N/A 03/20/2018   Procedure: RIGHT HEART CATH;  Surgeon: Tonny Bollman, MD;  Location: [facility] INVASIVE CV LAB;  Service: Cardiovascular;  Laterality: N/A;  . RIGHT/LEFT HEART CATH AND CORONARY ANGIOGRAPHY N/A 02/06/2018   Procedure: RIGHT/LEFT HEART CATH AND CORONARY ANGIOGRAPHY;  Surgeon: Tonny Bollman, MD;  Location: [facility] INVASIVE CV LAB;  Service: Cardiovascular;  Laterality: N/A;  . TONSILLECTOMY    . TRANSCATHETER AORTIC VALVE REPLACEMENT, TRANSFEMORAL N/A 03/18/2018   Procedure: TRANSCATHETER AORTIC VALVE REPLACEMENT, TRANSFEMORAL;  Surgeon: Tonny Bollman, MD;  Location: [facility] OR;  Service: Open Heart Surgery;  Laterality: N/A;  . WISDOM TOOTH EXTRACTION      Medications: Prior to Admission medications   Medication Sig Start Date End Date Taking? Authorizing Provider  acetaminophen (TYLENOL) 650 MG CR tablet Take 650-1,300 mg by mouth every 8 (eight) hours as needed for pain.   Yes [provider]  allopurinol (ZYLOPRIM) 100 MG tablet Take 100 mg by mouth daily.   Yes [provider]  amLODipine (NORVASC) 5 MG tablet Take 5 mg by mouth daily.   Yes [provider]  aspirin EC 81 MG tablet Take 81 mg by mouth daily.    Yes [provider]  Carboxymethylcellulose Sod PF (THERATEARS PF) 0.25 % SOLN Place 1 drop into both eyes in the morning and at bedtime.   Yes [provider]  Cyanocobalamin (B-12) 2500 MCG TABS Take 2,500 mcg by mouth daily.    Yes [provider]  estradiol (ESTRACE) 1 MG tablet Take 0.5 mg by mouth daily.   Yes [provider]  fluticasone (FLONASE) 50 MCG/ACT nasal spray Place 2 sprays into both nostrils daily as needed for allergies or rhinitis. 11/24/19  Yes [provider]  furosemide (LASIX) 20 MG  tablet TAKE 1 TABLET BY MOUTH DAILY Patient taking differently: Take 20 mg by mouth See admin instructions. Take 20mg  by mouth on Monday, Wednesday, and Friday. 06/28/23  Yes Baldo Daub, MD  hydrALAZINE (APRESOLINE) 25 MG tablet Take 1 tablet (25 mg total) by mouth 3 (three) times daily as needed (when BP is over 180). Patient taking differently: Take 25 mg by mouth 3 (three) times daily. 08/09/23  Yes Baldo Daub, MD  lisinopril (ZESTRIL) 40 MG tablet Take 40 mg by mouth daily. 12/01/22  Yes [provider]  metoprolol tartrate (LOPRESSOR) 50 MG tablet Take 1 tablet by mouth twice daily 04/13/22  Yes Munley, Iline Oven, MD  Multiple Vitamins-Minerals (MULTI-VITAMIN GUMMIES) CHEW Chew 2 tablets by mouth daily.    Yes [provider]  Omega-3 Fatty Acids (FISH OIL) 500 MG CAPS Take 3 capsules by mouth daily.   Yes [provider]  Vitamin D, Ergocalciferol, (DRISDOL) 1.25 MG (50000 UT) CAPS capsule Take 50,000 Units by mouth See admin instructions. Take 50,000 units by mouth 2 times a week- Mondays and Fridays 08/25/18  Yes [provider]    Allergies:   Allergies  Allergen Reactions  . Clarithromycin Other (See Comments)    Felt like body was swollen, felt awful"- and, angioedema  Other Reaction(s): Angioedema*  ask, Felt like body was swollen, felt awful"- and, angioedema  . Latex Rash and Other (See Comments)    Causes sores and Rash-Generalized  . Levofloxacin Other (See Comments)    Hallucinations   . Azithromycin Other (See Comments)    Reaction??  . Diphenhydramine Hcl     Other reaction(s): Unknown  . Ivp Dye [Iodinated Contrast Media]     Mildly jasungide  Social History:  reports that she has never smoked. She has never been exposed to tobacco smoke. She has never used smokeless tobacco. She reports that she does not drink alcohol and does not use drugs.  Family History: Family History  Problem Relation Age of Onset  . Heart disease Mother   . Uterine cancer Sister   . Congenital heart disease Brother   . Heart attack Brother     Physical Exam: Vitals:   08/20/23 2045 08/20/23 2053 08/20/23 2100 08/20/23 2115  BP: (!) 173/54  (!) 177/47 (!) 185/46  Pulse:  79 75 74  Resp:  (!) 25 (!) 26 (!) 26  Temp:    98.9 F (37.2 C)  TempSrc:    Oral  SpO2:  96% 97% 95%    General:  Alert and oriented times three, well developed and nourished, no acute distress Eyes: PERRLA, pink conjunctiva, no scleral icterus ENT: Moist oral mucosa, neck supple, no thyromegaly Lungs: clear to ascultation, no wheeze, no crackles, no use of accessory muscles Cardiovascular: regular rate and rhythm, no regurgitation, no gallops, no murmurs. No carotid bruits, no JVD Abdomen: soft, positive BS, non-tender, non-distended, no organomegaly, not an acute abdomen GU: not examined Neuro: CN II - XII grossly intact, sensation intact Musculoskeletal: strength 5/5 all extremities, no clubbing, cyanosis or edema Skin: no rash, no subcutaneous crepitation, no decubitus Psych: appropriate patient   Labs on Admission:  Recent Labs    08/20/23 2059  NA 143  K 4.4  CL 111  CO2 24  GLUCOSE 101*  BUN 36*  CREATININE 1.31*  CALCIUM 9.2   Recent Labs    08/20/23 2059  AST 34  ALT 18  ALKPHOS 53  BILITOT 0.7  PROT 7.1  ALBUMIN 3.7    Recent Labs    08/20/23 2059  WBC 6.1  NEUTROABS 4.0  HGB 9.1*  HCT 29.0*  MCV 101.4*  PLT  247    Radiological Exams on Admission: No results found.  Assessment/Plan Present on Admission: . Chronic diastolic (congestive) heart failure (HCC) . CKD (chronic kidney disease) stage 3, GFR 30-59 ml/min (HCC) . Essential hypertension . HLD (hyperlipidemia) . Stage 3 chronic kidney disease due to benign hypertension (HCC)   Kaitlin Villarreal 08/20/2023, 10:15 PM

## 2023-08-21 NOTE — Progress Notes (Signed)
PROGRESS NOTE    [name]  Villarreal:[id number] DOB: [birth year] DOA: 08/20/2023 PCP: Annamaria Helling, DO    Brief Narrative:   Kaitlin Villarreal is a 89 y.o. female with past medical history significant for chronic diastolic congestive heart failure, HTN, HLD, history of severe aortic stenosis s/p TAVR, complete heart block s/p PPM, CKD stage IIIb cognitive impairment who presented to [facility] ED on 11/19 from home via EMS due to rectal bleeding.  Patient reports while going to the restroom, had bowel movement with commode filled with bright red blood.  Utilizes aspirin 81 mg p.o. daily but no other anticoagulants outpatient.  Denies abdominal pain, no previous history of GI bleed.  Also distant/remote history of colonoscopy.  In the ED, temperature 98.9 F, HR 79, RR 25, BP 173/54, SpO2 96% on room air.  WBC 6.1, hemoglobin 9.1, platelet count 247.  Sodium 143, potassium 4.4, chloride 111, CO2 24, glucose 101, BUN 36, creatinine 1.31.  AST 34, ALT 18, total bilirubin 0.7.  INR 0.9.  FOBT positive.  EDP consulted gastroenterology, Dr. Leone Payor.  TRH consulted for admission for further evaluation and management of acute lower GI bleed.  Assessment & Plan:   Lower GI bleed, acute Patient presenting to ED with bright red blood per rectum.  Hemoglobin 9.1 on admission.  FOBT positive.  On aspirin 81 mg p.o. daily outpatient, no other anticoagulant/antiplatelet.  No previous history of GI bleed. -- [facility] following, appreciate assistance -- Hgb 9.1>5.9 -- Transfusing 2 unit PRBC; repeat hemoglobin 2 hours following transfusion -- Holding home aspirin -- H&H every 6 hours; continue to transfuse for active bleeding/hemoglobin less than 7.0 -- CT angio abd/pel GI bleed study: Pending  Chronic diastolic congestive heart failure Essential hypertension Home regimen includes metoprolol tartrate 50 mg p.o. twice daily, amlodipine 5 mg p.o. daily, lisinopril 40 mg p.o. daily, furosemide 20 mg on  Monday/Wednesday/Friday, hydralazine as needed. -- Amlodipine 5 mg PO daily -- Hold home furosemide/lisinopril -- Hydralazine 10mg  PO q6h PRN SBP >170 or DBP >110   History of severe aortic stenosis s/p TAVR TTE 03/2022 with LVEF 70%, normal functioning of her bioprosthetic valve. --Outpatient follow-up with cardiology  CKD stage IIIb Baseline creatinine 1.4-1.5 over the past 1 year. -- Cr 1.31>1.44 -- Hold home lisinopril for now -- Blood transfusion, IV fluid hydration with LR at 50 mL/h -- BMP daily  Cognitive impairment -- Delirium precautions -- Get up during the day -- Encourage a familiar face to remain present throughout the day -- Keep blinds open and lights on during daylight hours -- Minimize the use of opioids/benzodiazepines   DVT prophylaxis: SCDs Start: 08/20/23 2359    Code Status: Limited: Do not attempt resuscitation (DNR) -DNR-LIMITED -Do Not Intubate/DNI  Family Communication: Attempted to update patient's daughter Kaitlin Villarreal via telephone, went to voicemail  Disposition Plan:  Level of care: Med-Surg Status is: Inpatient Remains inpatient appropriate because: Active GI bleed, requiring blood transfusion, pending CT angiogram GI bleed study and specialist evaluation    Consultants:  [facility]  Procedures:  None  Antimicrobials:  None   Subjective: Patient seen examined bedside, resting, no.  Lying in bed.  Continues to have bloody bowel movements overnight.  Hemoglobin down to 5.9, being transfused 2 unit PRBCs this morning.  Discussed with GI PA this morning, pending CT angiogram GI bleed study.  Patient is hemodynamically stable, patient with no other specific complaints other than persistent bloody stool.  No family present at bedside, attempted  to update patient's daughter via telephone, unsuccessful.  Denies headache, no dizziness, no chest pain, no palpitations, no shortness of breath, no abdominal pain, no fever/chills/night sweats, no  nausea/vomiting/diarrhea, no focal weakness, no fatigue, no paresthesias.  No other acute events overnight per nursing staff.  Objective: Vitals:   08/21/23 0541 08/21/23 0826 08/21/23 1038 08/21/23 1044  BP: (!) 143/30 (!) 145/28  (!) 145/30  Pulse: 69 70  64  Resp: 18 16 16 16   Temp: 97.9 F (36.6 C) 97.6 F (36.4 C) 97.9 F (36.6 C) 97.9 F (36.6 C)  TempSrc: Oral Oral Oral Oral  SpO2: 98%       Intake/Output Summary (Last 24 hours) at 08/21/2023 1101 Last data filed at 08/21/2023 0865 Gross per 24 hour  Intake 451.21 ml  Output --  Net 451.21 ml   There were no vitals filed for this visit.  Examination:  Physical Exam: GEN: NAD, alert and oriented x 3, elderly appearance HEENT: NCAT, PERRL, EOMI, sclera clear, MMM PULM: CTAB w/o wheezes/crackles, normal respiratory effort, on room air CV: RRR w/o M/G/R GI: abd soft, NTND, NABS, no R/G/M; noted bloody stool MSK: no peripheral edema, muscle strength globally intact 5/5 bilateral upper/lower extremities NEURO: CN II-XII intact, no focal deficits, sensation to light touch intact PSYCH: normal mood/affect Integumentary: No concerning rashes/lesions/wounds noted on exposed skin surfaces    Data Reviewed: I have personally reviewed following labs and imaging studies  CBC: Recent Labs  Lab 08/20/23 2059 08/21/23 0449  WBC 6.1 5.6  NEUTROABS 4.0  --   HGB 9.1* 5.9*  HCT 29.0* 18.5*  MCV 101.4* 100.0  PLT 247 190   Basic Metabolic Panel: Recent Labs  Lab 08/20/23 2059 08/21/23 0813  NA 143 144  K 4.4 4.6  CL 111 115*  CO2 24 22  GLUCOSE 101* 94  BUN 36* 36*  CREATININE 1.31* 1.44*  CALCIUM 9.2 8.2*   GFR: CrCl cannot be calculated (Unknown ideal weight.). Liver Function Tests: Recent Labs  Lab 08/20/23 2059  AST 34  ALT 18  ALKPHOS 53  BILITOT 0.7  PROT 7.1  ALBUMIN 3.7   No results for input(s): "LIPASE", "AMYLASE" in the last 168 hours. No results for input(s): "AMMONIA" in the last 168  hours. Coagulation Profile: Recent Labs  Lab 08/20/23 2115  INR 0.9   Cardiac Enzymes: No results for input(s): "CKTOTAL", "CKMB", "CKMBINDEX", "TROPONINI" in the last 168 hours. BNP (last 3 results) Recent Labs    12/10/22 1146  PROBNP 4,407*   HbA1C: No results for input(s): "HGBA1C" in the last 72 hours. CBG: No results for input(s): "GLUCAP" in the last 168 hours. Lipid Profile: No results for input(s): "CHOL", "HDL", "LDLCALC", "TRIG", "CHOLHDL", "LDLDIRECT" in the last 72 hours. Thyroid Function Tests: No results for input(s): "TSH", "T4TOTAL", "FREET4", "T3FREE", "THYROIDAB" in the last 72 hours. Anemia Panel: Recent Labs    08/21/23 0449  VITAMINB12 5,940*  FOLATE >40.0   Sepsis Labs: No results for input(s): "PROCALCITON", "LATICACIDVEN" in the last 168 hours.  No results found for this or any previous visit (from the past 240 hour(s)).       Radiology Studies: No results found.      Scheduled Meds:  sodium chloride   Intravenous Once   amLODipine  5 mg Oral Daily   lisinopril  10 mg Oral Daily   pantoprazole (PROTONIX) IV  40 mg Intravenous Q12H   Continuous Infusions:  lactated ringers 50 mL/hr at 08/21/23 0120  LOS: 1 day    Time spent: 56 minutes spent on chart review, discussion with nursing staff, consultants, updating family and interview/physical exam; more than 50% of that time was spent in counseling and/or coordination of care.    Alvira Philips [place], DO [facility] Available via Epic secure chat 7am-7pm After these hours, please refer to coverage provider listed on [email or web address] 08/21/2023, 11:01 AM

## 2023-08-22 ENCOUNTER — Inpatient Hospital Stay (HOSPITAL_COMMUNITY): Payer: Medicare Other

## 2023-08-22 DIAGNOSIS — K5731 Diverticulosis of large intestine without perforation or abscess with bleeding: Secondary | ICD-10-CM

## 2023-08-22 DIAGNOSIS — D62 Acute posthemorrhagic anemia: Secondary | ICD-10-CM | POA: Diagnosis not present

## 2023-08-22 DIAGNOSIS — K922 Gastrointestinal hemorrhage, unspecified: Secondary | ICD-10-CM | POA: Diagnosis not present

## 2023-08-22 HISTORY — DX: Diverticulosis of large intestine without perforation or abscess with bleeding: K57.31

## 2023-08-22 LAB — BASIC METABOLIC PANEL
Anion gap: 7 (ref 5–15)
BUN: 47 mg/dL — ABNORMAL HIGH (ref 8–23)
CO2: 21 mmol/L — ABNORMAL LOW (ref 22–32)
Calcium: 7.8 mg/dL — ABNORMAL LOW (ref 8.9–10.3)
Chloride: 113 mmol/L — ABNORMAL HIGH (ref 98–111)
Creatinine, Ser: 1.79 mg/dL — ABNORMAL HIGH (ref 0.44–1.00)
GFR, Estimated: 26 mL/min — ABNORMAL LOW (ref >60)
Glucose, Bld: 162 mg/dL — ABNORMAL HIGH (ref 70–99)
Potassium: 4.6 mmol/L (ref 3.5–5.1)
Sodium: 141 mmol/L (ref 135–145)

## 2023-08-22 LAB — HEMOGLOBIN AND HEMATOCRIT, BLOOD
HCT: 27.6 % — ABNORMAL LOW (ref 36.0–46.0)
HCT: 31 % — ABNORMAL LOW (ref 36.0–46.0)
HCT: 29 % — ABNORMAL LOW (ref 36.0–46.0)
Hemoglobin: 9.3 g/dL — ABNORMAL LOW (ref 12.0–15.0)
Hemoglobin: 10.4 g/dL — ABNORMAL LOW (ref 12.0–15.0)
Hemoglobin: 9.9 g/dL — ABNORMAL LOW (ref 12.0–15.0)

## 2023-08-22 MED ORDER — LACTATED RINGERS IV SOLN: INTRAVENOUS | Status: DC

## 2023-08-22 MED ORDER — IOHEXOL 350 MG/ML SOLN
100.0000 mL | Freq: Once | INTRAVENOUS | Status: AC | PRN
  Administered 2023-08-22: 100 mL via INTRAVENOUS

## 2023-08-22 NOTE — Progress Notes (Signed)
PROGRESS NOTE    Kaitlin Villarreal  YSA:[id number] DOB: 09/18/1926 DOA: 08/20/2023 PCP: Annamaria Helling, DO    Brief Narrative:   Kaitlin Villarreal is a 89 y.o. female with past medical history significant for chronic diastolic congestive heart failure, HTN, HLD, history of severe aortic stenosis s/p TAVR, complete heart block s/p PPM, CKD stage IIIb cognitive impairment who presented to [facility] ED on 11/19 from home via EMS due to rectal bleeding.  Patient reports while going to the restroom, had bowel movement with commode filled with bright red blood.  Utilizes aspirin 81 mg p.o. daily but no other anticoagulants outpatient.  Denies abdominal pain, no previous history of GI bleed.  Also distant/remote history of colonoscopy.  In the ED, temperature 98.9 F, HR 79, RR 25, BP 173/54, SpO2 96% on room air.  WBC 6.1, hemoglobin 9.1, platelet count 247.  Sodium 143, potassium 4.4, chloride 111, CO2 24, glucose 101, BUN 36, creatinine 1.31.  AST 34, ALT 18, total bilirubin 0.7.  INR 0.9.  FOBT positive.  EDP consulted gastroenterology, Dr. Leone Payor.  TRH consulted for admission for further evaluation and management of acute lower GI bleed.  Assessment & Plan:   Lower GI bleed, acute Patient presenting to ED with bright red blood per rectum.  Hemoglobin 9.1 on admission.  FOBT positive.  On aspirin 81 mg p.o. daily outpatient, no other anticoagulant/antiplatelet.  No previous history of GI bleed.  CT angiogram GI bleed study with no evidence of active hemorrhage. -- [place] [facility] following, appreciate assistance -- Hgb 9.1>5.9>8.9>6.5>9.3 -- Transfuse 3 unit PRBCs  -- Holding home aspirin -- H&H every 6 hours; continue to transfuse for active bleeding/hemoglobin less than 7.0 -- Continue n.p.o. until seen by GI today  Chronic diastolic congestive heart failure Essential hypertension Home regimen includes metoprolol tartrate 50 mg p.o. twice daily, amlodipine 5 mg p.o. daily, lisinopril 40 mg p.o. daily,  furosemide 20 mg on Monday/Wednesday/Friday, hydralazine as needed. -- Amlodipine 5 mg PO daily -- Hold home furosemide/lisinopril -- Hydralazine 10mg  PO q6h PRN SBP >170 or DBP >110   History of severe aortic stenosis s/p TAVR TTE 03/2022 with LVEF 70%, normal functioning of her bioprosthetic valve. -- Outpatient follow-up with cardiology  CKD stage IIIb Baseline creatinine 1.4-1.5 over the past 1 year. -- Cr 1.31>1.44>1.79 -- Hold home lisinopril for now -- Blood transfusion, IV fluid hydration with LR at 50 mL/h -- BMP daily  Pancreatic cysts CT angiogram with stable tail pancreatic cyst measuring 1.5 cm, additional new cyst pancreatic body Ortho recommendation of MRCP with pancreatic protocol outpatient.  Cognitive impairment -- Delirium precautions -- Get up during the day -- Encourage a familiar face to remain present throughout the day -- Keep blinds open and lights on during daylight hours -- Minimize the use of opioids/benzodiazepines   DVT prophylaxis: SCDs Start: 08/20/23 2359    Code Status: Limited: Do not attempt resuscitation (DNR) -DNR-LIMITED -Do Not Intubate/DNI  Family Communication: Updated patient's daughter Chip Boer via telephone this morning  Disposition Plan:  Level of care: Telemetry Medical Status is: Inpatient Remains inpatient appropriate because: Active GI bleed, requiring blood transfusion, needs stability of hemoglobin before stay for discharge home, anticipate 2-3 days    Consultants:  [place] gastro[facility]ology  Procedures:  None  Antimicrobials:  None   Subjective: Patient seen examined bedside, resting, no.  Lying in bed.  Continued with bloody bowel movements yesterday, received 2 unit PRBC transfusion followed by 1 unit overnight.  CT angiogram GI  bleed study with no active hemorrhage appreciated.  Patient reports bloody stools have improved.  Hemoglobin up to 9.3 this morning.  No family present at bedside this morning; updated  patient's daughter Chip Boer via telephone this morning. Denies headache, no dizziness, no chest pain, no palpitations, no shortness of breath, no abdominal pain, no fever/chills/night sweats, no nausea/vomiting/diarrhea, no focal weakness, no fatigue, no paresthesias.  No other acute events overnight per nursing staff.  Objective: Vitals:   08/22/23 0139 08/22/23 0340 08/22/23 0900 08/22/23 0935  BP: (!) 134/42 (!) 142/49 (!) 165/49 (!) 142/49  Pulse: 66 69 65   Resp: 16 18 18    Temp: 97.8 F (36.6 C) 97.9 F (36.6 C) 98.4 F (36.9 C)   TempSrc: Oral Oral Oral   SpO2: 98% 96% 96%     Intake/Output Summary (Last 24 hours) at 08/22/2023 1058 Last data filed at 08/22/2023 0500 Gross per 24 hour  Intake 597 ml  Output --  Net 597 ml   There were no vitals filed for this visit.  Examination:  Physical Exam: GEN: NAD, alert and oriented x 3, elderly appearance HEENT: NCAT, PERRL, EOMI, sclera clear, MMM PULM: CTAB w/o wheezes/crackles, normal respiratory effort, on room air CV: RRR w/o M/G/R GI: abd soft, NTND, NABS, no R/G/M MSK: no peripheral edema, muscle strength globally intact 5/5 bilateral upper/lower extremities NEURO: CN II-XII intact, no focal deficits, sensation to light touch intact PSYCH: normal mood/affect Integumentary: No concerning rashes/lesions/wounds noted on exposed skin surfaces    Data Reviewed: I have personally reviewed following labs and imaging studies  CBC: Recent Labs  Lab 08/20/23 2059 08/21/23 0449 08/21/23 1655 08/21/23 2220 08/22/23 0456  WBC 6.1 5.6  --   --   --   NEUTROABS 4.0  --   --   --   --   HGB 9.1* 5.9* 8.9* 6.5* 9.3*  HCT 29.0* 18.5* 26.6* 18.5* 27.6*  MCV 101.4* 100.0  --   --   --   PLT 247 190  --   --   --    Basic Metabolic Panel: Recent Labs  Lab 08/20/23 2059 08/21/23 0813 08/22/23 0456  NA 143 144 141  K 4.4 4.6 4.6  CL 111 115* 113*  CO2 24 22 21*  GLUCOSE 101* 94 162*  BUN 36* 36* 47*  CREATININE 1.31*  1.44* 1.79*  CALCIUM 9.2 8.2* 7.8*   GFR: CrCl cannot be calculated (Unknown ideal weight.). Liver Function Tests: Recent Labs  Lab 08/20/23 2059  AST 34  ALT 18  ALKPHOS 53  BILITOT 0.7  PROT 7.1  ALBUMIN 3.7   No results for input(s): "LIPASE", "AMYLASE" in the last 168 hours. No results for input(s): "AMMONIA" in the last 168 hours. Coagulation Profile: Recent Labs  Lab 08/20/23 2115  INR 0.9   Cardiac Enzymes: No results for input(s): "CKTOTAL", "CKMB", "CKMBINDEX", "TROPONINI" in the last 168 hours. BNP (last 3 results) Recent Labs    12/10/22 1146  PROBNP 4,407*   HbA1C: No results for input(s): "HGBA1C" in the last 72 hours. CBG: No results for input(s): "GLUCAP" in the last 168 hours. Lipid Profile: No results for input(s): "CHOL", "HDL", "LDLCALC", "TRIG", "CHOLHDL", "LDLDIRECT" in the last 72 hours. Thyroid Function Tests: No results for input(s): "TSH", "T4TOTAL", "FREET4", "T3FREE", "THYROIDAB" in the last 72 hours. Anemia Panel: Recent Labs    08/21/23 0449  VITAMINB12 5,940*  FOLATE >40.0   Sepsis Labs: No results for input(s): "PROCALCITON", "LATICACIDVEN" in the last 168  hours.  No results found for this or any previous visit (from the past 240 hour(s)).       Radiology Studies: CT ANGIO GI BLEED  Result Date: 08/22/2023 CLINICAL DATA:  Lower GI bleed. EXAM: CTA ABDOMEN AND PELVIS WITHOUT AND WITH CONTRAST TECHNIQUE: Multidetector CT imaging of the abdomen and pelvis was performed using the standard protocol during bolus administration of intravenous contrast. Multiplanar reconstructed images and MIPs were obtained and reviewed to evaluate the vascular anatomy. RADIATION DOSE REDUCTION: This exam was performed according to the departmental dose-optimization program which includes automated exposure control, adjustment of the mA and/or kV according to patient size and/or use of iterative reconstruction technique. CONTRAST:  OMNIPAQUE  IOHEXOL 350 MG/ML SOLN COMPARISON:  05/17/2021, 02/20/2018, 05/27/2023. FINDINGS: VASCULAR Aorta: Normal caliber aorta without aneurysm, dissection, vasculitis or significant stenosis. Aortic atherosclerosis. Celiac: Patent without evidence of aneurysm, dissection, vasculitis. Atherosclerotic calcification at the ostia resulting in moderate stenosis and mild poststenotic dilatation. SMA: Patent without evidence of aneurysm, dissection, vasculitis or significant stenosis. Renals: Both renal arteries are patent without evidence of aneurysm, dissection, vasculitis, fibromuscular dysplasia. Atherosclerotic calcification of the renal ostia bilaterally with high-grade stenosis on the left and moderate stenosis on the right. IMA: Patent without evidence of aneurysm, dissection, vasculitis or significant stenosis. Inflow: Patent without evidence of aneurysm, dissection, vasculitis or significant stenosis. Proximal Outflow: Bilateral common femoral and visualized portions of the superficial and profunda femoral arteries are patent without evidence of aneurysm, dissection, vasculitis or significant stenosis. Veins: No obvious venous abnormality within the limitations of this arterial phase study. Review of the MIP images confirms the above findings. NON-VASCULAR Lower chest: Atelectasis or scarring is noted at the lung bases. There is a trace right pleural effusion. Pacemaker leads are noted in the heart. Hepatobiliary: No focal liver abnormality is seen. No gallstones, gallbladder wall thickening, or biliary dilatation. Pancreas: There is a cyst in the pancreatic tail measuring 1.5 cm, not significantly changed. A cyst is noted in the pancreatic body measuring 1 cm. And a cyst is seen in the pancreatic tail measuring 4 mm. No pancreatic ductal dilatation or surrounding inflammatory changes. Spleen: Normal in size without focal abnormality. Adrenals/Urinary Tract: The adrenal glands are within normal limits. Renal cysts are  noted bilaterally. The kidneys enhance symmetrically. No renal calculus or hydronephrosis bilaterally. The bladder is unremarkable. Stomach/Bowel: Stomach is within normal limits. Appendix is not seen. No evidence of bowel wall thickening, distention, or inflammatory changes. No free air or pneumatosis. Scattered diverticula are present along the colon without evidence of diverticulitis. No acute or active hemorrhage is seen. Lymphatic: No abdominal or pelvic lymphadenopathy. Reproductive: Status post hysterectomy. No adnexal masses. Other: Small amount of perihepatic free fluid. Musculoskeletal: Sclerosis is noted at the sacroiliac joints bilaterally. There are degenerative changes in the thoracolumbar spine. No acute osseous abnormality is seen. IMPRESSION: VASCULAR 1. No evidence of active hemorrhage. 2. Aortic atherosclerosis without aneurysm or dissection. 3. Atherosclerotic calcification of the renal ostia on the left resulting in high-grade stenosis. NON-VASCULAR 1. No evidence of acute or active hemorrhage. 2. Diverticulosis without diverticulitis. 3. Trace right pleural effusion with atelectasis at the lung bases. 4. Stable cyst in the pancreatic tail measuring 1.5 cm, unchanged from 2022. Additional new cysts are seen in the pancreatic body and tail. MRI/MRCP with pancreatic protocol are recommended for further evaluation. Electronically Signed   By: Thornell Sartorius M.D.   On: 08/22/2023 02:58        Scheduled Meds:  sodium  chloride   Intravenous Once   amLODipine  5 mg Oral Daily   pantoprazole (PROTONIX) IV  40 mg Intravenous Q12H   Continuous Infusions:  lactated ringers 50 mL/hr at 08/22/23 0654     LOS: 2 days    Time spent: 51 minutes spent on chart review, discussion with nursing staff, consultants, updating family and interview/physical exam; more than 50% of that time was spent in counseling and/or coordination of care.    Alvira Philips [place], DO [facility] Available via  Epic secure chat 7am-7pm After these hours, please refer to coverage provider listed on [email or web address] 08/22/2023, 10:58 AM

## 2023-08-22 NOTE — Progress Notes (Addendum)
Patient ID: Kaitlin Villarreal, female   DOB: 1926-02-15, 89 y.o.   MRN: [id number]     Attending physician's note   I have taken a history, reviewed the chart, and examined the patient. I performed a substantive portion of this encounter, including complete performance of at least one of the key components, in conjunction with the APP. I agree with the APP's note, impression, and recommendations with my edits.   No further overt bleeding.  Robust response to additional 1 unit RBC transfusion overnight with H/H 9.3/27.6 today.  CTA without any active bleeding, but did show scattered diverticulosis.  There were 3 pancreatic cysts incidentally noted as well.  Again, suspect self-limiting diverticular bleed.  Does not appear to be bleeding any longer and otherwise hemodynamically stable.  No plan to pursue colonoscopy in this 89 year old who is no longer actively bleeding.  - Serial CBCs - Advancing diet - Bedside commode, OOBTC, and ambulate around ward with assist - Largest cyst in the pancreatic body/tail appears largely unchanged from MRI in 2022.  Do not think continued surveillance imaging is necessary in this 89 year old would otherwise not be a surgical candidate - Inpatient GI service will remain available as needed  Doristine Locks, DO, FACG [phone] office          Progress Note   Subjective   Day # 2 CC; acute lower Gi bleed  CTA stat yesterday done many hours later - no active bleeding, scattered diverticulosis no evidence of diverticulitis, atherosclerotic calcification of the renal ostia on the left resulting in high-grade stenosis Stable cyst in the pancreatic tail measuring 1.5 cm additional new cyst in the pancreatic body and tail-sitter MRI  Hemoglobin was down to 6.5 steaming, post transfusions, she has received 2 additional blood transfusion x 2 and hemoglobin up to 10.4 today  She is not aware of any further bleeding today, anxious and a little distressed because  she cannot tell if she is bleeding, no complaints of abdominal pain, no nausea, she still been n.p.o. and is hungry.  A bit tearful   Objective   Vital signs in last 24 hours: Temp:  [97.6 F (36.4 C)-98.4 F (36.9 C)] 98.4 F (36.9 C) (11/21 0900) Pulse Rate:  [65-90] 65 (11/21 0900) Resp:  [16-20] 18 (11/21 0900) BP: (114-165)/(33-49) 142/49 (11/21 0935) SpO2:  [96 %-98 %] 96 % (11/21 0900) Last BM Date : 08/21/23 General: Very elderly white female in NAD Heart:  Regular rate and rhythm; no murmurs Lungs: Respirations even and unlabored, lungs CTA bilaterally Abdomen:  Soft, nontender and nondistended. Normal bowel sounds.  No blood or stool on her pad Extremities:  Without edema. Neurologic:  Alert and oriented,  grossly normal neurologically. Psych:  Cooperative. Normal mood and affect.  Intake/Output from previous day: 11/20 0701 - 11/21 0700 In: 957 [Blood:957] Out: -  Intake/Output this shift: No intake/output data recorded.  Lab Results: Recent Labs    08/20/23 2059 08/21/23 0449 08/21/23 1655 08/21/23 2220 08/22/23 0456 08/22/23 1011  WBC 6.1 5.6  --   --   --   --   HGB 9.1* 5.9*   < > 6.5* 9.3* 10.4*  HCT 29.0* 18.5*   < > 18.5* 27.6* 31.0*  PLT 247 190  --   --   --   --    < > = values in this interval not displayed.   BMET Recent Labs    08/20/23 2059 08/21/23 0813 08/22/23 0456  NA 143 144 141  K 4.4 4.6 4.6  CL 111 115* 113*  CO2 24 22 21*  GLUCOSE 101* 94 162*  BUN 36* 36* 47*  CREATININE 1.31* 1.44* 1.79*  CALCIUM 9.2 8.2* 7.8*   LFT Recent Labs    08/20/23 2059  PROT 7.1  ALBUMIN 3.7  AST 34  ALT 18  ALKPHOS 53  BILITOT 0.7   PT/INR Recent Labs    08/20/23 2115  LABPROT 12.6  INR 0.9    Studies/Results: CT ANGIO GI BLEED  Result Date: 08/22/2023 CLINICAL DATA:  Lower GI bleed. EXAM: CTA ABDOMEN AND PELVIS WITHOUT AND WITH CONTRAST TECHNIQUE: Multidetector CT imaging of the abdomen and pelvis was performed using the  standard protocol during bolus administration of intravenous contrast. Multiplanar reconstructed images and MIPs were obtained and reviewed to evaluate the vascular anatomy. RADIATION DOSE REDUCTION: This exam was performed according to the departmental dose-optimization program which includes automated exposure control, adjustment of the mA and/or kV according to patient size and/or use of iterative reconstruction technique. CONTRAST:  OMNIPAQUE IOHEXOL 350 MG/ML SOLN COMPARISON:  05/17/2021, 02/20/2018, 05/27/2023. FINDINGS: VASCULAR Aorta: Normal caliber aorta without aneurysm, dissection, vasculitis or significant stenosis. Aortic atherosclerosis. Celiac: Patent without evidence of aneurysm, dissection, vasculitis. Atherosclerotic calcification at the ostia resulting in moderate stenosis and mild poststenotic dilatation. SMA: Patent without evidence of aneurysm, dissection, vasculitis or significant stenosis. Renals: Both renal arteries are patent without evidence of aneurysm, dissection, vasculitis, fibromuscular dysplasia. Atherosclerotic calcification of the renal ostia bilaterally with high-grade stenosis on the left and moderate stenosis on the right. IMA: Patent without evidence of aneurysm, dissection, vasculitis or significant stenosis. Inflow: Patent without evidence of aneurysm, dissection, vasculitis or significant stenosis. Proximal Outflow: Bilateral common femoral and visualized portions of the superficial and profunda femoral arteries are patent without evidence of aneurysm, dissection, vasculitis or significant stenosis. Veins: No obvious venous abnormality within the limitations of this arterial phase study. Review of the MIP images confirms the above findings. NON-VASCULAR Lower chest: Atelectasis or scarring is noted at the lung bases. There is a trace right pleural effusion. Pacemaker leads are noted in the heart. Hepatobiliary: No focal liver abnormality is seen. No gallstones,  gallbladder wall thickening, or biliary dilatation. Pancreas: There is a cyst in the pancreatic tail measuring 1.5 cm, not significantly changed. A cyst is noted in the pancreatic body measuring 1 cm. And a cyst is seen in the pancreatic tail measuring 4 mm. No pancreatic ductal dilatation or surrounding inflammatory changes. Spleen: Normal in size without focal abnormality. Adrenals/Urinary Tract: The adrenal glands are within normal limits. Renal cysts are noted bilaterally. The kidneys enhance symmetrically. No renal calculus or hydronephrosis bilaterally. The bladder is unremarkable. Stomach/Bowel: Stomach is within normal limits. Appendix is not seen. No evidence of bowel wall thickening, distention, or inflammatory changes. No free air or pneumatosis. Scattered diverticula are present along the colon without evidence of diverticulitis. No acute or active hemorrhage is seen. Lymphatic: No abdominal or pelvic lymphadenopathy. Reproductive: Status post hysterectomy. No adnexal masses. Other: Small amount of perihepatic free fluid. Musculoskeletal: Sclerosis is noted at the sacroiliac joints bilaterally. There are degenerative changes in the thoracolumbar spine. No acute osseous abnormality is seen. IMPRESSION: VASCULAR 1. No evidence of active hemorrhage. 2. Aortic atherosclerosis without aneurysm or dissection. 3. Atherosclerotic calcification of the renal ostia on the left resulting in high-grade stenosis. NON-VASCULAR 1. No evidence of acute or active hemorrhage. 2. Diverticulosis without diverticulitis. 3. Trace right pleural effusion with atelectasis at the lung bases.  4. Stable cyst in the pancreatic tail measuring 1.5 cm, unchanged from 2022. Additional new cysts are seen in the pancreatic body and tail. MRI/MRCP with pancreatic protocol are recommended for further evaluation. Electronically Signed   By: Thornell Sartorius M.D.   On: 08/22/2023 02:58       Assessment / Plan:    #55 89 year old white female  with acute lower GI bleed with significant drop in hemoglobin. She has required 3 units of packed RBC since admission, and hemoglobin is up to 10.4 today  CTA ordered stat yesterday but done many hours later and negative for active bleeding she does have scattered diverticulosis is and this is very likely the etiology of her bleed which appears to be resolving.  #2 anemia acute secondary to above #3 chronic kidney disease stage III-note patient has calcification of the renal ostia bilaterally with high-grade stenosis on the left moderate on the right  #4 small pancreatic cysts CTA noted some additional small cysts compared to prior imaging-RI done elsewhere in 2022 recommended follow-up MRI in 2 years  Plan; full liquid diet Serial hemoglobins and transfuse as indicated Up to bedside commode or chair with help Could consider MRI/MRCP, however at age 81 not sure that that would change management in the small pancreatic cysts-discussed further with patient.    Principal Problem:   GI bleed Active Problems:   Essential hypertension   VSD (ventricular septal defect)   CKD (chronic kidney disease) stage 3, GFR 30-59 ml/min (HCC)   HLD (hyperlipidemia)   Chronic diastolic (congestive) heart failure (HCC)   Stage 3 chronic kidney disease due to benign hypertension (HCC)   Hematochezia   ABLA (acute blood loss anemia)     LOS: 2 days   Amy Esterwood PA-C 08/22/2023, 12:49 PM

## 2023-08-22 NOTE — Plan of Care (Signed)

## 2023-08-22 NOTE — Plan of Care (Signed)
  Problem: Clinical Measurements: Goal: Will remain free from infection Outcome: Progressing Goal: Respiratory complications will improve Outcome: Progressing Goal: Cardiovascular complication will be avoided Outcome: Progressing   Problem: Activity: Goal: Risk for activity intolerance will decrease Outcome: Progressing   Problem: Nutrition: Goal: Adequate nutrition will be maintained Outcome: Progressing   Problem: Coping: Goal: Level of anxiety will decrease Outcome: Progressing   Problem: Elimination: Goal: Will not experience complications related to bowel motility Outcome: Progressing Goal: Will not experience complications related to urinary retention Outcome: Progressing   Problem: Pain Management: Goal: General experience of comfort will improve Outcome: Progressing   Problem: Safety: Goal: Ability to remain free from injury will improve Outcome: Progressing

## 2023-08-22 NOTE — Progress Notes (Signed)
Central tele called to make RN aware Wide QRS/ 26 beats. Pt denies any CP shop or distress.

## 2023-08-23 DIAGNOSIS — K5731 Diverticulosis of large intestine without perforation or abscess with bleeding: Secondary | ICD-10-CM | POA: Diagnosis not present

## 2023-08-23 DIAGNOSIS — D62 Acute posthemorrhagic anemia: Secondary | ICD-10-CM | POA: Diagnosis not present

## 2023-08-23 DIAGNOSIS — K922 Gastrointestinal hemorrhage, unspecified: Secondary | ICD-10-CM | POA: Diagnosis not present

## 2023-08-23 LAB — CBC
HCT: 25.1 % — ABNORMAL LOW (ref 36.0–46.0)
Hemoglobin: 8.1 g/dL — ABNORMAL LOW (ref 12.0–15.0)
MCH: 29.5 pg (ref 26.0–34.0)
MCHC: 32.3 g/dL (ref 30.0–36.0)
MCV: 91.3 fL (ref 80.0–100.0)
Platelets: 160 10*3/uL (ref 150–400)
RBC: 2.75 MIL/uL — ABNORMAL LOW (ref 3.87–5.11)
RDW: 16.4 % — ABNORMAL HIGH (ref 11.5–15.5)
WBC: 10.5 10*3/uL (ref 4.0–10.5)
nRBC: 0 % (ref 0.0–0.2)

## 2023-08-23 LAB — BASIC METABOLIC PANEL WITH GFR
Anion gap: 4 — ABNORMAL LOW (ref 5–15)
BUN: 51 mg/dL — ABNORMAL HIGH (ref 8–23)
CO2: 23 mmol/L (ref 22–32)
Calcium: 7.8 mg/dL — ABNORMAL LOW (ref 8.9–10.3)
Chloride: 113 mmol/L — ABNORMAL HIGH (ref 98–111)
Creatinine, Ser: 1.52 mg/dL — ABNORMAL HIGH (ref 0.44–1.00)
GFR, Estimated: 31 mL/min — ABNORMAL LOW
Glucose, Bld: 97 mg/dL (ref 70–99)
Potassium: 4.5 mmol/L (ref 3.5–5.1)
Sodium: 140 mmol/L (ref 135–145)

## 2023-08-23 LAB — HEMOGLOBIN AND HEMATOCRIT, BLOOD
HCT: 26.6 % — ABNORMAL LOW (ref 36.0–46.0)
HCT: 28.9 % — ABNORMAL LOW (ref 36.0–46.0)
Hemoglobin: 8.7 g/dL — ABNORMAL LOW (ref 12.0–15.0)
Hemoglobin: 9.7 g/dL — ABNORMAL LOW (ref 12.0–15.0)

## 2023-08-23 MED ORDER — PANTOPRAZOLE SODIUM 40 MG PO TBEC
40.0000 mg | DELAYED_RELEASE_TABLET | Freq: Every day | ORAL | Status: DC
Start: 2023-08-23 — End: 2023-08-26
  Administered 2023-08-23 – 2023-08-26 (×4): 40 mg via ORAL
  Filled 2023-08-23 (×4): qty 1

## 2023-08-23 NOTE — Evaluation (Signed)
Physical Therapy Evaluation  Patient Details Name: Kaitlin Villarreal MRN: [id number] DOB: [birth year] Today's Date: 08/23/2023  History of Present Illness  Pt is a 89 y/o female who presents 08/20/2023 with rectal bleeding. PMH significant for acute on chronic dHF, aortic stenosis, renal mass, CHF, CKD III, HTN, pacemaker, pancreatic mass, TAVR, TIA.   Clinical Impression  Pt admitted with above diagnosis. Pt currently with functional limitations due to the deficits listed below (see PT Problem List). At the time of PT eval pt was able to perform transfers and ambulation with up to min assist and RW for support. Pt appears to be near baseline of function but reports good support system between daughter and church members. Acutely, pt will benefit from acute skilled PT to increase their independence and safety with mobility to allow discharge.           If plan is discharge home, recommend the following: A little help with walking and/or transfers;A little help with bathing/dressing/bathroom;Assistance with cooking/housework;Assist for transportation;Help with stairs or ramp for entrance   Can travel by private vehicle        Equipment Recommendations None recommended by PT  Recommendations for Other Services       Functional Status Assessment Patient has had a recent decline in their functional status and demonstrates the ability to make significant improvements in function in a reasonable and predictable amount of time.     Precautions / Restrictions Precautions Precautions: Fall Restrictions Weight Bearing Restrictions: No      Mobility  Bed Mobility Overal bed mobility: Needs Assistance Bed Mobility: Supine to Sit     Supine to sit: Min assist     General bed mobility comments: Light assist to transition to EOB.    Transfers Overall transfer level: Needs assistance Equipment used: Rolling walker (2 wheels) Transfers: Sit to/from Stand Sit to Stand: Min assist            General transfer comment: Light min assist and cueing for hand placement on seated surface for safety.    Ambulation/Gait Ambulation/Gait assistance: Contact guard assist, Min assist Gait Distance (Feet): 100 Feet Assistive device: Rolling walker (2 wheels) Gait Pattern/deviations: Step-through pattern, Decreased stride length, Trunk flexed Gait velocity: Decreased Gait velocity interpretation: 1.31 - 2.62 ft/sec, indicative of limited community ambulator   General Gait Details: Slow but generally steady with RW for support. Pt required occasional assist for walker management and way finding in the hallway. Once out of the room, pt very disoriented to direction and how to get back to her room.  Stairs            Wheelchair Mobility     Tilt Bed    Modified Rankin (Stroke Patients Only)       Balance Overall balance assessment: Needs assistance Sitting-balance support: No upper extremity supported, Feet supported Sitting balance-Leahy Scale: Fair     Standing balance support: Bilateral upper extremity supported, During functional activity, No upper extremity supported Standing balance-Leahy Scale: Fair                               Pertinent Vitals/Pain Pain Assessment Pain Assessment: No/denies pain    Home Living Family/patient expects to be discharged to:: Private residence Living Arrangements: Alone Available Help at Discharge: Family;Friend(s);Available 24 hours/day (if needed) Type of Home: House Home Access: Stairs to enter;Ramped entrance       Home Layout: Two level;Able to live on  main level with bedroom/bathroom Home Equipment: Rolling Walker (2 wheels);Rollator (4 wheels);Grab bars - tub/shower      Prior Function Prior Level of Function : Independent/Modified Independent             Mobility Comments: Using walkers some times ADLs Comments: Daughter assists with med management     Extremity/Trunk Assessment   Upper  Extremity Assessment Upper Extremity Assessment: Generalized weakness    Lower Extremity Assessment Lower Extremity Assessment: Generalized weakness (Mild; age appropriate)    Cervical / Trunk Assessment Cervical / Trunk Assessment: Kyphotic Cervical / Trunk Exceptions: Forward head posture with rounded shoulders  Communication   Communication Communication: No apparent difficulties Cueing Techniques: Verbal cues;Gestural cues  Cognition Arousal: Alert Behavior During Therapy: WFL for tasks assessed/performed Overall Cognitive Status: History of cognitive impairments - at baseline                                 General Comments: per chart hx of cognitive impairment.  Pt oriented and follows commands well.  She requires intermittent cueing for sfaety and recalling use of RW.        General Comments      Exercises     Assessment/Plan    PT Assessment Patient needs continued PT services  PT Problem List Decreased strength;Decreased activity tolerance;Decreased balance;Decreased mobility;Decreased safety awareness;Decreased knowledge of use of DME;Decreased knowledge of precautions       PT Treatment Interventions DME instruction;Stair training;Gait training;Functional mobility training;Therapeutic activities;Therapeutic exercise;Balance training;Patient/family education    PT Goals (Current goals can be found in the Care Plan section)  Acute Rehab PT Goals Patient Stated Goal: Be able to return to her home. PT Goal Formulation: With patient Time For Goal Achievement: 08/30/23 Potential to Achieve Goals: Good    Frequency Min 1X/week     Co-evaluation               AM-PAC PT "6 Clicks" Mobility  Outcome Measure Help needed turning from your back to your side while in a flat bed without using bedrails?: A Little Help needed moving from lying on your back to sitting on the side of a flat bed without using bedrails?: A Little Help needed moving to  and from a bed to a chair (including a wheelchair)?: A Little Help needed standing up from a chair using your arms (e.g., wheelchair or bedside chair)?: A Little Help needed to walk in hospital room?: A Little Help needed climbing 3-5 steps with a railing? : A Little 6 Click Score: 18    End of Session Equipment Utilized During Treatment: Gait belt Activity Tolerance: Patient tolerated treatment well Patient left: in bed;with call bell/phone within reach;with family/visitor present Nurse Communication: Mobility status PT Visit Diagnosis: Unsteadiness on feet (R26.81);Difficulty in walking, not elsewhere classified (R26.2)    Time: 6962-9528 PT Time Calculation (min) (ACUTE ONLY): 28 min   Charges:   PT Evaluation $PT Eval Moderate Complexity: 1 Mod PT Treatments $Gait Training: 8-22 mins PT General Charges $$ ACUTE PT VISIT: 1 Visit         Conni Slipper, PT, DPT Acute Rehabilitation Services Secure Chat Preferred Office: [phone]   Marylynn Pearson 08/23/2023, 2:19 PM

## 2023-08-23 NOTE — Evaluation (Signed)
Occupational Therapy Evaluation Patient Details Name: Kaitlin Villarreal MRN: [id number] DOB: 1925/10/09 Today's Date: 08/23/2023   History of Present Illness Pt is a 89 y/o female who presents 08/20/2023 with rectal bleeding. PMH significant for acute on chronic dHF, aortic stenosis, renal mass, CHF, CKD III, HTN, pacemaker, pancreatic mass, TAVR, TIA.   Clinical Impression   Patient admitted for above and presents with problem list below.  PTA she is independent with ADLs, requires assist for IADLs and completes mobility using RW at times. Today she requires light min assist to min guard assist for bed mobility, transfers and functional mobility; up to min assist for ADLs.  She is oriented, follows simple commands, but demonstrates decreased problem solving, recall and safety at times; known cognitive impairment at baseline.  Based on performance today, recommend continued OT service acutely to optimize independence and safety but anticipate no further needs after dc home.  Do recommend initial 24/7 support at dc for safety. Will follow acutely.       If plan is discharge home, recommend the following: A little help with walking and/or transfers;A little help with bathing/dressing/bathroom;Assistance with cooking/housework;Assist for transportation;Help with stairs or ramp for entrance;Direct supervision/assist for financial management;Direct supervision/assist for medications management    Functional Status Assessment  Patient has had a recent decline in their functional status and demonstrates the ability to make significant improvements in function in a reasonable and predictable amount of time.  Equipment Recommendations  Tub/shower seat    Recommendations for Other Services       Precautions / Restrictions Precautions Precautions: Fall Restrictions Weight Bearing Restrictions: No      Mobility Bed Mobility Overal bed mobility: Needs Assistance Bed Mobility: Supine to Sit      Supine to sit: Min assist     General bed mobility comments: Light assist to transition to EOB.    Transfers Overall transfer level: Needs assistance Equipment used: Rolling walker (2 wheels) Transfers: Sit to/from Stand Sit to Stand: Min assist           General transfer comment: light min assist and cueing for hand placement      Balance Overall balance assessment: Needs assistance Sitting-balance support: No upper extremity supported, Feet supported Sitting balance-Leahy Scale: Fair     Standing balance support: Bilateral upper extremity supported, During functional activity, No upper extremity supported Standing balance-Leahy Scale: Fair                             ADL either performed or assessed with clinical judgement   ADL Overall ADL's : Needs assistance/impaired     Grooming: Contact guard assist;Standing           Upper Body Dressing : Set up;Sitting   Lower Body Dressing: Sit to/from stand;Minimal assistance   Toilet Transfer: Ambulation;Rolling walker (2 wheels);Minimal assistance Toilet Transfer Details (indicate cue type and reason): cueing for safety with RW Toileting- Clothing Manipulation and Hygiene: Contact guard assist;Supervision/safety;Sitting/lateral lean;Sit to/from stand       Functional mobility during ADLs: Contact guard assist;Rolling walker (2 wheels);Cueing for safety       Vision   Vision Assessment?: No apparent visual deficits Additional Comments: not formally tested     Perception         Praxis         Pertinent Vitals/Pain Pain Assessment Pain Assessment: No/denies pain     Extremity/Trunk Assessment Upper Extremity Assessment Upper Extremity Assessment: Generalized  weakness   Lower Extremity Assessment Lower Extremity Assessment: Defer to PT evaluation   Cervical / Trunk Assessment Cervical / Trunk Assessment: Other exceptions Cervical / Trunk Exceptions: Forward head posture with  rounded shoulders   Communication Communication Communication: No apparent difficulties Cueing Techniques: Verbal cues;Gestural cues   Cognition Arousal: Alert Behavior During Therapy: WFL for tasks assessed/performed Overall Cognitive Status: History of cognitive impairments - at baseline                                 General Comments: per chart hx of cognitive impairment.  Pt oriented and follows commands well.  She requires intermittent cueing for sfaety and recalling use of RW.     General Comments       Exercises     Shoulder Instructions      Home Living Family/patient expects to be discharged to:: Private residence Living Arrangements: Alone Available Help at Discharge: Family;Friend(s);Available 24 hours/day (if needed) Type of Home: House Home Access: Stairs to enter;Ramped entrance     Home Layout: Two level;Able to live on main level with bedroom/bathroom     Bathroom Shower/Tub: Tub/shower unit;Walk-in shower (walk in shower upstairs, tub shower downstairs)   Bathroom Toilet: Standard Bathroom Accessibility: Yes   Home Equipment: Agricultural consultant (2 wheels);Rollator (4 wheels);Grab bars - tub/shower          Prior Functioning/Environment Prior Level of Function : Independent/Modified Independent             Mobility Comments: Using walkers some times ADLs Comments: Daughter assists with med management        OT Problem List: Decreased strength;Decreased activity tolerance;Impaired balance (sitting and/or standing);Decreased knowledge of precautions;Decreased knowledge of use of DME or AE      OT Treatment/Interventions: Self-care/ADL training;Therapeutic exercise;DME and/or AE instruction;Therapeutic activities;Patient/family education;Balance training    OT Goals(Current goals can be found in the care plan section) Acute Rehab OT Goals Patient Stated Goal: home OT Goal Formulation: With patient Time For Goal Achievement:  09/06/23 Potential to Achieve Goals: Good  OT Frequency: Min 1X/week    Co-evaluation              AM-PAC OT "6 Clicks" Daily Activity     Outcome Measure Help from another person eating meals?: A Little Help from another person taking care of personal grooming?: A Little Help from another person toileting, which includes using toliet, bedpan, or urinal?: A Little Help from another person bathing (including washing, rinsing, drying)?: A Little Help from another person to put on and taking off regular upper body clothing?: A Little Help from another person to put on and taking off regular lower body clothing?: A Little 6 Click Score: 18   End of Session Equipment Utilized During Treatment: Gait belt;Rolling walker (2 wheels) Nurse Communication: Mobility status  Activity Tolerance: Patient tolerated treatment well Patient left: in chair;with call bell/phone within reach;with chair alarm set  OT Visit Diagnosis: Other abnormalities of gait and mobility (R26.89);Muscle weakness (generalized) (M62.81)                Time: 7829-5621 OT Time Calculation (min): 19 min Charges:  OT General Charges $OT Visit: 1 Visit OT Evaluation $OT Eval Moderate Complexity: 1 Mod  Barry Brunner, OT Acute Rehabilitation Services Office [phone]   Chancy Milroy 08/23/2023, 11:42 AM

## 2023-08-23 NOTE — Plan of Care (Signed)

## 2023-08-23 NOTE — Care Management Important Message (Signed)
Important Message  Patient Details  Name: Kaitlin Villarreal MRN: [id number] Date of Birth: 10-07-1925   Important Message Given:  Yes - Medicare IM     Sherilyn Banker 08/23/2023, 3:46 PM

## 2023-08-23 NOTE — TOC Initial Note (Addendum)
Transition of Care [name]) - Initial/Assessment Note    Patient Details  Name: Kaitlin Villarreal MRN: [id number] Date of Birth: 08/18/1926  [name]) CM/SW Contact:    [name], RN Phone Number: 08/23/2023, 3:41 PM  Clinical Narrative:                  Kaitlin Villarreal w patient's daughter Kaitlin Villarreal over the phone.  She would like HH services, Patient used [name] earlier this year and Kaitlin Villarreal would like referral sent there. I talked with [name] who will review the referral and she was unclear if she would get back to me today or not.  Kaitlin Villarreal states that the patient has all needed DME at home   UPDATE [facility] unable to accept for [facility] services.  [name] accepted referral   Expected Discharge Plan: Home w Home Health Services Barriers to Discharge: Continued Medical Work up   Patient Goals and CMS Choice Patient states their goals for this hospitalization and ongoing recovery are:: to go home CMS Medicare.gov Compare Post Acute Care list provided to:: Other (Comment Required) Choice offered to / list presented to : Adult Children      Expected Discharge Plan and Services   Discharge Planning Services: CM Consult Post Acute Care Choice: Home Health Living arrangements for the past 2 months: Single Family Home                           HH Arranged: PT, OT HH Agency: [name] Agency Contacted: 08/23/23 Time HH Agency Contacted: [phone] Representative spoke with at [facility] Agency: [name]  Prior Living Arrangements/Services Living arrangements for the past 2 months: Single Family Home                Current home services: DME    Activities of Daily Living   ADL Screening (condition at time of admission) Independently performs ADLs?: Yes (appropriate for developmental age) Is the patient deaf or have difficulty hearing?: No Does the patient have difficulty seeing, even when wearing glasses/contacts?: No Does the patient have difficulty  concentrating, remembering, or making decisions?: No  Permission Sought/Granted                  Emotional Assessment              Admission diagnosis:  GI bleed [K92.2] Acute GI bleeding [K92.2] Patient Active Problem List   Diagnosis Date Noted   Diverticulosis of colon with hemorrhage 08/22/2023   Hematochezia 08/21/2023   ABLA (acute blood loss anemia) 08/21/2023   GI bleed 08/20/2023   Anemia in chronic kidney disease 11/08/2022   Foot pain, bilateral 10/18/2021   Elevated brain natriuretic peptide (BNP) level 04/10/2021   Dizziness 04/10/2021   Hypertensive encephalopathy 04/10/2021   Open wound 04/10/2021   Stage 3 chronic kidney disease due to benign hypertension (HCC) 04/10/2021   Osteoarthritis 04/10/2021   Hypertension 04/10/2021   Chronic renal insufficiency 04/10/2021   Abnormal CT scan, gallbladder 09/08/2020   TIA (transient ischemic attack)    Plantar fat pad atrophy of left foot    HLD (hyperlipidemia)    GERD (gastroesophageal reflux disease)    Chronic diastolic (congestive) heart failure (HCC)    Bone spur of toe of left foot    Arthritis    Pacemaker 09/02/2018   Pancreatic lesion 04/17/2018   VSD (ventricular septal defect)    Severe aortic stenosis 03/18/2018   Chronic  diastolic heart failure (HCC) 03/18/2018   S/P TAVR (transcatheter aortic valve replacement) 03/18/2018   Acute on chronic diastolic heart failure (HCC) 03/18/2018   Pancreatic mass    CKD (chronic kidney disease) stage 3, GFR 30-59 ml/min (HCC) 07/26/2017   Bilateral renal masses 07/26/2017   Stage 3 chronic kidney disease (HCC) 11/27/2016   Essential hypertension 11/25/2016   Hyperlipidemia 11/25/2016   Aortic stenosis 10/14/2014   PCP:  Annamaria Helling, DO Pharmacy:   [facility] [phone] - [place], [place] - [address] AT [address] [place] [address] Phone: [phone] Fax: [phone]     Social Determinants of Health  (SDOH) Social History: SDOH Screenings   Food Insecurity: No Food Insecurity (08/21/2023)  Housing: Low Risk  (08/21/2023)  Transportation Needs: No Transportation Needs (08/21/2023)  Utilities: Not At Risk (08/21/2023)  Depression (PHQ2-9): Low Risk  (11/08/2022)  Tobacco Use: Low Risk  (06/18/2023)   SDOH Interventions:     Readmission Risk Interventions     No data to display

## 2023-08-23 NOTE — Progress Notes (Signed)
PROGRESS NOTE    Kaitlin Villarreal  KGM:[id number] DOB: 1926-05-26 DOA: 08/20/2023 PCP: Annamaria Helling, DO    Brief Narrative:   Kaitlin Villarreal is a 89 y.o. female with past medical history significant for chronic diastolic congestive heart failure, HTN, HLD, history of severe aortic stenosis s/p TAVR, complete heart block s/p PPM, CKD stage IIIb cognitive impairment who presented to [facility] ED on 11/19 from home via EMS due to rectal bleeding.  Patient reports while going to the restroom, had bowel movement with commode filled with bright red blood.  Utilizes aspirin 81 mg p.o. daily but no other anticoagulants outpatient.  Denies abdominal pain, no previous history of GI bleed.  Also distant/remote history of colonoscopy.  In the ED, temperature 98.9 F, HR 79, RR 25, BP 173/54, SpO2 96% on room air.  WBC 6.1, hemoglobin 9.1, platelet count 247.  Sodium 143, potassium 4.4, chloride 111, CO2 24, glucose 101, BUN 36, creatinine 1.31.  AST 34, ALT 18, total bilirubin 0.7.  INR 0.9.  FOBT positive.  EDP consulted gastroenterology, Dr. Leone Payor.  TRH consulted for admission for further evaluation and management of acute lower GI bleed.  Assessment & Plan:   Lower GI bleed, acute Patient presenting to ED with bright red blood per rectum.  Hemoglobin 9.1 on admission.  FOBT positive.  On aspirin 81 mg p.o. daily outpatient, no other anticoagulant/antiplatelet.  No previous history of GI bleed.  CT angiogram GI bleed study with no evidence of active hemorrhage. -- [facility] following, appreciate assistance -- Hgb 9.1>5.9>8.9>6.5>9.3>10.4>9.9>8.1 -- Transfused 3 unit PRBCs  -- Holding home aspirin -- H&H every 12 hours; continue to transfuse for active bleeding/hemoglobin less than 7.0 -- Advance to soft diet today  Chronic diastolic congestive heart failure Essential hypertension Home regimen includes metoprolol tartrate 50 mg p.o. twice daily, amlodipine 5 mg p.o. daily, lisinopril 40 mg p.o. daily,  furosemide 20 mg on Monday/Wednesday/Friday, hydralazine as needed. -- Amlodipine 5 mg PO daily -- Hold home furosemide/lisinopril -- Hydralazine 10mg  PO q6h PRN SBP >170 or DBP >110   History of severe aortic stenosis s/p TAVR TTE 03/2022 with LVEF 70%, normal functioning of her bioprosthetic valve. -- Outpatient follow-up with cardiology  CKD stage IIIb Baseline creatinine 1.4-1.5 over the past 1 year. -- Cr 1.31>1.44>1.79>1.52 -- Hold home lisinopril for now -- Encourage increased oral intake -- BMP daily  Pancreatic cysts CT angiogram with stable tail pancreatic cyst measuring 1.5 cm, additional new cyst pancreatic body.  Outpatient follow-up with GI, given advanced age, unlikely surgical candidate.  Cognitive impairment -- Delirium precautions -- Get up during the day -- Encourage a familiar face to remain present throughout the day -- Keep blinds open and lights on during daylight hours -- Minimize the use of opioids/benzodiazepines  Weakness/debility/deconditioning: -- OT: No recommendations -- PT evaluation: Pending   DVT prophylaxis: SCDs Start: 08/20/23 2359    Code Status: Limited: Do not attempt resuscitation (DNR) -DNR-LIMITED -Do Not Intubate/DNI  Family Communication: Attempted to update patient's daughter Chip Boer via telephone this morning, no answer and no voicebox available  Disposition Plan:  Level of care: Telemetry Medical Status is: Inpatient Remains inpatient appropriate because: Advancing diet, need to ensure stability of hemoglobin, anticipate likely discharge home tomorrow, awaiting PT evaluation.    Consultants:  [facility]  Procedures:  None  Antimicrobials:  None   Subjective: Patient seen examined bedside, resting comfortably in bed.  Reports no further blood in her stool; although has not had  much over the last 24 hours.  Tolerating diet.  Feels hungry would like further advancement of diet.  Hemoglobin down to 8.1 this  morning, but seems to be stabilizing.  Denies any dizziness on standing.  No family present at bedside this morning.  Attempted to update patient's daughter Chip Boer via telephone, unsuccessful with no voicemail available..  Denies headache, no dizziness, no chest pain, no palpitations, no shortness of breath, no abdominal pain, no fever/chills/night sweats, no nausea/vomiting/diarrhea, no focal weakness, no fatigue, no paresthesias.  No other acute events overnight per nursing staff.  Objective: Vitals:   08/23/23 0000 08/23/23 0358 08/23/23 0829 08/23/23 1042  BP: (!) 122/39 (!) 167/52 (!) 135/34 (!) 147/34  Pulse: 65  (!) 43   Resp: 18 18 19    Temp: 98.4 F (36.9 C) 98.4 F (36.9 C)    TempSrc: Oral Oral    SpO2: 97%  95%     Intake/Output Summary (Last 24 hours) at 08/23/2023 1238 Last data filed at 08/23/2023 0400 Gross per 24 hour  Intake 120 ml  Output 700 ml  Net -580 ml   There were no vitals filed for this visit.  Examination:  Physical Exam: GEN: NAD, alert and oriented x 3, elderly appearance HEENT: NCAT, PERRL, EOMI, sclera clear, MMM PULM: CTAB w/o wheezes/crackles, normal respiratory effort, on room air CV: RRR w/o M/G/R GI: abd soft, NTND, NABS, no R/G/M MSK: no peripheral edema, muscle strength globally intact 5/5 bilateral upper/lower extremities NEURO: CN II-XII intact, no focal deficits, sensation to light touch intact PSYCH: normal mood/affect Integumentary: No concerning rashes/lesions/wounds noted on exposed skin surfaces    Data Reviewed: I have personally reviewed following labs and imaging studies  CBC: Recent Labs  Lab 08/20/23 2059 08/21/23 0449 08/21/23 1655 08/21/23 2220 08/22/23 0456 08/22/23 1011 08/22/23 1622 08/23/23 0733  WBC 6.1 5.6  --   --   --   --   --  10.5  NEUTROABS 4.0  --   --   --   --   --   --   --   HGB 9.1* 5.9*   < > 6.5* 9.3* 10.4* 9.9* 8.1*  HCT 29.0* 18.5*   < > 18.5* 27.6* 31.0* 29.0* 25.1*  MCV 101.4* 100.0   --   --   --   --   --  91.3  PLT 247 190  --   --   --   --   --  160   < > = values in this interval not displayed.   Basic Metabolic Panel: Recent Labs  Lab 08/20/23 2059 08/21/23 0813 08/22/23 0456 08/23/23 0733  NA 143 144 141 140  K 4.4 4.6 4.6 4.5  CL 111 115* 113* 113*  CO2 24 22 21* 23  GLUCOSE 101* 94 162* 97  BUN 36* 36* 47* 51*  CREATININE 1.31* 1.44* 1.79* 1.52*  CALCIUM 9.2 8.2* 7.8* 7.8*   GFR: CrCl cannot be calculated (Unknown ideal weight.). Liver Function Tests: Recent Labs  Lab 08/20/23 2059  AST 34  ALT 18  ALKPHOS 53  BILITOT 0.7  PROT 7.1  ALBUMIN 3.7   No results for input(s): "LIPASE", "AMYLASE" in the last 168 hours. No results for input(s): "AMMONIA" in the last 168 hours. Coagulation Profile: Recent Labs  Lab 08/20/23 2115  INR 0.9   Cardiac Enzymes: No results for input(s): "CKTOTAL", "CKMB", "CKMBINDEX", "TROPONINI" in the last 168 hours. BNP (last 3 results) Recent Labs    12/10/22 1146  PROBNP 4,407*   HbA1C: No results for input(s): "HGBA1C" in the last 72 hours. CBG: No results for input(s): "GLUCAP" in the last 168 hours. Lipid Profile: No results for input(s): "CHOL", "HDL", "LDLCALC", "TRIG", "CHOLHDL", "LDLDIRECT" in the last 72 hours. Thyroid Function Tests: No results for input(s): "TSH", "T4TOTAL", "FREET4", "T3FREE", "THYROIDAB" in the last 72 hours. Anemia Panel: Recent Labs    08/21/23 0449  VITAMINB12 5,940*  FOLATE >40.0   Sepsis Labs: No results for input(s): "PROCALCITON", "LATICACIDVEN" in the last 168 hours.  No results found for this or any previous visit (from the past 240 hour(s)).       Radiology Studies: CT ANGIO GI BLEED  Result Date: 08/22/2023 CLINICAL DATA:  Lower GI bleed. EXAM: CTA ABDOMEN AND PELVIS WITHOUT AND WITH CONTRAST TECHNIQUE: Multidetector CT imaging of the abdomen and pelvis was performed using the standard protocol during bolus administration of intravenous contrast.  Multiplanar reconstructed images and MIPs were obtained and reviewed to evaluate the vascular anatomy. RADIATION DOSE REDUCTION: This exam was performed according to the departmental dose-optimization program which includes automated exposure control, adjustment of the mA and/or kV according to patient size and/or use of iterative reconstruction technique. CONTRAST:  OMNIPAQUE IOHEXOL 350 MG/ML SOLN COMPARISON:  05/17/2021, 02/20/2018, 05/27/2023. FINDINGS: VASCULAR Aorta: Normal caliber aorta without aneurysm, dissection, vasculitis or significant stenosis. Aortic atherosclerosis. Celiac: Patent without evidence of aneurysm, dissection, vasculitis. Atherosclerotic calcification at the ostia resulting in moderate stenosis and mild poststenotic dilatation. SMA: Patent without evidence of aneurysm, dissection, vasculitis or significant stenosis. Renals: Both renal arteries are patent without evidence of aneurysm, dissection, vasculitis, fibromuscular dysplasia. Atherosclerotic calcification of the renal ostia bilaterally with high-grade stenosis on the left and moderate stenosis on the right. IMA: Patent without evidence of aneurysm, dissection, vasculitis or significant stenosis. Inflow: Patent without evidence of aneurysm, dissection, vasculitis or significant stenosis. Proximal Outflow: Bilateral common femoral and visualized portions of the superficial and profunda femoral arteries are patent without evidence of aneurysm, dissection, vasculitis or significant stenosis. Veins: No obvious venous abnormality within the limitations of this arterial phase study. Review of the MIP images confirms the above findings. NON-VASCULAR Lower chest: Atelectasis or scarring is noted at the lung bases. There is a trace right pleural effusion. Pacemaker leads are noted in the heart. Hepatobiliary: No focal liver abnormality is seen. No gallstones, gallbladder wall thickening, or biliary dilatation. Pancreas: There is a cyst  in the pancreatic tail measuring 1.5 cm, not significantly changed. A cyst is noted in the pancreatic body measuring 1 cm. And a cyst is seen in the pancreatic tail measuring 4 mm. No pancreatic ductal dilatation or surrounding inflammatory changes. Spleen: Normal in size without focal abnormality. Adrenals/Urinary Tract: The adrenal glands are within normal limits. Renal cysts are noted bilaterally. The kidneys enhance symmetrically. No renal calculus or hydronephrosis bilaterally. The bladder is unremarkable. Stomach/Bowel: Stomach is within normal limits. Appendix is not seen. No evidence of bowel wall thickening, distention, or inflammatory changes. No free air or pneumatosis. Scattered diverticula are present along the colon without evidence of diverticulitis. No acute or active hemorrhage is seen. Lymphatic: No abdominal or pelvic lymphadenopathy. Reproductive: Status post hysterectomy. No adnexal masses. Other: Small amount of perihepatic free fluid. Musculoskeletal: Sclerosis is noted at the sacroiliac joints bilaterally. There are degenerative changes in the thoracolumbar spine. No acute osseous abnormality is seen. IMPRESSION: VASCULAR 1. No evidence of active hemorrhage. 2. Aortic atherosclerosis without aneurysm or dissection. 3. Atherosclerotic calcification of the renal ostia  on the left resulting in high-grade stenosis. NON-VASCULAR 1. No evidence of acute or active hemorrhage. 2. Diverticulosis without diverticulitis. 3. Trace right pleural effusion with atelectasis at the lung bases. 4. Stable cyst in the pancreatic tail measuring 1.5 cm, unchanged from 2022. Additional new cysts are seen in the pancreatic body and tail. MRI/MRCP with pancreatic protocol are recommended for further evaluation. Electronically Signed   By: Thornell Sartorius M.D.   On: 08/22/2023 02:58        Scheduled Meds:  sodium chloride   Intravenous Once   amLODipine  5 mg Oral Daily   pantoprazole  40 mg Oral Daily    Continuous Infusions:     LOS: 3 days    Time spent: 51 minutes spent on chart review, discussion with nursing staff, consultants, updating family and interview/physical exam; more than 50% of that time was spent in counseling and/or coordination of care.    Alvira Philips [place], DO [facility] Available via Epic secure chat 7am-7pm After these hours, please refer to coverage provider listed on [email or web address] 08/23/2023, 12:38 PM

## 2023-08-23 NOTE — Plan of Care (Signed)
Problem: Education: Goal: Knowledge of General Education information will improve Description Including pain rating scale, medication(s)/side effects and non-pharmacologic comfort measures Outcome: Progressing   Problem: Clinical Measurements: Goal: Ability to maintain clinical measurements within normal limits will improve Outcome: Progressing   Problem: Clinical Measurements: Goal: Diagnostic test results will improve Outcome: Progressing   Problem: Nutrition: Goal: Adequate nutrition will be maintained Outcome: Progressing   Problem: Elimination: Goal: Will not experience complications related to bowel motility Outcome: Progressing

## 2023-08-23 NOTE — Progress Notes (Addendum)
Patient ID: Kaitlin Villarreal, female   DOB: 09-16-1926, 89 y.o.   MRN: [id number]     Attending physician's note   I have taken a history, reviewed the chart, and examined the patient. I performed a substantive portion of this encounter, including complete performance of at least one of the key components, in conjunction with the APP. I agree with the APP's note, impression, and recommendations with my edits.  No acute events overnight.  No further bleeding.  Tolerating p.o. without issue.  Agree that H/H of 8.1/25.1 today seems more in line with posttransfusion equilibration, particularly with lack of overt bleeding.  GI service will remain available as needed.  Narciso Stoutenburg, DO, FACG [phone] office          Progress Note   Subjective   Day # 3 CC; acute lower GI bleed  Patient sitting up in chair, says she feels better, waiting for lunch, has done fine with advancement in diet, she does not believe that she has had any further stools since yesterday  Hemoglobin 8.1/hematocrit 25.1-likely secondary to equilibration     Objective   Vital signs in last 24 hours: Temp:  [98.4 F (36.9 C)] 98.4 F (36.9 C) (11/22 0358) Pulse Rate:  [43-65] 43 (11/22 0829) Resp:  [18-19] 19 (11/22 0829) BP: (122-167)/(34-52) 135/34 (11/22 0829) SpO2:  [95 %-97 %] 95 % (11/22 0829) Last BM Date : 08/22/23 General:   Very elderly white female in NAD Heart:  Regular rate and rhythm; no murmurs Lungs: Respirations even and unlabored, lungs CTA bilaterally Abdomen:  Soft, nontender and nondistended. Normal bowel sounds. Extremities:  Without edema. Neurologic:  Alert and oriented,  grossly normal neurologically. Psych:  Cooperative. Normal mood and affect.  Intake/Output from previous day: 11/21 0701 - 11/22 0700 In: 120 [P.O.:120] Out: 700 [Urine:700] Intake/Output this shift: No intake/output data recorded.  Lab Results: Recent Labs    08/20/23 2059 08/21/23 0449 08/21/23 1655  08/22/23 1011 08/22/23 1622 08/23/23 0733  WBC 6.1 5.6  --   --   --  10.5  HGB 9.1* 5.9*   < > 10.4* 9.9* 8.1*  HCT 29.0* 18.5*   < > 31.0* 29.0* 25.1*  PLT 247 190  --   --   --  160   < > = values in this interval not displayed.   BMET Recent Labs    08/21/23 0813 08/22/23 0456 08/23/23 0733  NA 144 141 140  K 4.6 4.6 4.5  CL 115* 113* 113*  CO2 22 21* 23  GLUCOSE 94 162* 97  BUN 36* 47* 51*  CREATININE 1.44* 1.79* 1.52*  CALCIUM 8.2* 7.8* 7.8*   LFT Recent Labs    08/20/23 2059  PROT 7.1  ALBUMIN 3.7  AST 34  ALT 18  ALKPHOS 53  BILITOT 0.7   PT/INR Recent Labs    08/20/23 2115  LABPROT 12.6  INR 0.9    Studies/Results: CT ANGIO GI BLEED  Result Date: 08/22/2023 CLINICAL DATA:  Lower GI bleed. EXAM: CTA ABDOMEN AND PELVIS WITHOUT AND WITH CONTRAST TECHNIQUE: Multidetector CT imaging of the abdomen and pelvis was performed using the standard protocol during bolus administration of intravenous contrast. Multiplanar reconstructed images and MIPs were obtained and reviewed to evaluate the vascular anatomy. RADIATION DOSE REDUCTION: This exam was performed according to the departmental dose-optimization program which includes automated exposure control, adjustment of the mA and/or kV according to patient size and/or use of iterative reconstruction technique. CONTRAST:   OMNIPAQUE IOHEXOL 350 MG/ML SOLN COMPARISON:  05/17/2021, 02/20/2018, 05/27/2023. FINDINGS: VASCULAR Aorta: Normal caliber aorta without aneurysm, dissection, vasculitis or significant stenosis. Aortic atherosclerosis. Celiac: Patent without evidence of aneurysm, dissection, vasculitis. Atherosclerotic calcification at the ostia resulting in moderate stenosis and mild poststenotic dilatation. SMA: Patent without evidence of aneurysm, dissection, vasculitis or significant stenosis. Renals: Both renal arteries are patent without evidence of aneurysm, dissection, vasculitis, fibromuscular dysplasia.  Atherosclerotic calcification of the renal ostia bilaterally with high-grade stenosis on the left and moderate stenosis on the right. IMA: Patent without evidence of aneurysm, dissection, vasculitis or significant stenosis. Inflow: Patent without evidence of aneurysm, dissection, vasculitis or significant stenosis. Proximal Outflow: Bilateral common femoral and visualized portions of the superficial and profunda femoral arteries are patent without evidence of aneurysm, dissection, vasculitis or significant stenosis. Veins: No obvious venous abnormality within the limitations of this arterial phase study. Review of the MIP images confirms the above findings. NON-VASCULAR Lower chest: Atelectasis or scarring is noted at the lung bases. There is a trace right pleural effusion. Pacemaker leads are noted in the heart. Hepatobiliary: No focal liver abnormality is seen. No gallstones, gallbladder wall thickening, or biliary dilatation. Pancreas: There is a cyst in the pancreatic tail measuring 1.5 cm, not significantly changed. A cyst is noted in the pancreatic body measuring 1 cm. And a cyst is seen in the pancreatic tail measuring 4 mm. No pancreatic ductal dilatation or surrounding inflammatory changes. Spleen: Normal in size without focal abnormality. Adrenals/Urinary Tract: The adrenal glands are within normal limits. Renal cysts are noted bilaterally. The kidneys enhance symmetrically. No renal calculus or hydronephrosis bilaterally. The bladder is unremarkable. Stomach/Bowel: Stomach is within normal limits. Appendix is not seen. No evidence of bowel wall thickening, distention, or inflammatory changes. No free air or pneumatosis. Scattered diverticula are present along the colon without evidence of diverticulitis. No acute or active hemorrhage is seen. Lymphatic: No abdominal or pelvic lymphadenopathy. Reproductive: Status post hysterectomy. No adnexal masses. Other: Small amount of perihepatic free fluid.  Musculoskeletal: Sclerosis is noted at the sacroiliac joints bilaterally. There are degenerative changes in the thoracolumbar spine. No acute osseous abnormality is seen. IMPRESSION: VASCULAR 1. No evidence of active hemorrhage. 2. Aortic atherosclerosis without aneurysm or dissection. 3. Atherosclerotic calcification of the renal ostia on the left resulting in high-grade stenosis. NON-VASCULAR 1. No evidence of acute or active hemorrhage. 2. Diverticulosis without diverticulitis. 3. Trace right pleural effusion with atelectasis at the lung bases. 4. Stable cyst in the pancreatic tail measuring 1.5 cm, unchanged from 2022. Additional new cysts are seen in the pancreatic body and tail. MRI/MRCP with pancreatic protocol are recommended for further evaluation. Electronically Signed   By: Thornell Sartorius M.D.   On: 08/22/2023 02:58       Assessment / Plan:    #49 89 year old white female with acute lower GI bleed presumed secondary to diverticular hemorrhage with multiple diverticuli noted at CTA and no evidence of active bleeding.  She has not had any evidence of active bleeding in over 24 hours, hemoglobin has drifted but suspect that is secondary to equilibration after bleeding and transfusions.    Plan; advancing to soft diet today Should be okay for discharge in the next 24 hours as long as hemoglobin stays stable GI will be available if needed   Principal Problem:   GI bleed Active Problems:   Essential hypertension   VSD (ventricular septal defect)   CKD (chronic kidney disease) stage 3, GFR 30-59 ml/min (HCC)  HLD (hyperlipidemia)   Chronic diastolic (congestive) heart failure (HCC)   Stage 3 chronic kidney disease due to benign hypertension (HCC)   Hematochezia   ABLA (acute blood loss anemia)   Diverticulosis of colon with hemorrhage     LOS: 3 days   Amy Esterwood PA-C 08/23/2023, 10:37 AM

## 2023-08-24 DIAGNOSIS — K922 Gastrointestinal hemorrhage, unspecified: Secondary | ICD-10-CM | POA: Diagnosis not present

## 2023-08-24 LAB — TYPE AND SCREEN
ABO/RH(D): O POS
Antibody Screen: NEGATIVE
Unit division: 0
Unit division: 0
Unit division: 0
Unit division: 0

## 2023-08-24 LAB — BASIC METABOLIC PANEL
Anion gap: 11 (ref 5–15)
BUN: 48 mg/dL — ABNORMAL HIGH (ref 8–23)
CO2: 20 mmol/L — ABNORMAL LOW (ref 22–32)
Calcium: 8.1 mg/dL — ABNORMAL LOW (ref 8.9–10.3)
Chloride: 110 mmol/L (ref 98–111)
Creatinine, Ser: 1.55 mg/dL — ABNORMAL HIGH (ref 0.44–1.00)
GFR, Estimated: 31 mL/min — ABNORMAL LOW (ref >60)
Glucose, Bld: 95 mg/dL (ref 70–99)
Potassium: 4.6 mmol/L (ref 3.5–5.1)
Sodium: 141 mmol/L (ref 135–145)

## 2023-08-24 LAB — BPAM RBC
Blood Product Expiration Date: 202412152359
Blood Product Expiration Date: 202412152359
Blood Product Expiration Date: 202412162359
Blood Product Expiration Date: 202412162359
ISSUE DATE / TIME: 202411200458
ISSUE DATE / TIME: 202411201037
ISSUE DATE / TIME: 202411202311
Unit Type and Rh: 5100
Unit Type and Rh: 5100
Unit Type and Rh: 5100
Unit Type and Rh: 5100

## 2023-08-24 LAB — HEMOGLOBIN AND HEMATOCRIT, BLOOD
HCT: 24.6 % — ABNORMAL LOW (ref 36.0–46.0)
Hemoglobin: 8 g/dL — ABNORMAL LOW (ref 12.0–15.0)

## 2023-08-24 LAB — CBC
HCT: 26.1 % — ABNORMAL LOW (ref 36.0–46.0)
Hemoglobin: 8.4 g/dL — ABNORMAL LOW (ref 12.0–15.0)
MCH: 30.1 pg (ref 26.0–34.0)
MCHC: 32.2 g/dL (ref 30.0–36.0)
MCV: 93.5 fL (ref 80.0–100.0)
Platelets: 156 10*3/uL (ref 150–400)
RBC: 2.79 MIL/uL — ABNORMAL LOW (ref 3.87–5.11)
RDW: 16.1 % — ABNORMAL HIGH (ref 11.5–15.5)
WBC: 7 10*3/uL (ref 4.0–10.5)
nRBC: 0 % (ref 0.0–0.2)

## 2023-08-24 NOTE — Plan of Care (Signed)

## 2023-08-24 NOTE — Progress Notes (Signed)
PROGRESS NOTE    Kaitlin Villarreal  MVH:[id number] DOB: [birth year] DOA: 08/20/2023 PCP: Annamaria Helling, DO    Brief Narrative:   Kaitlin Villarreal is a 89 y.o. female with past medical history significant for chronic diastolic congestive heart failure, HTN, HLD, history of severe aortic stenosis s/p TAVR, complete heart block s/p PPM, CKD stage IIIb cognitive impairment who presented to [facility] ED on 11/19 from home via EMS due to rectal bleeding.  Patient reports while going to the restroom, had bowel movement with commode filled with bright red blood.  Utilizes aspirin 81 mg p.o. daily but no other anticoagulants outpatient.  Denies abdominal pain, no previous history of GI bleed.  Also distant/remote history of colonoscopy.  In the ED, temperature 98.9 F, HR 79, RR 25, BP 173/54, SpO2 96% on room air.  WBC 6.1, hemoglobin 9.1, platelet count 247.  Sodium 143, potassium 4.4, chloride 111, CO2 24, glucose 101, BUN 36, creatinine 1.31.  AST 34, ALT 18, total bilirubin 0.7.  INR 0.9.  FOBT positive.  EDP consulted gastroenterology, Dr. Leone Payor.  TRH consulted for admission for further evaluation and management of acute lower GI bleed.  Assessment & Plan:   Lower GI bleed, acute Patient presenting to ED with bright red blood per rectum.  Hemoglobin 9.1 on admission.  FOBT positive.  On aspirin 81 mg p.o. daily outpatient, no other anticoagulant/antiplatelet.  No previous history of GI bleed.  CT angiogram GI bleed study with no evidence of active hemorrhage. -- [facility] following, appreciate assistance -- Hgb 9.1>5.9>8.9>6.5>9.3>10.4>9.9>8.1>8.4 -- Transfused 3 unit PRBCs  -- Holding home aspirin -- H&H qam  Chronic diastolic congestive heart failure Essential hypertension Home regimen includes metoprolol tartrate 50 mg p.o. twice daily, amlodipine 5 mg p.o. daily, lisinopril 40 mg p.o. daily, furosemide 20 mg on Monday/Wednesday/Friday, hydralazine as needed. -- Amlodipine 5 mg PO daily -- Hold  home furosemide/lisinopril -- Hydralazine 10mg  PO q6h PRN SBP >170 or DBP >110   History of severe aortic stenosis s/p TAVR TTE 03/2022 with LVEF 70%, normal functioning of her bioprosthetic valve. -- Outpatient follow-up with cardiology  CKD stage IIIb Baseline creatinine 1.4-1.5 over the past 1 year. -- Cr 1.31>1.44>1.79>1.52>1.55 -- Continue to hold home lisinopril for now -- Encourage increased oral intake -- BMP daily  Pancreatic cysts CT angiogram with stable tail pancreatic cyst measuring 1.5 cm, additional new cyst pancreatic body.  Outpatient follow-up with GI, given advanced age, unlikely surgical candidate.  Cognitive impairment -- Delirium precautions -- Get up during the day -- Encourage a familiar face to remain present throughout the day -- Keep blinds open and lights on during daylight hours -- Minimize the use of opioids/benzodiazepines  Weakness/debility/deconditioning: -- PT/OT: No recommendations   DVT prophylaxis: SCDs Start: 08/20/23 2359    Code Status: Limited: Do not attempt resuscitation (DNR) -DNR-LIMITED -Do Not Intubate/DNI  Family Communication: Attempted to update patient's daughter Chip Boer via telephone this morning, no answer and no voicebox available  Disposition Plan:  Level of care: Telemetry Medical Status is: Inpatient Remains inpatient appropriate because: Anticipate discharge home tomorrow if hemoglobin remained stable    Consultants:  [facility]  Procedures:  None  Antimicrobials:  None   Subjective: Patient seen examined bedside, resting comfortably in bed.  Seen by PT and OT with no recommendations.  Hemoglobin slowly rising.  Denies any significant bowel movements over the last 2 days.  Patient reports daughter weary about patient discharging home yet.  Discussed with patient will  continue to monitor for 1 more day but if hemoglobin remains stable anticipate discharge home tomorrow.  No other specific complaints,  concerns or questions at this time.  Appreciative all the care she is received during the hospitalization.  Denies headache, no dizziness, no chest pain, no palpitations, no shortness of breath, no abdominal pain, no fever/chills/night sweats, no nausea/vomiting/diarrhea, no focal weakness, no fatigue, no paresthesias.  No other acute events overnight per nursing staff.  Objective: Vitals:   08/23/23 2019 08/24/23 0452 08/24/23 0754 08/24/23 0929  BP: (!) 146/36 (!) 152/52 (!) 148/42 (!) 177/42  Pulse: 69 70    Resp: 18 18    Temp: 97.7 F (36.5 C) 98.4 F (36.9 C) 98.2 F (36.8 C)   TempSrc: Oral Oral Oral   SpO2: 94% 95%     No intake or output data in the 24 hours ending 08/24/23 1140  There were no vitals filed for this visit.  Examination:  Physical Exam: GEN: NAD, alert and oriented x 3, elderly appearance HEENT: NCAT, PERRL, EOMI, sclera clear, MMM PULM: CTAB w/o wheezes/crackles, normal respiratory effort, on room air CV: RRR w/o M/G/R GI: abd soft, NTND, NABS, no R/G/M MSK: no peripheral edema, muscle strength globally intact 5/5 bilateral upper/lower extremities NEURO: CN II-XII intact, no focal deficits, sensation to light touch intact PSYCH: normal mood/affect Integumentary: No concerning rashes/lesions/wounds noted on exposed skin surfaces    Data Reviewed: I have personally reviewed following labs and imaging studies  CBC: Recent Labs  Lab 08/20/23 2059 08/21/23 0449 08/21/23 1655 08/23/23 0733 08/23/23 1740 08/23/23 1929 08/24/23 0113 08/24/23 0537  WBC 6.1 5.6  --  10.5  --   --   --  7.0  NEUTROABS 4.0  --   --   --   --   --   --   --   HGB 9.1* 5.9*   < > 8.1* 8.7* 9.7* 8.0* 8.4*  HCT 29.0* 18.5*   < > 25.1* 26.6* 28.9* 24.6* 26.1*  MCV 101.4* 100.0  --  91.3  --   --   --  93.5  PLT 247 190  --  160  --   --   --  156   < > = values in this interval not displayed.   Basic Metabolic Panel: Recent Labs  Lab 08/20/23 2059 08/21/23 0813  08/22/23 0456 08/23/23 0733 08/24/23 0537  NA 143 144 141 140 141  K 4.4 4.6 4.6 4.5 4.6  CL 111 115* 113* 113* 110  CO2 24 22 21* 23 20*  GLUCOSE 101* 94 162* 97 95  BUN 36* 36* 47* 51* 48*  CREATININE 1.31* 1.44* 1.79* 1.52* 1.55*  CALCIUM 9.2 8.2* 7.8* 7.8* 8.1*   GFR: CrCl cannot be calculated (Unknown ideal weight.). Liver Function Tests: Recent Labs  Lab 08/20/23 2059  AST 34  ALT 18  ALKPHOS 53  BILITOT 0.7  PROT 7.1  ALBUMIN 3.7   No results for input(s): "LIPASE", "AMYLASE" in the last 168 hours. No results for input(s): "AMMONIA" in the last 168 hours. Coagulation Profile: Recent Labs  Lab 08/20/23 2115  INR 0.9   Cardiac Enzymes: No results for input(s): "CKTOTAL", "CKMB", "CKMBINDEX", "TROPONINI" in the last 168 hours. BNP (last 3 results) Recent Labs    12/10/22 1146  PROBNP 4,407*   HbA1C: No results for input(s): "HGBA1C" in the last 72 hours. CBG: No results for input(s): "GLUCAP" in the last 168 hours. Lipid Profile: No results for input(s): "CHOL", "  HDL", "LDLCALC", "TRIG", "CHOLHDL", "LDLDIRECT" in the last 72 hours. Thyroid Function Tests: No results for input(s): "TSH", "T4TOTAL", "FREET4", "T3FREE", "THYROIDAB" in the last 72 hours. Anemia Panel: No results for input(s): "VITAMINB12", "FOLATE", "FERRITIN", "TIBC", "IRON", "RETICCTPCT" in the last 72 hours.  Sepsis Labs: No results for input(s): "PROCALCITON", "LATICACIDVEN" in the last 168 hours.  No results found for this or any previous visit (from the past 240 hour(s)).       Radiology Studies: No results found.      Scheduled Meds:  sodium chloride   Intravenous Once   amLODipine  5 mg Oral Daily   pantoprazole  40 mg Oral Daily   Continuous Infusions:     LOS: 4 days    Time spent: 51 minutes spent on chart review, discussion with nursing staff, consultants, updating family and interview/physical exam; more than 50% of that time was spent in counseling and/or  coordination of care.    Alvira Philips [place], DO [facility] Available via Epic secure chat 7am-7pm After these hours, please refer to coverage provider listed on [email or web address] 08/24/2023, 11:40 AM

## 2023-08-25 DIAGNOSIS — K922 Gastrointestinal hemorrhage, unspecified: Secondary | ICD-10-CM | POA: Diagnosis not present

## 2023-08-25 LAB — BASIC METABOLIC PANEL
Anion gap: 9 (ref 5–15)
BUN: 30 mg/dL — ABNORMAL HIGH (ref 8–23)
CO2: 23 mmol/L (ref 22–32)
Calcium: 8.3 mg/dL — ABNORMAL LOW (ref 8.9–10.3)
Chloride: 107 mmol/L (ref 98–111)
Creatinine, Ser: 1.44 mg/dL — ABNORMAL HIGH (ref 0.44–1.00)
GFR, Estimated: 33 mL/min — ABNORMAL LOW (ref >60)
Glucose, Bld: 94 mg/dL (ref 70–99)
Potassium: 4.2 mmol/L (ref 3.5–5.1)
Sodium: 139 mmol/L (ref 135–145)

## 2023-08-25 LAB — HEMOGLOBIN AND HEMATOCRIT, BLOOD
HCT: 29.3 % — ABNORMAL LOW (ref 36.0–46.0)
Hemoglobin: 9.3 g/dL — ABNORMAL LOW (ref 12.0–15.0)

## 2023-08-25 MED ORDER — AMLODIPINE BESYLATE 10 MG PO TABS
10.0000 mg | ORAL_TABLET | Freq: Every day | ORAL | Status: DC
  Administered 2023-08-26: 10 mg via ORAL
  Filled 2023-08-25: qty 1

## 2023-08-25 NOTE — Progress Notes (Signed)
PROGRESS NOTE    Kaitlin Villarreal  ION:[id number] DOB: 12-21-1925 DOA: 08/20/2023 PCP: Annamaria Helling, DO    Brief Narrative:   Kaitlin Villarreal is a 89 y.o. female with past medical history significant for chronic diastolic congestive heart failure, HTN, HLD, history of severe aortic stenosis s/p TAVR, complete heart block s/p PPM, CKD stage IIIb cognitive impairment who presented to [facility] ED on 11/19 from home via EMS due to rectal bleeding.  Patient reports while going to the restroom, had bowel movement with commode filled with bright red blood.  Utilizes aspirin 81 mg p.o. daily but no other anticoagulants outpatient.  Denies abdominal pain, no previous history of GI bleed.  Also distant/remote history of colonoscopy.  In the ED, temperature 98.9 F, HR 79, RR 25, BP 173/54, SpO2 96% on room air.  WBC 6.1, hemoglobin 9.1, platelet count 247.  Sodium 143, potassium 4.4, chloride 111, CO2 24, glucose 101, BUN 36, creatinine 1.31.  AST 34, ALT 18, total bilirubin 0.7.  INR 0.9.  FOBT positive.  EDP consulted gastroenterology, Dr. Leone Payor.  TRH consulted for admission for further evaluation and management of acute lower GI bleed.  Assessment & Plan:   Lower GI bleed, acute Patient presenting to ED with bright red blood per rectum.  Hemoglobin 9.1 on admission.  FOBT positive.  On aspirin 81 mg p.o. daily outpatient, no other anticoagulant/antiplatelet.  No previous history of GI bleed.  CT angiogram GI bleed study with no evidence of active hemorrhage. -- [facility] following, appreciate assistance -- Hgb 9.1>5.9>8.9>6.5>9.3>10.4>9.9>8.1>8.4>9.3 -- Transfused 3 unit PRBCs  -- Holding home aspirin -- H&H qam; anticipate discharge home tomorrow if hemoglobin remains stable  Chronic diastolic congestive heart failure Essential hypertension Home regimen includes metoprolol tartrate 50 mg p.o. twice daily, amlodipine 5 mg p.o. daily, lisinopril 40 mg p.o. daily, furosemide 20 mg on  Monday/Wednesday/Friday, hydralazine as needed. -- Increase amlodipine to 10 mg PO daily -- Hold home furosemide/lisinopril -- Hydralazine 10mg  PO q6h PRN SBP >170 or DBP >110   History of severe aortic stenosis s/p TAVR TTE 03/2022 with LVEF 70%, normal functioning of her bioprosthetic valve. -- Outpatient follow-up with cardiology  CKD stage IIIb Baseline creatinine 1.4-1.5 over the past 1 year. -- Cr 1.31>1.44>1.79>1.52>1.55>1.44 -- Continue to hold home lisinopril for now -- Encourage increased oral intake -- BMP daily  Pancreatic cysts CT angiogram with stable tail pancreatic cyst measuring 1.5 cm, additional new cyst pancreatic body.  Outpatient follow-up with GI, given advanced age, unlikely surgical candidate.  Cognitive impairment -- Delirium precautions -- Get up during the day -- Encourage a familiar face to remain present throughout the day -- Keep blinds open and lights on during daylight hours -- Minimize the use of opioids/benzodiazepines  Weakness/debility/deconditioning: -- PT/OT: No recommendations   DVT prophylaxis: SCDs Start: 08/20/23 2359    Code Status: Limited: Do not attempt resuscitation (DNR) -DNR-LIMITED -Do Not Intubate/DNI  Family Communication: Update patient's daughter Chip Boer via telephone this morning  Disposition Plan:  Level of care: Telemetry Medical Status is: Inpatient Remains inpatient appropriate because: Anticipate discharge home tomorrow if hemoglobin remains stable    Consultants:  [facility]  Procedures:  None  Antimicrobials:  None   Subjective: Patient seen examined bedside, resting comfortably in bed.  Hemoglobin up to 9.3 today.  Did have bowel movement this morning with dark stool with streaks of blood per RN; likely old blood that continues to transit from her colon.  Updated patient's daughter Chip Boer  via telephone this morning that we will likely monitor for 1 more day and if hemoglobin remains stable  tomorrow anticipate discharge home in which she is in agreement.  No other specific complaints, concerns or questions at this time.  Appreciative all the care she is received during the hospitalization.  Denies headache, no dizziness, no chest pain, no palpitations, no shortness of breath, no abdominal pain, no fever/chills/night sweats, no nausea/vomiting/diarrhea, no focal weakness, no fatigue, no paresthesias.  No other acute events overnight per nursing staff.  Objective: Vitals:   08/25/23 0513 08/25/23 0530 08/25/23 1005 08/25/23 1113  BP: (!) 172/39 (!) 158/49 (!) 152/51 (!) 162/44  Pulse:  69 68   Resp: 20 (!) 22 15   Temp:  97.9 F (36.6 C) 97.8 F (36.6 C)   TempSrc:   Oral   SpO2: 98% 95% 95%     Intake/Output Summary (Last 24 hours) at 08/25/2023 1123 Last data filed at 08/25/2023 0554 Gross per 24 hour  Intake 240 ml  Output 1000 ml  Net -760 ml    There were no vitals filed for this visit.  Examination:  Physical Exam: GEN: NAD, alert and oriented x 3, elderly appearance HEENT: NCAT, PERRL, EOMI, sclera clear, MMM PULM: CTAB w/o wheezes/crackles, normal respiratory effort, on room air CV: RRR w/o M/G/R GI: abd soft, NTND, NABS, no R/G/M MSK: no peripheral edema, muscle strength globally intact 5/5 bilateral upper/lower extremities NEURO: CN II-XII intact, no focal deficits, sensation to light touch intact PSYCH: normal mood/affect Integumentary: No concerning rashes/lesions/wounds noted on exposed skin surfaces    Data Reviewed: I have personally reviewed following labs and imaging studies  CBC: Recent Labs  Lab 08/20/23 2059 08/21/23 0449 08/21/23 1655 08/23/23 0733 08/23/23 1740 08/23/23 1929 08/24/23 0113 08/24/23 0537 08/25/23 0621  WBC 6.1 5.6  --  10.5  --   --   --  7.0  --   NEUTROABS 4.0  --   --   --   --   --   --   --   --   HGB 9.1* 5.9*   < > 8.1* 8.7* 9.7* 8.0* 8.4* 9.3*  HCT 29.0* 18.5*   < > 25.1* 26.6* 28.9* 24.6* 26.1* 29.3*   MCV 101.4* 100.0  --  91.3  --   --   --  93.5  --   PLT 247 190  --  160  --   --   --  156  --    < > = values in this interval not displayed.   Basic Metabolic Panel: Recent Labs  Lab 08/21/23 0813 08/22/23 0456 08/23/23 0733 08/24/23 0537 08/25/23 0621  NA 144 141 140 141 139  K 4.6 4.6 4.5 4.6 4.2  CL 115* 113* 113* 110 107  CO2 22 21* 23 20* 23  GLUCOSE 94 162* 97 95 94  BUN 36* 47* 51* 48* 30*  CREATININE 1.44* 1.79* 1.52* 1.55* 1.44*  CALCIUM 8.2* 7.8* 7.8* 8.1* 8.3*   GFR: CrCl cannot be calculated (Unknown ideal weight.). Liver Function Tests: Recent Labs  Lab 08/20/23 2059  AST 34  ALT 18  ALKPHOS 53  BILITOT 0.7  PROT 7.1  ALBUMIN 3.7   No results for input(s): "LIPASE", "AMYLASE" in the last 168 hours. No results for input(s): "AMMONIA" in the last 168 hours. Coagulation Profile: Recent Labs  Lab 08/20/23 2115  INR 0.9   Cardiac Enzymes: No results for input(s): "CKTOTAL", "CKMB", "CKMBINDEX", "TROPONINI" in the last  168 hours. BNP (last 3 results) Recent Labs    12/10/22 1146  PROBNP 4,407*   HbA1C: No results for input(s): "HGBA1C" in the last 72 hours. CBG: No results for input(s): "GLUCAP" in the last 168 hours. Lipid Profile: No results for input(s): "CHOL", "HDL", "LDLCALC", "TRIG", "CHOLHDL", "LDLDIRECT" in the last 72 hours. Thyroid Function Tests: No results for input(s): "TSH", "T4TOTAL", "FREET4", "T3FREE", "THYROIDAB" in the last 72 hours. Anemia Panel: No results for input(s): "VITAMINB12", "FOLATE", "FERRITIN", "TIBC", "IRON", "RETICCTPCT" in the last 72 hours.  Sepsis Labs: No results for input(s): "PROCALCITON", "LATICACIDVEN" in the last 168 hours.  No results found for this or any previous visit (from the past 240 hour(s)).       Radiology Studies: No results found.      Scheduled Meds:  sodium chloride   Intravenous Once   amLODipine  5 mg Oral Daily   pantoprazole  40 mg Oral Daily   Continuous  Infusions:     LOS: 5 days    Time spent: 51 minutes spent on chart review, discussion with nursing staff, consultants, updating family and interview/physical exam; more than 50% of that time was spent in counseling and/or coordination of care.    Alvira Philips [place], DO [facility] Available via Epic secure chat 7am-7pm After these hours, please refer to coverage provider listed on [email or web address] 08/25/2023, 11:23 AM

## 2023-08-25 NOTE — Plan of Care (Signed)

## 2023-08-26 DIAGNOSIS — K922 Gastrointestinal hemorrhage, unspecified: Secondary | ICD-10-CM | POA: Diagnosis not present

## 2023-08-26 LAB — BASIC METABOLIC PANEL
Anion gap: 8 (ref 5–15)
BUN: 29 mg/dL — ABNORMAL HIGH (ref 8–23)
CO2: 23 mmol/L (ref 22–32)
Calcium: 8.5 mg/dL — ABNORMAL LOW (ref 8.9–10.3)
Chloride: 105 mmol/L (ref 98–111)
Creatinine, Ser: 1.43 mg/dL — ABNORMAL HIGH (ref 0.44–1.00)
GFR, Estimated: 34 mL/min — ABNORMAL LOW (ref >60)
Glucose, Bld: 88 mg/dL (ref 70–99)
Potassium: 4.6 mmol/L (ref 3.5–5.1)
Sodium: 136 mmol/L (ref 135–145)

## 2023-08-26 LAB — HEMOGLOBIN AND HEMATOCRIT, BLOOD
HCT: 30.7 % — ABNORMAL LOW (ref 36.0–46.0)
Hemoglobin: 10 g/dL — ABNORMAL LOW (ref 12.0–15.0)

## 2023-08-26 MED ORDER — PANTOPRAZOLE SODIUM 40 MG PO TBEC: 40.0000 mg | DELAYED_RELEASE_TABLET | Freq: Every day | ORAL | 0 refills | Status: DC

## 2023-08-26 MED ORDER — ASPIRIN EC 81 MG PO TBEC: 81.0000 mg | DELAYED_RELEASE_TABLET | Freq: Every day | ORAL | Status: AC

## 2023-08-26 NOTE — Progress Notes (Signed)
Physical Therapy Treatment  Patient Details Name: Kaitlin Villarreal MRN: [id number] DOB: 02/16/1926 Today's Date: 08/26/2023   History of Present Illness Pt is a 89 y/o female who presents 08/20/2023 with rectal bleeding. PMH significant for acute on chronic dHF, aortic stenosis, renal mass, CHF, CKD III, HTN, pacemaker, pancreatic mass, TAVR, TIA.    PT Comments  Pt progressing towards physical therapy goals. Focus of session was stair training, as pt prefers to use the walk in shower up a flight of stairs in her home. Overall pt negotiating walker better today and was able to ambulate ~250' to the stairs and back to her room.  No overt LOB noted. Pt appears to be near baseline of function. Will continue to follow and progress as able per POC.    If plan is discharge home, recommend the following: A little help with walking and/or transfers;A little help with bathing/dressing/bathroom;Assistance with cooking/housework;Assist for transportation;Help with stairs or ramp for entrance   Can travel by private vehicle        Equipment Recommendations  None recommended by PT    Recommendations for Other Services       Precautions / Restrictions Precautions Precautions: Fall Restrictions Weight Bearing Restrictions: No     Mobility  Bed Mobility               General bed mobility comments: Pt was received sitting up in the recliner after OT session.    Transfers Overall transfer level: Needs assistance Equipment used: Rolling walker (2 wheels) Transfers: Sit to/from Stand Sit to Stand: Contact guard assist           General transfer comment: Light guard for safety as pt powered up to full stand.    Ambulation/Gait Ambulation/Gait assistance: Contact guard assist Gait Distance (Feet): 250 Feet Assistive device: Rolling walker (2 wheels) Gait Pattern/deviations: Step-through pattern, Decreased stride length, Trunk flexed Gait velocity: Decreased Gait velocity  interpretation: 1.31 - 2.62 ft/sec, indicative of limited community ambulator   General Gait Details: Slow but generally steady with RW for support. Pt able to ambulate to stairwell and then back to room with min directional cues.   Stairs Stairs: Yes Stairs assistance: Contact guard assist Stair Management: One rail Right, Alternating pattern, Step to pattern, Forwards, Sideways Number of Stairs: 12 General stair comments: VC's for sequencing and general safety. Pt able to alternate steps to ascend stairs. On descent, pt turned sideways and holding onto railing with 2 hands, demonstrating a step-to pattern.   Wheelchair Mobility     Tilt Bed    Modified Rankin (Stroke Patients Only)       Balance Overall balance assessment: Needs assistance Sitting-balance support: No upper extremity supported, Feet supported Sitting balance-Leahy Scale: Fair     Standing balance support: Bilateral upper extremity supported, During functional activity, No upper extremity supported Standing balance-Leahy Scale: Fair                              Cognition Arousal: Alert Behavior During Therapy: WFL for tasks assessed/performed Overall Cognitive Status: History of cognitive impairments - at baseline                                 General Comments: per chart hx of cognitive impairment.  Pt oriented and follows commands well.  She requires intermittent cueing for safety and recalling use of RW.  Exercises      General Comments General comments (skin integrity, edema, etc.): educated on TTB for downstairs tub, pt preference remains to use walk in shower on 2nd floor      Pertinent Vitals/Pain Pain Assessment Pain Assessment: No/denies pain    Home Living                          Prior Function            PT Goals (current goals can now be found in the care plan section) Acute Rehab PT Goals Patient Stated Goal: Be able to return to  her home. PT Goal Formulation: With patient Time For Goal Achievement: 08/30/23 Potential to Achieve Goals: Good Progress towards PT goals: Progressing toward goals    Frequency    Min 1X/week      PT Plan      Co-evaluation              AM-PAC PT "6 Clicks" Mobility   Outcome Measure  Help needed turning from your back to your side while in a flat bed without using bedrails?: A Little Help needed moving from lying on your back to sitting on the side of a flat bed without using bedrails?: A Little Help needed moving to and from a bed to a chair (including a wheelchair)?: A Little Help needed standing up from a chair using your arms (e.g., wheelchair or bedside chair)?: A Little Help needed to walk in hospital room?: A Little Help needed climbing 3-5 steps with a railing? : A Little 6 Click Score: 18    End of Session Equipment Utilized During Treatment: Gait belt Activity Tolerance: Patient tolerated treatment well Patient left: in bed;with call bell/phone within reach;with family/visitor present Nurse Communication: Mobility status PT Visit Diagnosis: Unsteadiness on feet (R26.81);Difficulty in walking, not elsewhere classified (R26.2)     Time: 1191-4782 PT Time Calculation (min) (ACUTE ONLY): 22 min  Charges:    $Gait Training: 8-22 mins PT General Charges $$ ACUTE PT VISIT: 1 Visit                     Conni Slipper, PT, DPT Acute Rehabilitation Services Secure Chat Preferred Office: [phone]    Kaitlin Villarreal 08/26/2023, 1:19 PM

## 2023-08-26 NOTE — Discharge Instructions (Signed)
Continue to hold your home aspirin for an additional 7 days before resuming.  Recommend appointment with her PCP in 1-2 weeks with repeat CBC to recheck hemoglobin level at that time.    FURTHER DISCHARGE INSTRUCTIONS:   Get Medicines reviewed and adjusted: Please take all your medications with you for your next visit with your Primary MD   Laboratory/radiological data: Please request your Primary MD to go over all hospital tests and procedure/radiological results at the follow up, please ask your Primary MD to get all Hospital records sent to his/her office.   In some cases, they will be blood work, cultures and biopsy results pending at the time of your discharge. Please request that your primary care M.D. goes through all the records of your hospital data and follows up on these results.   Also Note the following: If you experience worsening of your admission symptoms, develop shortness of breath, life threatening emergency, suicidal or homicidal thoughts you must seek medical attention immediately by calling 911 or calling your MD immediately  if symptoms less severe.   You must read complete instructions/literature along with all the possible adverse reactions/side effects for all the Medicines you take and that have been prescribed to you. Take any new Medicines after you have completely understood and accpet all the possible adverse reactions/side effects.    Do not drive when taking Pain medications or sleeping medications (Benzodaizepines)   Do not take more than prescribed Pain, Sleep and Anxiety Medications. It is not advisable to combine anxiety,sleep and pain medications without talking with your primary care practitioner   Special Instructions: If you have smoked or chewed Tobacco  in the last 2 yrs please stop smoking, stop any regular Alcohol  and or any Recreational drug use.   Wear Seat belts while driving.   Please note: You were cared for by a hospitalist during your  hospital stay. Once you are discharged, your primary care physician will handle any further medical issues. Please note that NO REFILLS for any discharge medications will be authorized once you are discharged, as it is imperative that you return to your primary care physician (or establish a relationship with a primary care physician if you do not have one) for your post hospital discharge needs so that they can reassess your need for medications and monitor your lab values.

## 2023-08-26 NOTE — Discharge Summary (Signed)
Physician Discharge Summary  RANIAH MARON XBJ:[id number] DOB: [birth year] DOA: 08/20/2023  PCP: Annamaria Helling, DO  Admit date: 08/20/2023 Discharge date: 08/26/2023  Admitted From: Home Disposition: Home  Recommendations for Outpatient Follow-up:  Follow up with PCP in 1-2 weeks Continue to hold aspirin for 7 days then may restart Please obtain BMP/CBC in one week to reassess hemoglobin level and renal function following discharge  Home Health: PT/OT Equipment/Devices: none  Discharge Condition: Stable CODE STATUS: DNR Diet recommendation: Heart healthy diet  History of present illness:  Kaitlin Villarreal is a 89 y.o. female with past medical history significant for chronic diastolic congestive heart failure, HTN, HLD, history of severe aortic stenosis s/p TAVR, complete heart block s/p PPM, CKD stage IIIb cognitive impairment who presented to [facility] ED on 11/19 from home via EMS due to rectal bleeding.  Patient reports while going to the restroom, had bowel movement with commode filled with bright red blood.  Utilizes aspirin 81 mg p.o. daily but no other anticoagulants outpatient.  Denies abdominal pain, no previous history of GI bleed.  Also distant/remote history of colonoscopy.   In the ED, temperature 98.9 F, HR 79, RR 25, BP 173/54, SpO2 96% on room air.  WBC 6.1, hemoglobin 9.1, platelet count 247.  Sodium 143, potassium 4.4, chloride 111, CO2 24, glucose 101, BUN 36, creatinine 1.31.  AST 34, ALT 18, total bilirubin 0.7.  INR 0.9.  FOBT positive.  EDP consulted gastroenterology, Dr. Leone Payor.  TRH consulted for admission for further evaluation and management of acute lower GI bleed.  Hospital course:  Lower GI bleed, acute Patient presenting to ED with bright red blood per rectum.  Hemoglobin 9.1 on admission.  FOBT positive.  On aspirin 81 mg p.o. daily outpatient, no other anticoagulant/antiplatelet.  No previous history of GI bleed.  CT angiogram GI bleed study with no evidence  of active hemorrhage.  GI was consulted and followed during hospital course.  Patient was transfused 3 unit PRBCs during hospitalization.  Patient's hemoglobin stabilized and up trended from a low of 5.9-10.0 at time of discharge.  Recommend to continue to hold aspirin x 7 days following hospitalization and then may resume.  Recommend repeat CBC 1 week at PCP visit.   Chronic diastolic congestive heart failure Essential hypertension Home regimen includes metoprolol tartrate 50 mg p.o. twice daily, amlodipine 5 mg p.o. daily, lisinopril 40 mg p.o. daily, furosemide 20 mg on Monday/Wednesday/Friday, hydralazine as needed.   History of severe aortic stenosis s/p TAVR TTE 03/2022 with LVEF 70%, normal functioning of her bioprosthetic valve. Outpatient follow-up with cardiology   CKD stage IIIb Baseline creatinine 1.4-1.5 over the past 1 year.  Creatinine 1.43 at time of discharge, stable.   Pancreatic cysts CT angiogram with stable tail pancreatic cyst measuring 1.5 cm, additional new cyst pancreatic body.  Outpatient follow-up with GI, given advanced age, unlikely surgical candidate.   Cognitive impairment Supportive care.   Weakness/debility/deconditioning: Discharging with home health PT/OT.  Discharge Diagnoses:  Principal Problem:   GI bleed Active Problems:   Essential hypertension   VSD (ventricular septal defect)   CKD (chronic kidney disease) stage 3, GFR 30-59 ml/min (HCC)   HLD (hyperlipidemia)   Chronic diastolic (congestive) heart failure (HCC)   Stage 3 chronic kidney disease due to benign hypertension (HCC)   Hematochezia   ABLA (acute blood loss anemia)   Diverticulosis of colon with hemorrhage    Discharge Instructions  Discharge Instructions     Call MD  for:  difficulty breathing, headache or visual disturbances   Complete by: As directed    Call MD for:  extreme fatigue   Complete by: As directed    Call MD for:  persistant dizziness or light-headedness    Complete by: As directed    Call MD for:  persistant nausea and vomiting   Complete by: As directed    Call MD for:  severe uncontrolled pain   Complete by: As directed    Call MD for:  temperature >100.4   Complete by: As directed    Diet - low sodium heart healthy   Complete by: As directed    Increase activity slowly   Complete by: As directed       Allergies as of 08/26/2023       Reactions   Clarithromycin Other (See Comments)   Felt like body was swollen, felt awful"- and, angioedema Other Reaction(s): Angioedema* ask, Felt like body was swollen, felt awful"- and, angioedema   Latex Rash, Other (See Comments)   Causes sores and Rash-Generalized   Levofloxacin Other (See Comments)   Hallucinations   Azithromycin Other (See Comments)   Reaction??   Diphenhydramine Hcl    Other reaction(s): Unknown   Ivp Dye [iodinated Contrast Media]         Medication List     TAKE these medications    acetaminophen 650 MG CR tablet Commonly known as: TYLENOL Take 650-1,300 mg by mouth every 8 (eight) hours as needed for pain.   allopurinol 100 MG tablet Commonly known as: ZYLOPRIM Take 100 mg by mouth daily.   amLODipine 5 MG tablet Commonly known as: NORVASC Take 5 mg by mouth daily.   aspirin EC 81 MG tablet Take 1 tablet (81 mg total) by mouth daily. Start taking on: September 02, 2023 What changed: These instructions start on September 02, 2023. If you are unsure what to do until then, ask your doctor or other care provider.   B-12 2500 MCG Tabs Take 2,500 mcg by mouth daily.   estradiol 1 MG tablet Commonly known as: ESTRACE Take 0.5 mg by mouth daily.   Fish Oil 500 MG Caps Take 3 capsules by mouth daily.   fluticasone 50 MCG/ACT nasal spray Commonly known as: FLONASE Place 2 sprays into both nostrils daily as needed for allergies or rhinitis.   furosemide 20 MG tablet Commonly known as: LASIX TAKE 1 TABLET BY MOUTH DAILY What changed:  when to take  this additional instructions   hydrALAZINE 25 MG tablet Commonly known as: APRESOLINE Take 1 tablet (25 mg total) by mouth 3 (three) times daily as needed (when BP is over 180). What changed: when to take this   lisinopril 40 MG tablet Commonly known as: ZESTRIL Take 40 mg by mouth daily.   metoprolol tartrate 50 MG tablet Commonly known as: LOPRESSOR Take 1 tablet by mouth twice daily   Multi-Vitamin Gummies Chew Chew 2 tablets by mouth daily.   pantoprazole 40 MG tablet Commonly known as: PROTONIX Take 1 tablet (40 mg total) by mouth daily.   Theratears PF 0.25 % Soln Generic drug: Carboxymethylcellulose Sod PF Place 1 drop into both eyes in the morning and at bedtime.   Vitamin D (Ergocalciferol) 1.25 MG (50000 UNIT) Caps capsule Commonly known as: DRISDOL Take 50,000 Units by mouth See admin instructions. Take 50,000 units by mouth 2 times a week- Mondays and Fridays        Follow-up Information  [facility] Follow up.   Specialty: Home Health Services Why: for home health services Contact information: [address] [place] [address] [phone]         Annamaria Helling, DO. Schedule an appointment as soon as possible for a visit in 1 week(s).   Specialty: Family Medicine Contact information: [address] [place] [address] [phone]                Allergies  Allergen Reactions   Clarithromycin Other (See Comments)    Felt like body was swollen, felt awful"- and, angioedema  Other Reaction(s): Angioedema*  ask, Felt like body was swollen, felt awful"- and, angioedema   Latex Rash and Other (See Comments)    Causes sores and Rash-Generalized   Levofloxacin Other (See Comments)    Hallucinations    Azithromycin Other (See Comments)    Reaction??   Diphenhydramine Hcl     Other reaction(s): Unknown   Ivp Dye [Iodinated Contrast Media]     Consultations: Echo  gastroenterology   Procedures/Studies: CT ANGIO GI BLEED  Result Date: 08/22/2023 CLINICAL DATA:  Lower GI bleed. EXAM: CTA ABDOMEN AND PELVIS WITHOUT AND WITH CONTRAST TECHNIQUE: Multidetector CT imaging of the abdomen and pelvis was performed using the standard protocol during bolus administration of intravenous contrast. Multiplanar reconstructed images and MIPs were obtained and reviewed to evaluate the vascular anatomy. RADIATION DOSE REDUCTION: This exam was performed according to the departmental dose-optimization program which includes automated exposure control, adjustment of the mA and/or kV according to patient size and/or use of iterative reconstruction technique. CONTRAST:  OMNIPAQUE IOHEXOL 350 MG/ML SOLN COMPARISON:  05/17/2021, 02/20/2018, 05/27/2023. FINDINGS: VASCULAR Aorta: Normal caliber aorta without aneurysm, dissection, vasculitis or significant stenosis. Aortic atherosclerosis. Celiac: Patent without evidence of aneurysm, dissection, vasculitis. Atherosclerotic calcification at the ostia resulting in moderate stenosis and mild poststenotic dilatation. SMA: Patent without evidence of aneurysm, dissection, vasculitis or significant stenosis. Renals: Both renal arteries are patent without evidence of aneurysm, dissection, vasculitis, fibromuscular dysplasia. Atherosclerotic calcification of the renal ostia bilaterally with high-grade stenosis on the left and moderate stenosis on the right. IMA: Patent without evidence of aneurysm, dissection, vasculitis or significant stenosis. Inflow: Patent without evidence of aneurysm, dissection, vasculitis or significant stenosis. Proximal Outflow: Bilateral common femoral and visualized portions of the superficial and profunda femoral arteries are patent without evidence of aneurysm, dissection, vasculitis or significant stenosis. Veins: No obvious venous abnormality within the limitations of this arterial phase study. Review of the MIP images  confirms the above findings. NON-VASCULAR Lower chest: Atelectasis or scarring is noted at the lung bases. There is a trace right pleural effusion. Pacemaker leads are noted in the heart. Hepatobiliary: No focal liver abnormality is seen. No gallstones, gallbladder wall thickening, or biliary dilatation. Pancreas: There is a cyst in the pancreatic tail measuring 1.5 cm, not significantly changed. A cyst is noted in the pancreatic body measuring 1 cm. And a cyst is seen in the pancreatic tail measuring 4 mm. No pancreatic ductal dilatation or surrounding inflammatory changes. Spleen: Normal in size without focal abnormality. Adrenals/Urinary Tract: The adrenal glands are within normal limits. Renal cysts are noted bilaterally. The kidneys enhance symmetrically. No renal calculus or hydronephrosis bilaterally. The bladder is unremarkable. Stomach/Bowel: Stomach is within normal limits. Appendix is not seen. No evidence of bowel wall thickening, distention, or inflammatory changes. No free air or pneumatosis. Scattered diverticula are present along the colon without evidence of diverticulitis. No  acute or active hemorrhage is seen. Lymphatic: No abdominal or pelvic lymphadenopathy. Reproductive: Status post hysterectomy. No adnexal masses. Other: Small amount of perihepatic free fluid. Musculoskeletal: Sclerosis is noted at the sacroiliac joints bilaterally. There are degenerative changes in the thoracolumbar spine. No acute osseous abnormality is seen. IMPRESSION: VASCULAR 1. No evidence of active hemorrhage. 2. Aortic atherosclerosis without aneurysm or dissection. 3. Atherosclerotic calcification of the renal ostia on the left resulting in high-grade stenosis. NON-VASCULAR 1. No evidence of acute or active hemorrhage. 2. Diverticulosis without diverticulitis. 3. Trace right pleural effusion with atelectasis at the lung bases. 4. Stable cyst in the pancreatic tail measuring 1.5 cm, unchanged from 2022. Additional new  cysts are seen in the pancreatic body and tail. MRI/MRCP with pancreatic protocol are recommended for further evaluation. Electronically Signed   By: Thornell Sartorius M.D.   On: 08/22/2023 02:58     Subjective: Patient seen examined at bedside, resting calmly.  Sleeping but easily arousable.  Hemoglobin continues to uptrend, 9.3 to 10.0 this morning.  Patient with no specific complaints or concerns this morning.  Ready for discharge home.  Discussed with patient to hold her aspirin for 7 days and then may resume.  Will need follow-up with her PCP closely for repeat CBC.  No other specific questions at this time.  Denies headache, no dizziness, no chest pain, no palpitations, no shortness of breath, no abdominal pain, no fever/chills/night sweats, no nausea/vomiting/diarrhea, no focal weakness, no fatigue, no paresthesias.  No acute events overnight per nursing staff.  Discharge Exam: Vitals:   08/25/23 1924 08/26/23 0753  BP: (!) 155/46 (!) 182/55  Pulse: 76 78  Resp: 17 16  Temp: (!) 97.5 F (36.4 C) 97.7 F (36.5 C)  SpO2: 97% 96%   Vitals:   08/25/23 1113 08/25/23 1513 08/25/23 1924 08/26/23 0753  BP: (!) 162/44 (!) 142/51 (!) 155/46 (!) 182/55  Pulse:  76 76 78  Resp:  20 17 16   Temp:  98.1 F (36.7 C) (!) 97.5 F (36.4 C) 97.7 F (36.5 C)  TempSrc:  Oral Oral Oral  SpO2:  96% 97% 96%    Physical Exam: GEN: NAD, alert and oriented x 3, elderly/thin in appearance HEENT: NCAT, PERRL, EOMI, sclera clear, MMM PULM: CTAB w/o wheezes/crackles, normal respiratory effort, on room air CV: RRR w/o M/G/R GI: abd soft, NTND, NABS, no R/G/M MSK: no peripheral edema, muscle strength globally intact 5/5 bilateral upper/lower extremities NEURO: CN II-XII intact, no focal deficits, sensation to light touch intact PSYCH: normal mood/affect Integumentary: dry/intact, no rashes or wounds    The results of significant diagnostics from this hospitalization (including imaging, microbiology,  ancillary and laboratory) are listed below for reference.     Microbiology: No results found for this or any previous visit (from the past 240 hour(s)).   Labs: BNP (last 3 results) No results for input(s): "BNP" in the last 8760 hours. Basic Metabolic Panel: Recent Labs  Lab 08/22/23 0456 08/23/23 0733 08/24/23 0537 08/25/23 0621 08/26/23 0640  NA 141 140 141 139 136  K 4.6 4.5 4.6 4.2 4.6  CL 113* 113* 110 107 105  CO2 21* 23 20* 23 23  GLUCOSE 162* 97 95 94 88  BUN 47* 51* 48* 30* 29*  CREATININE 1.79* 1.52* 1.55* 1.44* 1.43*  CALCIUM 7.8* 7.8* 8.1* 8.3* 8.5*   Liver Function Tests: Recent Labs  Lab 08/20/23 2059  AST 34  ALT 18  ALKPHOS 53  BILITOT 0.7  PROT 7.1  ALBUMIN 3.7   No results for input(s): "LIPASE", "AMYLASE" in the last 168 hours. No results for input(s): "AMMONIA" in the last 168 hours. CBC: Recent Labs  Lab 08/20/23 2059 08/21/23 0449 08/21/23 1655 08/23/23 0733 08/23/23 1740 08/23/23 1929 08/24/23 0113 08/24/23 0537 08/25/23 0621 08/26/23 0640  WBC 6.1 5.6  --  10.5  --   --   --  7.0  --   --   NEUTROABS 4.0  --   --   --   --   --   --   --   --   --   HGB 9.1* 5.9*   < > 8.1*   < > 9.7* 8.0* 8.4* 9.3* 10.0*  HCT 29.0* 18.5*   < > 25.1*   < > 28.9* 24.6* 26.1* 29.3* 30.7*  MCV 101.4* 100.0  --  91.3  --   --   --  93.5  --   --   PLT 247 190  --  160  --   --   --  156  --   --    < > = values in this interval not displayed.   Cardiac Enzymes: No results for input(s): "CKTOTAL", "CKMB", "CKMBINDEX", "TROPONINI" in the last 168 hours. BNP: Invalid input(s): "POCBNP" CBG: No results for input(s): "GLUCAP" in the last 168 hours. D-Dimer No results for input(s): "DDIMER" in the last 72 hours. Hgb A1c No results for input(s): "HGBA1C" in the last 72 hours. Lipid Profile No results for input(s): "CHOL", "HDL", "LDLCALC", "TRIG", "CHOLHDL", "LDLDIRECT" in the last 72 hours. Thyroid function studies No results for input(s): "TSH",  "T4TOTAL", "T3FREE", "THYROIDAB" in the last 72 hours.  Invalid input(s): "FREET3" Anemia work up No results for input(s): "VITAMINB12", "FOLATE", "FERRITIN", "TIBC", "IRON", "RETICCTPCT" in the last 72 hours. Urinalysis    Component Value Date/Time   COLORURINE YELLOW 03/14/2018 1058   APPEARANCEUR CLEAR 03/14/2018 1058   LABSPEC 1.017 03/14/2018 1058   PHURINE 5.0 03/14/2018 1058   GLUCOSEU NEGATIVE 03/14/2018 1058   HGBUR NEGATIVE 03/14/2018 1058   BILIRUBINUR NEGATIVE 03/14/2018 1058   KETONESUR NEGATIVE 03/14/2018 1058   PROTEINUR NEGATIVE 03/14/2018 1058   NITRITE NEGATIVE 03/14/2018 1058   LEUKOCYTESUR NEGATIVE 03/14/2018 1058   Sepsis Labs Recent Labs  Lab 08/20/23 2059 08/21/23 0449 08/23/23 0733 08/24/23 0537  WBC 6.1 5.6 10.5 7.0   Microbiology No results found for this or any previous visit (from the past 240 hour(s)).   Time coordinating discharge: Over 30 minutes  SIGNED:   Alvira Philips [place], DO  [facility] 08/26/2023, 10:31 AM

## 2023-08-26 NOTE — Care Plan (Signed)
Patient discharged home w/ home health services. Patient alert and oriented, able to make needs known. All discharge instructions including medication regimen and follow-up appointments explained to the patient and her daughter. PIV to L FA removed. All personal belongings taken with patient at discharge.

## 2023-08-26 NOTE — Progress Notes (Signed)
Occupational Therapy Treatment Patient Details Name: Kaitlin Villarreal MRN: [id number] DOB: 04/23/1926 Today's Date: 08/26/2023   History of present illness Pt is a 89 y/o female who presents 08/20/2023 with rectal bleeding. PMH significant for acute on chronic dHF, aortic stenosis, renal mass, CHF, CKD III, HTN, pacemaker, pancreatic mass, TAVR, TIA.   OT comments  Patient supine in bed and agreeable to OT.  Completing transfers and mobility with min guard to close supervision using RW, intermittent cueing for hand placement and RW mgmt safety.  Toileting with min assist, grooming at sink with close supervision.  Pt educated on recommendations for TTB for downstairs tub for bathing, but pt reports preference to use walk in shower upstairs (and will have assist as needed). Updated dc plan to HHOT to optimize independence, safety and reduce risk of falls with ADLs and mobility.  Will follow acutely.       If plan is discharge home, recommend the following:  A little help with walking and/or transfers;A little help with bathing/dressing/bathroom;Assistance with cooking/housework;Assist for transportation;Help with stairs or ramp for entrance;Direct supervision/assist for financial management;Direct supervision/assist for medications management   Equipment Recommendations  Tub/shower bench    Recommendations for Other Services      Precautions / Restrictions Precautions Precautions: Fall Restrictions Weight Bearing Restrictions: No       Mobility Bed Mobility Overal bed mobility: Needs Assistance Bed Mobility: Supine to Sit     Supine to sit: Supervision     General bed mobility comments: for safety, no physical assist required    Transfers Overall transfer level: Needs assistance Equipment used: Rolling walker (2 wheels) Transfers: Sit to/from Stand Sit to Stand: Contact guard assist           General transfer comment: cueing for hand placement, min gaurd for mild  unsteadiness     Balance Overall balance assessment: Needs assistance Sitting-balance support: No upper extremity supported, Feet supported Sitting balance-Leahy Scale: Fair     Standing balance support: Bilateral upper extremity supported, During functional activity, No upper extremity supported Standing balance-Leahy Scale: Fair                             ADL either performed or assessed with clinical judgement   ADL Overall ADL's : Needs assistance/impaired     Grooming: Supervision/safety;Standing               Lower Body Dressing: Contact guard assist;Sit to/from stand   Toilet Transfer: Ambulation;Rolling walker (2 wheels);Contact guard assist   Toileting- Clothing Manipulation and Hygiene: Minimal assistance;Sit to/from stand Toileting - Clothing Manipulation Details (indicate cue type and reason): for hygiene     Functional mobility during ADLs: Contact guard assist;Rolling walker (2 wheels)      Extremity/Trunk Assessment Upper Extremity Assessment Upper Extremity Assessment: Generalized weakness            Vision       Perception     Praxis      Cognition Arousal: Alert Behavior During Therapy: WFL for tasks assessed/performed Overall Cognitive Status: History of cognitive impairments - at baseline                                 General Comments: per chart hx of cognitive impairment.  Pt oriented and follows commands well.  She requires intermittent cueing for sfaety and recalling use of RW.  Exercises      Shoulder Instructions       General Comments educated on TTB for downstairs tub, pt preference remains to use walk in shower on 2nd floor    Pertinent Vitals/ Pain       Pain Assessment Pain Assessment: No/denies pain  Home Living                                          Prior Functioning/Environment              Frequency  Min 1X/week        Progress Toward  Goals  OT Goals(current goals can now be found in the care plan section)  Progress towards OT goals: Progressing toward goals  Acute Rehab OT Goals Patient Stated Goal: home today OT Goal Formulation: With patient Time For Goal Achievement: 09/06/23 Potential to Achieve Goals: Good  Plan      Co-evaluation                 AM-PAC OT "6 Clicks" Daily Activity     Outcome Measure   Help from another person eating meals?: A Little Help from another person taking care of personal grooming?: A Little Help from another person toileting, which includes using toliet, bedpan, or urinal?: A Little Help from another person bathing (including washing, rinsing, drying)?: A Little Help from another person to put on and taking off regular upper body clothing?: A Little Help from another person to put on and taking off regular lower body clothing?: A Little 6 Click Score: 18    End of Session Equipment Utilized During Treatment: Gait belt;Rolling walker (2 wheels)  OT Visit Diagnosis: Other abnormalities of gait and mobility (R26.89);Muscle weakness (generalized) (M62.81)   Activity Tolerance Patient tolerated treatment well   Patient Left in chair;with call bell/phone within reach;with chair alarm set   Nurse Communication Mobility status        Time: 4696-2952 OT Time Calculation (min): 21 min  Charges: OT General Charges $OT Visit: 1 Visit OT Treatments $Self Care/Home Management : 8-22 mins  Barry Brunner, OT Acute Rehabilitation Services Office [phone]   Chancy Milroy 08/26/2023, 11:41 AM

## 2023-08-28 DIAGNOSIS — E785 Hyperlipidemia, unspecified: Secondary | ICD-10-CM | POA: Diagnosis not present

## 2023-08-28 DIAGNOSIS — M109 Gout, unspecified: Secondary | ICD-10-CM | POA: Diagnosis not present

## 2023-08-28 DIAGNOSIS — I700 Atherosclerosis of aorta: Secondary | ICD-10-CM | POA: Diagnosis not present

## 2023-08-28 DIAGNOSIS — I5032 Chronic diastolic (congestive) heart failure: Secondary | ICD-10-CM | POA: Diagnosis not present

## 2023-08-28 DIAGNOSIS — Z950 Presence of cardiac pacemaker: Secondary | ICD-10-CM | POA: Diagnosis not present

## 2023-08-28 DIAGNOSIS — J9811 Atelectasis: Secondary | ICD-10-CM | POA: Diagnosis not present

## 2023-08-28 DIAGNOSIS — Q210 Ventricular septal defect: Secondary | ICD-10-CM | POA: Diagnosis not present

## 2023-08-28 DIAGNOSIS — Z7982 Long term (current) use of aspirin: Secondary | ICD-10-CM | POA: Diagnosis not present

## 2023-08-28 DIAGNOSIS — D631 Anemia in chronic kidney disease: Secondary | ICD-10-CM | POA: Diagnosis not present

## 2023-08-28 DIAGNOSIS — K5731 Diverticulosis of large intestine without perforation or abscess with bleeding: Secondary | ICD-10-CM | POA: Diagnosis not present

## 2023-08-28 DIAGNOSIS — Z953 Presence of xenogenic heart valve: Secondary | ICD-10-CM | POA: Diagnosis not present

## 2023-08-28 DIAGNOSIS — I442 Atrioventricular block, complete: Secondary | ICD-10-CM | POA: Diagnosis not present

## 2023-08-28 DIAGNOSIS — K862 Cyst of pancreas: Secondary | ICD-10-CM | POA: Diagnosis not present

## 2023-08-28 DIAGNOSIS — R32 Unspecified urinary incontinence: Secondary | ICD-10-CM | POA: Diagnosis not present

## 2023-08-28 DIAGNOSIS — Z9181 History of falling: Secondary | ICD-10-CM | POA: Diagnosis not present

## 2023-08-28 DIAGNOSIS — D62 Acute posthemorrhagic anemia: Secondary | ICD-10-CM | POA: Diagnosis not present

## 2023-08-28 DIAGNOSIS — I130 Hypertensive heart and chronic kidney disease with heart failure and stage 1 through stage 4 chronic kidney disease, or unspecified chronic kidney disease: Secondary | ICD-10-CM | POA: Diagnosis not present

## 2023-08-28 DIAGNOSIS — K219 Gastro-esophageal reflux disease without esophagitis: Secondary | ICD-10-CM | POA: Diagnosis not present

## 2023-08-28 DIAGNOSIS — N1832 Chronic kidney disease, stage 3b: Secondary | ICD-10-CM | POA: Diagnosis not present

## 2023-08-30 DIAGNOSIS — K5731 Diverticulosis of large intestine without perforation or abscess with bleeding: Secondary | ICD-10-CM | POA: Diagnosis not present

## 2023-08-30 DIAGNOSIS — D62 Acute posthemorrhagic anemia: Secondary | ICD-10-CM | POA: Diagnosis not present

## 2023-08-30 DIAGNOSIS — D631 Anemia in chronic kidney disease: Secondary | ICD-10-CM | POA: Diagnosis not present

## 2023-08-30 DIAGNOSIS — N1832 Chronic kidney disease, stage 3b: Secondary | ICD-10-CM | POA: Diagnosis not present

## 2023-08-30 DIAGNOSIS — I5032 Chronic diastolic (congestive) heart failure: Secondary | ICD-10-CM | POA: Diagnosis not present

## 2023-08-30 DIAGNOSIS — I130 Hypertensive heart and chronic kidney disease with heart failure and stage 1 through stage 4 chronic kidney disease, or unspecified chronic kidney disease: Secondary | ICD-10-CM | POA: Diagnosis not present

## 2023-09-01 NOTE — Progress Notes (Unsigned)
[facility]  [address] [place],  [place]  [address] [phone]  Clinic Day:  09/02/2023  Referring physician: Noni Saupe, MD   HISTORY OF PRESENT ILLNESS:  The patient is a 89 y.o. female  with anemia secondary to kidney disease.  She was receiving monthly Retacrit injections to get her hemoglobin above 10.  She comes in today to reassess her anemia.  Since her last visit, the patient was hospitalized in [place] for severe anemia.  The patient recalls having 1 bloody bowel movement, which led to her seeking attention.  While in the hospital, her hemoglobin was as low as 5.9.  She did receive 5 units of packed red blood cells while in the hospital.  A tagged red cell scan was done, for which no obvious GI source of blood loss was appreciated.  However, diverticulosis was seen.  She comes into clinic today still feeling somewhat weak.  However, she denies having additional episodes of blood loss.  PHYSICAL EXAM:  Blood pressure (!) 121/37, pulse 60, temperature (!) 97.5 F (36.4 C), resp. rate 14, height 5' (1.524 m), SpO2 100%. Wt Readings from Last 3 Encounters:  06/18/23 101 lb 12.8 oz (46.2 kg)  06/12/23 102 lb (46.3 kg)  05/13/23 100 lb 6.4 oz (45.5 kg)   Body mass index is 19.88 kg/m. Performance status (ECOG): 3 - Symptomatic, >50% confined to bed Physical Exam Constitutional:      Appearance: Normal appearance. She is not ill-appearing.     Comments: She looks weaker versus previous visits.  She is in a wheelchair.  HENT:     Mouth/Throat:     Mouth: Mucous membranes are moist.     Pharynx: Oropharynx is clear. No oropharyngeal exudate or posterior oropharyngeal erythema.  Cardiovascular:     Rate and Rhythm: Normal rate and regular rhythm.     Heart sounds: No murmur heard.    No friction rub. No gallop.  Pulmonary:     Effort: Pulmonary effort is normal. No respiratory distress.     Breath sounds: Normal breath sounds.  No wheezing, rhonchi or rales.  Abdominal:     General: Bowel sounds are normal. There is no distension.     Palpations: Abdomen is soft. There is no mass.     Tenderness: There is no abdominal tenderness.  Musculoskeletal:        General: No swelling.     Right lower leg: No edema.     Left lower leg: No edema.  Lymphadenopathy:     Cervical: No cervical adenopathy.     Upper Body:     Right upper body: No supraclavicular or axillary adenopathy.     Left upper body: No supraclavicular or axillary adenopathy.     Lower Body: No right inguinal adenopathy. No left inguinal adenopathy.  Skin:    General: Skin is warm.     Coloration: Skin is not jaundiced.     Findings: No lesion or rash.  Neurological:     General: No focal deficit present.     Mental Status: She is alert and oriented to person, place, and time. Mental status is at baseline.  Psychiatric:        Mood and Affect: Mood normal.        Behavior: Behavior normal.        Thought Content: Thought content normal.    LABS:      Latest Ref Rng & Units 09/02/2023  1:32 PM 08/26/2023    6:40 AM 08/25/2023    6:21 AM  CBC  WBC 4.0 - 10.5 K/uL 8.9     Hemoglobin 12.0 - 15.0 g/dL 16.1  09.6  9.3   Hematocrit 36.0 - 46.0 % 32.9  30.7  29.3   Platelets 150 - 400 K/uL 215         Latest Ref Rng & Units 09/02/2023    1:32 PM 08/26/2023    6:40 AM 08/25/2023    6:21 AM  CMP  Glucose 70 - 99 mg/dL 97  88  94   BUN 8 - 23 mg/dL 28  29  30    Creatinine 0.44 - 1.00 mg/dL 0.45  4.09  8.11   Sodium 135 - 145 mmol/L 142  136  139   Potassium 3.5 - 5.1 mmol/L 4.4  4.6  4.2   Chloride 98 - 111 mmol/L 107  105  107   CO2 22 - 32 mmol/L 26  23  23    Calcium 8.9 - 10.3 mg/dL 9.5  8.5  8.3   Total Protein 6.5 - 8.1 g/dL 7.0     Total Bilirubin <1.2 mg/dL 0.3     Alkaline Phos 38 - 126 U/L 68     AST 15 - 41 U/L 30     ALT 0 - 44 U/L 15       Latest Reference Range & Units 09/02/23 13:32  Iron 28 - 170 ug/dL 914  UIBC  ug/dL 782  TIBC 956 - 213 ug/dL 086  Saturation Ratios 10.4 - 31.8 % 30  Ferritin 11 - 307 ng/mL 147    STUDIES:  CT ANGIO GI BLEED  Result Date: 08/22/2023 CLINICAL DATA:  Lower GI bleed. EXAM: CTA ABDOMEN AND PELVIS WITHOUT AND WITH CONTRAST TECHNIQUE: Multidetector CT imaging of the abdomen and pelvis was performed using the standard protocol during bolus administration of intravenous contrast. Multiplanar reconstructed images and MIPs were obtained and reviewed to evaluate the vascular anatomy. RADIATION DOSE REDUCTION: This exam was performed according to the departmental dose-optimization program which includes automated exposure control, adjustment of the mA and/or kV according to patient size and/or use of iterative reconstruction technique. CONTRAST:  OMNIPAQUE IOHEXOL 350 MG/ML SOLN COMPARISON:  05/17/2021, 02/20/2018, 05/27/2023. FINDINGS: VASCULAR Aorta: Normal caliber aorta without aneurysm, dissection, vasculitis or significant stenosis. Aortic atherosclerosis. Celiac: Patent without evidence of aneurysm, dissection, vasculitis. Atherosclerotic calcification at the ostia resulting in moderate stenosis and mild poststenotic dilatation. SMA: Patent without evidence of aneurysm, dissection, vasculitis or significant stenosis. Renals: Both renal arteries are patent without evidence of aneurysm, dissection, vasculitis, fibromuscular dysplasia. Atherosclerotic calcification of the renal ostia bilaterally with high-grade stenosis on the left and moderate stenosis on the right. IMA: Patent without evidence of aneurysm, dissection, vasculitis or significant stenosis. Inflow: Patent without evidence of aneurysm, dissection, vasculitis or significant stenosis. Proximal Outflow: Bilateral common femoral and visualized portions of the superficial and profunda femoral arteries are patent without evidence of aneurysm, dissection, vasculitis or significant stenosis. Veins: No obvious venous abnormality  within the limitations of this arterial phase study. Review of the MIP images confirms the above findings. NON-VASCULAR Lower chest: Atelectasis or scarring is noted at the lung bases. There is a trace right pleural effusion. Pacemaker leads are noted in the heart. Hepatobiliary: No focal liver abnormality is seen. No gallstones, gallbladder wall thickening, or biliary dilatation. Pancreas: There is a cyst in the pancreatic tail measuring 1.5 cm, not significantly changed. A cyst  is noted in the pancreatic body measuring 1 cm. And a cyst is seen in the pancreatic tail measuring 4 mm. No pancreatic ductal dilatation or surrounding inflammatory changes. Spleen: Normal in size without focal abnormality. Adrenals/Urinary Tract: The adrenal glands are within normal limits. Renal cysts are noted bilaterally. The kidneys enhance symmetrically. No renal calculus or hydronephrosis bilaterally. The bladder is unremarkable. Stomach/Bowel: Stomach is within normal limits. Appendix is not seen. No evidence of bowel wall thickening, distention, or inflammatory changes. No free air or pneumatosis. Scattered diverticula are present along the colon without evidence of diverticulitis. No acute or active hemorrhage is seen. Lymphatic: No abdominal or pelvic lymphadenopathy. Reproductive: Status post hysterectomy. No adnexal masses. Other: Small amount of perihepatic free fluid. Musculoskeletal: Sclerosis is noted at the sacroiliac joints bilaterally. There are degenerative changes in the thoracolumbar spine. No acute osseous abnormality is seen. IMPRESSION: VASCULAR 1. No evidence of active hemorrhage. 2. Aortic atherosclerosis without aneurysm or dissection. 3. Atherosclerotic calcification of the renal ostia on the left resulting in high-grade stenosis. NON-VASCULAR 1. No evidence of acute or active hemorrhage. 2. Diverticulosis without diverticulitis. 3. Trace right pleural effusion with atelectasis at the lung bases. 4. Stable  cyst in the pancreatic tail measuring 1.5 cm, unchanged from 2022. Additional new cysts are seen in the pancreatic body and tail. MRI/MRCP with pancreatic protocol are recommended for further evaluation. Electronically Signed   By: Thornell Sartorius M.D.   On: 08/22/2023 02:58      ASSESSMENT & PLAN:  Assessment/Plan:  A 89 y.o. female with anemia secondary to chronic renal insufficiency.  I am pleased as her hemoglobin is over 10 today.  However, this is likely due to the 5 units of blood she recently received while in the hospital.  Her iron studies today show no evidence of iron deficiency being present.  For now, I will be conservative with her anemia management as only observation will be employed.  I will see this patient back in 3 months for repeat clinical assessment.  The patient understands all the plans discussed today and knows to contact our office before then if she has increased fatigue or other overt forms of blood loss which concern her for progressive anemia.    Jaxsun Ciampi Kirby Funk, MD

## 2023-09-02 ENCOUNTER — Inpatient Hospital Stay: Payer: Medicare Other

## 2023-09-02 ENCOUNTER — Other Ambulatory Visit: Payer: Self-pay | Admitting: Oncology

## 2023-09-02 ENCOUNTER — Inpatient Hospital Stay: Payer: Medicare Other | Attending: Oncology | Admitting: Oncology

## 2023-09-02 ENCOUNTER — Telehealth: Payer: Self-pay | Admitting: Oncology

## 2023-09-02 VITALS — BP 121/37 | HR 60 | Temp 97.5°F | Resp 14 | Ht 60.0 in

## 2023-09-02 DIAGNOSIS — D631 Anemia in chronic kidney disease: Secondary | ICD-10-CM | POA: Insufficient documentation

## 2023-09-02 DIAGNOSIS — N1832 Chronic kidney disease, stage 3b: Secondary | ICD-10-CM | POA: Diagnosis not present

## 2023-09-02 DIAGNOSIS — N189 Chronic kidney disease, unspecified: Secondary | ICD-10-CM

## 2023-09-02 LAB — CMP (CANCER CENTER ONLY)
ALT: 15 U/L (ref 0–44)
AST: 30 U/L (ref 15–41)
Albumin: 4 g/dL (ref 3.5–5.0)
Alkaline Phosphatase: 68 U/L (ref 38–126)
Anion gap: 9 (ref 5–15)
BUN: 28 mg/dL — ABNORMAL HIGH (ref 8–23)
CO2: 26 mmol/L (ref 22–32)
Calcium: 9.5 mg/dL (ref 8.9–10.3)
Chloride: 107 mmol/L (ref 98–111)
Creatinine: 1.64 mg/dL — ABNORMAL HIGH (ref 0.44–1.00)
GFR, Estimated: 29 mL/min — ABNORMAL LOW (ref >60)
Glucose, Bld: 97 mg/dL (ref 70–99)
Potassium: 4.4 mmol/L (ref 3.5–5.1)
Sodium: 142 mmol/L (ref 135–145)
Total Bilirubin: 0.3 mg/dL (ref <1.2)
Total Protein: 7 g/dL (ref 6.5–8.1)

## 2023-09-02 LAB — CBC WITH DIFFERENTIAL (CANCER CENTER ONLY)
Abs Immature Granulocytes: 0.05 10*3/uL (ref 0.00–0.07)
Basophils Absolute: 0 10*3/uL (ref 0.0–0.1)
Basophils Relative: 0 %
Eosinophils Absolute: 0 10*3/uL (ref 0.0–0.5)
Eosinophils Relative: 0 %
HCT: 32.9 % — ABNORMAL LOW (ref 36.0–46.0)
Hemoglobin: 10.6 g/dL — ABNORMAL LOW (ref 12.0–15.0)
Immature Granulocytes: 1 %
Lymphocytes Relative: 9 %
Lymphs Abs: 0.8 10*3/uL (ref 0.7–4.0)
MCH: 31.2 pg (ref 26.0–34.0)
MCHC: 32.2 g/dL (ref 30.0–36.0)
MCV: 96.8 fL (ref 80.0–100.0)
Monocytes Absolute: 0.3 10*3/uL (ref 0.1–1.0)
Monocytes Relative: 4 %
Neutro Abs: 7.7 10*3/uL (ref 1.7–7.7)
Neutrophils Relative %: 86 %
Platelet Count: 215 10*3/uL (ref 150–400)
RBC: 3.4 MIL/uL — ABNORMAL LOW (ref 3.87–5.11)
RDW: 15 % (ref 11.5–15.5)
WBC Count: 8.9 10*3/uL (ref 4.0–10.5)
nRBC: 0 % (ref 0.0–0.2)
nRBC: 0 /100{WBCs}

## 2023-09-02 LAB — IRON AND TIBC
Iron: 109 ug/dL (ref 28–170)
Saturation Ratios: 30 % (ref 10.4–31.8)
TIBC: 363 ug/dL (ref 250–450)
UIBC: 254 ug/dL

## 2023-09-02 LAB — FERRITIN: Ferritin: 147 ng/mL (ref 11–307)

## 2023-09-02 NOTE — Telephone Encounter (Signed)
09/02/23 Next appts scheduled and confirmed with patient.

## 2023-09-03 ENCOUNTER — Telehealth: Payer: Self-pay

## 2023-09-03 DIAGNOSIS — K5731 Diverticulosis of large intestine without perforation or abscess with bleeding: Secondary | ICD-10-CM | POA: Diagnosis not present

## 2023-09-03 DIAGNOSIS — R42 Dizziness and giddiness: Secondary | ICD-10-CM | POA: Diagnosis not present

## 2023-09-03 DIAGNOSIS — N1832 Chronic kidney disease, stage 3b: Secondary | ICD-10-CM | POA: Diagnosis not present

## 2023-09-03 DIAGNOSIS — I130 Hypertensive heart and chronic kidney disease with heart failure and stage 1 through stage 4 chronic kidney disease, or unspecified chronic kidney disease: Secondary | ICD-10-CM | POA: Diagnosis not present

## 2023-09-03 DIAGNOSIS — K922 Gastrointestinal hemorrhage, unspecified: Secondary | ICD-10-CM | POA: Diagnosis not present

## 2023-09-03 DIAGNOSIS — D62 Acute posthemorrhagic anemia: Secondary | ICD-10-CM | POA: Diagnosis not present

## 2023-09-03 DIAGNOSIS — D631 Anemia in chronic kidney disease: Secondary | ICD-10-CM | POA: Diagnosis not present

## 2023-09-03 DIAGNOSIS — I5032 Chronic diastolic (congestive) heart failure: Secondary | ICD-10-CM | POA: Diagnosis not present

## 2023-09-03 DIAGNOSIS — I951 Orthostatic hypotension: Secondary | ICD-10-CM | POA: Diagnosis not present

## 2023-09-03 DIAGNOSIS — Z789 Other specified health status: Secondary | ICD-10-CM | POA: Diagnosis not present

## 2023-09-03 NOTE — Telephone Encounter (Signed)
  Latest Reference Range & Units 09/02/23 13:32   Iron 28 - 170 ug/dL 604  UIBC ug/dL 540  TIBC 981 - 191 ug/dL 478  Saturation Ratios 10.4 - 31.8 % 30  Ferritin 11 - 307 ng/mL 147   ASSESSMENT & PLAN:  Assessment/Plan:  A 89 y.o. female with anemia secondary to chronic renal insufficiency.  I am pleased as her hemoglobin is over 10 today.  However, this is likely due to the 5 units of blood she recently received while in the hospital.  Her iron studies today show no evidence of iron deficiency being present.  For now, I will be conservative with her anemia management as only observation will be employed.  I will see this patient back in 3 months for repeat clinical assessment.  The patient understands all the plans discussed today and knows to contact our office before then if she has increased fatigue or other overt forms of blood loss which concern her for progressive anemia.     Weston Settle, MD                Electronically signed by Weston Settle, MD at 09/02/2023 10:59 PM

## 2023-09-04 ENCOUNTER — Ambulatory Visit: Payer: Medicare Other

## 2023-09-05 DIAGNOSIS — I130 Hypertensive heart and chronic kidney disease with heart failure and stage 1 through stage 4 chronic kidney disease, or unspecified chronic kidney disease: Secondary | ICD-10-CM | POA: Diagnosis not present

## 2023-09-05 DIAGNOSIS — N1832 Chronic kidney disease, stage 3b: Secondary | ICD-10-CM | POA: Diagnosis not present

## 2023-09-05 DIAGNOSIS — I5032 Chronic diastolic (congestive) heart failure: Secondary | ICD-10-CM | POA: Diagnosis not present

## 2023-09-05 DIAGNOSIS — D62 Acute posthemorrhagic anemia: Secondary | ICD-10-CM | POA: Diagnosis not present

## 2023-09-05 DIAGNOSIS — K5731 Diverticulosis of large intestine without perforation or abscess with bleeding: Secondary | ICD-10-CM | POA: Diagnosis not present

## 2023-09-05 DIAGNOSIS — D631 Anemia in chronic kidney disease: Secondary | ICD-10-CM | POA: Diagnosis not present

## 2023-09-06 DIAGNOSIS — D62 Acute posthemorrhagic anemia: Secondary | ICD-10-CM | POA: Diagnosis not present

## 2023-09-06 DIAGNOSIS — N1832 Chronic kidney disease, stage 3b: Secondary | ICD-10-CM | POA: Diagnosis not present

## 2023-09-06 DIAGNOSIS — I130 Hypertensive heart and chronic kidney disease with heart failure and stage 1 through stage 4 chronic kidney disease, or unspecified chronic kidney disease: Secondary | ICD-10-CM | POA: Diagnosis not present

## 2023-09-06 DIAGNOSIS — K5731 Diverticulosis of large intestine without perforation or abscess with bleeding: Secondary | ICD-10-CM | POA: Diagnosis not present

## 2023-09-06 DIAGNOSIS — D631 Anemia in chronic kidney disease: Secondary | ICD-10-CM | POA: Diagnosis not present

## 2023-09-06 DIAGNOSIS — I5032 Chronic diastolic (congestive) heart failure: Secondary | ICD-10-CM | POA: Diagnosis not present

## 2023-09-09 DIAGNOSIS — D631 Anemia in chronic kidney disease: Secondary | ICD-10-CM | POA: Diagnosis not present

## 2023-09-09 DIAGNOSIS — N1832 Chronic kidney disease, stage 3b: Secondary | ICD-10-CM | POA: Diagnosis not present

## 2023-09-09 DIAGNOSIS — I130 Hypertensive heart and chronic kidney disease with heart failure and stage 1 through stage 4 chronic kidney disease, or unspecified chronic kidney disease: Secondary | ICD-10-CM | POA: Diagnosis not present

## 2023-09-09 DIAGNOSIS — K5731 Diverticulosis of large intestine without perforation or abscess with bleeding: Secondary | ICD-10-CM | POA: Diagnosis not present

## 2023-09-09 DIAGNOSIS — I5032 Chronic diastolic (congestive) heart failure: Secondary | ICD-10-CM | POA: Diagnosis not present

## 2023-09-09 DIAGNOSIS — D62 Acute posthemorrhagic anemia: Secondary | ICD-10-CM | POA: Diagnosis not present

## 2023-09-11 DIAGNOSIS — N1832 Chronic kidney disease, stage 3b: Secondary | ICD-10-CM | POA: Diagnosis not present

## 2023-09-11 DIAGNOSIS — I5032 Chronic diastolic (congestive) heart failure: Secondary | ICD-10-CM | POA: Diagnosis not present

## 2023-09-11 DIAGNOSIS — I130 Hypertensive heart and chronic kidney disease with heart failure and stage 1 through stage 4 chronic kidney disease, or unspecified chronic kidney disease: Secondary | ICD-10-CM | POA: Diagnosis not present

## 2023-09-11 DIAGNOSIS — D631 Anemia in chronic kidney disease: Secondary | ICD-10-CM | POA: Diagnosis not present

## 2023-09-11 DIAGNOSIS — D62 Acute posthemorrhagic anemia: Secondary | ICD-10-CM | POA: Diagnosis not present

## 2023-09-11 DIAGNOSIS — K5731 Diverticulosis of large intestine without perforation or abscess with bleeding: Secondary | ICD-10-CM | POA: Diagnosis not present

## 2023-09-16 DIAGNOSIS — Z6821 Body mass index (BMI) 21.0-21.9, adult: Secondary | ICD-10-CM | POA: Diagnosis not present

## 2023-09-16 DIAGNOSIS — I10 Essential (primary) hypertension: Secondary | ICD-10-CM | POA: Diagnosis not present

## 2023-09-16 NOTE — Progress Notes (Signed)
Cardiology Office Note:    Date:  09/17/2023   ID:  Kaitlin Villarreal, DOB 09-23-1926, MRN [id number]  PCP:  Annamaria Helling, DO   [facility] Providers Cardiologist:  Norman Herrlich, MD Electrophysiologist:  Will Jorja Loa, MD     Referring MD: Noni Saupe, MD   CC: follow up   History of Present Illness:    Kaitlin Villarreal is a 89 y.o. female with a hx of hypertension, complete heart block s/p Saint Jude dual-chamber PPM on 03/20/2018, chronic diastolic heart failure, ventral septal defect, severe aortic stenosis s/p TAVR 2019, history of TIAs, GERD, CKD stage III, anemia related to CKD, hyperlipidemia.  06/19/2023 device check normal remote check 08/13/2023 echo EF greater than 50%, mild mitral regurgitation, mild aortic regurgitation with bioprosthetic aortic valve functioning normally, severe tricuspid valve regurgitation with RSVP severely elevated at 114 mmHg 11/15/2022 echo EF greater than 70%, mild concentric LVH, RV mildly enlarged, mild mitral valve annular calcification, mild to moderate MR, mild to moderate MS, mild AR. 620 2019 ppm implantation 03/18/2018 TAVR  Most recently evaluated by Dr. Elberta Fortis on 01/18/2023, at this time she was doing well from an EP perspective and she was advised she could follow-up with EP in 12 months.  Remote pacemaker device check on 12/26/2022 had revealed abnormal device interrogation, noise noted on A lead.  Evaluated on 06/18/2023 by general cardiology, she was doing well, weighing herself daily, taking Lasix on occasion for pedal edema.  She was admitted to the hospital on 08/20/2023 following a fall, syncopal event and GI bleed, her aspirin was held for 7 days, she was advised to follow-up with her PCP.  She presents today accompanied by her daughter.  She offers no formal complaints.  After her hospitalization, her Lasix had been held.  Her legs been very edematous and they restarted her Lasix yesterday.  She does have pedal  edema today however states it is calm down substantially.  She denies chest pain, palpitations, dyspnea, pnd, orthopnea, n, v, dizziness, syncope, weight gain, or early satiety.    Past Medical History:  Diagnosis Date   Acute on chronic diastolic heart failure (HCC) 03/18/2018   Aortic stenosis 10/14/2014   Formatting of this note might be different from the original.  moderate by echo 11/2015   Arthritis    Bilateral renal masses 07/26/2017   Bone spur of toe of left foot    Chronic diastolic (congestive) heart failure (HCC)    Chronic diastolic heart failure (HCC) 03/18/2018   CKD (chronic kidney disease) stage 3, GFR 30-59 ml/min (HCC) 07/26/2017   Essential hypertension    GERD (gastroesophageal reflux disease)    HLD (hyperlipidemia)    Hyperlipidemia 11/25/2016   Pacemaker 09/02/2018   Pancreatic lesion 04/17/2018   Pancreatic mass    a. 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 03/2018: s/p TAVR by Excell Seltzer and Dr. Cornelius Moras   Stage 3 chronic kidney disease (HCC)    TIA (transient ischemic attack)     a. 1992   VSD (ventricular septal defect)     Past Surgical History:  Procedure Laterality Date   ABDOMINAL HYSTERECTOMY     APPENDECTOMY     HERNIA  REPAIR     INTRAOPERATIVE TRANSTHORACIC ECHOCARDIOGRAM N/A 03/18/2018   Procedure: INTRAOPERATIVE TRANSTHORACIC ECHOCARDIOGRAM;  Surgeon: Tonny Bollman, MD;  Location: [facility] OR;  Service: Open Heart Surgery;  Laterality: N/A;   PACEMAKER IMPLANT N/A 03/20/2018   Procedure: PACEMAKER IMPLANT;  Surgeon: Regan Lemming, MD;  Location: [facility] INVASIVE CV LAB;  Service: Cardiovascular;  Laterality: N/A;   RIGHT HEART CATH N/A 03/20/2018   Procedure: RIGHT HEART CATH;  Surgeon: Tonny Bollman, MD;   Location: [facility] INVASIVE CV LAB;  Service: Cardiovascular;  Laterality: N/A;   RIGHT/LEFT HEART CATH AND CORONARY ANGIOGRAPHY N/A 02/06/2018   Procedure: RIGHT/LEFT HEART CATH AND CORONARY ANGIOGRAPHY;  Surgeon: Tonny Bollman, MD;  Location: [facility] INVASIVE CV LAB;  Service: Cardiovascular;  Laterality: N/A;   TONSILLECTOMY     TRANSCATHETER AORTIC VALVE REPLACEMENT, TRANSFEMORAL N/A 03/18/2018   Procedure: TRANSCATHETER AORTIC VALVE REPLACEMENT, TRANSFEMORAL;  Surgeon: Tonny Bollman, MD;  Location: [facility] OR;  Service: Open Heart Surgery;  Laterality: N/A;   WISDOM TOOTH EXTRACTION      Current Medications: Current Meds  Medication Sig   acetaminophen (TYLENOL) 650 MG CR tablet Take 650-1,300 mg by mouth every 8 (eight) hours as needed for pain.   allopurinol (ZYLOPRIM) 100 MG tablet Take 100 mg by mouth daily.   amLODipine (NORVASC) 5 MG tablet Take 5 mg by mouth daily.   aspirin EC 81 MG tablet Take 1 tablet (81 mg total) by mouth daily.   Carboxymethylcellulose Sod PF (THERATEARS PF) 0.25 % SOLN Place 1 drop into both eyes in the morning and at bedtime.   Cyanocobalamin (B-12) 2500 MCG TABS Take 2,500 mcg by mouth daily.    estradiol (ESTRACE) 1 MG tablet Take 0.5 mg by mouth daily.   fluticasone (FLONASE) 50 MCG/ACT nasal spray Place 2 sprays into both nostrils daily as needed for allergies or rhinitis.   furosemide (LASIX) 20 MG tablet Take 1 tablet (20 mg total) by mouth every Monday, Wednesday, and Friday.   lisinopril (ZESTRIL) 40 MG tablet Take 40 mg by mouth daily.   metoprolol tartrate (LOPRESSOR) 50 MG tablet Take 1 tablet by mouth twice daily   Multiple Vitamins-Minerals (MULTI-VITAMIN GUMMIES) CHEW Chew 2 tablets by mouth daily.    Omega-3 Fatty Acids (FISH OIL) 500 MG CAPS Take 3 capsules by mouth daily.   pantoprazole (PROTONIX) 40 MG tablet Take 1 tablet (40 mg total) by mouth daily.   Vitamin D, Ergocalciferol, (DRISDOL) 1.25 MG (50000 UT) CAPS capsule Take 50,000 Units by mouth  See admin instructions. Take 50,000 units by mouth 2 times a week- Mondays and Fridays     Allergies:   Clarithromycin, Latex, Levofloxacin, Azithromycin, Diphenhydramine hcl, and Ivp dye [iodinated contrast media]   Social History   Socioeconomic History   Marital status: Married    Spouse name: Not on file   Number of children: Not on file   Years of education: Not on file   Highest education level: Not on file  Occupational History   Not on file  Tobacco Use   Smoking status: Never    Passive exposure: Never   Smokeless tobacco: Never  Vaping Use   Vaping status: Never Used  Substance and Sexual Activity   Alcohol use: No   Drug use: No   Sexual activity: Not on file  Other Topics Concern   Not on file  Social History Narrative   Not on file   Social Drivers of Health   Financial Resource Strain: Not on file  Food Insecurity: No Food Insecurity (08/21/2023)   Hunger Vital Sign    Worried About Running Out of Food in the Last Year: Never true    Ran Out of Food in the Last Year: Never true  Transportation Needs: No Transportation Needs (08/21/2023)   PRAPARE - Administrator, Civil Service (Medical): No    Lack of Transportation (Non-Medical): No  Physical Activity: Not on file  Stress: Not on file  Social Connections: Not on file     Family History: The patient's family history includes Congenital heart disease in her brother; Heart attack in her brother; Heart disease in her mother; Uterine cancer in her sister.  ROS:   Please see the history of present illness.     All other systems reviewed and are negative.  EKGs/Labs/Other Studies Reviewed:    The following studies were reviewed today: Cardiac Studies & Procedures   CARDIAC CATHETERIZATION  CARDIAC CATHETERIZATION 03/20/2018  Narrative 1.  No significant left to right shunting based on absence of a significant O2 step up from the SVC to the pulmonary artery 2.  Elevated right and left  heart intracardiac filling pressures consistent with acute on chronic diastolic heart failure  Plan: Serial echo follow-up of the patient's small perimembranous VSD, IV diuresis and medical therapy for treatment of congestive heart failure, permanent pacemaker placement per Dr. Elberta Fortis to follow   CARDIAC CATHETERIZATION  CARDIAC CATHETERIZATION 02/06/2018  Narrative  Ost 1st Diag lesion is 50% stenosed.  Prox Cx lesion is 40% stenosed.  There is severe aortic valve stenosis.  1.  Calcified coronary arteries without significant stenosis.  Mild proximal left circumflex stenosis, minimal irregularity of the RCA, minimal irregularity of the LAD and moderate stenosis of an ostial diagonal 2.  Severely calcified aortic valve with known severe aortic stenosis by noninvasive assessment 3.  Normal right heart hemodynamics with preserved cardiac output and normal intracardiac filling pressures  Recommendations: Continue multidisciplinary evaluation for TAVR.  Medical therapy for mild nonobstructive CAD.  Findings Coronary Findings Diagnostic  Dominance: Right  Left Main There is mild diffuse disease throughout the vessel. The vessel is moderately calcified. The left main is calcified with no significant stenosis  Left Anterior Descending The vessel exhibits minimal luminal irregularities. The LAD is patent throughout.  The distal vessel has multiple angulated segments but there are no stenoses.  The origin of the first diagonal branch has 50% stenosis.  First Diagonal Branch Ost 1st Diag lesion is 50% stenosed.  Left Circumflex There is mild diffuse disease throughout the vessel. Prox Cx lesion is 40% stenosed. The lesion is moderately calcified.  First Obtuse Marginal Branch Vessel is moderate in size. There is mild disease in the vessel.  Second Obtuse Marginal Branch There is mild disease in the vessel.  Right Coronary Artery Vessel is moderate in size. There is mild diffuse  disease throughout the vessel. The origin of the RCA is calcified around the right cusp.  The vessel has moderate diffuse calcification.  The vessel is widely patent with minor diffuse irregularities noted but no significant stenosis.  Intervention  No interventions have been documented.   STRESS TESTS  ECHOCARDIOGRAM STRESS TEST 01/08/2018  Narrative *[facility]* [address] [place], [place] [address] [phone]  ------------------------------------------------------------------- Stress Echocardiography  Patient:    Dachelle, Lilja MR #:       [id number] Study Date: 01/08/2018 Gender:     F Age:        65  Height:     152.4 cm Weight:     54.5 kg BSA:        1.53 m^2 Pt. Status: Room:  ATTENDING    Default, Provider [phone] [facility]     Norman Herrlich, MD REFERRING    Norman Herrlich, MD REFERRING    Noni Saupe PERFORMING   [facility] SONOGRAPHER  Sinda Du, RDCS  cc:  -------------------------------------------------------------------  ------------------------------------------------------------------- Indications:      Aortic stenosis 424.1.  ------------------------------------------------------------------- History:   PMH:   Murmur.  Aortic valve disease.  Risk factors: Hypertension.  ------------------------------------------------------------------- Study Conclusions  - Aortic valve: Valve area (VTI): 0.7 cm^2. Valve area (Vmax): 0.76 cm^2. Valve area (Vmean): 0.76 cm^2. - Stress: There was a normal resting blood pressure with a hypotensive response to stress. Functional capacity was decreased (greater than 40%). - Stress ECG conclusions: There were no stress arrhythmias or conduction abnormalities. The stress ECG was non-diagnostic.  ------------------------------------------------------------------- Labs, prior tests, procedures, and surgery: ECG.      Abnormal.  ------------------------------------------------------------------- Study data:   Study status:  Routine.  Consent:  The risks, benefits, and alternatives to the procedure were explained to the patient and informed consent was obtained.  Procedure:  Initial setup. The patient was brought to the laboratory. A baseline ECG was recorded. Surface ECG leads and automatic cuff blood pressure measurements were monitored. Treadmill exercise testing was performed using the modified Bruce protocol. The patient exercised for 4.5 min, to a maximal work rate of 3.4 mets. Exercise was terminated due to fatigue. Transthoracic stress echocardiography for assessment of valvular function. Image quality was adequate. Images were captured at baseline and peak exercise.  Study completion:  The patient tolerated the procedure well. There were no complications.          Modified Bruce protocol. Stress echocardiography.  Birthdate:  Patient birthdate: 07-25-1926.  Age: Patient is 89 yr old.  Sex:  Gender: female.    BMI: 23.5 kg/m^2. Blood pressure:     181/72  Patient status:  Outpatient.  Study date:  Study date: 01/08/2018. Study time: 11:35 AM.  -------------------------------------------------------------------  ------------------------------------------------------------------- Aortic valve:  Resting gradient .  Post stress: Peak stress velocity 376 cm/s. mean velocity 297cm/s. Mean gradient 39 mmHG.  Doppler:     VTI ratio of LVOT to aortic valve: 0.22. Valve area (VTI): 0.7 cm^2. Indexed valve area (VTI): 0.46 cm^2/m^2. Peak velocity ratio of LVOT to aortic valve: 0.24. Valve area (Vmax): 0.76 cm^2. Indexed valve area (Vmax): 0.5 cm^2/m^2. Mean velocity ratio of LVOT to aortic valve: 0.24. Valve area (Vmean): 0.76 cm^2. Indexed valve area (Vmean): 0.5 cm^2/m^2. Resting mean gradient (S): 44 mm Hg. Peak gradient (S): 75 mm  Hg.  ------------------------------------------------------------------- Mitral valve:   Doppler:     Peak gradient (D): 9 mm Hg.  ------------------------------------------------------------------- Baseline ECG:  Normal.  Normal sinus rhythm.  Nonspecific ST changes.  ------------------------------------------------------------------- Stress protocol:  +---------------------+---+------------+--------+ !Stage                !HR !BP (mmHg)   !Symptoms! +---------------------+---+------------+--------+ !Baseline             !70 !181/72 (108)!None    ! +---------------------+---+------------+--------+ !Stage 0              !105!------------!--------! +---------------------+---+------------+--------+ !Stage 1/2            !116!------------!--------! +---------------------+---+------------+--------+ !Immediate post stress!113!------------!--------! +---------------------+---+------------+--------+ !Recovery; 1 min      !88 !------------!--------! +---------------------+---+------------+--------+ !Recovery; 2 min      !  82 !------------!--------! +---------------------+---+------------+--------+  ------------------------------------------------------------------- Stress results:   Maximal heart rate during stress was 116 bpm (89% of maximal predicted heart rate). The maximal predicted heart rate was 131 bpm.The target heart rate was achieved. The heart rate response to stress was normal. There was a normal resting blood pressure with a hypotensive response to stress. The rate-pressure product for the peak heart rate and blood pressure was 69629 mm Hg/min.  The patient experienced no chest pain during stress. Functional capacity was decreased (greater than 40%).  ------------------------------------------------------------------- Stress ECG:  There were no stress arrhythmias or conduction abnormalities.  The stress ECG was  non-diagnostic.  ------------------------------------------------------------------- Measurements  Left ventricle                            Value          Reference LV ID, ED, PLAX chordal           (L)     33.4  mm       43 - 52 LV ID, ES, PLAX chordal           (L)     17.9  mm       23 - 38 LV fx shortening, PLAX chordal            46    %        >=29 LV PW thickness, ED                       10.2  mm       --------- IVS/LV PW ratio, ED               (H)     1.49           <=1.3 Stroke volume, 2D                         84    ml       --------- Stroke volume/bsa, 2D                     55    ml/m^2   ---------  Ventricular septum                        Value          Reference IVS thickness, ED                         15.2  mm       ---------  LVOT                                      Value          Reference LVOT ID, S                                20    mm       --------- LVOT area                                 3.14  cm^2     --------- LVOT peak  velocity, S                     105   cm/s     --------- LVOT mean velocity, S                     75.8  cm/s     --------- LVOT VTI, S                               26.8  cm       ---------  Aortic valve                              Value          Reference Aortic valve peak velocity, S             434   cm/s     --------- Aortic valve mean velocity, S             312   cm/s     --------- Aortic valve VTI, S                       121   cm       --------- Aortic mean gradient, S                   40    mm Hg    --------- Aortic peak gradient, S                   75    mm Hg    --------- VTI ratio, LVOT/AV                        0.22           --------- Aortic valve area, VTI                    0.7   cm^2     --------- Aortic valve area/bsa, VTI                0.46  cm^2/m^2 --------- Velocity ratio, peak, LVOT/AV             0.24           --------- Aortic valve area, peak velocity          0.76  cm^2     --------- Aortic valve  area/bsa, peak               0.5   cm^2/m^2 --------- velocity Velocity ratio, mean, LVOT/AV             0.24           --------- Aortic valve area, mean velocity          0.76  cm^2     --------- Aortic valve area/bsa, mean               0.5   cm^2/m^2 --------- velocity  Aorta                                     Value          Reference Aortic root ID, ED  27    mm       ---------  Left atrium                               Value          Reference LA ID, A-P, ES                            36    mm       --------- LA ID/bsa, A-P                    (H)     2.36  cm/m^2   <=2.2  Mitral valve                              Value          Reference Mitral E-wave peak velocity               147   cm/s     --------- Mitral A-wave peak velocity               118   cm/s     --------- Mitral deceleration time                  194   ms       150 - 230 Mitral peak gradient, D                   9     mm Hg    --------- Mitral E/A ratio, peak                    1.2            ---------  Legend: (L)  and  (H)  mark values outside specified reference range.  ------------------------------------------------------------------- Prepared and Electronically Authenticated by  Norman Herrlich, MD 2019-04-10T13:52:39  ECHOCARDIOGRAM  ECHOCARDIOGRAM COMPLETE 08/12/2019  Narrative ECHOCARDIOGRAM REPORT    Patient Name:   MARKIAH SOULSBY Spofford   Date of Exam: 08/12/2019 Medical Rec #:  [id number]     Height:       60.0 in Accession #:    [id number]    Weight:       116.0 lb Date of Birth:  12-01-1925     BSA:          1.48 m Patient Age:    89 years      BP:           130/50 mmHg Patient Gender: F             HR:           73 bpm. Exam Location:  [address]  Procedure: 2D Echo, Cardiac Doppler and Color Doppler  Indications:     Z95.2 Post TAVR evaluation  History:         Patient has prior history of Echocardiogram examinations, most recent 02/04/2019. TAVR. Hypertensive heart  disease. 3rd degree AV block. VSD. Chronic kidney disease.  Sonographer:     Cathie Beams RCS Referring Phys:  [id number] Wille Celeste THOMPSON Diagnosing Phys: Kristeen Miss MD  IMPRESSIONS   1. Left ventricular ejection fraction, by visual estimation, is 60 to 65%. The left ventricle has normal function. There is mildly increased left ventricular hypertrophy.  2. There is a small VSD with left to right shunting present. 3. Global right ventricle has normal systolic function.The right ventricular size is normal. No increase in right ventricular wall thickness. 4. Left atrial size was normal. 5. Right atrial size was normal. 6. The mitral valve is normal in structure. Mild mitral valve regurgitation. Mild mitral stenosis. 7. The tricuspid valve is grossly normal. Tricuspid valve regurgitation mild-moderate. 8. Aortic valve regurgitation is not visualized. 9. The pulmonic valve was grossly normal. Pulmonic valve regurgitation is not visualized. 10. Severely elevated pulmonary artery systolic pressure. 11. The atrial septum is grossly normal.  FINDINGS Left Ventricle: Left ventricular ejection fraction, by visual estimation, is 60 to 65%. The left ventricle has normal function. There is mildly increased left ventricular hypertrophy. There is a small VSD with left to right shunting present.  Right Ventricle: The right ventricular size is normal. No increase in right ventricular wall thickness. Global RV systolic function is has normal systolic function. The tricuspid regurgitant velocity is 4.69 m/s, and with an assumed right atrial pressure of 10 mmHg, the estimated right ventricular systolic pressure is severely elevated at 98.0 mmHg.  Left Atrium: Left atrial size was normal in size.  Right Atrium: Right atrial size was normal in size  Pericardium: There is no evidence of pericardial effusion.  Mitral Valve: The mitral valve is normal in structure. Mild mitral valve stenosis by  observation. MV peak gradient, 14.0 mmHg. Mild mitral valve regurgitation.  Tricuspid Valve: The tricuspid valve is grossly normal. Tricuspid valve regurgitation mild-moderate.  Aortic Valve: The aortic valve has been repaired/replaced. Aortic valve regurgitation is not visualized. Aortic valve mean gradient measures 6.5 mmHg. Aortic valve peak gradient measures 13.4 mmHg. Aortic valve area, by VTI measures 2.36 cm. Homograft aortic valve valve is present in the aortic position.  Pulmonic Valve: The pulmonic valve was grossly normal. Pulmonic valve regurgitation is not visualized.  Aorta: The aortic root and ascending aorta are structurally normal, with no evidence of dilitation.  IAS/Shunts: The atrial septum is grossly normal.    LEFT VENTRICLE PLAX 2D LVIDd:         3.50 cm  Diastology LVIDs:         1.90 cm  LV e' lateral:   8.49 cm/s LV PW:         1.20 cm  LV E/e' lateral: 18.7 LV IVS:        1.30 cm  LV e' medial:    5.87 cm/s LVOT diam:     2.00 cm  LV E/e' medial:  27.1 LV SV:         40 ml LV SV Index:   26.48 LVOT Area:     3.14 cm   RIGHT VENTRICLE RV Basal diam:  2.20 cm RV S prime:     12.40 cm/s TAPSE (M-mode): 1.9 cm RVSP:           91.0 mmHg  LEFT ATRIUM             Index       RIGHT ATRIUM           Index LA diam:        3.30 cm 2.23 cm/m  RA Pressure: 3.00 mmHg LA Vol (A2C):   50.4 ml 34.03 ml/m RA Area:     11.60 cm LA Vol (A4C):   28.9 ml 19.52 ml/m RA Volume:   23.80 ml  16.07 ml/m LA Biplane Vol: 38.7 ml 26.13 ml/m AORTIC VALVE  AV Area (Vmax):    1.92 cm AV Area (Vmean):   1.97 cm AV Area (VTI):     2.36 cm AV Vmax:           183.00 cm/s AV Vmean:          119.500 cm/s AV VTI:            0.466 m AV Peak Grad:      13.4 mmHg AV Mean Grad:      6.5 mmHg LVOT Vmax:         112.00 cm/s LVOT Vmean:        74.900 cm/s LVOT VTI:          0.350 m LVOT/AV VTI ratio: 0.75  MITRAL VALVE                         TRICUSPID VALVE MV Area (PHT):  2.87 cm              TR Peak grad:   88.0 mmHg MV Peak grad:  14.0 mmHg             TR Vmax:        469.00 cm/s MV Mean grad:  6.0 mmHg              Estimated RAP:  3.00 mmHg MV Vmax:       1.87 m/s              RVSP:           91.0 mmHg MV Vmean:      115.0 cm/s MV VTI:        0.54 m                SHUNTS MV PHT:        76.56 msec            Systemic VTI:  0.35 m MV Decel Time: 264 msec              Systemic Diam: 2.00 cm MV E velocity: 159.00 cm/s 103 cm/s MV A velocity: 142.00 cm/s 70.3 cm/s MV E/A ratio:  1.12        1.5   Kristeen Miss MD Electronically signed by Kristeen Miss MD Signature Date/Time: 08/12/2019/5:28:06 PM    Final (Updated)  TEE  ECHO TEE 03/18/2018  Narrative *[place]* *[facility]* [address] [place], [place] [address] [phone]  ------------------------------------------------------------------- Transesophageal Echocardiography  Patient:    Devlin, Cantrelle MR #:       [id number] Study Date: 03/18/2018 Gender:     F Age:        15 Height:     152.4 cm Weight:     54.4 kg BSA:        1.53 m^2 Pt. Status: Room:       2H24C  ADMITTING    Tressie Stalker, M.D. ATTENDING    Tonny Bollman, MD ORDERING     Tonny Bollman, MD REFERRING    Tonny Bollman, MD PERFORMING   Tobias Alexander, M.D. SONOGRAPHER  Sheralyn Boatman  cc:  ------------------------------------------------------------------- LV EF: 65% -   70%  ------------------------------------------------------------------- Indications:      Aortic stenosis 424.1.  ------------------------------------------------------------------- History:   Risk factors:  Hypertension. Dyslipidemia.  ------------------------------------------------------------------- Study Conclusions  - Left ventricle: Wall thickness was increased in a pattern of moderate LVH. Systolic function was vigorous. The estimated ejection fraction was in the range of  65% to 70%. Wall motion  was normal; there were no regional wall motion abnormalities. - Aortic valve: There was severe regurgitation. Valve area (VTI): 0.77 cm^2. Valve area (Vmax): 0.8 cm^2. Valve area (Vmean): 2.37 cm^2. - Mitral valve: There was mild regurgitation. - Left atrium: No evidence of thrombus in the atrial cavity or appendage. No evidence of thrombus in the atrial cavity or appendage. - Right atrium: No evidence of thrombus in the atrial cavity or appendage.  Impressions:  - This was a periprocedural echocardiogram during a TAVR procedure. TTE was switched to TEE for concern of a VSD and a follow up TEE was done 5 hours later.  Native aortic valve was severely thickened and calcified with severely restricted leaflet openings. Peak/mean transaortic gradients were 68/41 mmHg consistent with severe aortic stenosis. A 23 mm Edwards-SAPIEN 3 valve was successfully deployed in the aortic position. Post-deployment gradients were 12/4 mmHg. No paravalvular leak. A small perimembranous VSD was noted with small left to right shunting. A TEE probe was inserted and confirmed the diagnosis. There is trivial pericardial effusion. Mild mitral and tricuspid regurgitation.  5 hours later a limited TTE was performed. It showed unchanged small VSD, stable aortic valve with normal unchanged gradients. No paravalvular leak. Trivial pericardial effusion.  ------------------------------------------------------------------- Study data:   Study status:  Routine.  Consent:  The risks, benefits, and alternatives to the procedure were explained to the patient and informed consent was obtained.  Procedure:  Study started as a limited transthoracic, then TEE probe was inserted to evaluate for VSD. The patient reported no pain pre or post test. Initial setup. The patient was brought to the laboratory. Surface ECG leads were monitored. Sedation. Conscious sedation was administered by anesthesiology staff.  Transesophageal echocardiography. Topical anesthesia was obtained using viscous lidocaine. An adult multiplane transesophageal probe was inserted by the anesthesiologistwithout difficulty. Image quality was adequate.  Study completion:  The patient tolerated the procedure well. There were no complications.          Diagnostic transesophageal echocardiography.  2D and color Doppler. Birthdate:  Patient birthdate: 1926/08/02.  Age:  Patient is 89 yr old.  Sex:  Gender: female.    BMI: 23.4 kg/m^2.  Blood pressure: 204/61  Patient status:  Inpatient.  Study date:  Study date: 03/18/2018. Study time: 07:34 AM.  Location:  Operating room.  -------------------------------------------------------------------  ------------------------------------------------------------------- Left ventricle:   Wall thickness was increased in a pattern of moderate LVH.   Systolic function was vigorous. The estimated ejection fraction was in the range of 65% to 70%. Wall motion was normal; there were no regional wall motion abnormalities.  ------------------------------------------------------------------- Aortic valve:   Trileaflet; severely thickened, severely calcified leaflets. Cusp separation was normal.  Doppler:  There was severe regurgitation.    VTI ratio of LVOT to aortic valve: 1.2. Valve area (VTI): 0.77 cm^2. Indexed valve area (VTI): 0.5 cm^2/m^2. Peak velocity ratio of LVOT to aortic valve: 0.92. Valve area (Vmax): 0.8 cm^2. Indexed valve area (Vmax): 0.52 cm^2/m^2. Mean velocity ratio of LVOT to aortic valve: 0.93. Valve area (Vmean): 2.37 cm^2. Indexed valve area (Vmean): 1.55 cm^2/m^2.    Mean gradient (S): 3 mm Hg. Peak gradient (S): 6 mm Hg.  ------------------------------------------------------------------- Aorta:  There was no atheroma. There was no evidence for dissection. Aortic root: The aortic root was not dilated. Ascending aorta: The ascending aorta was normal in size. Aortic  arch: The aortic arch was normal in size. Descending aorta: The descending aorta was normal in size.  -------------------------------------------------------------------  Mitral valve:   Mildly thickened leaflets . Leaflet separation was normal.  Doppler:  There was mild regurgitation.  ------------------------------------------------------------------- Left atrium:  The atrium was normal in size.  No evidence of thrombus in the atrial cavity or appendage.  No evidence of thrombus in the atrial cavity or appendage. The appendage was morphologically a left appendage, multilobulated, and of normal size. Emptying velocity was normal.  ------------------------------------------------------------------- Right ventricle:  The cavity size was normal. Wall thickness was normal. Systolic function was normal.  ------------------------------------------------------------------- Pulmonic valve:    Structurally normal valve.  ------------------------------------------------------------------- Tricuspid valve:   Structurally normal valve.   Leaflet separation was normal.  Doppler:  There was mild regurgitation.  ------------------------------------------------------------------- Pulmonary artery:   The main pulmonary artery was normal-sized.  ------------------------------------------------------------------- Right atrium:  The atrium was normal in size.  No evidence of thrombus in the atrial cavity or appendage. The appendage was morphologically a right appendage.  ------------------------------------------------------------------- Pericardium:  There was no pericardial effusion.  ------------------------------------------------------------------- Measurements  Left ventricle                            Value          Reference LV ID, ED, PLAX chordal           (L)     28.7  mm       43 - 52 LV ID, ES, PLAX chordal           (L)     19.9  mm       23 - 38 LV fx shortening, PLAX chordal             31    %        >=29 LV PW thickness, ED                       14.3  mm       --------- IVS/LV PW ratio, ED                       0.93           <=1.3 Stroke volume, 2D                         76    ml       --------- Stroke volume/bsa, 2D                     50    ml/m^2   --------- LV end-diastolic volume, 1-p A4C          44    ml       --------- LV ejection fraction, 1-p A4C             66    %        --------- LV end-diastolic volume/bsa, 1-p          29    ml/m^2   --------- A4C LV end-diastolic volume, 2-p              48    ml       --------- LV end-systolic volume, 2-p               16    ml       --------- LV ejection fraction, 2-p  68    %        --------- Stroke volume, 2-p                        32    ml       --------- LV end-diastolic volume/bsa, 2-p          31    ml/m^2   --------- LV end-systolic volume/bsa, 2-p           10    ml/m^2   --------- Stroke volume/bsa, 2-p                    21.1  ml/m^2   ---------  Ventricular septum                        Value          Reference IVS thickness, ED                         13.3  mm       ---------  LVOT                                      Value          Reference LVOT ID, S                                18    mm       --------- LVOT area                                 2.54  cm^2     --------- LVOT peak velocity, S                     109   cm/s     --------- LVOT mean velocity, S                     77.3  cm/s     --------- LVOT VTI, S                               30.1  cm       ---------  Aortic valve                              Value          Reference Aortic valve peak velocity, S             118   cm/s     --------- Aortic valve mean velocity, S             82.7  cm/s     --------- Aortic valve VTI, S                       25    cm       --------- Aortic mean gradient, S                   3  mm Hg    --------- Aortic peak gradient, S                   6     mm Hg    --------- VTI ratio,  LVOT/AV                        1.2            --------- Aortic valve area, VTI                    0.77  cm^2     --------- Aortic valve area/bsa, VTI                0.5   cm^2/m^2 --------- Velocity ratio, peak, LVOT/AV             0.92           --------- Aortic valve area, peak velocity          0.8   cm^2     --------- Aortic valve area/bsa, peak               0.52  cm^2/m^2 --------- velocity Velocity ratio, mean, LVOT/AV             0.93           --------- Aortic valve area, mean velocity          2.37  cm^2     --------- Aortic valve area/bsa, mean               1.55  cm^2/m^2 --------- velocity  Aorta                                     Value          Reference Aortic root ID, ED                        27    mm       --------- Ascending aorta ID, A-P, S                29    mm       ---------  Left atrium                               Value          Reference LA ID, A-P, ES                            24    mm       --------- LA ID/bsa, A-P                            1.57  cm/m^2   <=2.2  Legend: (L)  and  (H)  mark values outside specified reference range.  ------------------------------------------------------------------- Prepared and Electronically Authenticated by  Tobias Alexander, M.D. 2019-06-18T16:24:45   CT SCANS  CT CORONARY MORPH W/CTA COR W/SCORE 02/20/2018  Addendum 02/20/2018  6:32 PM ADDENDUM REPORT: 02/20/2018 18:29  CLINICAL DATA:  89 year old female with severe aortic stenosis being evaluated for a TAVR procedure.  EXAM: Cardiac TAVR CT  TECHNIQUE: The patient was scanned on  a [employer]. A 120 kV retrospective scan was triggered in the descending thoracic aorta at 111 HU's. Gantry rotation speed was 250 msecs and collimation was .6 mm. No beta blockade or nitro were given. The 3D data set was reconstructed in 5% intervals of the R-R cycle. Systolic and diastolic phases were analyzed on a dedicated work station using MPR, MIP and VRT  modes. The patient received 80 cc of contrast.  FINDINGS: Aortic Valve: Trileaflet, severely thickened, moderately calcified aortic valve with mild calcifications extending asymmetrically into the LVOT under the right and left coronary cusps.  Aorta: Normal size with moderate diffuse calcifications and atherosclerosis, no dissection.  Sinotubular Junction: 24 x 24 mm  Ascending Thoracic Aorta: 29 x 29 mm  Aortic Arch: 23 x 23 mm  Descending Thoracic Aorta: 19 x 19 mm  Sinus of Valsalva Measurements:  Non-coronary: 31 mm  Right -coronary: 30 mm  Left -coronary: 29 mm  Sinus of Valsalva Height:  Right -coronary: 22 mm  Left -coronary: 21 mm  Coronary Artery Height above Annulus:  Left Main: 17 mm  Right Coronary: 18 mm  Virtual Basal Annulus Measurements:  Maximum/Minimum Diameter: 22.7 x 18.5 mm  Mean Diameter: 20.4 mm  Perimeter: 65.1 mm  Area: 326 mm2  Optimum Fluoroscopic Angle for Delivery: LAO 24 CAU 20  IMPRESSION: 1. Trileaflet, severely thickened, moderately calcified aortic valve with mild calcifications extending asymmetrically into the LVOT under the right and left coronary cusps. Annular measurements suitable for delivery of a 20 mm Edwards-SAPIEN 3 valve or a 26 mm Medtronic Evolut R valve.  2. Sufficient coronary to annulus distance.  3. Optimum Fluoroscopic Angle for Delivery: LAO 24 CAU 20  4. No thrombus in the left atrial appendage.   Electronically Signed By: Tobias Alexander On: 02/20/2018 18:29  Narrative EXAM: OVER-READ INTERPRETATION  CT CHEST  The following report is an over-read performed by radiologist Dr. Trudie Reed of [facility] on 02/20/2018. This over-read does not include interpretation of cardiac or coronary anatomy or pathology. The coronary calcium score/coronary CTA interpretation by the cardiologist is attached.  COMPARISON:  None.  FINDINGS: Extracardiac findings will be described  under separate dictation for contemporaneously obtained CTA chest, abdomen and pelvis.  IMPRESSION: Please see separate dictation for contemporaneously obtained CTA chest, abdomen and pelvis dated 02/20/2018 for full description of relevant extracardiac findings.  Electronically Signed: By: Trudie Reed M.D. On: 02/20/2018 15:14          EKG:  EKG is not ordered today.   Recent Labs: 12/10/2022: NT-Pro BNP 4,407 09/02/2023: ALT 15; BUN 28; Creatinine 1.64; Hemoglobin 10.6; Platelet Count 215; Potassium 4.4; Sodium 142  Recent Lipid Panel    Component Value Date/Time   CHOL 217 (H) 04/10/2021 1357   TRIG 180 (H) 04/10/2021 1357   HDL 78 04/10/2021 1357   CHOLHDL 2.8 04/10/2021 1357   LDLCALC 108 (H) 04/10/2021 1357     Risk Assessment/Calculations:      HYPERTENSION CONTROL Vitals:   09/17/23 1015 09/17/23 1144  BP: (!) 174/55 (!) 152/48    The patient's blood pressure is elevated above target today.  In order to address the patient's elevated BP: Blood pressure will be monitored at home to determine if medication changes need to be made.            Physical Exam:    VS:  BP (!) 152/48   Pulse 60   Ht 5' (1.524 m)   Wt 102  lb (46.3 kg)   SpO2 97%   BMI 19.92 kg/m     Wt Readings from Last 3 Encounters:  09/17/23 102 lb (46.3 kg)  06/18/23 101 lb 12.8 oz (46.2 kg)  06/12/23 102 lb (46.3 kg)     GEN: Appears younger than stated age, thin but well nourished, well developed in no acute distress HEENT: Normal NECK: No JVD; No carotid bruits LYMPHATICS: No lymphadenopathy CARDIAC: RRR, 3/6 systolic murmur, rubs, gallops RESPIRATORY:  Clear to auscultation without rales, wheezing or rhonchi  ABDOMEN: Soft, non-tender, non-distended MUSCULOSKELETAL: Trace pedal edema left greater than right; No deformity  SKIN: Warm and dry NEUROLOGIC:  Alert and oriented x 3 PSYCHIATRIC:  Normal affect   ASSESSMENT:    1. Chronic heart failure with preserved  ejection fraction (HCC)   2. Essential hypertension   3. Severe aortic stenosis   4. S/P AVR   5. Anemia in chronic kidney disease, unspecified CKD stage   6. Presence of cardiac pacemaker     PLAN:    In order of problems listed above:  Chronic diastolic heart failure -most recent echo 08/2023 revealed revealed an EF greater than 50%, NYHA class I, euvolemic.  Continue metoprolol 50 mg daily, continue lisinopril 40 mg daily.  Continue to weigh daily, we will restart her Lasix low-dose Monday Wednesday Friday--her daughter is aware if this is too much for her to call us and we will decrease this to Lasix 10 mg Monday Wednesday Friday.  Most recent creatinine is stable at 1.64, potassium 4.4.  Complete AV block/s/p pacemaker - followed by EP, will see them in ~ 5months.   S/P TAVR - 3/6 murmur noted, most recent echo revealed mild AR.   HTN -blood pressure is mildly elevated in the office today however I think we can allow for permissive hypertension secondary to age and frailty and propensity to falls.   Stage III CKD-careful titration of antihypertensive agents and diuretics.  She will see Dr. Melvyn Neth in a week for blood work, will ask if he can check CMET at that time as well.   Disposition-resume Lasix 20 mg Monday Wednesday Friday, return in 3 months.       Medication Adjustments/Labs and Tests Ordered: Current medicines are reviewed at length with the patient today.  Concerns regarding medicines are outlined above.  No orders of the defined types were placed in this encounter.  Meds ordered this encounter  Medications   furosemide (LASIX) 20 MG tablet    Sig: Take 1 tablet (20 mg total) by mouth every Monday, Wednesday, and Friday.    Dispense:  36 tablet    Refill:  3    Patient Instructions  Medication Instructions:  Start  taking Lasix 20 mg Monday, Wednesday, and Friday. *If you need a refill on your cardiac medications before your next appointment, please call your  pharmacy*   Lab Work: None Ordered If you have labs (blood work) drawn today and your tests are completely normal, you will receive your results only by: MyChart Message (if you have MyChart) OR A paper copy in the mail If you have any lab test that is abnormal or we need to change your treatment, we will call you to review the results.   Testing/Procedures: None Ordered   Follow-Up: At [facility], you and your health needs are our priority.  As part of our continuing mission to provide you with exceptional heart care, we have created designated Provider Care Teams.  These Care  Teams include your primary Cardiologist (physician) and Advanced Practice Providers (APPs -  Physician Assistants and Nurse Practitioners) who all work together to provide you with the care you need, when you need it.  We recommend signing up for the patient portal called "MyChart".  Sign up information is provided on this After Visit Summary.  MyChart is used to connect with patients for Virtual Visits (Telemedicine).  Patients are able to view lab/test results, encounter notes, upcoming appointments, etc.  Non-urgent messages can be sent to your provider as well.   To learn more about what you can do with MyChart, go to [email or web address].    Your next appointment:   3 month follow up with Dr. Dulce Sellar     Signed, Flossie Dibble, NP  09/17/2023 11:50 AM    [facility]

## 2023-09-17 ENCOUNTER — Encounter: Payer: Self-pay | Admitting: Cardiology

## 2023-09-17 ENCOUNTER — Ambulatory Visit: Payer: Medicare Other | Attending: Cardiology | Admitting: Cardiology

## 2023-09-17 VITALS — BP 152/48 | HR 60 | Ht 60.0 in | Wt 102.0 lb

## 2023-09-17 DIAGNOSIS — D631 Anemia in chronic kidney disease: Secondary | ICD-10-CM | POA: Diagnosis present

## 2023-09-17 DIAGNOSIS — L98499 Non-pressure chronic ulcer of skin of other sites with unspecified severity: Secondary | ICD-10-CM | POA: Insufficient documentation

## 2023-09-17 DIAGNOSIS — Z952 Presence of prosthetic heart valve: Secondary | ICD-10-CM

## 2023-09-17 DIAGNOSIS — I10 Essential (primary) hypertension: Secondary | ICD-10-CM | POA: Diagnosis present

## 2023-09-17 DIAGNOSIS — Z950 Presence of cardiac pacemaker: Secondary | ICD-10-CM | POA: Diagnosis present

## 2023-09-17 DIAGNOSIS — I5032 Chronic diastolic (congestive) heart failure: Secondary | ICD-10-CM | POA: Diagnosis present

## 2023-09-17 DIAGNOSIS — I350 Nonrheumatic aortic (valve) stenosis: Secondary | ICD-10-CM | POA: Diagnosis present

## 2023-09-17 DIAGNOSIS — N189 Chronic kidney disease, unspecified: Secondary | ICD-10-CM | POA: Diagnosis present

## 2023-09-17 HISTORY — DX: Non-pressure chronic ulcer of skin of other sites with unspecified severity: L98.499

## 2023-09-17 MED ORDER — FUROSEMIDE 20 MG PO TABS: 20.0000 mg | ORAL_TABLET | ORAL | 3 refills | Status: AC

## 2023-09-17 NOTE — Patient Instructions (Signed)
Medication Instructions:  Start  taking Lasix 20 mg Monday, Wednesday, and Friday. *If you need a refill on your cardiac medications before your next appointment, please call your pharmacy*   Lab Work: None Ordered If you have labs (blood work) drawn today and your tests are completely normal, you will receive your results only by: MyChart Message (if you have MyChart) OR A paper copy in the mail If you have any lab test that is abnormal or we need to change your treatment, we will call you to review the results.   Testing/Procedures: None Ordered   Follow-Up: At [facility], you and your health needs are our priority.  As part of our continuing mission to provide you with exceptional heart care, we have created designated Provider Care Teams.  These Care Teams include your primary Cardiologist (physician) and Advanced Practice Providers (APPs -  Physician Assistants and Nurse Practitioners) who all work together to provide you with the care you need, when you need it.  We recommend signing up for the patient portal called "MyChart".  Sign up information is provided on this After Visit Summary.  MyChart is used to connect with patients for Virtual Visits (Telemedicine).  Patients are able to view lab/test results, encounter notes, upcoming appointments, etc.  Non-urgent messages can be sent to your provider as well.   To learn more about what you can do with MyChart, go to [email or web address].    Your next appointment:   3 month follow up with Dr. Dulce Sellar

## 2023-09-18 ENCOUNTER — Ambulatory Visit (INDEPENDENT_AMBULATORY_CARE_PROVIDER_SITE_OTHER): Payer: Medicare Other

## 2023-09-18 DIAGNOSIS — D62 Acute posthemorrhagic anemia: Secondary | ICD-10-CM | POA: Diagnosis not present

## 2023-09-18 DIAGNOSIS — D631 Anemia in chronic kidney disease: Secondary | ICD-10-CM | POA: Diagnosis not present

## 2023-09-18 DIAGNOSIS — K5731 Diverticulosis of large intestine without perforation or abscess with bleeding: Secondary | ICD-10-CM | POA: Diagnosis not present

## 2023-09-18 DIAGNOSIS — I130 Hypertensive heart and chronic kidney disease with heart failure and stage 1 through stage 4 chronic kidney disease, or unspecified chronic kidney disease: Secondary | ICD-10-CM | POA: Diagnosis not present

## 2023-09-18 DIAGNOSIS — N1832 Chronic kidney disease, stage 3b: Secondary | ICD-10-CM | POA: Diagnosis not present

## 2023-09-18 DIAGNOSIS — I5032 Chronic diastolic (congestive) heart failure: Secondary | ICD-10-CM | POA: Diagnosis not present

## 2023-09-18 DIAGNOSIS — I442 Atrioventricular block, complete: Secondary | ICD-10-CM | POA: Diagnosis not present

## 2023-09-18 LAB — CUP PACEART REMOTE DEVICE CHECK
Battery Remaining Longevity: 67 mo
Battery Remaining Percentage: 56 %
Battery Voltage: 2.99 V
Brady Statistic AP VP Percent: 1 %
Brady Statistic AP VS Percent: 36 %
Brady Statistic AS VP Percent: 1 %
Brady Statistic AS VS Percent: 64 %
Brady Statistic RA Percent Paced: 34 %
Brady Statistic RV Percent Paced: 1 %
Date Time Interrogation Session: 20241218020013
Implantable Lead Connection Status: 753985
Implantable Lead Connection Status: 753985
Implantable Lead Implant Date: 20190620
Implantable Lead Implant Date: 20190620
Implantable Lead Location: 753859
Implantable Lead Location: 753860
Implantable Pulse Generator Implant Date: 20190620
Lead Channel Impedance Value: 330 Ohm
Lead Channel Impedance Value: 410 Ohm
Lead Channel Pacing Threshold Amplitude: 0.75 V
Lead Channel Pacing Threshold Amplitude: 0.75 V
Lead Channel Pacing Threshold Pulse Width: 0.5 ms
Lead Channel Pacing Threshold Pulse Width: 0.5 ms
Lead Channel Sensing Intrinsic Amplitude: 3.1 mV
Lead Channel Sensing Intrinsic Amplitude: 7.8 mV
Lead Channel Setting Pacing Amplitude: 1 V
Lead Channel Setting Pacing Amplitude: 2 V
Lead Channel Setting Pacing Pulse Width: 0.5 ms
Lead Channel Setting Sensing Sensitivity: 2 mV
Pulse Gen Model: 2272
Pulse Gen Serial Number: 9035930

## 2023-09-19 DIAGNOSIS — I130 Hypertensive heart and chronic kidney disease with heart failure and stage 1 through stage 4 chronic kidney disease, or unspecified chronic kidney disease: Secondary | ICD-10-CM | POA: Diagnosis not present

## 2023-09-19 DIAGNOSIS — N1832 Chronic kidney disease, stage 3b: Secondary | ICD-10-CM | POA: Diagnosis not present

## 2023-09-19 DIAGNOSIS — I5032 Chronic diastolic (congestive) heart failure: Secondary | ICD-10-CM | POA: Diagnosis not present

## 2023-09-19 DIAGNOSIS — D62 Acute posthemorrhagic anemia: Secondary | ICD-10-CM | POA: Diagnosis not present

## 2023-09-19 DIAGNOSIS — D631 Anemia in chronic kidney disease: Secondary | ICD-10-CM | POA: Diagnosis not present

## 2023-09-19 DIAGNOSIS — K5731 Diverticulosis of large intestine without perforation or abscess with bleeding: Secondary | ICD-10-CM | POA: Diagnosis not present

## 2023-09-24 DIAGNOSIS — K5731 Diverticulosis of large intestine without perforation or abscess with bleeding: Secondary | ICD-10-CM | POA: Diagnosis not present

## 2023-09-24 DIAGNOSIS — D62 Acute posthemorrhagic anemia: Secondary | ICD-10-CM | POA: Diagnosis not present

## 2023-09-24 DIAGNOSIS — D631 Anemia in chronic kidney disease: Secondary | ICD-10-CM | POA: Diagnosis not present

## 2023-09-24 DIAGNOSIS — N1832 Chronic kidney disease, stage 3b: Secondary | ICD-10-CM | POA: Diagnosis not present

## 2023-09-24 DIAGNOSIS — I130 Hypertensive heart and chronic kidney disease with heart failure and stage 1 through stage 4 chronic kidney disease, or unspecified chronic kidney disease: Secondary | ICD-10-CM | POA: Diagnosis not present

## 2023-09-24 DIAGNOSIS — I5032 Chronic diastolic (congestive) heart failure: Secondary | ICD-10-CM | POA: Diagnosis not present

## 2023-10-03 ENCOUNTER — Inpatient Hospital Stay: Payer: Medicare Other

## 2023-10-03 ENCOUNTER — Inpatient Hospital Stay: Payer: Medicare Other | Attending: Oncology

## 2023-10-03 VITALS — BP 152/50 | HR 70 | Temp 98.1°F | Resp 18

## 2023-10-03 DIAGNOSIS — N1832 Chronic kidney disease, stage 3b: Secondary | ICD-10-CM | POA: Insufficient documentation

## 2023-10-03 DIAGNOSIS — D631 Anemia in chronic kidney disease: Secondary | ICD-10-CM | POA: Diagnosis present

## 2023-10-03 LAB — CBC WITH DIFFERENTIAL (CANCER CENTER ONLY)
Abs Immature Granulocytes: 0.04 10*3/uL (ref 0.00–0.07)
Basophils Absolute: 0.1 10*3/uL (ref 0.0–0.1)
Basophils Relative: 1 %
Eosinophils Absolute: 0.1 10*3/uL (ref 0.0–0.5)
Eosinophils Relative: 1 %
HCT: 29.8 % — ABNORMAL LOW (ref 36.0–46.0)
Hemoglobin: 9.7 g/dL — ABNORMAL LOW (ref 12.0–15.0)
Immature Granulocytes: 0 %
Lymphocytes Relative: 18 %
Lymphs Abs: 1.7 10*3/uL (ref 0.7–4.0)
MCH: 31.2 pg (ref 26.0–34.0)
MCHC: 32.6 g/dL (ref 30.0–36.0)
MCV: 95.8 fL (ref 80.0–100.0)
Monocytes Absolute: 0.7 10*3/uL (ref 0.1–1.0)
Monocytes Relative: 7 %
Neutro Abs: 6.5 10*3/uL (ref 1.7–7.7)
Neutrophils Relative %: 73 %
Platelet Count: 253 10*3/uL (ref 150–400)
RBC: 3.11 MIL/uL — ABNORMAL LOW (ref 3.87–5.11)
RDW: 15.4 % (ref 11.5–15.5)
WBC Count: 9.1 10*3/uL (ref 4.0–10.5)
nRBC: 0 % (ref 0.0–0.2)
nRBC: 0 /100{WBCs}

## 2023-10-03 MED ORDER — EPOETIN ALFA-EPBX 20000 UNIT/ML IJ SOLN
20000.0000 [IU] | Freq: Once | INTRAMUSCULAR | Status: AC
  Administered 2023-10-03: 20000 [IU] via SUBCUTANEOUS
  Filled 2023-10-03: qty 1

## 2023-10-03 NOTE — Patient Instructions (Signed)

## 2023-10-28 NOTE — Progress Notes (Signed)
Remote pacemaker transmission.

## 2023-11-04 ENCOUNTER — Inpatient Hospital Stay: Payer: Medicare Other

## 2023-11-04 ENCOUNTER — Inpatient Hospital Stay: Payer: Medicare Other | Attending: Oncology

## 2023-11-04 DIAGNOSIS — N1832 Chronic kidney disease, stage 3b: Secondary | ICD-10-CM | POA: Insufficient documentation

## 2023-11-04 DIAGNOSIS — D631 Anemia in chronic kidney disease: Secondary | ICD-10-CM | POA: Diagnosis present

## 2023-11-04 LAB — CBC WITH DIFFERENTIAL (CANCER CENTER ONLY)
Abs Immature Granulocytes: 0.03 10*3/uL (ref 0.00–0.07)
Basophils Absolute: 0.1 10*3/uL (ref 0.0–0.1)
Basophils Relative: 1 %
Eosinophils Absolute: 0.5 10*3/uL (ref 0.0–0.5)
Eosinophils Relative: 6 %
HCT: 31 % — ABNORMAL LOW (ref 36.0–46.0)
Hemoglobin: 10.1 g/dL — ABNORMAL LOW (ref 12.0–15.0)
Immature Granulocytes: 0 %
Lymphocytes Relative: 13 %
Lymphs Abs: 1.1 10*3/uL (ref 0.7–4.0)
MCH: 29.3 pg (ref 26.0–34.0)
MCHC: 32.6 g/dL (ref 30.0–36.0)
MCV: 89.9 fL (ref 80.0–100.0)
Monocytes Absolute: 0.5 10*3/uL (ref 0.1–1.0)
Monocytes Relative: 6 %
Neutro Abs: 6 10*3/uL (ref 1.7–7.7)
Neutrophils Relative %: 74 %
Platelet Count: 336 10*3/uL (ref 150–400)
RBC: 3.45 MIL/uL — ABNORMAL LOW (ref 3.87–5.11)
RDW: 14.7 % (ref 11.5–15.5)
WBC Count: 8.2 10*3/uL (ref 4.0–10.5)
nRBC: 0 % (ref 0.0–0.2)
nRBC: 0 /100{WBCs}

## 2023-12-01 NOTE — Progress Notes (Unsigned)
 [facility]  [address] [place],  [place]  [address] [phone]  Clinic Day:  12/02/2023  Referring physician: Annamaria Helling, DO  HISTORY OF PRESENT ILLNESS:  The patient is a 89 y.o. female  with anemia secondary to kidney disease.  She was receiving monthly Retacrit injections to get her hemoglobin above 10.  She comes in today to reassess her anemia.  Since her last visit, the patient has been doing well.  She denies having any overt forms of blood loss since her last visit.    It was in November 2024 when she was hospitalized in [place] for severe anemia.  The patient recalls having 1 bloody bowel movement, which led to her seeking attention.  While in the hospital, her hemoglobin was as low as 5.9, for which she received 5 units of packed red blood cells.  PHYSICAL EXAM:  Blood pressure (!) 138/32, pulse 62, temperature 97.8 F (36.6 C), temperature source Oral, resp. rate 14, height 5' (1.524 m), SpO2 97%. Wt Readings from Last 3 Encounters:  09/17/23 102 lb (46.3 kg)  06/18/23 101 lb 12.8 oz (46.2 kg)  06/12/23 102 lb (46.3 kg)   Body mass index is 19.92 kg/m. Performance status (ECOG): 3 - Symptomatic, >50% confined to bed Physical Exam Constitutional:      Appearance: Normal appearance. She is not ill-appearing.     Comments: She looks weaker versus previous visits.  She is in a wheelchair.  HENT:     Mouth/Throat:     Mouth: Mucous membranes are moist.     Pharynx: Oropharynx is clear. No oropharyngeal exudate or posterior oropharyngeal erythema.  Cardiovascular:     Rate and Rhythm: Normal rate and regular rhythm.     Heart sounds: No murmur heard.    No friction rub. No gallop.  Pulmonary:     Effort: Pulmonary effort is normal. No respiratory distress.     Breath sounds: Normal breath sounds. No wheezing, rhonchi or rales.  Abdominal:     General: Bowel sounds are normal. There is no distension.     Palpations:  Abdomen is soft. There is no mass.     Tenderness: There is no abdominal tenderness.  Musculoskeletal:        General: No swelling.     Right lower leg: No edema.     Left lower leg: No edema.  Lymphadenopathy:     Cervical: No cervical adenopathy.     Upper Body:     Right upper body: No supraclavicular or axillary adenopathy.     Left upper body: No supraclavicular or axillary adenopathy.     Lower Body: No right inguinal adenopathy. No left inguinal adenopathy.  Skin:    General: Skin is warm.     Coloration: Skin is not jaundiced.     Findings: No lesion or rash.  Neurological:     General: No focal deficit present.     Mental Status: She is alert and oriented to person, place, and time. Mental status is at baseline.  Psychiatric:        Mood and Affect: Mood normal.        Behavior: Behavior normal.        Thought Content: Thought content normal.   LABS:      Latest Ref Rng & Units 12/02/2023    1:11 PM 11/04/2023    1:12 PM 10/03/2023    1:12 PM  CBC  WBC 4.0 - 10.5 K/uL  7.2  8.2  9.1   Hemoglobin 12.0 - 15.0 g/dL 20.2  54.2  9.7   Hematocrit 36.0 - 46.0 % 35.2  31.0  29.8   Platelets 150 - 400 K/uL 324  336  253       Latest Ref Rng & Units 12/02/2023    1:11 PM 09/02/2023    1:32 PM 08/26/2023    6:40 AM  CMP  Glucose 70 - 99 mg/dL 80  97  88   BUN 8 - 23 mg/dL 35  28  29   Creatinine 0.44 - 1.00 mg/dL 7.06  2.37  6.28   Sodium 135 - 145 mmol/L 141  142  136   Potassium 3.5 - 5.1 mmol/L 4.5  4.4  4.6   Chloride 98 - 111 mmol/L 99  107  105   CO2 22 - 32 mmol/L 31  26  23    Calcium 8.9 - 10.3 mg/dL 31.5  9.5  8.5   Total Protein 6.5 - 8.1 g/dL 8.0  7.0    Total Bilirubin 0.0 - 1.2 mg/dL 0.3  0.3    Alkaline Phos 38 - 126 U/L 94  68    AST 15 - 41 U/L 29  30    ALT 0 - 44 U/L 12  15      Latest Reference Range & Units 12/02/23 13:11 12/02/23 13:12  Iron 28 - 170 ug/dL  68  UIBC ug/dL  176  TIBC 160 - 737 ug/dL  106  Saturation Ratios 10.4 - 31.8 %  18   Ferritin 11 - 307 ng/mL  224  Folate >5.9 ng/mL  >40.0  Vitamin B12 180 - 914 pg/mL 4,818 (H)   (H): Data is abnormally high  ASSESSMENT & PLAN:  Assessment/Plan:  A 89 y.o. female with anemia secondary to chronic renal insufficiency.  There is also a recent history of GI blood from an unknown disclosed location.  When evaluating her labs today, I am pleased that her hemoglobin has improved over these past few months without any particular intervention.  Understandably, the patient was pleased with these results.  For now, her hemoglobin will continue to be followed conservatively.  I will see her back in 4 months for repeat clinical assessment.  The patient understands all the plans discussed today and knows to contact our office before then if she has increased fatigue or any overt forms of blood loss which concern her for progressive anemia.    Kemia Wendel Kirby Funk, MD

## 2023-12-02 ENCOUNTER — Other Ambulatory Visit: Payer: Self-pay | Admitting: Oncology

## 2023-12-02 ENCOUNTER — Telehealth: Payer: Self-pay | Admitting: Oncology

## 2023-12-02 ENCOUNTER — Inpatient Hospital Stay: Payer: Medicare Other | Attending: Oncology | Admitting: Oncology

## 2023-12-02 ENCOUNTER — Inpatient Hospital Stay: Payer: Medicare Other

## 2023-12-02 VITALS — BP 138/32 | HR 62 | Temp 97.8°F | Resp 14 | Ht 60.0 in

## 2023-12-02 DIAGNOSIS — D631 Anemia in chronic kidney disease: Secondary | ICD-10-CM

## 2023-12-02 DIAGNOSIS — N189 Chronic kidney disease, unspecified: Secondary | ICD-10-CM | POA: Diagnosis not present

## 2023-12-02 DIAGNOSIS — N1832 Chronic kidney disease, stage 3b: Secondary | ICD-10-CM | POA: Diagnosis present

## 2023-12-02 LAB — CBC WITH DIFFERENTIAL (CANCER CENTER ONLY)
Abs Immature Granulocytes: 0.06 10*3/uL (ref 0.00–0.07)
Basophils Absolute: 0.1 10*3/uL (ref 0.0–0.1)
Basophils Relative: 1 %
Eosinophils Absolute: 0.6 10*3/uL — ABNORMAL HIGH (ref 0.0–0.5)
Eosinophils Relative: 8 %
HCT: 35.2 % — ABNORMAL LOW (ref 36.0–46.0)
Hemoglobin: 11 g/dL — ABNORMAL LOW (ref 12.0–15.0)
Immature Granulocytes: 1 %
Lymphocytes Relative: 14 %
Lymphs Abs: 1 10*3/uL (ref 0.7–4.0)
MCH: 28.1 pg (ref 26.0–34.0)
MCHC: 31.3 g/dL (ref 30.0–36.0)
MCV: 90 fL (ref 80.0–100.0)
Monocytes Absolute: 0.5 10*3/uL (ref 0.1–1.0)
Monocytes Relative: 6 %
Neutro Abs: 5.1 10*3/uL (ref 1.7–7.7)
Neutrophils Relative %: 70 %
Platelet Count: 324 10*3/uL (ref 150–400)
RBC: 3.91 MIL/uL (ref 3.87–5.11)
RDW: 17.5 % — ABNORMAL HIGH (ref 11.5–15.5)
WBC Count: 7.2 10*3/uL (ref 4.0–10.5)
nRBC: 0 % (ref 0.0–0.2)
nRBC: 0 /100{WBCs}

## 2023-12-02 LAB — FERRITIN: Ferritin: 224 ng/mL (ref 11–307)

## 2023-12-02 LAB — CMP (CANCER CENTER ONLY)
ALT: 12 U/L (ref 0–44)
AST: 29 U/L (ref 15–41)
Albumin: 3.8 g/dL (ref 3.5–5.0)
Alkaline Phosphatase: 94 U/L (ref 38–126)
Anion gap: 11 (ref 5–15)
BUN: 35 mg/dL — ABNORMAL HIGH (ref 8–23)
CO2: 31 mmol/L (ref 22–32)
Calcium: 10.7 mg/dL — ABNORMAL HIGH (ref 8.9–10.3)
Chloride: 99 mmol/L (ref 98–111)
Creatinine: 1.38 mg/dL — ABNORMAL HIGH (ref 0.44–1.00)
GFR, Estimated: 35 mL/min — ABNORMAL LOW (ref >60)
Glucose, Bld: 80 mg/dL (ref 70–99)
Potassium: 4.5 mmol/L (ref 3.5–5.1)
Sodium: 141 mmol/L (ref 135–145)
Total Bilirubin: 0.3 mg/dL (ref 0.0–1.2)
Total Protein: 8 g/dL (ref 6.5–8.1)

## 2023-12-02 LAB — FOLATE: Folate: >40 ng/mL (ref >5.9)

## 2023-12-02 LAB — IRON AND TIBC
Iron: 68 ug/dL (ref 28–170)
Saturation Ratios: 18 % (ref 10.4–31.8)
TIBC: 370 ug/dL (ref 250–450)
UIBC: 302 ug/dL

## 2023-12-02 LAB — VITAMIN B12: Vitamin B-12: 4818 pg/mL — ABNORMAL HIGH (ref 180–914)

## 2023-12-02 NOTE — Telephone Encounter (Signed)
 12/02/23 Spoke with patient and confirmed next appt.

## 2023-12-03 ENCOUNTER — Encounter: Payer: Self-pay | Admitting: Oncology

## 2023-12-18 ENCOUNTER — Ambulatory Visit (INDEPENDENT_AMBULATORY_CARE_PROVIDER_SITE_OTHER): Payer: Medicare Other

## 2023-12-18 DIAGNOSIS — I442 Atrioventricular block, complete: Secondary | ICD-10-CM | POA: Diagnosis not present

## 2023-12-19 LAB — CUP PACEART REMOTE DEVICE CHECK
Battery Remaining Longevity: 67 mo
Battery Remaining Percentage: 54 %
Battery Voltage: 2.99 V
Brady Statistic AP VP Percent: 1 %
Brady Statistic AP VS Percent: 20 %
Brady Statistic AS VP Percent: 1 %
Brady Statistic AS VS Percent: 79 %
Brady Statistic RA Percent Paced: 19 %
Brady Statistic RV Percent Paced: 1 %
Date Time Interrogation Session: 20250319020017
Implantable Lead Connection Status: 753985
Implantable Lead Connection Status: 753985
Implantable Lead Implant Date: 20190620
Implantable Lead Implant Date: 20190620
Implantable Lead Location: 753859
Implantable Lead Location: 753860
Implantable Pulse Generator Implant Date: 20190620
Lead Channel Impedance Value: 350 Ohm
Lead Channel Impedance Value: 450 Ohm
Lead Channel Pacing Threshold Amplitude: 0.75 V
Lead Channel Pacing Threshold Amplitude: 0.75 V
Lead Channel Pacing Threshold Pulse Width: 0.5 ms
Lead Channel Pacing Threshold Pulse Width: 0.5 ms
Lead Channel Sensing Intrinsic Amplitude: 3.9 mV
Lead Channel Sensing Intrinsic Amplitude: 9.7 mV
Lead Channel Setting Pacing Amplitude: 1 V
Lead Channel Setting Pacing Amplitude: 2 V
Lead Channel Setting Pacing Pulse Width: 0.5 ms
Lead Channel Setting Sensing Sensitivity: 2 mV
Pulse Gen Model: 2272
Pulse Gen Serial Number: 9035930

## 2023-12-25 ENCOUNTER — Encounter: Payer: Self-pay | Admitting: Cardiology

## 2023-12-26 ENCOUNTER — Encounter: Payer: Self-pay | Admitting: Oncology

## 2023-12-30 ENCOUNTER — Ambulatory Visit: Payer: Medicare Other | Admitting: Cardiology

## 2024-01-03 ENCOUNTER — Encounter: Payer: Self-pay | Admitting: Cardiology

## 2024-01-03 ENCOUNTER — Ambulatory Visit: Attending: Cardiology | Admitting: Cardiology

## 2024-01-03 VITALS — BP 136/30 | HR 64 | Ht 60.0 in | Wt 100.4 lb

## 2024-01-03 DIAGNOSIS — I071 Rheumatic tricuspid insufficiency: Secondary | ICD-10-CM | POA: Diagnosis present

## 2024-01-03 DIAGNOSIS — Z952 Presence of prosthetic heart valve: Secondary | ICD-10-CM

## 2024-01-03 DIAGNOSIS — I10 Essential (primary) hypertension: Secondary | ICD-10-CM

## 2024-01-03 DIAGNOSIS — I442 Atrioventricular block, complete: Secondary | ICD-10-CM

## 2024-01-03 DIAGNOSIS — N183 Chronic kidney disease, stage 3 unspecified: Secondary | ICD-10-CM

## 2024-01-03 DIAGNOSIS — I5032 Chronic diastolic (congestive) heart failure: Secondary | ICD-10-CM

## 2024-01-03 DIAGNOSIS — Z950 Presence of cardiac pacemaker: Secondary | ICD-10-CM

## 2024-01-03 NOTE — Progress Notes (Signed)
 Cardiology Office Note:    Date:  01/03/2024   ID:  [name], DOB 10-05-1925, MRN [id number]  PCP:  Annamaria Helling, DO   [facility] Providers Cardiologist:  Norman Herrlich, MD Electrophysiologist:  Will Jorja Loa, MD     Referring MD: Annamaria Helling, DO   CC: follow up   History of Present Illness:    Kaitlin Villarreal is a 89 y.o. female with a hx of hypertension, complete heart block s/p Saint Jude dual-chamber PPM on 03/20/2018, chronic diastolic heart failure, ventral septal defect, severe aortic stenosis s/p TAVR 2019, history of TIAs, GERD, CKD stage III, anemia related to CKD, hyperlipidemia.  12/19/2023 device check stable 08/13/2023 echo EF greater than 50%, mild mitral regurgitation, mild aortic regurgitation with bioprosthetic aortic valve functioning normally, severe tricuspid valve regurgitation with RSVP severely elevated at 114 mmHg 11/15/2022 echo EF greater than 70%, mild concentric LVH, RV mildly enlarged, mild mitral valve annular calcification, mild to moderate MR, mild to moderate MS, mild AR. 620 2019 ppm implantation 03/18/2018 TAVR  Most recently evaluated by Dr. Elberta Fortis on 01/18/2023, at this time she was doing well from an EP perspective and she was advised she could follow-up with EP in 12 months.  Remote pacemaker device check on 12/26/2022 had revealed abnormal device interrogation, noise noted on A lead.  Evaluated on 06/18/2023 by general cardiology, she was doing well, weighing herself daily, taking Lasix on occasion for pedal edema.  She was admitted to the hospital on 08/20/2023 following a fall, syncopal event and GI bleed, her aspirin was held for 7 days, she was advised to follow-up with her PCP.  Evaluated 09/17/2023 by myself, she was stable, no chest follow-up in 3 months.  She presents today accompanied by her daughter.  She offers no formal complaints from a cardiac perspective.  She has some cognitive impairments and her daughter  provides much of her history. She denies chest pain, palpitations, dyspnea, pnd, orthopnea, n, v, dizziness, syncope, edema, weight gain, or early satiety.    Past Medical History:  Diagnosis Date   ABLA (acute blood loss anemia) 08/21/2023   Abnormal CT scan, gallbladder 09/08/2020   Acute on chronic diastolic heart failure (HCC) 03/18/2018   Anemia in chronic kidney disease 11/08/2022   Aortic stenosis 10/14/2014   Formatting of this note might be different from the original.  moderate by echo 11/2015   Arthritis    Bilateral renal masses 07/26/2017   Bone spur of toe of left foot    Chronic diastolic (congestive) heart failure (HCC)    Chronic diastolic heart failure (HCC) 03/18/2018   Chronic renal insufficiency 04/10/2021   CKD (chronic kidney disease) stage 3, GFR 30-59 ml/min (HCC) 07/26/2017   Diverticulosis of colon with hemorrhage 08/22/2023   Dizziness 04/10/2021   Elevated brain natriuretic peptide (BNP) level 04/10/2021   Essential hypertension    Foot pain, bilateral 10/18/2021   GERD (gastroesophageal reflux disease)    GI bleed 08/20/2023   Hematochezia 08/21/2023   HLD (hyperlipidemia)    Hyperlipidemia 11/25/2016   Hypertension 04/10/2021   Hypertensive encephalopathy 04/10/2021   Non-pressure chronic ulcer of skin of other sites with unspecified severity (HCC) 09/17/2023   Open wound 04/10/2021   Osteoarthritis 04/10/2021   Pacemaker 09/02/2018   Pancreatic lesion 04/17/2018   Pancreatic mass    a. benign appearing but needs f/u, noted on pre TAVR CTs   Plantar fat pad atrophy of left foot    S/P  TAVR (transcatheter aortic valve replacement) 03/18/2018   23 mm Edwards Sapien 3 transcatheter heart valve placed via percutaneous right transfemoral approach    Severe aortic stenosis    a. 03/2018: s/p TAVR by Excell Seltzer and Dr. Cornelius Moras   Stage 3 chronic kidney disease [facility])    Stage 3 chronic kidney disease due to benign hypertension (HCC) 04/10/2021   TIA (transient  ischemic attack)     a. 1992   VSD (ventricular septal defect)     Past Surgical History:  Procedure Laterality Date   ABDOMINAL HYSTERECTOMY     APPENDECTOMY     HERNIA REPAIR     INTRAOPERATIVE TRANSTHORACIC ECHOCARDIOGRAM N/A 03/18/2018   Procedure: INTRAOPERATIVE TRANSTHORACIC ECHOCARDIOGRAM;  Surgeon: Tonny Bollman, MD;  Location: [facility];  Service: Open Heart Surgery;  Laterality: N/A;   PACEMAKER IMPLANT N/A 03/20/2018   Procedure: PACEMAKER IMPLANT;  Surgeon: Regan Lemming, MD;  Location: [facility] INVASIVE CV LAB;  Service: Cardiovascular;  Laterality: N/A;   RIGHT HEART CATH N/A 03/20/2018   Procedure: RIGHT HEART CATH;  Surgeon: Tonny Bollman, MD;  Location: [facility] INVASIVE CV LAB;  Service: Cardiovascular;  Laterality: N/A;   RIGHT/LEFT HEART CATH AND CORONARY ANGIOGRAPHY N/A 02/06/2018   Procedure: RIGHT/LEFT HEART CATH AND CORONARY ANGIOGRAPHY;  Surgeon: Tonny Bollman, MD;  Location: [facility] INVASIVE CV LAB;  Service: Cardiovascular;  Laterality: N/A;   TONSILLECTOMY     TRANSCATHETER AORTIC VALVE REPLACEMENT, TRANSFEMORAL N/A 03/18/2018   Procedure: TRANSCATHETER AORTIC VALVE REPLACEMENT, TRANSFEMORAL;  Surgeon: Tonny Bollman, MD;  Location: [facility] OR;  Service: Open Heart Surgery;  Laterality: N/A;   WISDOM TOOTH EXTRACTION      Current Medications: Current Meds  Medication Sig   allopurinol (ZYLOPRIM) 100 MG tablet Take 100 mg by mouth daily.   amLODipine (NORVASC) 5 MG tablet Take 5 mg by mouth daily.   aspirin EC 81 MG tablet Take 1 tablet (81 mg total) by mouth daily.   Carboxymethylcellulose Sod PF (THERATEARS PF) 0.25 % SOLN Place 1 drop into both eyes in the morning and at bedtime.   Cyanocobalamin (B-12) 2500 MCG TABS Take 2,500 mcg by mouth daily.    estradiol (ESTRACE) 1 MG tablet Take 0.5 mg by mouth daily.   furosemide (LASIX) 20 MG tablet Take 1 tablet (20 mg total) by mouth every Monday, Wednesday, and Friday.   lisinopril (ZESTRIL) 40 MG tablet Take 40 mg by mouth  daily.   metoprolol tartrate (LOPRESSOR) 50 MG tablet Take 1 tablet by mouth twice daily   Multiple Vitamins-Minerals (MULTI-VITAMIN GUMMIES) CHEW Chew 2 tablets by mouth daily.    Omega-3 Fatty Acids (FISH OIL) 500 MG CAPS Take 1 capsule by mouth daily.   Vitamin D, Ergocalciferol, (DRISDOL) 1.25 MG (50000 UT) CAPS capsule Take 50,000 Units by mouth See admin instructions. Take 50,000 units by mouth 2 times a week- Mondays and Fridays     Allergies:   Clarithromycin, Latex, Levofloxacin, Azithromycin, Diphenhydramine hcl, and Ivp dye [iodinated contrast media]   Social History   Socioeconomic History   Marital status: Married    Spouse name: Not on file   Number of children: Not on file   Years of education: Not on file   Highest education level: Not on file  Occupational History   Not on file  Tobacco Use   Smoking status: Never    Passive exposure: Never   Smokeless tobacco: Never  Vaping Use   Vaping status: Never Used  Substance and Sexual Activity   Alcohol  use: No   Drug use: No   Sexual activity: Not on file  Other Topics Concern   Not on file  Social History Narrative   Not on file   Social Drivers of Health   Financial Resource Strain: Not on file  Food Insecurity: No Food Insecurity (08/21/2023)   Hunger Vital Sign    Worried About Running Out of Food in the Last Year: Never true    Ran Out of Food in the Last Year: Never true  Transportation Needs: No Transportation Needs (08/21/2023)   PRAPARE - Administrator, Civil Service (Medical): No    Lack of Transportation (Non-Medical): No  Physical Activity: Not on file  Stress: Not on file  Social Connections: Not on file     Family History: The patient's family history includes Congenital heart disease in her brother; Heart attack in her brother; Heart disease in her mother; Uterine cancer in her sister.  ROS:   Please see the history of present illness.     All other systems reviewed and are  negative.  EKGs/Labs/Other Studies Reviewed:    The following studies were reviewed today: Cardiac Studies & Procedures   ______________________________________________________________________________________________ CARDIAC CATHETERIZATION  CARDIAC CATHETERIZATION 03/20/2018  Narrative 1.  No significant left to right shunting based on absence of a significant O2 step up from the SVC to the pulmonary artery 2.  Elevated right and left heart intracardiac filling pressures consistent with acute on chronic diastolic heart failure  Plan: Serial echo follow-up of the patient's small perimembranous VSD, IV diuresis and medical therapy for treatment of congestive heart failure, permanent pacemaker placement per Dr. Elberta Fortis to follow   CARDIAC CATHETERIZATION  CARDIAC CATHETERIZATION 02/06/2018  Narrative  Ost 1st Diag lesion is 50% stenosed.  Prox Cx lesion is 40% stenosed.  There is severe aortic valve stenosis.  1.  Calcified coronary arteries without significant stenosis.  Mild proximal left circumflex stenosis, minimal irregularity of the RCA, minimal irregularity of the LAD and moderate stenosis of an ostial diagonal 2.  Severely calcified aortic valve with known severe aortic stenosis by noninvasive assessment 3.  Normal right heart hemodynamics with preserved cardiac output and normal intracardiac filling pressures  Recommendations: Continue multidisciplinary evaluation for TAVR.  Medical therapy for mild nonobstructive CAD.  Findings Coronary Findings Diagnostic  Dominance: Right  Left Main There is mild diffuse disease throughout the vessel. The vessel is moderately calcified. The left main is calcified with no significant stenosis  Left Anterior Descending The vessel exhibits minimal luminal irregularities. The LAD is patent throughout.  The distal vessel has multiple angulated segments but there are no stenoses.  The origin of the first diagonal branch has 50%  stenosis.  First Diagonal Branch Ost 1st Diag lesion is 50% stenosed.  Left Circumflex There is mild diffuse disease throughout the vessel. Prox Cx lesion is 40% stenosed. The lesion is moderately calcified.  First Obtuse Marginal Branch Vessel is moderate in size. There is mild disease in the vessel.  Second Obtuse Marginal Branch There is mild disease in the vessel.  Right Coronary Artery Vessel is moderate in size. There is mild diffuse disease throughout the vessel. The origin of the RCA is calcified around the right cusp.  The vessel has moderate diffuse calcification.  The vessel is widely patent with minor diffuse irregularities noted but no significant stenosis.  Intervention  No interventions have been documented.   STRESS TESTS  ECHOCARDIOGRAM STRESS TEST 01/08/2018  Narrative *[facility]* [address] [place], [place] [address] [phone]  ------------------------------------------------------------------- Stress Echocardiography  Patient:    Karuna, Balducci MR #:       [id number] Study Date: 01/08/2018 Gender:     F Age:        85 Height:     152.4 cm Weight:     54.5 kg BSA:        1.53 m^2 Pt. Status: Room:  ATTENDING    Default, Provider [phone] [facility]     Norman Herrlich, MD REFERRING    Norman Herrlich, MD REFERRING    Noni Saupe PERFORMING   [facility] SONOGRAPHER  Sinda Du, RDCS  cc:  -------------------------------------------------------------------  ------------------------------------------------------------------- Indications:      Aortic stenosis 424.1.  ------------------------------------------------------------------- History:   PMH:   Murmur.  Aortic valve disease.  Risk factors: Hypertension.  ------------------------------------------------------------------- Study Conclusions  - Aortic valve: Valve area (VTI): 0.7 cm^2. Valve area (Vmax): 0.76 cm^2. Valve area (Vmean): 0.76 cm^2. -  Stress: There was a normal resting blood pressure with a hypotensive response to stress. Functional capacity was decreased (greater than 40%). - Stress ECG conclusions: There were no stress arrhythmias or conduction abnormalities. The stress ECG was non-diagnostic.  ------------------------------------------------------------------- Labs, prior tests, procedures, and surgery: ECG.     Abnormal.  ------------------------------------------------------------------- Study data:   Study status:  Routine.  Consent:  The risks, benefits, and alternatives to the procedure were explained to the patient and informed consent was obtained.  Procedure:  Initial setup. The patient was brought to the laboratory. A baseline ECG was recorded. Surface ECG leads and automatic cuff blood pressure measurements were monitored. Treadmill exercise testing was performed using the modified Bruce protocol. The patient exercised for 4.5 min, to a maximal work rate of 3.4 mets. Exercise was terminated due to fatigue. Transthoracic stress echocardiography for assessment of valvular function. Image quality was adequate. Images were captured at baseline and peak exercise.  Study completion:  The patient tolerated the procedure well. There were no complications.          Modified Bruce protocol. Stress echocardiography.  Birthdate:  Patient birthdate: 1925/12/27.  Age: Patient is 89 yr old.  Sex:  Gender: female.    BMI: 23.5 kg/m^2. Blood pressure:     181/72  Patient status:  Outpatient.  Study date:  Study date: 01/08/2018. Study time: 11:35 AM.  -------------------------------------------------------------------  ------------------------------------------------------------------- Aortic valve:  Resting gradient .  Post stress: Peak stress velocity 376 cm/s. mean velocity 297cm/s. Mean gradient 39 mmHG.  Doppler:     VTI ratio of LVOT to aortic valve: 0.22. Valve area (VTI): 0.7 cm^2. Indexed valve area  (VTI): 0.46 cm^2/m^2. Peak velocity ratio of LVOT to aortic valve: 0.24. Valve area (Vmax): 0.76 cm^2. Indexed valve area (Vmax): 0.5 cm^2/m^2. Mean velocity ratio of LVOT to aortic valve: 0.24. Valve area (Vmean): 0.76 cm^2. Indexed valve area (Vmean): 0.5 cm^2/m^2. Resting mean gradient (S): 44 mm Hg. Peak gradient (S): 75 mm Hg.  ------------------------------------------------------------------- Mitral valve:   Doppler:     Peak gradient (D): 9 mm Hg.  ------------------------------------------------------------------- Baseline ECG:  Normal.  Normal sinus rhythm.  Nonspecific ST changes.  ------------------------------------------------------------------- Stress protocol:  +---------------------+---+------------+--------+ !Stage                !HR !BP (mmHg)   !Symptoms! +---------------------+---+------------+--------+ !Baseline             !70 !181/72 (108)!None    ! +---------------------+---+------------+--------+ !Stage 0              !  105!------------!--------! +---------------------+---+------------+--------+ !Stage 1/2            !116!------------!--------! +---------------------+---+------------+--------+ !Immediate post stress!113!------------!--------! +---------------------+---+------------+--------+ !Recovery; 1 min      !88 !------------!--------! +---------------------+---+------------+--------+ !Recovery; 2 min      !82 !------------!--------! +---------------------+---+------------+--------+  ------------------------------------------------------------------- Stress results:   Maximal heart rate during stress was 116 bpm (89% of maximal predicted heart rate). The maximal predicted heart rate was 131 bpm.The target heart rate was achieved. The heart rate response to stress was normal. There was a normal resting blood pressure with a hypotensive response to stress. The rate-pressure product for the peak heart rate and blood pressure was [address] mm Hg/min.  The  patient experienced no chest pain during stress. Functional capacity was decreased (greater than 40%).  ------------------------------------------------------------------- Stress ECG:  There were no stress arrhythmias or conduction abnormalities.  The stress ECG was non-diagnostic.  ------------------------------------------------------------------- Measurements  Left ventricle                            Value          Reference LV ID, ED, PLAX chordal           (L)     33.4  mm       43 - 52 LV ID, ES, PLAX chordal           (L)     17.9  mm       23 - 38 LV fx shortening, PLAX chordal            46    %        >=29 LV PW thickness, ED                       10.2  mm       --------- IVS/LV PW ratio, ED               (H)     1.49           <=1.3 Stroke volume, 2D                         84    ml       --------- Stroke volume/bsa, 2D                     55    ml/m^2   ---------  Ventricular septum                        Value          Reference IVS thickness, ED                         15.2  mm       ---------  LVOT                                      Value          Reference LVOT ID, S                                20    mm       --------- LVOT area  3.14  cm^2     --------- LVOT peak velocity, S                     105   cm/s     --------- LVOT mean velocity, S                     75.8  cm/s     --------- LVOT VTI, S                               26.8  cm       ---------  Aortic valve                              Value          Reference Aortic valve peak velocity, S             434   cm/s     --------- Aortic valve mean velocity, S             312   cm/s     --------- Aortic valve VTI, S                       121   cm       --------- Aortic mean gradient, S                   40    mm Hg    --------- Aortic peak gradient, S                   75    mm Hg    --------- VTI ratio, LVOT/AV                        0.22           --------- Aortic valve  area, VTI                    0.7   cm^2     --------- Aortic valve area/bsa, VTI                0.46  cm^2/m^2 --------- Velocity ratio, peak, LVOT/AV             0.24           --------- Aortic valve area, peak velocity          0.76  cm^2     --------- Aortic valve area/bsa, peak               0.5   cm^2/m^2 --------- velocity Velocity ratio, mean, LVOT/AV             0.24           --------- Aortic valve area, mean velocity          0.76  cm^2     --------- Aortic valve area/bsa, mean               0.5   cm^2/m^2 --------- velocity  Aorta                                     Value  Reference Aortic root ID, ED                        27    mm       ---------  Left atrium                               Value          Reference LA ID, A-P, ES                            36    mm       --------- LA ID/bsa, A-P                    (H)     2.36  cm/m^2   <=2.2  Mitral valve                              Value          Reference Mitral E-wave peak velocity               147   cm/s     --------- Mitral A-wave peak velocity               118   cm/s     --------- Mitral deceleration time                  194   ms       150 - 230 Mitral peak gradient, D                   9     mm Hg    --------- Mitral E/A ratio, peak                    1.2            ---------  Legend: (L)  and  (H)  mark values outside specified reference range.  ------------------------------------------------------------------- Prepared and Electronically Authenticated by  Norman Herrlich, MD 2019-04-10T13:52:39   ECHOCARDIOGRAM  ECHOCARDIOGRAM COMPLETE 08/12/2019  Narrative ECHOCARDIOGRAM REPORT    Patient Name:   PAYSLIE MCCAIG Gandolfi   Date of Exam: 08/12/2019 Medical Rec #:  [id number]     Height:       60.0 in Accession #:    [id number]    Weight:       116.0 lb Date of Birth:  1926-03-24     BSA:          1.48 m Patient Age:    89 years      BP:           130/50 mmHg Patient Gender: F             HR:            73 bpm. Exam Location:  [address]  Procedure: 2D Echo, Cardiac Doppler and Color Doppler  Indications:     Z95.2 Post TAVR evaluation  History:         Patient has prior history of Echocardiogram examinations, most recent 02/04/2019. TAVR. Hypertensive heart disease. 3rd degree AV block. VSD. Chronic kidney disease.  Sonographer:     Cathie Beams RCS Referring Phys:  [id number] Wille Celeste THOMPSON Diagnosing Phys: Kristeen Miss MD  IMPRESSIONS   1. Left ventricular ejection fraction, by visual estimation, is 60 to 65%. The left ventricle has normal function. There is mildly increased left ventricular hypertrophy. 2. There is a small VSD with left to right shunting present. 3. Global right ventricle has normal systolic function.The right ventricular size is normal. No increase in right ventricular wall thickness. 4. Left atrial size was normal. 5. Right atrial size was normal. 6. The mitral valve is normal in structure. Mild mitral valve regurgitation. Mild mitral stenosis. 7. The tricuspid valve is grossly normal. Tricuspid valve regurgitation mild-moderate. 8. Aortic valve regurgitation is not visualized. 9. The pulmonic valve was grossly normal. Pulmonic valve regurgitation is not visualized. 10. Severely elevated pulmonary artery systolic pressure. 11. The atrial septum is grossly normal.  FINDINGS Left Ventricle: Left ventricular ejection fraction, by visual estimation, is 60 to 65%. The left ventricle has normal function. There is mildly increased left ventricular hypertrophy. There is a small VSD with left to right shunting present.  Right Ventricle: The right ventricular size is normal. No increase in right ventricular wall thickness. Global RV systolic function is has normal systolic function. The tricuspid regurgitant velocity is 4.69 m/s, and with an assumed right atrial pressure of 10 mmHg, the estimated right ventricular systolic pressure is severely elevated at 98.0  mmHg.  Left Atrium: Left atrial size was normal in size.  Right Atrium: Right atrial size was normal in size  Pericardium: There is no evidence of pericardial effusion.  Mitral Valve: The mitral valve is normal in structure. Mild mitral valve stenosis by observation. MV peak gradient, 14.0 mmHg. Mild mitral valve regurgitation.  Tricuspid Valve: The tricuspid valve is grossly normal. Tricuspid valve regurgitation mild-moderate.  Aortic Valve: The aortic valve has been repaired/replaced. Aortic valve regurgitation is not visualized. Aortic valve mean gradient measures 6.5 mmHg. Aortic valve peak gradient measures 13.4 mmHg. Aortic valve area, by VTI measures 2.36 cm. Homograft aortic valve valve is present in the aortic position.  Pulmonic Valve: The pulmonic valve was grossly normal. Pulmonic valve regurgitation is not visualized.  Aorta: The aortic root and ascending aorta are structurally normal, with no evidence of dilitation.  IAS/Shunts: The atrial septum is grossly normal.    LEFT VENTRICLE PLAX 2D LVIDd:         3.50 cm  Diastology LVIDs:         1.90 cm  LV e' lateral:   8.49 cm/s LV PW:         1.20 cm  LV E/e' lateral: 18.7 LV IVS:        1.30 cm  LV e' medial:    5.87 cm/s LVOT diam:     2.00 cm  LV E/e' medial:  27.1 LV SV:         40 ml LV SV Index:   26.48 LVOT Area:     3.14 cm   RIGHT VENTRICLE RV Basal diam:  2.20 cm RV S prime:     12.40 cm/s TAPSE (M-mode): 1.9 cm RVSP:           91.0 mmHg  LEFT ATRIUM             Index       RIGHT ATRIUM           Index LA diam:        3.30 cm 2.23 cm/m  RA Pressure: 3.00 mmHg LA Vol (A2C):   50.4 ml 34.03 ml/m RA Area:     11.60  cm LA Vol (A4C):   28.9 ml 19.52 ml/m RA Volume:   23.80 ml  16.07 ml/m LA Biplane Vol: 38.7 ml 26.13 ml/m AORTIC VALVE AV Area (Vmax):    1.92 cm AV Area (Vmean):   1.97 cm AV Area (VTI):     2.36 cm AV Vmax:           183.00 cm/s AV Vmean:          119.500 cm/s AV VTI:             0.466 m AV Peak Grad:      13.4 mmHg AV Mean Grad:      6.5 mmHg LVOT Vmax:         112.00 cm/s LVOT Vmean:        74.900 cm/s LVOT VTI:          0.350 m LVOT/AV VTI ratio: 0.75  MITRAL VALVE                         TRICUSPID VALVE MV Area (PHT): 2.87 cm              TR Peak grad:   88.0 mmHg MV Peak grad:  14.0 mmHg             TR Vmax:        469.00 cm/s MV Mean grad:  6.0 mmHg              Estimated RAP:  3.00 mmHg MV Vmax:       1.87 m/s              RVSP:           91.0 mmHg MV Vmean:      115.0 cm/s MV VTI:        0.54 m                SHUNTS MV PHT:        76.56 msec            Systemic VTI:  0.35 m MV Decel Time: 264 msec              Systemic Diam: 2.00 cm MV E velocity: 159.00 cm/s 103 cm/s MV A velocity: 142.00 cm/s 70.3 cm/s MV E/A ratio:  1.12        1.5   Kristeen Miss MD Electronically signed by Kristeen Miss MD Signature Date/Time: 08/12/2019/5:28:06 PM    Final (Updated)   TEE  ECHO TEE 03/18/2018  Narrative *[place]* *[facility]* [address] [place], [place] [address] [phone]  ------------------------------------------------------------------- Transesophageal Echocardiography  Patient:    Solene, Hereford MR #:       [id number] Study Date: 03/18/2018 Gender:     F Age:        69 Height:     152.4 cm Weight:     54.4 kg BSA:        1.53 m^2 Pt. Status: Room:       2H24C  ADMITTING    Tressie Stalker, M.D. ATTENDING    Tonny Bollman, MD ORDERING     Tonny Bollman, MD REFERRING    Tonny Bollman, MD PERFORMING   Tobias Alexander, M.D. SONOGRAPHER  Sheralyn Boatman  cc:  ------------------------------------------------------------------- LV EF: 65% -   70%  ------------------------------------------------------------------- Indications:      Aortic stenosis 424.1.  ------------------------------------------------------------------- History:   Risk factors:  Hypertension.  Dyslipidemia.  -------------------------------------------------------------------  Study Conclusions  - Left ventricle: Wall thickness was increased in a pattern of moderate LVH. Systolic function was vigorous. The estimated ejection fraction was in the range of 65% to 70%. Wall motion was normal; there were no regional wall motion abnormalities. - Aortic valve: There was severe regurgitation. Valve area (VTI): 0.77 cm^2. Valve area (Vmax): 0.8 cm^2. Valve area (Vmean): 2.37 cm^2. - Mitral valve: There was mild regurgitation. - Left atrium: No evidence of thrombus in the atrial cavity or appendage. No evidence of thrombus in the atrial cavity or appendage. - Right atrium: No evidence of thrombus in the atrial cavity or appendage.  Impressions:  - This was a periprocedural echocardiogram during a TAVR procedure. TTE was switched to TEE for concern of a VSD and a follow up TEE was done 5 hours later.  Native aortic valve was severely thickened and calcified with severely restricted leaflet openings. Peak/mean transaortic gradients were 68/41 mmHg consistent with severe aortic stenosis. A 23 mm Edwards-SAPIEN 3 valve was successfully deployed in the aortic position. Post-deployment gradients were 12/4 mmHg. No paravalvular leak. A small perimembranous VSD was noted with small left to right shunting. A TEE probe was inserted and confirmed the diagnosis. There is trivial pericardial effusion. Mild mitral and tricuspid regurgitation.  5 hours later a limited TTE was performed. It showed unchanged small VSD, stable aortic valve with normal unchanged gradients. No paravalvular leak. Trivial pericardial effusion.  ------------------------------------------------------------------- Study data:   Study status:  Routine.  Consent:  The risks, benefits, and alternatives to the procedure were explained to the patient and informed consent was obtained.  Procedure:  Study started as a  limited transthoracic, then TEE probe was inserted to evaluate for VSD. The patient reported no pain pre or post test. Initial setup. The patient was brought to the laboratory. Surface ECG leads were monitored. Sedation. Conscious sedation was administered by anesthesiology staff. Transesophageal echocardiography. Topical anesthesia was obtained using viscous lidocaine. An adult multiplane transesophageal probe was inserted by the anesthesiologistwithout difficulty. Image quality was adequate.  Study completion:  The patient tolerated the procedure well. There were no complications.          Diagnostic transesophageal echocardiography.  2D and color Doppler. Birthdate:  Patient birthdate: 1926/02/14.  Age:  Patient is 89 yr old.  Sex:  Gender: female.    BMI: 23.4 kg/m^2.  Blood pressure: 204/61  Patient status:  Inpatient.  Study date:  Study date: 03/18/2018. Study time: 07:34 AM.  Location:  Operating room.  -------------------------------------------------------------------  ------------------------------------------------------------------- Left ventricle:   Wall thickness was increased in a pattern of moderate LVH.   Systolic function was vigorous. The estimated ejection fraction was in the range of 65% to 70%. Wall motion was normal; there were no regional wall motion abnormalities.  ------------------------------------------------------------------- Aortic valve:   Trileaflet; severely thickened, severely calcified leaflets. Cusp separation was normal.  Doppler:  There was severe regurgitation.    VTI ratio of LVOT to aortic valve: 1.2. Valve area (VTI): 0.77 cm^2. Indexed valve area (VTI): 0.5 cm^2/m^2. Peak velocity ratio of LVOT to aortic valve: 0.92. Valve area (Vmax): 0.8 cm^2. Indexed valve area (Vmax): 0.52 cm^2/m^2. Mean velocity ratio of LVOT to aortic valve: 0.93. Valve area (Vmean): 2.37 cm^2. Indexed valve area (Vmean): 1.55 cm^2/m^2.    Mean gradient (S): 3 mm  Hg. Peak gradient (S): 6 mm Hg.  ------------------------------------------------------------------- Aorta:  There was no atheroma. There was no evidence for dissection. Aortic root: The aortic root was not dilated.  Ascending aorta: The ascending aorta was normal in size. Aortic arch: The aortic arch was normal in size. Descending aorta: The descending aorta was normal in size.  ------------------------------------------------------------------- Mitral valve:   Mildly thickened leaflets . Leaflet separation was normal.  Doppler:  There was mild regurgitation.  ------------------------------------------------------------------- Left atrium:  The atrium was normal in size.  No evidence of thrombus in the atrial cavity or appendage.  No evidence of thrombus in the atrial cavity or appendage. The appendage was morphologically a left appendage, multilobulated, and of normal size. Emptying velocity was normal.  ------------------------------------------------------------------- Right ventricle:  The cavity size was normal. Wall thickness was normal. Systolic function was normal.  ------------------------------------------------------------------- Pulmonic valve:    Structurally normal valve.  ------------------------------------------------------------------- Tricuspid valve:   Structurally normal valve.   Leaflet separation was normal.  Doppler:  There was mild regurgitation.  ------------------------------------------------------------------- Pulmonary artery:   The main pulmonary artery was normal-sized.  ------------------------------------------------------------------- Right atrium:  The atrium was normal in size.  No evidence of thrombus in the atrial cavity or appendage. The appendage was morphologically a right appendage.  ------------------------------------------------------------------- Pericardium:  There was no pericardial  effusion.  ------------------------------------------------------------------- Measurements  Left ventricle                            Value          Reference LV ID, ED, PLAX chordal           (L)     28.7  mm       43 - 52 LV ID, ES, PLAX chordal           (L)     19.9  mm       23 - 38 LV fx shortening, PLAX chordal            31    %        >=29 LV PW thickness, ED                       14.3  mm       --------- IVS/LV PW ratio, ED                       0.93           <=1.3 Stroke volume, 2D                         76    ml       --------- Stroke volume/bsa, 2D                     50    ml/m^2   --------- LV end-diastolic volume, 1-p A4C          44    ml       --------- LV ejection fraction, 1-p A4C             66    %        --------- LV end-diastolic volume/bsa, 1-p          29    ml/m^2   --------- A4C LV end-diastolic volume, 2-p              48    ml       --------- LV end-systolic volume, 2-p  16    ml       --------- LV ejection fraction, 2-p                 68    %        --------- Stroke volume, 2-p                        32    ml       --------- LV end-diastolic volume/bsa, 2-p          31    ml/m^2   --------- LV end-systolic volume/bsa, 2-p           10    ml/m^2   --------- Stroke volume/bsa, 2-p                    21.1  ml/m^2   ---------  Ventricular septum                        Value          Reference IVS thickness, ED                         13.3  mm       ---------  LVOT                                      Value          Reference LVOT ID, S                                18    mm       --------- LVOT area                                 2.54  cm^2     --------- LVOT peak velocity, S                     109   cm/s     --------- LVOT mean velocity, S                     77.3  cm/s     --------- LVOT VTI, S                               30.1  cm       ---------  Aortic valve                              Value          Reference Aortic valve peak  velocity, S             118   cm/s     --------- Aortic valve mean velocity, S             82.7  cm/s     --------- Aortic valve VTI, S                       25    cm       ---------  Aortic mean gradient, S                   3     mm Hg    --------- Aortic peak gradient, S                   6     mm Hg    --------- VTI ratio, LVOT/AV                        1.2            --------- Aortic valve area, VTI                    0.77  cm^2     --------- Aortic valve area/bsa, VTI                0.5   cm^2/m^2 --------- Velocity ratio, peak, LVOT/AV             0.92           --------- Aortic valve area, peak velocity          0.8   cm^2     --------- Aortic valve area/bsa, peak               0.52  cm^2/m^2 --------- velocity Velocity ratio, mean, LVOT/AV             0.93           --------- Aortic valve area, mean velocity          2.37  cm^2     --------- Aortic valve area/bsa, mean               1.55  cm^2/m^2 --------- velocity  Aorta                                     Value          Reference Aortic root ID, ED                        27    mm       --------- Ascending aorta ID, A-P, S                29    mm       ---------  Left atrium                               Value          Reference LA ID, A-P, ES                            24    mm       --------- LA ID/bsa, A-P                            1.57  cm/m^2   <=2.2  Legend: (L)  and  (H)  mark values outside specified reference range.  ------------------------------------------------------------------- Prepared and Electronically Authenticated by  Tobias Alexander, M.D. 2019-06-18T16:24:45    CT SCANS  CT CORONARY MORPH W/CTA COR W/SCORE 02/20/2018  Addendum 02/20/2018  6:32 PM ADDENDUM REPORT: 02/20/2018 18:29  CLINICAL DATA:  89 year old female with severe aortic stenosis being evaluated for a TAVR procedure.  EXAM: Cardiac TAVR CT  TECHNIQUE: The patient was scanned on a [employer]. A 120  kV retrospective scan was triggered in the descending thoracic aorta at 111 HU's. Gantry rotation speed was 250 msecs and collimation was .6 mm. No beta blockade or nitro were given. The 3D data set was reconstructed in 5% intervals of the R-R cycle. Systolic and diastolic phases were analyzed on a dedicated work station using MPR, MIP and VRT modes. The patient received 80 cc of contrast.  FINDINGS: Aortic Valve: Trileaflet, severely thickened, moderately calcified aortic valve with mild calcifications extending asymmetrically into the LVOT under the right and left coronary cusps.  Aorta: Normal size with moderate diffuse calcifications and atherosclerosis, no dissection.  Sinotubular Junction: 24 x 24 mm  Ascending Thoracic Aorta: 29 x 29 mm  Aortic Arch: 23 x 23 mm  Descending Thoracic Aorta: 19 x 19 mm  Sinus of Valsalva Measurements:  Non-coronary: 31 mm  Right -coronary: 30 mm  Left -coronary: 29 mm  Sinus of Valsalva Height:  Right -coronary: 22 mm  Left -coronary: 21 mm  Coronary Artery Height above Annulus:  Left Main: 17 mm  Right Coronary: 18 mm  Virtual Basal Annulus Measurements:  Maximum/Minimum Diameter: 22.7 x 18.5 mm  Mean Diameter: 20.4 mm  Perimeter: 65.1 mm  Area: 326 mm2  Optimum Fluoroscopic Angle for Delivery: LAO 24 CAU 20  IMPRESSION: 1. Trileaflet, severely thickened, moderately calcified aortic valve with mild calcifications extending asymmetrically into the LVOT under the right and left coronary cusps. Annular measurements suitable for delivery of a 20 mm Edwards-SAPIEN 3 valve or a 26 mm Medtronic Evolut R valve.  2. Sufficient coronary to annulus distance.  3. Optimum Fluoroscopic Angle for Delivery: LAO 24 CAU 20  4. No thrombus in the left atrial appendage.   Electronically Signed By: Tobias Alexander On: 02/20/2018 18:29  Narrative EXAM: OVER-READ INTERPRETATION  CT CHEST  The following report is an  over-read performed by radiologist Dr. Trudie Reed of [facility] on 02/20/2018. This over-read does not include interpretation of cardiac or coronary anatomy or pathology. The coronary calcium score/coronary CTA interpretation by the cardiologist is attached.  COMPARISON:  None.  FINDINGS: Extracardiac findings will be described under separate dictation for contemporaneously obtained CTA chest, abdomen and pelvis.  IMPRESSION: Please see separate dictation for contemporaneously obtained CTA chest, abdomen and pelvis dated 02/20/2018 for full description of relevant extracardiac findings.  Electronically Signed: By: Trudie Reed M.D. On: 02/20/2018 15:14     ______________________________________________________________________________________________      EKG:  EKG is not ordered today.   Recent Labs: 12/02/2023: ALT 12; BUN 35; Creatinine 1.38; Hemoglobin 11.0; Platelet Count 324; Potassium 4.5; Sodium 141  Recent Lipid Panel    Component Value Date/Time   CHOL 217 (H) 04/10/2021 1357   TRIG 180 (H) 04/10/2021 1357   HDL 78 04/10/2021 1357   CHOLHDL 2.8 04/10/2021 1357   LDLCALC 108 (H) 04/10/2021 1357     Risk Assessment/Calculations:                Physical Exam:    VS:  BP (!) 136/30   Pulse 64   Ht 5' (1.524 m)   Wt 100 lb 6.4 oz (45.5 kg)   SpO2 94%   BMI 19.61 kg/m     Wt Readings from Last 3 Encounters:  01/03/24 100 lb 6.4  oz (45.5 kg)  09/17/23 102 lb (46.3 kg)  06/18/23 101 lb 12.8 oz (46.2 kg)     GEN: Appears younger than stated age, thin but well nourished, well developed in no acute distress HEENT: Normal NECK: No JVD; No carotid bruits LYMPHATICS: No lymphadenopathy CARDIAC: RRR, 3/6 systolic murmur, rubs, gallops RESPIRATORY:  Clear to auscultation without rales, wheezing or rhonchi  ABDOMEN: Soft, non-tender, non-distended MUSCULOSKELETAL: Trace pedal edema left greater than right; No deformity  SKIN: Warm and  dry NEUROLOGIC:  Alert and oriented x 3 PSYCHIATRIC:  Normal affect   ASSESSMENT:    1. Chronic heart failure with preserved ejection fraction (HCC)   2. Complete AV block (HCC)   3. Presence of cardiac pacemaker   4. S/P TAVR (transcatheter aortic valve replacement)   5. Essential hypertension   6. Stage 3 chronic kidney disease, unspecified whether stage 3a or 3b CKD (HCC)   7. Severe tricuspid regurgitation      PLAN:    In order of problems listed above:  Chronic diastolic heart failure -most recent echo 08/2023 revealed revealed an EF greater than 50%, NYHA class I, euvolemic.  Continue metoprolol 50 mg daily, continue lisinopril 40 mg daily.  Continue to weigh daily, continue Lasix low-dose Monday Wednesday Friday.  Most recent creatinine is stable at 1.64, potassium 4.4.  Complete AV block/s/p pacemaker - followed by EP, follow-up is overdue with them and we will schedule an appointment today 4.  S/P TAVR - 3/6 murmur noted, most recent echo revealed mild AR.  Severe tricuspid regurgitation-she is asymptomatic, we did discuss repeating an echocardiogram for surveillance however her daughter politely declines, they would not be interested in any surgical intervention.  HTN -blood pressure is well-controlled at 136/30, continue Norvasc 5 mg daily, continue lisinopril 40 mg daily, continue metoprolol 50 mg twice daily.  Stage III CKD-careful titration of antihypertensive agents and diuretics.   Disposition-follow-up in 3 months with Dr. Dulce Sellar.       Medication Adjustments/Labs and Tests Ordered: Current medicines are reviewed at length with the patient today.  Concerns regarding medicines are outlined above.  No orders of the defined types were placed in this encounter.  No orders of the defined types were placed in this encounter.   Patient Instructions  Medication Instructions:  Your physician recommends that you continue on your current medications as directed.  Please refer to the Current Medication list given to you today.  *If you need a refill on your cardiac medications before your next appointment, please call your pharmacy*  Lab Work: NONE If you have labs (blood work) drawn today and your tests are completely normal, you will receive your results only by: MyChart Message (if you have MyChart) OR A paper copy in the mail If you have any lab test that is abnormal or we need to change your treatment, we will call you to review the results.  Testing/Procedures: NONE  Follow-Up: At [facility], you and your health needs are our priority.  As part of our continuing mission to provide you with exceptional heart care, our providers are all part of one team.  This team includes your primary Cardiologist (physician) and Advanced Practice Providers or APPs (Physician Assistants and Nurse Practitioners) who all work together to provide you with the care you need, when you need it.  Your next appointment:   3 month(s)  Provider:   Norman Herrlich, MD    We recommend signing up for the patient portal called "  MyChart".  Sign up information is provided on this After Visit Summary.  MyChart is used to connect with patients for Virtual Visits (Telemedicine).  Patients are able to view lab/test results, encounter notes, upcoming appointments, etc.  Non-urgent messages can be sent to your provider as well.   To learn more about what you can do with MyChart, go to [email or web address].   Other Instructions          Signed, Flossie Dibble, NP  01/03/2024 3:33 PM    [facility]

## 2024-01-03 NOTE — Patient Instructions (Signed)
 Medication Instructions:  Your physician recommends that you continue on your current medications as directed. Please refer to the Current Medication list given to you today.  *If you need a refill on your cardiac medications before your next appointment, please call your pharmacy*  Lab Work: NONE If you have labs (blood work) drawn today and your tests are completely normal, you will receive your results only by: MyChart Message (if you have MyChart) OR A paper copy in the mail If you have any lab test that is abnormal or we need to change your treatment, we will call you to review the results.  Testing/Procedures: NONE  Follow-Up: At [facility], you and your health needs are our priority.  As part of our continuing mission to provide you with exceptional heart care, our providers are all part of one team.  This team includes your primary Cardiologist (physician) and Advanced Practice Providers or APPs (Physician Assistants and Nurse Practitioners) who all work together to provide you with the care you need, when you need it.  Your next appointment:   3 month(s)  Provider:   Norman Herrlich, MD    We recommend signing up for the patient portal called "MyChart".  Sign up information is provided on this After Visit Summary.  MyChart is used to connect with patients for Virtual Visits (Telemedicine).  Patients are able to view lab/test results, encounter notes, upcoming appointments, etc.  Non-urgent messages can be sent to your provider as well.   To learn more about what you can do with MyChart, go to [email or web address].   Other Instructions

## 2024-01-06 ENCOUNTER — Other Ambulatory Visit

## 2024-01-06 ENCOUNTER — Inpatient Hospital Stay: Attending: Oncology

## 2024-01-06 ENCOUNTER — Inpatient Hospital Stay

## 2024-01-06 ENCOUNTER — Ambulatory Visit

## 2024-01-06 DIAGNOSIS — N1832 Chronic kidney disease, stage 3b: Secondary | ICD-10-CM | POA: Diagnosis present

## 2024-01-06 DIAGNOSIS — D631 Anemia in chronic kidney disease: Secondary | ICD-10-CM | POA: Insufficient documentation

## 2024-01-06 LAB — CBC WITH DIFFERENTIAL (CANCER CENTER ONLY)
Abs Immature Granulocytes: 0.04 10*3/uL (ref 0.00–0.07)
Basophils Absolute: 0 10*3/uL (ref 0.0–0.1)
Basophils Relative: 1 %
Eosinophils Absolute: 0.1 10*3/uL (ref 0.0–0.5)
Eosinophils Relative: 1 %
HCT: 33.2 % — ABNORMAL LOW (ref 36.0–46.0)
Hemoglobin: 10.7 g/dL — ABNORMAL LOW (ref 12.0–15.0)
Immature Granulocytes: 1 %
Lymphocytes Relative: 21 %
Lymphs Abs: 1.5 10*3/uL (ref 0.7–4.0)
MCH: 29.5 pg (ref 26.0–34.0)
MCHC: 32.2 g/dL (ref 30.0–36.0)
MCV: 91.5 fL (ref 80.0–100.0)
Monocytes Absolute: 0.5 10*3/uL (ref 0.1–1.0)
Monocytes Relative: 7 %
Neutro Abs: 5.1 10*3/uL (ref 1.7–7.7)
Neutrophils Relative %: 69 %
Platelet Count: 212 10*3/uL (ref 150–400)
RBC: 3.63 MIL/uL — ABNORMAL LOW (ref 3.87–5.11)
RDW: 19.2 % — ABNORMAL HIGH (ref 11.5–15.5)
WBC Count: 7.3 10*3/uL (ref 4.0–10.5)
nRBC: 0 % (ref 0.0–0.2)
nRBC: 0 /100{WBCs}

## 2024-01-06 NOTE — Progress Notes (Signed)
 Due to Hgb being 10.7 today, pt and her daughter declined aranesp injection today.  Dr Melvyn Neth aware and states that is ok.

## 2024-01-31 NOTE — Progress Notes (Signed)
 Remote pacemaker transmission.

## 2024-01-31 NOTE — Addendum Note (Signed)
 Addended by: Lott Rouleau A on: 01/31/2024 11:54 AM   Modules accepted: Orders

## 2024-02-03 ENCOUNTER — Inpatient Hospital Stay

## 2024-02-03 ENCOUNTER — Inpatient Hospital Stay: Attending: Oncology

## 2024-02-03 DIAGNOSIS — N1832 Chronic kidney disease, stage 3b: Secondary | ICD-10-CM | POA: Insufficient documentation

## 2024-02-03 DIAGNOSIS — D631 Anemia in chronic kidney disease: Secondary | ICD-10-CM | POA: Insufficient documentation

## 2024-02-03 DIAGNOSIS — N189 Chronic kidney disease, unspecified: Secondary | ICD-10-CM

## 2024-02-03 LAB — CBC WITH DIFFERENTIAL (CANCER CENTER ONLY)
Abs Immature Granulocytes: 0.07 10*3/uL (ref 0.00–0.07)
Basophils Absolute: 0.1 10*3/uL (ref 0.0–0.1)
Basophils Relative: 1 %
Eosinophils Absolute: 0.2 10*3/uL (ref 0.0–0.5)
Eosinophils Relative: 3 %
HCT: 32.6 % — ABNORMAL LOW (ref 36.0–46.0)
Hemoglobin: 10.1 g/dL — ABNORMAL LOW (ref 12.0–15.0)
Immature Granulocytes: 1 %
Lymphocytes Relative: 21 %
Lymphs Abs: 1.4 10*3/uL (ref 0.7–4.0)
MCH: 30 pg (ref 26.0–34.0)
MCHC: 31 g/dL (ref 30.0–36.0)
MCV: 96.7 fL (ref 80.0–100.0)
Monocytes Absolute: 0.3 10*3/uL (ref 0.1–1.0)
Monocytes Relative: 5 %
Neutro Abs: 4.6 10*3/uL (ref 1.7–7.7)
Neutrophils Relative %: 69 %
Platelet Count: 218 10*3/uL (ref 150–400)
RBC: 3.37 MIL/uL — ABNORMAL LOW (ref 3.87–5.11)
RDW: 17.7 % — ABNORMAL HIGH (ref 11.5–15.5)
Smear Review: NORMAL
WBC Count: 6.7 10*3/uL (ref 4.0–10.5)
nRBC: 0 % (ref 0.0–0.2)
nRBC: 0 /100{WBCs}

## 2024-02-03 NOTE — Progress Notes (Signed)
 Pt and daughter declined epoetin  shot today since Hgb is 10.1

## 2024-02-20 ENCOUNTER — Telehealth: Payer: Self-pay

## 2024-02-20 ENCOUNTER — Telehealth: Payer: Self-pay | Admitting: Pharmacist

## 2024-02-20 NOTE — Telephone Encounter (Signed)
 Completed patient assistance application for Farxiga through [employer] and email completed application to Tiffany to have patient and provider review and sign at upcoming appointment.

## 2024-02-20 NOTE — Progress Notes (Signed)
   02/20/2024  Patient ID: Kaitlin Villarreal, female   DOB: 25-Jan-1926, 89 y.o.   MRN: [id number]  Telephonic engagement today with [name] daughter, [name]. Informed Ms. [name] of patient assistance program for [place]. Ms. [name] is interested in applying. Will coordinate with [facility]ocate for application and enrollment. Ms. [name] will stop by office tomorrow to sign application and pick up samples.   Calvert Caul, PharmD Clinical Pharmacist [place] Direct Dial: [phone]

## 2024-02-25 NOTE — Progress Notes (Signed)
 Pharmacy Medication Assistance Program Note    02/25/2024  Patient ID: Kaitlin Villarreal, female   DOB: 08/20/1926, 89 y.o.   MRN: [id number]     02/20/2024  Outreach Medication One  Manufacturer Medication One [employer]  [employer] [facility] Farxiga  Dose of Farxiga 10mg   Type of Assistance Manufacturer Assistance  Date Application Sent to Patient 02/20/2024  Application Items Requested Application;Proof of Income  Date Application Sent to Prescriber 02/20/2024  Name of Prescriber Yevette Hem  Date Application Received From Patient 02/25/2024  Application Items Received From Patient Application  Date Application Received From Provider 02/25/2024  Date Application Submitted to Manufacturer 02/25/2024  Method Application Sent to Manufacturer Fax  Patient Assistance Determination Approved  Approval Start Date 02/25/2024  Approval End Date 09/30/2024  Patient Notification Method Telephone Call     Signature

## 2024-02-25 NOTE — Telephone Encounter (Signed)
 PAP: Application for Marcelline Deist has been submitted to [employer] ([employer]), via fax

## 2024-02-25 NOTE — Telephone Encounter (Signed)
 PAP: Patient assistance application for Farxiga has been approved by PAP Companies: [employer] from 02/25/2024 to 09/30/2024. Medication should be delivered to PAP Delivery: Home. For further shipping updates, please contact [employer] ([employer]) at [phone]. Patient ID is: [id number]

## 2024-03-09 ENCOUNTER — Inpatient Hospital Stay: Attending: Oncology

## 2024-03-09 ENCOUNTER — Inpatient Hospital Stay

## 2024-03-09 DIAGNOSIS — D631 Anemia in chronic kidney disease: Secondary | ICD-10-CM

## 2024-03-09 DIAGNOSIS — N1832 Chronic kidney disease, stage 3b: Secondary | ICD-10-CM | POA: Insufficient documentation

## 2024-03-09 LAB — CBC WITH DIFFERENTIAL (CANCER CENTER ONLY)
Abs Immature Granulocytes: 0.03 10*3/uL (ref 0.00–0.07)
Basophils Absolute: 0 10*3/uL (ref 0.0–0.1)
Basophils Relative: 0 %
Eosinophils Absolute: 0.1 10*3/uL (ref 0.0–0.5)
Eosinophils Relative: 1 %
HCT: 34.9 % — ABNORMAL LOW (ref 36.0–46.0)
Hemoglobin: 11.2 g/dL — ABNORMAL LOW (ref 12.0–15.0)
Immature Granulocytes: 0 %
Lymphocytes Relative: 22 %
Lymphs Abs: 1.5 10*3/uL (ref 0.7–4.0)
MCH: 30.9 pg (ref 26.0–34.0)
MCHC: 32.1 g/dL (ref 30.0–36.0)
MCV: 96.4 fL (ref 80.0–100.0)
Monocytes Absolute: 0.5 10*3/uL (ref 0.1–1.0)
Monocytes Relative: 8 %
Neutro Abs: 4.8 10*3/uL (ref 1.7–7.7)
Neutrophils Relative %: 69 %
Platelet Count: 204 10*3/uL (ref 150–400)
RBC: 3.62 MIL/uL — ABNORMAL LOW (ref 3.87–5.11)
RDW: 15.2 % (ref 11.5–15.5)
WBC Count: 6.9 10*3/uL (ref 4.0–10.5)
nRBC: 0 % (ref 0.0–0.2)

## 2024-03-09 NOTE — Progress Notes (Signed)
 Epoetin held for HGB 11.3.

## 2024-03-18 ENCOUNTER — Ambulatory Visit (INDEPENDENT_AMBULATORY_CARE_PROVIDER_SITE_OTHER): Payer: Medicare Other

## 2024-03-18 DIAGNOSIS — I442 Atrioventricular block, complete: Secondary | ICD-10-CM

## 2024-03-19 ENCOUNTER — Ambulatory Visit: Payer: Self-pay | Admitting: Cardiology

## 2024-03-19 LAB — CUP PACEART REMOTE DEVICE CHECK
Battery Remaining Longevity: 64 mo
Battery Remaining Percentage: 52 %
Battery Voltage: 2.99 V
Brady Statistic AP VP Percent: 1 %
Brady Statistic AP VS Percent: 20 %
Brady Statistic AS VP Percent: 1 %
Brady Statistic AS VS Percent: 79 %
Brady Statistic RA Percent Paced: 20 %
Brady Statistic RV Percent Paced: 1 %
Date Time Interrogation Session: 20250618030740
Implantable Lead Connection Status: 753985
Implantable Lead Connection Status: 753985
Implantable Lead Implant Date: 20190620
Implantable Lead Implant Date: 20190620
Implantable Lead Location: 753859
Implantable Lead Location: 753860
Implantable Pulse Generator Implant Date: 20190620
Lead Channel Impedance Value: 350 Ohm
Lead Channel Impedance Value: 450 Ohm
Lead Channel Pacing Threshold Amplitude: 0.75 V
Lead Channel Pacing Threshold Amplitude: 0.75 V
Lead Channel Pacing Threshold Pulse Width: 0.5 ms
Lead Channel Pacing Threshold Pulse Width: 0.5 ms
Lead Channel Sensing Intrinsic Amplitude: 3.9 mV
Lead Channel Sensing Intrinsic Amplitude: 9.7 mV
Lead Channel Setting Pacing Amplitude: 1 V
Lead Channel Setting Pacing Amplitude: 2 V
Lead Channel Setting Pacing Pulse Width: 0.5 ms
Lead Channel Setting Sensing Sensitivity: 2 mV
Pulse Gen Model: 2272
Pulse Gen Serial Number: 9035930

## 2024-03-26 LAB — LAB REPORT - SCANNED: EGFR: 34

## 2024-04-05 NOTE — Progress Notes (Signed)
 [facility]  [address] [place],  [place]  [address] [phone]  Clinic Day:  04/06/2024  Referring physician: Conley Dene BROCKS, DO  HISTORY OF PRESENT ILLNESS:  The patient is a 89 y.o. female  with anemia secondary to kidney disease.  She has intermittently received Retacrit  injections to get her hemoglobin above 10.  She comes in today to reassess her anemia.  Since her last visit, the patient has been doing well.  She denies having any overt forms of blood loss since her last visit.  It was in November 2024 when she was hospitalized in [place] for severe anemia.  The patient recalls having 1 bloody bowel movement, which led to her seeking attention.  While in the hospital, her hemoglobin was as low as 5.9, for which she received 5 units of packed red blood cells.  PHYSICAL EXAM:  Blood pressure (!) 160/36, pulse 60, temperature 97.7 F (36.5 C), temperature source Oral, resp. rate 14, height 5' (1.524 m), SpO2 98%. Wt Readings from Last 3 Encounters:  04/10/24 102 lb 3.2 oz (46.4 kg)  01/03/24 100 lb 6.4 oz (45.5 kg)  09/17/23 102 lb (46.3 kg)   Body mass index is 19.61 kg/m. Performance status (ECOG): 3 - Symptomatic, >50% confined to bed Physical Exam Constitutional:      Appearance: Normal appearance. She is not ill-appearing.     Comments: She looks better today vs her more recent visits.  She is in a wheelchair.  HENT:     Mouth/Throat:     Mouth: Mucous membranes are moist.     Pharynx: Oropharynx is clear. No oropharyngeal exudate or posterior oropharyngeal erythema.  Cardiovascular:     Rate and Rhythm: Normal rate and regular rhythm.     Heart sounds: No murmur heard.    No friction rub. No gallop.  Pulmonary:     Effort: Pulmonary effort is normal. No respiratory distress.     Breath sounds: Normal breath sounds. No wheezing, rhonchi or rales.  Abdominal:     General: Bowel sounds are normal. There is no distension.      Palpations: Abdomen is soft. There is no mass.     Tenderness: There is no abdominal tenderness.  Musculoskeletal:        General: No swelling.     Right lower leg: No edema.     Left lower leg: No edema.  Lymphadenopathy:     Cervical: No cervical adenopathy.     Upper Body:     Right upper body: No supraclavicular or axillary adenopathy.     Left upper body: No supraclavicular or axillary adenopathy.     Lower Body: No right inguinal adenopathy. No left inguinal adenopathy.  Skin:    General: Skin is warm.     Coloration: Skin is not jaundiced.     Findings: No lesion or rash.  Neurological:     General: No focal deficit present.     Mental Status: She is alert and oriented to person, place, and time. Mental status is at baseline.  Psychiatric:        Mood and Affect: Mood normal.        Behavior: Behavior normal.        Thought Content: Thought content normal.    LABS:      Latest Ref Rng & Units 04/06/2024    1:31 PM 03/09/2024    1:08 PM 02/03/2024    1:19 PM  CBC  WBC  4.0 - 10.5 K/uL 6.6  6.9  6.7   Hemoglobin 12.0 - 15.0 g/dL 88.2  88.7  89.8   Hematocrit 36.0 - 46.0 % 37.2  34.9  32.6   Platelets 150 - 400 K/uL 190  204  218       Latest Ref Rng & Units 04/06/2024    1:31 PM 12/02/2023    1:11 PM 09/02/2023    1:32 PM  CMP  Glucose 70 - 99 mg/dL 93  80  97   BUN 8 - 23 mg/dL 34  35  28   Creatinine 0.44 - 1.00 mg/dL 8.38  8.61  8.35   Sodium 135 - 145 mmol/L 142  141  142   Potassium 3.5 - 5.1 mmol/L 4.5  4.5  4.4   Chloride 98 - 111 mmol/L 103  99  107   CO2 22 - 32 mmol/L 26  31  26    Calcium 8.9 - 10.3 mg/dL 9.7  89.2  9.5   Total Protein 6.5 - 8.1 g/dL 7.4  8.0  7.0   Total Bilirubin 0.0 - 1.2 mg/dL 0.4  0.3  0.3   Alkaline Phos 38 - 126 U/L 65  94  68   AST 15 - 41 U/L 28  29  30    ALT 0 - 44 U/L 7  12  15      Latest Reference Range & Units 04/06/24 13:31  Iron 28 - 170 ug/dL 91  UIBC ug/dL 715  TIBC 749 - 549 ug/dL 624  Saturation Ratios 10.4 - 31.8 %  24  Ferritin 11 - 307 ng/mL 101    ASSESSMENT & PLAN:  Assessment/Plan:  A 89 y.o. female with anemia secondary to chronic renal insufficiency.  There is also a recent history of GI blood from an unknown disclosed location.  When evaluating her labs today, I am pleased that her hemoglobin has improved over these past few months without any particular intervention.  Understandably, the patient was pleased with these results.  For now, her CBC will be followed every 2 months.  I will see her back in 4 months for repeat clinical assessment.  The patient understands all the plans discussed today and is in agreement with them.   Asma Boldon DELENA Kerns, MD

## 2024-04-06 ENCOUNTER — Inpatient Hospital Stay: Attending: Oncology | Admitting: Oncology

## 2024-04-06 ENCOUNTER — Inpatient Hospital Stay

## 2024-04-06 ENCOUNTER — Telehealth: Payer: Self-pay | Admitting: Oncology

## 2024-04-06 ENCOUNTER — Other Ambulatory Visit: Payer: Self-pay | Admitting: Oncology

## 2024-04-06 VITALS — BP 160/36 | HR 60 | Temp 97.7°F | Resp 14 | Ht 60.0 in

## 2024-04-06 DIAGNOSIS — N1832 Chronic kidney disease, stage 3b: Secondary | ICD-10-CM | POA: Diagnosis present

## 2024-04-06 DIAGNOSIS — D631 Anemia in chronic kidney disease: Secondary | ICD-10-CM | POA: Diagnosis not present

## 2024-04-06 DIAGNOSIS — N189 Chronic kidney disease, unspecified: Secondary | ICD-10-CM | POA: Diagnosis not present

## 2024-04-06 LAB — CBC WITH DIFFERENTIAL (CANCER CENTER ONLY)
Abs Immature Granulocytes: 0.03 K/uL (ref 0.00–0.07)
Basophils Absolute: 0 K/uL (ref 0.0–0.1)
Basophils Relative: 1 %
Eosinophils Absolute: 0.1 K/uL (ref 0.0–0.5)
Eosinophils Relative: 1 %
HCT: 37.2 % (ref 36.0–46.0)
Hemoglobin: 11.7 g/dL — ABNORMAL LOW (ref 12.0–15.0)
Immature Granulocytes: 1 %
Lymphocytes Relative: 28 %
Lymphs Abs: 1.9 K/uL (ref 0.7–4.0)
MCH: 30.6 pg (ref 26.0–34.0)
MCHC: 31.5 g/dL (ref 30.0–36.0)
MCV: 97.4 fL (ref 80.0–100.0)
Monocytes Absolute: 0.4 K/uL (ref 0.1–1.0)
Monocytes Relative: 6 %
Neutro Abs: 4.3 K/uL (ref 1.7–7.7)
Neutrophils Relative %: 63 %
Platelet Count: 190 K/uL (ref 150–400)
RBC: 3.82 MIL/uL — ABNORMAL LOW (ref 3.87–5.11)
RDW: 14.3 % (ref 11.5–15.5)
WBC Count: 6.6 K/uL (ref 4.0–10.5)
nRBC: 0 % (ref 0.0–0.2)

## 2024-04-06 LAB — CMP (CANCER CENTER ONLY)
ALT: 7 U/L (ref 0–44)
AST: 28 U/L (ref 15–41)
Albumin: 3.9 g/dL (ref 3.5–5.0)
Alkaline Phosphatase: 65 U/L (ref 38–126)
Anion gap: 13 (ref 5–15)
BUN: 34 mg/dL — ABNORMAL HIGH (ref 8–23)
CO2: 26 mmol/L (ref 22–32)
Calcium: 9.7 mg/dL (ref 8.9–10.3)
Chloride: 103 mmol/L (ref 98–111)
Creatinine: 1.61 mg/dL — ABNORMAL HIGH (ref 0.44–1.00)
GFR, Estimated: 29 mL/min — ABNORMAL LOW (ref >60)
Glucose, Bld: 93 mg/dL (ref 70–99)
Potassium: 4.5 mmol/L (ref 3.5–5.1)
Sodium: 142 mmol/L (ref 135–145)
Total Bilirubin: 0.4 mg/dL (ref 0.0–1.2)
Total Protein: 7.4 g/dL (ref 6.5–8.1)

## 2024-04-06 LAB — IRON AND TIBC
Iron: 91 ug/dL (ref 28–170)
Saturation Ratios: 24 % (ref 10.4–31.8)
TIBC: 375 ug/dL (ref 250–450)
UIBC: 284 ug/dL

## 2024-04-06 LAB — FERRITIN: Ferritin: 101 ng/mL (ref 11–307)

## 2024-04-06 NOTE — Telephone Encounter (Signed)
 Patient has been scheduled for follow-up visit per 04/06/24 LOS.  Pt given an appt calendar with date and time.

## 2024-04-07 ENCOUNTER — Ambulatory Visit: Admitting: Cardiology

## 2024-04-09 NOTE — Progress Notes (Signed)
 Cardiology Office Note:    Date:  04/10/2024   ID:  Kaitlin Villarreal, DOB 01-30-1926, MRN [id number]  PCP:  Conley Dene BROCKS, DO   [facility] Providers Cardiologist:  Redell Leiter, MD Electrophysiologist:  Will Gladis Norton, MD     Referring MD: Conley Dene BROCKS, DO   CC: follow up   History of Present Illness:    Kaitlin Villarreal is a 89 y.o. female with a hx of hypertension, complete heart block s/p Saint Jude dual-chamber PPM on 03/20/2018, chronic diastolic heart failure, ventral septal defect, severe aortic stenosis s/p TAVR 2019, history of TIAs, GERD, CKD stage III, anemia related to CKD, hyperlipidemia.  03/19/2024 device check device functioning appropriately 08/13/2023 echo EF greater than 50%, mild mitral regurgitation, mild aortic regurgitation with bioprosthetic aortic valve functioning normally, severe tricuspid valve regurgitation with RSVP severely elevated at 114 mmHg 11/15/2022 echo EF greater than 70%, mild concentric LVH, RV mildly enlarged, mild mitral valve annular calcification, mild to moderate MR, mild to moderate MS, mild AR. 620 2019 ppm implantation 03/18/2018 TAVR  Most recently evaluated by Dr. Norton on 01/18/2023, at this time she was doing well from an EP perspective and she was advised she could follow-up with EP in 12 months.  Remote pacemaker device check on 12/26/2022 had revealed abnormal device interrogation, noise noted on A lead.  She was admitted to the hospital on 08/20/2023 following a fall, syncopal event and GI bleed, her aspirin  was held for 7 days, she was advised to follow-up with her PCP.  Evaluated 09/17/2023 by myself, she was stable, no chest follow-up in 3 months.  Most recently she was evaluated by myself on 01/03/2024, stable from a cardiac perspective, we discussed repeating echocardiogram for surveillance of her severe TR however family/patient would not be interested in any intervention therefore politely declines.  No changes  were made to her medications or plan of care and she was advised to follow-up in 3 months.  She presents today accompanied by her daughter for follow-up.  She has been doing stable since she was last evaluated in our office, she does not offer any formal complaints. Her family takes excellent care of her. She has had an episode of dizziness, but quickly subsided. She stays well hydrated and enjoys painting.  She had a recent visit to the cancer center where her blood pressure was elevated however today it is well-controlled.  Past Medical History:  Diagnosis Date   ABLA (acute blood loss anemia) 08/21/2023   Abnormal CT scan, gallbladder 09/08/2020   Acute on chronic diastolic heart failure (HCC) 03/18/2018   Anemia in chronic kidney disease 11/08/2022   Aortic stenosis 10/14/2014   Formatting of this note might be different from the original.  moderate by echo 11/2015   Arthritis    Bilateral renal masses 07/26/2017   Bone spur of toe of left foot    Chronic diastolic (congestive) heart failure (HCC)    Chronic diastolic heart failure (HCC) 03/18/2018   Chronic renal insufficiency 04/10/2021   CKD (chronic kidney disease) stage 3, GFR 30-59 ml/min (HCC) 07/26/2017   Diverticulosis of colon with hemorrhage 08/22/2023   Dizziness 04/10/2021   Elevated brain natriuretic peptide (BNP) level 04/10/2021   Essential hypertension    Foot pain, bilateral 10/18/2021   GERD (gastroesophageal reflux disease)    GI bleed 08/20/2023   Hematochezia 08/21/2023   HLD (hyperlipidemia)    Hyperlipidemia 11/25/2016   Hypertension 04/10/2021   Hypertensive encephalopathy 04/10/2021  Non-pressure chronic ulcer of skin of other sites with unspecified severity (HCC) 09/17/2023   Open wound 04/10/2021   Osteoarthritis 04/10/2021   Pacemaker 09/02/2018   Pancreatic lesion 04/17/2018   Pancreatic mass    a. benign appearing but needs f/u, noted on pre TAVR CTs   Plantar fat pad atrophy of left foot     S/P TAVR (transcatheter aortic valve replacement) 03/18/2018   23 mm Edwards Sapien 3 transcatheter heart valve placed via percutaneous right transfemoral approach    Severe aortic stenosis    a. 03/2018: s/p TAVR by Wonda and Dr. Dusty   Stage 3 chronic kidney disease (HCC)    Stage 3 chronic kidney disease due to benign hypertension (HCC) 04/10/2021   TIA (transient ischemic attack)     a. 1992   VSD (ventricular septal defect)     Past Surgical History:  Procedure Laterality Date   ABDOMINAL HYSTERECTOMY     APPENDECTOMY     HERNIA REPAIR     INTRAOPERATIVE TRANSTHORACIC ECHOCARDIOGRAM N/A 03/18/2018   Procedure: INTRAOPERATIVE TRANSTHORACIC ECHOCARDIOGRAM;  Surgeon: Wonda Sharper, MD;  Location: [facility] OR;  Service: Open Heart Surgery;  Laterality: N/A;   PACEMAKER IMPLANT N/A 03/20/2018   Procedure: PACEMAKER IMPLANT;  Surgeon: Inocencio Soyla Lunger, MD;  Location: [facility] INVASIVE CV LAB;  Service: Cardiovascular;  Laterality: N/A;   RIGHT HEART CATH N/A 03/20/2018   Procedure: RIGHT HEART CATH;  Surgeon: Wonda Sharper, MD;  Location: [facility] INVASIVE CV LAB;  Service: Cardiovascular;  Laterality: N/A;   RIGHT/LEFT HEART CATH AND CORONARY ANGIOGRAPHY N/A 02/06/2018   Procedure: RIGHT/LEFT HEART CATH AND CORONARY ANGIOGRAPHY;  Surgeon: Wonda Sharper, MD;  Location: [facility] INVASIVE CV LAB;  Service: Cardiovascular;  Laterality: N/A;   TONSILLECTOMY     TRANSCATHETER AORTIC VALVE REPLACEMENT, TRANSFEMORAL N/A 03/18/2018   Procedure: TRANSCATHETER AORTIC VALVE REPLACEMENT, TRANSFEMORAL;  Surgeon: Wonda Sharper, MD;  Location: [facility] OR;  Service: Open Heart Surgery;  Laterality: N/A;   WISDOM TOOTH EXTRACTION      Current Medications: Current Meds  Medication Sig   allopurinol (ZYLOPRIM) 100 MG tablet Take 100 mg by mouth daily.   amLODipine  (NORVASC ) 5 MG tablet Take 5 mg by mouth daily.   aspirin  EC 81 MG tablet Take 1 tablet (81 mg total) by mouth daily.   Carboxymethylcellulose Sod PF (THERATEARS  PF) 0.25 % SOLN Place 1 drop into both eyes in the morning and at bedtime.   Cyanocobalamin  (B-12) 2500 MCG TABS Take 2,500 mcg by mouth daily.    dapagliflozin propanediol (FARXIGA) 5 MG TABS tablet Take by mouth daily.   estradiol  (ESTRACE ) 1 MG tablet Take 0.5 mg by mouth daily.   ferrous sulfate 324 MG TBEC Take 324 mg by mouth as directed.   furosemide  (LASIX ) 20 MG tablet Take 1 tablet (20 mg total) by mouth every Monday, Wednesday, and Friday.   lisinopril  (ZESTRIL ) 40 MG tablet Take 40 mg by mouth daily.   metoprolol  tartrate (LOPRESSOR ) 50 MG tablet Take 1 tablet by mouth twice daily   Multiple Vitamins-Minerals (MULTI-VITAMIN GUMMIES) CHEW Chew 2 tablets by mouth daily.    Omega-3 Fatty Acids (FISH OIL) 500 MG CAPS Take 1 capsule by mouth daily.   Vitamin D, Ergocalciferol, (DRISDOL) 1.25 MG (50000 UT) CAPS capsule Take 50,000 Units by mouth See admin instructions. Take 50,000 units by mouth 2 times a week- Mondays and Fridays     Allergies:   Clarithromycin, Latex, Levofloxacin, Azithromycin, Diphenhydramine  hcl, and Ivp dye [iodinated contrast media]  Social History   Socioeconomic History   Marital status: Married    Spouse name: Not on file   Number of children: Not on file   Years of education: Not on file   Highest education level: Not on file  Occupational History   Not on file  Tobacco Use   Smoking status: Never    Passive exposure: Never   Smokeless tobacco: Never  Vaping Use   Vaping status: Never Used  Substance and Sexual Activity   Alcohol  use: No   Drug use: No   Sexual activity: Not on file  Other Topics Concern   Not on file  Social History Narrative   Not on file   Social Drivers of Health   Financial Resource Strain: Not on file  Food Insecurity: No Food Insecurity (08/21/2023)   Hunger Vital Sign    Worried About Running Out of Food in the Last Year: Never true    Ran Out of Food in the Last Year: Never true  Transportation Needs: No  Transportation Needs (08/21/2023)   PRAPARE - Administrator, Civil Service (Medical): No    Lack of Transportation (Non-Medical): No  Physical Activity: Not on file  Stress: Not on file  Social Connections: Not on file     Family History: The patient's family history includes Congenital heart disease in her brother; Heart attack in her brother; Heart disease in her mother; Uterine cancer in her sister.  ROS:   Please see the history of present illness.     All other systems reviewed and are negative.  EKGs/Labs/Other Studies Reviewed:    The following studies were reviewed today: Cardiac Studies & Procedures   ______________________________________________________________________________________________ CARDIAC CATHETERIZATION  CARDIAC CATHETERIZATION 03/20/2018  Conclusion 1.  No significant left to right shunting based on absence of a significant O2 step up from the SVC to the pulmonary artery 2.  Elevated right and left heart intracardiac filling pressures consistent with acute on chronic diastolic heart failure  Plan: Serial echo follow-up of the patient's small perimembranous VSD, IV diuresis and medical therapy for treatment of congestive heart failure, permanent pacemaker placement per Dr. Inocencio to follow   CARDIAC CATHETERIZATION  CARDIAC CATHETERIZATION 02/06/2018  Conclusion  Ost 1st Diag lesion is 50% stenosed.  Prox Cx lesion is 40% stenosed.  There is severe aortic valve stenosis.  1.  Calcified coronary arteries without significant stenosis.  Mild proximal left circumflex stenosis, minimal irregularity of the RCA, minimal irregularity of the LAD and moderate stenosis of an ostial diagonal 2.  Severely calcified aortic valve with known severe aortic stenosis by noninvasive assessment 3.  Normal right heart hemodynamics with preserved cardiac output and normal intracardiac filling pressures  Recommendations: Continue multidisciplinary evaluation  for TAVR.  Medical therapy for mild nonobstructive CAD.  Findings Coronary Findings Diagnostic  Dominance: Right  Left Main There is mild diffuse disease throughout the vessel. The vessel is moderately calcified. The left main is calcified with no significant stenosis  Left Anterior Descending The vessel exhibits minimal luminal irregularities. The LAD is patent throughout.  The distal vessel has multiple angulated segments but there are no stenoses.  The origin of the first diagonal branch has 50% stenosis.  First Diagonal Branch Ost 1st Diag lesion is 50% stenosed.  Left Circumflex There is mild diffuse disease throughout the vessel. Prox Cx lesion is 40% stenosed. The lesion is moderately calcified.  First Obtuse Marginal Branch Vessel is moderate in size. There is mild disease  in the vessel.  Second Obtuse Marginal Branch There is mild disease in the vessel.  Right Coronary Artery Vessel is moderate in size. There is mild diffuse disease throughout the vessel. The origin of the RCA is calcified around the right cusp.  The vessel has moderate diffuse calcification.  The vessel is widely patent with minor diffuse irregularities noted but no significant stenosis.  Intervention  No interventions have been documented.   STRESS TESTS  ECHOCARDIOGRAM STRESS TEST 01/08/2018  Narrative *[facility]* [address] [place], [place] [address] [phone]  ------------------------------------------------------------------- Stress Echocardiography  Patient:    Jenea, Dake MR #:       [id number] Study Date: 01/08/2018 Gender:     F Age:        52 Height:     152.4 cm Weight:     54.5 kg BSA:        1.53 m^2 Pt. Status: Room:  ATTENDING    Default, Provider [phone] [facility]     Redell Leiter, MD REFERRING    Redell Leiter, MD REFERRING    Dottie Ponce Norleen PHEBE PERFORMING   [facility] SONOGRAPHER  Fairy [place],  RDCS  cc:  -------------------------------------------------------------------  ------------------------------------------------------------------- Indications:      Aortic stenosis 424.1.  ------------------------------------------------------------------- History:   PMH:   Murmur.  Aortic valve disease.  Risk factors: Hypertension.  ------------------------------------------------------------------- Study Conclusions  - Aortic valve: Valve area (VTI): 0.7 cm^2. Valve area (Vmax): 0.76 cm^2. Valve area (Vmean): 0.76 cm^2. - Stress: There was a normal resting blood pressure with a hypotensive response to stress. Functional capacity was decreased (greater than 40%). - Stress ECG conclusions: There were no stress arrhythmias or conduction abnormalities. The stress ECG was non-diagnostic.  ------------------------------------------------------------------- Labs, prior tests, procedures, and surgery: ECG.     Abnormal.  ------------------------------------------------------------------- Study data:   Study status:  Routine.  Consent:  The risks, benefits, and alternatives to the procedure were explained to the patient and informed consent was obtained.  Procedure:  Initial setup. The patient was brought to the laboratory. A baseline ECG was recorded. Surface ECG leads and automatic cuff blood pressure measurements were monitored. Treadmill exercise testing was performed using the modified Bruce protocol. The patient exercised for 4.5 min, to a maximal work rate of 3.4 mets. Exercise was terminated due to fatigue. Transthoracic stress echocardiography for assessment of valvular function. Image quality was adequate. Images were captured at baseline and peak exercise.  Study completion:  The patient tolerated the procedure well. There were no complications.          Modified Bruce protocol. Stress echocardiography.  Birthdate:  Patient birthdate: July 04, 1926.  Age: Patient is 89 yr  old.  Sex:  Gender: female.    BMI: 23.5 kg/m^2. Blood pressure:     181/72  Patient status:  Outpatient.  Study date:  Study date: 01/08/2018. Study time: 11:35 AM.  -------------------------------------------------------------------  ------------------------------------------------------------------- Aortic valve:  Resting gradient .  Post stress: Peak stress velocity 376 cm/s. mean velocity 297cm/s. Mean gradient 39 mmHG.  Doppler:     VTI ratio of LVOT to aortic valve: 0.22. Valve area (VTI): 0.7 cm^2. Indexed valve area (VTI): 0.46 cm^2/m^2. Peak velocity ratio of LVOT to aortic valve: 0.24. Valve area (Vmax): 0.76 cm^2. Indexed valve area (Vmax): 0.5 cm^2/m^2. Mean velocity ratio of LVOT to aortic valve: 0.24. Valve area (Vmean): 0.76 cm^2. Indexed valve area (Vmean): 0.5 cm^2/m^2. Resting mean gradient (S): 44 mm Hg. Peak gradient (S): 75  mm Hg.  ------------------------------------------------------------------- Mitral valve:   Doppler:     Peak gradient (D): 9 mm Hg.  ------------------------------------------------------------------- Baseline ECG:  Normal.  Normal sinus rhythm.  Nonspecific ST changes.  ------------------------------------------------------------------- Stress protocol:  +---------------------+---+------------+--------+ !Stage                !HR !BP (mmHg)   !Symptoms! +---------------------+---+------------+--------+ !Baseline             !70 !181/72 (108)!None    ! +---------------------+---+------------+--------+ !Stage 0              !105!------------!--------! +---------------------+---+------------+--------+ !Stage 1/2            !116!------------!--------! +---------------------+---+------------+--------+ !Immediate post stress!113!------------!--------! +---------------------+---+------------+--------+ !Recovery; 1 min      !88 !------------!--------! +---------------------+---+------------+--------+ !Recovery; 2 min      !82  !------------!--------! +---------------------+---+------------+--------+  ------------------------------------------------------------------- Stress results:   Maximal heart rate during stress was 116 bpm (89% of maximal predicted heart rate). The maximal predicted heart rate was 131 bpm.The target heart rate was achieved. The heart rate response to stress was normal. There was a normal resting blood pressure with a hypotensive response to stress. The rate-pressure product for the peak heart rate and blood pressure was 87329 mm Hg/min.  The patient experienced no chest pain during stress. Functional capacity was decreased (greater than 40%).  ------------------------------------------------------------------- Stress ECG:  There were no stress arrhythmias or conduction abnormalities.  The stress ECG was non-diagnostic.  ------------------------------------------------------------------- Measurements  Left ventricle                            Value          Reference LV ID, ED, PLAX chordal           (L)     33.4  mm       43 - 52 LV ID, ES, PLAX chordal           (L)     17.9  mm       23 - 38 LV fx shortening, PLAX chordal            46    %        >=29 LV PW thickness, ED                       10.2  mm       --------- IVS/LV PW ratio, ED               (H)     1.49           <=1.3 Stroke volume, 2D                         84    ml       --------- Stroke volume/bsa, 2D                     55    ml/m^2   ---------  Ventricular septum                        Value          Reference IVS thickness, ED                         15.2  mm       ---------  LVOT  Value          Reference LVOT ID, S                                20    mm       --------- LVOT area                                 3.14  cm^2     --------- LVOT peak velocity, S                     105   cm/s     --------- LVOT mean velocity, S                     75.8  cm/s     --------- LVOT  VTI, S                               26.8  cm       ---------  Aortic valve                              Value          Reference Aortic valve peak velocity, S             434   cm/s     --------- Aortic valve mean velocity, S             312   cm/s     --------- Aortic valve VTI, S                       121   cm       --------- Aortic mean gradient, S                   40    mm Hg    --------- Aortic peak gradient, S                   75    mm Hg    --------- VTI ratio, LVOT/AV                        0.22           --------- Aortic valve area, VTI                    0.7   cm^2     --------- Aortic valve area/bsa, VTI                0.46  cm^2/m^2 --------- Velocity ratio, peak, LVOT/AV             0.24           --------- Aortic valve area, peak velocity          0.76  cm^2     --------- Aortic valve area/bsa, peak               0.5   cm^2/m^2 --------- velocity Velocity ratio, mean, LVOT/AV             0.24           --------- Aortic  valve area, mean velocity          0.76  cm^2     --------- Aortic valve area/bsa, mean               0.5   cm^2/m^2 --------- velocity  Aorta                                     Value          Reference Aortic root ID, ED                        27    mm       ---------  Left atrium                               Value          Reference LA ID, A-P, ES                            36    mm       --------- LA ID/bsa, A-P                    (H)     2.36  cm/m^2   <=2.2  Mitral valve                              Value          Reference Mitral E-wave peak velocity               147   cm/s     --------- Mitral A-wave peak velocity               118   cm/s     --------- Mitral deceleration time                  194   ms       150 - 230 Mitral peak gradient, D                   9     mm Hg    --------- Mitral E/A ratio, peak                    1.2            ---------  Legend: (L)  and  (H)  mark values outside specified reference  range.  ------------------------------------------------------------------- Prepared and Electronically Authenticated by  Redell Leiter, MD 2019-04-10T13:52:39   ECHOCARDIOGRAM  ECHOCARDIOGRAM COMPLETE 08/12/2019  Narrative ECHOCARDIOGRAM REPORT    Patient Name:   MONAI HINDES Mareno   Date of Exam: 08/12/2019 Medical Rec #:  [id number]     Height:       60.0 in Accession #:    [id number]    Weight:       116.0 lb Date of Birth:  [birth year]     BSA:          1.48 m Patient Age:    89 years      BP:           130/50 mmHg Patient Gender: F  HR:           73 bpm. Exam Location:  [address]  Procedure: 2D Echo, Cardiac Doppler and Color Doppler  Indications:     Z95.2 Post TAVR evaluation  History:         Patient has prior history of Echocardiogram examinations, most recent 02/04/2019. TAVR. Hypertensive heart disease. 3rd degree AV block. VSD. Chronic kidney disease.  Sonographer:     Jon Hacker RCS Referring Phys:  [id number] LAMARR SAUNDERS THOMPSON Diagnosing Phys: Aleene Passe MD  IMPRESSIONS   1. Left ventricular ejection fraction, by visual estimation, is 60 to 65%. The left ventricle has normal function. There is mildly increased left ventricular hypertrophy. 2. There is a small VSD with left to right shunting present. 3. Global right ventricle has normal systolic function.The right ventricular size is normal. No increase in right ventricular wall thickness. 4. Left atrial size was normal. 5. Right atrial size was normal. 6. The mitral valve is normal in structure. Mild mitral valve regurgitation. Mild mitral stenosis. 7. The tricuspid valve is grossly normal. Tricuspid valve regurgitation mild-moderate. 8. Aortic valve regurgitation is not visualized. 9. The pulmonic valve was grossly normal. Pulmonic valve regurgitation is not visualized. 10. Severely elevated pulmonary artery systolic pressure. 11. The atrial septum is grossly normal.  FINDINGS Left  Ventricle: Left ventricular ejection fraction, by visual estimation, is 60 to 65%. The left ventricle has normal function. There is mildly increased left ventricular hypertrophy. There is a small VSD with left to right shunting present.  Right Ventricle: The right ventricular size is normal. No increase in right ventricular wall thickness. Global RV systolic function is has normal systolic function. The tricuspid regurgitant velocity is 4.69 m/s, and with an assumed right atrial pressure of 10 mmHg, the estimated right ventricular systolic pressure is severely elevated at 98.0 mmHg.  Left Atrium: Left atrial size was normal in size.  Right Atrium: Right atrial size was normal in size  Pericardium: There is no evidence of pericardial effusion.  Mitral Valve: The mitral valve is normal in structure. Mild mitral valve stenosis by observation. MV peak gradient, 14.0 mmHg. Mild mitral valve regurgitation.  Tricuspid Valve: The tricuspid valve is grossly normal. Tricuspid valve regurgitation mild-moderate.  Aortic Valve: The aortic valve has been repaired/replaced. Aortic valve regurgitation is not visualized. Aortic valve mean gradient measures 6.5 mmHg. Aortic valve peak gradient measures 13.4 mmHg. Aortic valve area, by VTI measures 2.36 cm. Homograft aortic valve valve is present in the aortic position.  Pulmonic Valve: The pulmonic valve was grossly normal. Pulmonic valve regurgitation is not visualized.  Aorta: The aortic root and ascending aorta are structurally normal, with no evidence of dilitation.  IAS/Shunts: The atrial septum is grossly normal.    LEFT VENTRICLE PLAX 2D LVIDd:         3.50 cm  Diastology LVIDs:         1.90 cm  LV e' lateral:   8.49 cm/s LV PW:         1.20 cm  LV E/e' lateral: 18.7 LV IVS:        1.30 cm  LV e' medial:    5.87 cm/s LVOT diam:     2.00 cm  LV E/e' medial:  27.1 LV SV:         40 ml LV SV Index:   26.48 LVOT Area:     3.14 cm   RIGHT  VENTRICLE RV Basal diam:  2.20 cm RV S prime:  12.40 cm/s TAPSE (M-mode): 1.9 cm RVSP:           91.0 mmHg  LEFT ATRIUM             Index       RIGHT ATRIUM           Index LA diam:        3.30 cm 2.23 cm/m  RA Pressure: 3.00 mmHg LA Vol (A2C):   50.4 ml 34.03 ml/m RA Area:     11.60 cm LA Vol (A4C):   28.9 ml 19.52 ml/m RA Volume:   23.80 ml  16.07 ml/m LA Biplane Vol: 38.7 ml 26.13 ml/m AORTIC VALVE AV Area (Vmax):    1.92 cm AV Area (Vmean):   1.97 cm AV Area (VTI):     2.36 cm AV Vmax:           183.00 cm/s AV Vmean:          119.500 cm/s AV VTI:            0.466 m AV Peak Grad:      13.4 mmHg AV Mean Grad:      6.5 mmHg LVOT Vmax:         112.00 cm/s LVOT Vmean:        74.900 cm/s LVOT VTI:          0.350 m LVOT/AV VTI ratio: 0.75  MITRAL VALVE                         TRICUSPID VALVE MV Area (PHT): 2.87 cm              TR Peak grad:   88.0 mmHg MV Peak grad:  14.0 mmHg             TR Vmax:        469.00 cm/s MV Mean grad:  6.0 mmHg              Estimated RAP:  3.00 mmHg MV Vmax:       1.87 m/s              RVSP:           91.0 mmHg MV Vmean:      115.0 cm/s MV VTI:        0.54 m                SHUNTS MV PHT:        76.56 msec            Systemic VTI:  0.35 m MV Decel Time: 264 msec              Systemic Diam: 2.00 cm MV E velocity: 159.00 cm/s 103 cm/s MV A velocity: 142.00 cm/s 70.3 cm/s MV E/A ratio:  1.12        1.5   Aleene Passe MD Electronically signed by Aleene Passe MD Signature Date/Time: 08/12/2019/5:28:06 PM    Final (Updated)   TEE  ECHO TEE 03/18/2018  Narrative *[place]* *[facility]* [address] [place], [place] [address] [phone]  ------------------------------------------------------------------- Transesophageal Echocardiography  Patient:    Lynde, Ludwig MR #:       [id number] Study Date: 03/18/2018 Gender:     F Age:        74 Height:     152.4 cm Weight:     54.4 kg BSA:  1.53  m^2 Pt. Status: Room:       2H24C  ADMITTING    Sudie Laine, M.D. ATTENDING    Ozell Fell, MD ORDERING     Ozell Fell, MD REFERRING    Ozell Fell, MD PERFORMING   Leim Moose, M.D. SONOGRAPHER  Ellouise Mose  cc:  ------------------------------------------------------------------- LV EF: 65% -   70%  ------------------------------------------------------------------- Indications:      Aortic stenosis 424.1.  ------------------------------------------------------------------- History:   Risk factors:  Hypertension. Dyslipidemia.  ------------------------------------------------------------------- Study Conclusions  - Left ventricle: Wall thickness was increased in a pattern of moderate LVH. Systolic function was vigorous. The estimated ejection fraction was in the range of 65% to 70%. Wall motion was normal; there were no regional wall motion abnormalities. - Aortic valve: There was severe regurgitation. Valve area (VTI): 0.77 cm^2. Valve area (Vmax): 0.8 cm^2. Valve area (Vmean): 2.37 cm^2. - Mitral valve: There was mild regurgitation. - Left atrium: No evidence of thrombus in the atrial cavity or appendage. No evidence of thrombus in the atrial cavity or appendage. - Right atrium: No evidence of thrombus in the atrial cavity or appendage.  Impressions:  - This was a periprocedural echocardiogram during a TAVR procedure. TTE was switched to TEE for concern of a VSD and a follow up TEE was done 5 hours later.  Native aortic valve was severely thickened and calcified with severely restricted leaflet openings. Peak/mean transaortic gradients were 68/41 mmHg consistent with severe aortic stenosis. A 23 mm Edwards-SAPIEN 3 valve was successfully deployed in the aortic position. Post-deployment gradients were 12/4 mmHg. No paravalvular leak. A small perimembranous VSD was noted with small left to right shunting. A TEE probe was inserted and confirmed the  diagnosis. There is trivial pericardial effusion. Mild mitral and tricuspid regurgitation.  5 hours later a limited TTE was performed. It showed unchanged small VSD, stable aortic valve with normal unchanged gradients. No paravalvular leak. Trivial pericardial effusion.  ------------------------------------------------------------------- Study data:   Study status:  Routine.  Consent:  The risks, benefits, and alternatives to the procedure were explained to the patient and informed consent was obtained.  Procedure:  Study started as a limited transthoracic, then TEE probe was inserted to evaluate for VSD. The patient reported no pain pre or post test. Initial setup. The patient was brought to the laboratory. Surface ECG leads were monitored. Sedation. Conscious sedation was administered by anesthesiology staff. Transesophageal echocardiography. Topical anesthesia was obtained using viscous lidocaine . An adult multiplane transesophageal probe was inserted by the anesthesiologistwithout difficulty. Image quality was adequate.  Study completion:  The patient tolerated the procedure well. There were no complications.          Diagnostic transesophageal echocardiography.  2D and color Doppler. Birthdate:  Patient birthdate: 03-06-1926.  Age:  Patient is 89 yr old.  Sex:  Gender: female.    BMI: 23.4 kg/m^2.  Blood pressure: 204/61  Patient status:  Inpatient.  Study date:  Study date: 03/18/2018. Study time: 07:34 AM.  Location:  Operating room.  -------------------------------------------------------------------  ------------------------------------------------------------------- Left ventricle:   Wall thickness was increased in a pattern of moderate LVH.   Systolic function was vigorous. The estimated ejection fraction was in the range of 65% to 70%. Wall motion was normal; there were no regional wall motion  abnormalities.  ------------------------------------------------------------------- Aortic valve:   Trileaflet; severely thickened, severely calcified leaflets. Cusp separation was normal.  Doppler:  There was severe regurgitation.    VTI ratio of LVOT to aortic valve: 1.2. Valve  area (VTI): 0.77 cm^2. Indexed valve area (VTI): 0.5 cm^2/m^2. Peak velocity ratio of LVOT to aortic valve: 0.92. Valve area (Vmax): 0.8 cm^2. Indexed valve area (Vmax): 0.52 cm^2/m^2. Mean velocity ratio of LVOT to aortic valve: 0.93. Valve area (Vmean): 2.37 cm^2. Indexed valve area (Vmean): 1.55 cm^2/m^2.    Mean gradient (S): 3 mm Hg. Peak gradient (S): 6 mm Hg.  ------------------------------------------------------------------- Aorta:  There was no atheroma. There was no evidence for dissection. Aortic root: The aortic root was not dilated. Ascending aorta: The ascending aorta was normal in size. Aortic arch: The aortic arch was normal in size. Descending aorta: The descending aorta was normal in size.  ------------------------------------------------------------------- Mitral valve:   Mildly thickened leaflets . Leaflet separation was normal.  Doppler:  There was mild regurgitation.  ------------------------------------------------------------------- Left atrium:  The atrium was normal in size.  No evidence of thrombus in the atrial cavity or appendage.  No evidence of thrombus in the atrial cavity or appendage. The appendage was morphologically a left appendage, multilobulated, and of normal size. Emptying velocity was normal.  ------------------------------------------------------------------- Right ventricle:  The cavity size was normal. Wall thickness was normal. Systolic function was normal.  ------------------------------------------------------------------- Pulmonic valve:    Structurally normal valve.  ------------------------------------------------------------------- Tricuspid valve:    Structurally normal valve.   Leaflet separation was normal.  Doppler:  There was mild regurgitation.  ------------------------------------------------------------------- Pulmonary artery:   The main pulmonary artery was normal-sized.  ------------------------------------------------------------------- Right atrium:  The atrium was normal in size.  No evidence of thrombus in the atrial cavity or appendage. The appendage was morphologically a right appendage.  ------------------------------------------------------------------- Pericardium:  There was no pericardial effusion.  ------------------------------------------------------------------- Measurements  Left ventricle                            Value          Reference LV ID, ED, PLAX chordal           (L)     28.7  mm       43 - 52 LV ID, ES, PLAX chordal           (L)     19.9  mm       23 - 38 LV fx shortening, PLAX chordal            31    %        >=29 LV PW thickness, ED                       14.3  mm       --------- IVS/LV PW ratio, ED                       0.93           <=1.3 Stroke volume, 2D                         76    ml       --------- Stroke volume/bsa, 2D                     50    ml/m^2   --------- LV end-diastolic volume, 1-p A4C          44    ml       --------- LV ejection fraction, 1-p A4C  66    %        --------- LV end-diastolic volume/bsa, 1-p          29    ml/m^2   --------- A4C LV end-diastolic volume, 2-p              48    ml       --------- LV end-systolic volume, 2-p               16    ml       --------- LV ejection fraction, 2-p                 68    %        --------- Stroke volume, 2-p                        32    ml       --------- LV end-diastolic volume/bsa, 2-p          31    ml/m^2   --------- LV end-systolic volume/bsa, 2-p           10    ml/m^2   --------- Stroke volume/bsa, 2-p                    21.1  ml/m^2   ---------  Ventricular septum                        Value           Reference IVS thickness, ED                         13.3  mm       ---------  LVOT                                      Value          Reference LVOT ID, S                                18    mm       --------- LVOT area                                 2.54  cm^2     --------- LVOT peak velocity, S                     109   cm/s     --------- LVOT mean velocity, S                     77.3  cm/s     --------- LVOT VTI, S                               30.1  cm       ---------  Aortic valve                              Value          Reference  Aortic valve peak velocity, S             118   cm/s     --------- Aortic valve mean velocity, S             82.7  cm/s     --------- Aortic valve VTI, S                       25    cm       --------- Aortic mean gradient, S                   3     mm Hg    --------- Aortic peak gradient, S                   6     mm Hg    --------- VTI ratio, LVOT/AV                        1.2            --------- Aortic valve area, VTI                    0.77  cm^2     --------- Aortic valve area/bsa, VTI                0.5   cm^2/m^2 --------- Velocity ratio, peak, LVOT/AV             0.92           --------- Aortic valve area, peak velocity          0.8   cm^2     --------- Aortic valve area/bsa, peak               0.52  cm^2/m^2 --------- velocity Velocity ratio, mean, LVOT/AV             0.93           --------- Aortic valve area, mean velocity          2.37  cm^2     --------- Aortic valve area/bsa, mean               1.55  cm^2/m^2 --------- velocity  Aorta                                     Value          Reference Aortic root ID, ED                        27    mm       --------- Ascending aorta ID, A-P, S                29    mm       ---------  Left atrium                               Value          Reference LA ID, A-P, ES                            24    mm       ---------  LA ID/bsa, A-P                            1.57  cm/m^2    <=2.2  Legend: (L)  and  (H)  mark values outside specified reference range.  ------------------------------------------------------------------- Prepared and Electronically Authenticated by  Leim Moose, M.D. 2019-06-18T16:24:45    CT SCANS  CT CORONARY MORPH W/CTA COR W/SCORE 02/20/2018  Addendum 02/20/2018  6:32 PM ADDENDUM REPORT: 02/20/2018 18:29  CLINICAL DATA:  89 year old female with severe aortic stenosis being evaluated for a TAVR procedure.  EXAM: Cardiac TAVR CT  TECHNIQUE: The patient was scanned on a [employer]. A 120 kV retrospective scan was triggered in the descending thoracic aorta at 111 HU's. Gantry rotation speed was 250 msecs and collimation was .6 mm. No beta blockade or nitro were given. The 3D data set was reconstructed in 5% intervals of the R-R cycle. Systolic and diastolic phases were analyzed on a dedicated work station using MPR, MIP and VRT modes. The patient received 80 cc of contrast.  FINDINGS: Aortic Valve: Trileaflet, severely thickened, moderately calcified aortic valve with mild calcifications extending asymmetrically into the LVOT under the right and left coronary cusps.  Aorta: Normal size with moderate diffuse calcifications and atherosclerosis, no dissection.  Sinotubular Junction: 24 x 24 mm  Ascending Thoracic Aorta: 29 x 29 mm  Aortic Arch: 23 x 23 mm  Descending Thoracic Aorta: 19 x 19 mm  Sinus of Valsalva Measurements:  Non-coronary: 31 mm  Right -coronary: 30 mm  Left -coronary: 29 mm  Sinus of Valsalva Height:  Right -coronary: 22 mm  Left -coronary: 21 mm  Coronary Artery Height above Annulus:  Left Main: 17 mm  Right Coronary: 18 mm  Virtual Basal Annulus Measurements:  Maximum/Minimum Diameter: 22.7 x 18.5 mm  Mean Diameter: 20.4 mm  Perimeter: 65.1 mm  Area: 326 mm2  Optimum Fluoroscopic Angle for Delivery: LAO 24 CAU 20  IMPRESSION: 1. Trileaflet, severely thickened,  moderately calcified aortic valve with mild calcifications extending asymmetrically into the LVOT under the right and left coronary cusps. Annular measurements suitable for delivery of a 20 mm Edwards-SAPIEN 3 valve or a 26 mm Medtronic Evolut R valve.  2. Sufficient coronary to annulus distance.  3. Optimum Fluoroscopic Angle for Delivery: LAO 24 CAU 20  4. No thrombus in the left atrial appendage.   Electronically Signed By: Leim Moose On: 02/20/2018 18:29  Narrative EXAM: OVER-READ INTERPRETATION  CT CHEST  The following report is an over-read performed by radiologist Dr. Toribio Aye of [facility] on 02/20/2018. This over-read does not include interpretation of cardiac or coronary anatomy or pathology. The coronary calcium score/coronary CTA interpretation by the cardiologist is attached.  COMPARISON:  None.  FINDINGS: Extracardiac findings will be described under separate dictation for contemporaneously obtained CTA chest, abdomen and pelvis.  IMPRESSION: Please see separate dictation for contemporaneously obtained CTA chest, abdomen and pelvis dated 02/20/2018 for full description of relevant extracardiac findings.  Electronically Signed: By: Toribio Aye M.D. On: 02/20/2018 15:14     ______________________________________________________________________________________________      EKG:  EKG is not ordered today.   Recent Labs: 04/06/2024: ALT 7; BUN 34; Creatinine 1.61; Hemoglobin 11.7; Platelet Count 190; Potassium 4.5; Sodium 142  Recent Lipid Panel    Component Value Date/Time   CHOL 217 (H) 04/10/2021 1357   TRIG 180 (H) 04/10/2021 1357   HDL 78 04/10/2021 1357  CHOLHDL 2.8 04/10/2021 1357   LDLCALC 108 (H) 04/10/2021 1357     Risk Assessment/Calculations:                Physical Exam:    VS:  BP 138/60   Pulse 66   Ht 5' (1.524 m)   Wt 102 lb 3.2 oz (46.4 kg)   SpO2 93%   BMI 19.96 kg/m     Wt Readings  from Last 3 Encounters:  04/10/24 102 lb 3.2 oz (46.4 kg)  01/03/24 100 lb 6.4 oz (45.5 kg)  09/17/23 102 lb (46.3 kg)     GEN: Appears younger than stated age, thin but well nourished, well developed in no acute distress HEENT: Normal NECK: No JVD; No carotid bruits LYMPHATICS: No lymphadenopathy CARDIAC: RRR, 3/6 systolic murmur, rubs, gallops RESPIRATORY:  Clear to auscultation without rales, wheezing or rhonchi  ABDOMEN: Soft, non-tender, non-distended MUSCULOSKELETAL: Trace pedal edema left greater than right; No deformity  SKIN: Warm and dry NEUROLOGIC:  Alert and oriented x 3 PSYCHIATRIC:  Normal affect   ASSESSMENT:    1. Chronic heart failure with preserved ejection fraction (HCC)   2. Complete AV block (HCC)   3. Presence of cardiac pacemaker   4. S/P TAVR (transcatheter aortic valve replacement)   5. Essential hypertension   6. Severe tricuspid regurgitation       PLAN:    In order of problems listed above:  Chronic diastolic heart failure -most recent echo 08/2023 revealed revealed an EF greater than 50%, NYHA class I, euvolemic.  Continue metoprolol  50 mg daily, continue lisinopril  40 mg daily.  Continue to weigh daily, continue Lasix  low-dose Monday Wednesday Friday.  Labs by PCP on 03/26/2024 revealed creatinine 1.4, potassium 4.7.  Complete AV block/s/p pacemaker - followed by EP, will see Dr. Inocencio on 05/11/2024.  S/P TAVR - 3/6 murmur noted, most recent echo revealed mild AR.  Severe tricuspid regurgitation-she is asymptomatic, we did discuss repeating an echocardiogram for surveillance however her daughter politely declines, they would not be interested in any surgical intervention.  HTN -blood pressure is well-controlled at 138/60, continue Norvasc  5 mg daily, continue lisinopril  40 mg daily, continue metoprolol  50 mg twice daily.  Stage III CKD-careful titration of antihypertensive agents and diuretics.   Disposition-follow-up in 3 months with Dr.  Monetta.       Medication Adjustments/Labs and Tests Ordered: Current medicines are reviewed at length with the patient today.  Concerns regarding medicines are outlined above.  No orders of the defined types were placed in this encounter.  No orders of the defined types were placed in this encounter.   Patient Instructions  Medication Instructions:   No changes   *If you need a refill on your cardiac medications before your next appointment, please call your pharmacy*  Lab Work:  No changes   If you have labs (blood work) drawn today and your tests are completely normal, you will receive your results only by: MyChart Message (if you have MyChart) OR A paper copy in the mail If you have any lab test that is abnormal or we need to change your treatment, we will call you to review the results.  Testing/Procedures:  None  Follow-Up: At [facility], you and your health needs are our priority.  As part of our continuing mission to provide you with exceptional heart care, our providers are all part of one team.  This team includes your primary Cardiologist (physician) and Advanced Practice Providers or  APPs (Physician Assistants and Nurse Practitioners) who all work together to provide you with the care you need, when you need it.  Your next appointment:   3 month(s)  Provider:   Redell Leiter, MD    We recommend signing up for the patient portal called MyChart.  Sign up information is provided on this After Visit Summary.  MyChart is used to connect with patients for Virtual Visits (Telemedicine).  Patients are able to view lab/test results, encounter notes, upcoming appointments, etc.  Non-urgent messages can be sent to your provider as well.   To learn more about what you can do with MyChart, go to [email or web address].   Other Instructions         Signed, Delon JAYSON Hoover, NP  04/10/2024 4:05 PM    [facility]

## 2024-04-10 ENCOUNTER — Ambulatory Visit: Attending: Cardiology | Admitting: Cardiology

## 2024-04-10 ENCOUNTER — Encounter: Payer: Self-pay | Admitting: Cardiology

## 2024-04-10 VITALS — BP 138/60 | HR 66 | Ht 60.0 in | Wt 102.2 lb

## 2024-04-10 DIAGNOSIS — I5032 Chronic diastolic (congestive) heart failure: Secondary | ICD-10-CM | POA: Diagnosis present

## 2024-04-10 DIAGNOSIS — I10 Essential (primary) hypertension: Secondary | ICD-10-CM

## 2024-04-10 DIAGNOSIS — I071 Rheumatic tricuspid insufficiency: Secondary | ICD-10-CM

## 2024-04-10 DIAGNOSIS — Z950 Presence of cardiac pacemaker: Secondary | ICD-10-CM

## 2024-04-10 DIAGNOSIS — Z952 Presence of prosthetic heart valve: Secondary | ICD-10-CM | POA: Diagnosis present

## 2024-04-10 DIAGNOSIS — I442 Atrioventricular block, complete: Secondary | ICD-10-CM

## 2024-04-10 NOTE — Patient Instructions (Signed)
 Medication Instructions:   No changes   *If you need a refill on your cardiac medications before your next appointment, please call your pharmacy*  Lab Work:  No changes   If you have labs (blood work) drawn today and your tests are completely normal, you will receive your results only by: MyChart Message (if you have MyChart) OR A paper copy in the mail If you have any lab test that is abnormal or we need to change your treatment, we will call you to review the results.  Testing/Procedures:  None  Follow-Up: At [facility], you and your health needs are our priority.  As part of our continuing mission to provide you with exceptional heart care, our providers are all part of one team.  This team includes your primary Cardiologist (physician) and Advanced Practice Providers or APPs (Physician Assistants and Nurse Practitioners) who all work together to provide you with the care you need, when you need it.  Your next appointment:   3 month(s)  Provider:   Redell Leiter, MD    We recommend signing up for the patient portal called MyChart.  Sign up information is provided on this After Visit Summary.  MyChart is used to connect with patients for Virtual Visits (Telemedicine).  Patients are able to view lab/test results, encounter notes, upcoming appointments, etc.  Non-urgent messages can be sent to your provider as well.   To learn more about what you can do with MyChart, go to [email or web address].   Other Instructions

## 2024-04-24 ENCOUNTER — Encounter: Payer: Self-pay | Admitting: Oncology

## 2024-05-11 ENCOUNTER — Encounter: Payer: Self-pay | Admitting: Cardiology

## 2024-05-11 ENCOUNTER — Ambulatory Visit: Attending: Cardiology | Admitting: Cardiology

## 2024-05-11 VITALS — BP 148/50 | HR 62 | Ht 60.0 in | Wt 103.0 lb

## 2024-05-11 DIAGNOSIS — I442 Atrioventricular block, complete: Secondary | ICD-10-CM | POA: Diagnosis not present

## 2024-05-11 DIAGNOSIS — Z952 Presence of prosthetic heart valve: Secondary | ICD-10-CM | POA: Insufficient documentation

## 2024-05-11 DIAGNOSIS — I10 Essential (primary) hypertension: Secondary | ICD-10-CM | POA: Insufficient documentation

## 2024-05-11 LAB — CUP PACEART INCLINIC DEVICE CHECK
Battery Remaining Longevity: 69 mo
Battery Voltage: 2.99 V
Brady Statistic RA Percent Paced: 23 %
Brady Statistic RV Percent Paced: 0.55 %
Date Time Interrogation Session: 20250811172324
Implantable Lead Connection Status: 753985
Implantable Lead Connection Status: 753985
Implantable Lead Implant Date: 20190620
Implantable Lead Implant Date: 20190620
Implantable Lead Location: 753859
Implantable Lead Location: 753860
Implantable Pulse Generator Implant Date: 20190620
Lead Channel Impedance Value: 375 Ohm
Lead Channel Impedance Value: 450 Ohm
Lead Channel Pacing Threshold Amplitude: 0.5 V
Lead Channel Pacing Threshold Amplitude: 0.5 V
Lead Channel Pacing Threshold Amplitude: 0.5 V
Lead Channel Pacing Threshold Amplitude: 0.75 V
Lead Channel Pacing Threshold Amplitude: 0.75 V
Lead Channel Pacing Threshold Pulse Width: 0.5 ms
Lead Channel Pacing Threshold Pulse Width: 0.5 ms
Lead Channel Pacing Threshold Pulse Width: 0.5 ms
Lead Channel Pacing Threshold Pulse Width: 0.5 ms
Lead Channel Pacing Threshold Pulse Width: 0.5 ms
Lead Channel Sensing Intrinsic Amplitude: 10.9 mV
Lead Channel Sensing Intrinsic Amplitude: 4.5 mV
Lead Channel Setting Pacing Amplitude: 1 V
Lead Channel Setting Pacing Amplitude: 2 V
Lead Channel Setting Pacing Pulse Width: 0.5 ms
Lead Channel Setting Sensing Sensitivity: 2 mV
Pulse Gen Model: 2272
Pulse Gen Serial Number: 9035930

## 2024-05-11 NOTE — Progress Notes (Signed)
  Electrophysiology Office Note:   Date:  05/11/2024  ID:  Kaitlin Villarreal, DOB 12/30/1925, MRN [id number]  Primary Cardiologist: Redell Leiter, MD Primary Heart Failure: None Electrophysiologist: Jarris Kortz Gladis Norton, MD      History of Present Illness:   Kaitlin Villarreal is a 89 y.o. female with h/o complete heart block, severe aortic stenosis, VSD, chronic diastolic heart failure, hypertension seen today for routine electrophysiology followup.   Since last being seen in our clinic the patient reports doing well.  She has no chest pain or shortness of breath.  She is able to do all of her daily activities.  Last week, her blood pressure went up significantly.  She felt poor and went to the emergency room.  She was given hydralazine  which improved her blood pressure.  She has follow-up with her primary physician tomorrow.  she denies chest pain, palpitations, dyspnea, PND, orthopnea, nausea, vomiting, dizziness, syncope, edema, weight gain, or early satiety.   Review of systems complete and found to be negative unless listed in HPI.      EP Information / Studies Reviewed:    EKG is ordered today. Personal review as below.  EKG Interpretation Date/Time:  Monday May 11 2024 16:10:38 EDT Ventricular Rate:  62 PR Interval:  198 QRS Duration:  82 QT Interval:  422 QTC Calculation: 428 R Axis:   -10  Text Interpretation: Atrial-paced rhythm with Fusion complexes Nonspecific ST abnormality When compared with ECG of 21-Aug-2023 00:13, Electronic atrial pacemaker has replaced Sinus rhythm Nonspecific T wave abnormality no longer evident in Anterior leads Confirmed by Kamyra Schroeck ([id number]) on 05/11/2024 4:29:24 PM   PPM Interrogation-  reviewed in detail today,  See PACEART report.  Device History: Abbott Dual Chamber PPM implanted 03/20/2018 for CHB  Risk Assessment/Calculations:         Physical Exam:   VS:  BP (!) 148/50   Pulse 62   Ht 5' (1.524 m)   Wt 103 lb (46.7 kg)   SpO2 96%    BMI 20.12 kg/m    Wt Readings from Last 3 Encounters:  05/11/24 103 lb (46.7 kg)  04/10/24 102 lb 3.2 oz (46.4 kg)  01/03/24 100 lb 6.4 oz (45.5 kg)     GEN: Well nourished, well developed in no acute distress NECK: No JVD; No carotid bruits CARDIAC: Regular rhythm, 2 out of 6 systolic murmur at the base RESPIRATORY:  Clear to auscultation without rales, wheezing or rhonchi  ABDOMEN: Soft, non-tender, non-distended EXTREMITIES:  No edema; No deformity   ASSESSMENT AND PLAN:    CHB s/p Abbott PPM  Normal PPM function See Pace Art report Sensing threshold impedance within normal limits Programming appropriate No changes today  2.  Severe stenosis: Post TAVR.  Plan per primary cardiology  3.  Chronic diastolic heart failure: No obvious volume overload  4.  Hypertension: Elevated today.  She has follow-up with her primary physician tomorrow.  No changes at this time.  Disposition:   Follow up with Dr. Norton in 12 months  Signed, Elfida Shimada Gladis Norton, MD

## 2024-05-11 NOTE — Patient Instructions (Signed)
 Medication Instructions:  Your physician recommends that you continue on your current medications as directed. Please refer to the Current Medication list given to you today.  *If you need a refill on your cardiac medications before your next appointment, please call your pharmacy*  Lab Work: None ordered.  You may go to any [facility] Location for your lab work:  [facility] - [address] ([facility]) - [phone] [address]  [place] - [address]   [place]  - [address]   [place] - [address] - [address] [employer]  - [address] ([facility]   If you have labs (blood work) drawn today and your tests are completely normal, you will receive your results only by: [employer] (if you have MyChart)  If you have any lab test that is abnormal or we need to change your treatment, we will call you or send a MyChart message to review the results.  Testing/Procedures: None ordered.  Follow-Up: At [facility], you and your health needs are our priority.  As part of our continuing mission to provide you with exceptional heart care, we have created designated Provider Care Teams.  These Care Teams include your primary Cardiologist (physician) and Advanced Practice Providers (APPs -  Physician Assistants and Nurse Practitioners) who all work together to provide you with the care you need, when you need it.  We recommend signing up for the patient portal called MyChart.  Sign up information is provided on this After Visit Summary.  MyChart is used to connect with patients for Virtual Visits (Telemedicine).  Patients are able to view lab/test results, encounter notes, upcoming appointments, etc.  Non-urgent messages can be sent to your provider as well.   To learn more about what you can do with MyChart, go to  [email or web address].    Your next appointment:   1 year(s)  The format for your next appointment:   In Person  Provider:   Soyla Norton, MD or one of the following Advanced Practice Providers on your designated Care Team:   Charlies Arthur, [place] Ozell Jodie Passey, [place] Leotis Barrack, NP  Note: Remote monitoring is used to monitor your Pacemaker/ ICD from home. This monitoring reduces the number of office visits required to check your device to one time per year. It allows us  to keep an eye on the functioning of your device to ensure it is working properly.

## 2024-05-12 ENCOUNTER — Ambulatory Visit: Payer: Self-pay | Admitting: Cardiology

## 2024-05-27 NOTE — Progress Notes (Signed)
 Remote pacemaker transmission.

## 2024-05-27 NOTE — Addendum Note (Signed)
 Addended by: VICCI SELLER A on: 05/27/2024 03:21 PM   Modules accepted: Orders

## 2024-06-08 ENCOUNTER — Inpatient Hospital Stay: Attending: Oncology

## 2024-06-08 DIAGNOSIS — N1832 Chronic kidney disease, stage 3b: Secondary | ICD-10-CM | POA: Insufficient documentation

## 2024-06-08 DIAGNOSIS — D631 Anemia in chronic kidney disease: Secondary | ICD-10-CM | POA: Insufficient documentation

## 2024-06-08 LAB — CBC WITH DIFFERENTIAL (CANCER CENTER ONLY)
Abs Immature Granulocytes: 0.05 K/uL (ref 0.00–0.07)
Basophils Absolute: 0.1 K/uL (ref 0.0–0.1)
Basophils Relative: 1 %
Eosinophils Absolute: 0.2 K/uL (ref 0.0–0.5)
Eosinophils Relative: 2 %
HCT: 37.9 % (ref 36.0–46.0)
Hemoglobin: 12 g/dL (ref 12.0–15.0)
Immature Granulocytes: 1 %
Lymphocytes Relative: 16 %
Lymphs Abs: 1.5 K/uL (ref 0.7–4.0)
MCH: 30.8 pg (ref 26.0–34.0)
MCHC: 31.7 g/dL (ref 30.0–36.0)
MCV: 97.4 fL (ref 80.0–100.0)
Monocytes Absolute: 0.3 K/uL (ref 0.1–1.0)
Monocytes Relative: 4 %
Neutro Abs: 7.2 K/uL (ref 1.7–7.7)
Neutrophils Relative %: 76 %
Platelet Count: 274 K/uL (ref 150–400)
RBC: 3.89 MIL/uL (ref 3.87–5.11)
RDW: 14.1 % (ref 11.5–15.5)
WBC Count: 9.3 K/uL (ref 4.0–10.5)
nRBC: 0 % (ref 0.0–0.2)

## 2024-06-17 ENCOUNTER — Ambulatory Visit (INDEPENDENT_AMBULATORY_CARE_PROVIDER_SITE_OTHER): Payer: Medicare Other

## 2024-06-17 DIAGNOSIS — I442 Atrioventricular block, complete: Secondary | ICD-10-CM | POA: Diagnosis not present

## 2024-06-18 LAB — CUP PACEART REMOTE DEVICE CHECK
Battery Remaining Longevity: 62 mo
Battery Remaining Percentage: 50 %
Battery Voltage: 2.99 V
Brady Statistic AP VP Percent: 1 %
Brady Statistic AP VS Percent: 14 %
Brady Statistic AS VP Percent: 1 %
Brady Statistic AS VS Percent: 86 %
Brady Statistic RA Percent Paced: 14 %
Brady Statistic RV Percent Paced: 1 %
Date Time Interrogation Session: 20250917020013
Implantable Lead Connection Status: 753985
Implantable Lead Connection Status: 753985
Implantable Lead Implant Date: 20190620
Implantable Lead Implant Date: 20190620
Implantable Lead Location: 753859
Implantable Lead Location: 753860
Implantable Pulse Generator Implant Date: 20190620
Lead Channel Impedance Value: 340 Ohm
Lead Channel Impedance Value: 410 Ohm
Lead Channel Pacing Threshold Amplitude: 0.5 V
Lead Channel Pacing Threshold Amplitude: 0.875 V
Lead Channel Pacing Threshold Pulse Width: 0.5 ms
Lead Channel Pacing Threshold Pulse Width: 0.5 ms
Lead Channel Sensing Intrinsic Amplitude: 3.6 mV
Lead Channel Sensing Intrinsic Amplitude: 9.6 mV
Lead Channel Setting Pacing Amplitude: 1.125
Lead Channel Setting Pacing Amplitude: 2 V
Lead Channel Setting Pacing Pulse Width: 0.5 ms
Lead Channel Setting Sensing Sensitivity: 2 mV
Pulse Gen Model: 2272
Pulse Gen Serial Number: 9035930

## 2024-06-22 ENCOUNTER — Ambulatory Visit: Payer: Self-pay | Admitting: Cardiology

## 2024-06-22 NOTE — Progress Notes (Signed)
 Remote PPM Transmission

## 2024-06-24 ENCOUNTER — Telehealth: Payer: Self-pay

## 2024-06-24 NOTE — Telephone Encounter (Signed)
 2026 renewal  PAP: Patient assistance application for Farxiga through [employer] ([employer]) has been mailed to pt's home address on file. Provider portion of application will be faxed to provider's office.  Provider portion of application will be faxed to Dr. [place] Nabors

## 2024-07-02 NOTE — Telephone Encounter (Signed)
 PAP: Application for Kaitlin Villarreal has been submitted to [employer] ([employer]), via fax

## 2024-07-04 DIAGNOSIS — K5790 Diverticulosis of intestine, part unspecified, without perforation or abscess without bleeding: Secondary | ICD-10-CM | POA: Insufficient documentation

## 2024-07-04 DIAGNOSIS — E860 Dehydration: Secondary | ICD-10-CM | POA: Insufficient documentation

## 2024-07-04 DIAGNOSIS — R011 Cardiac murmur, unspecified: Secondary | ICD-10-CM | POA: Insufficient documentation

## 2024-07-04 DIAGNOSIS — K5792 Diverticulitis of intestine, part unspecified, without perforation or abscess without bleeding: Secondary | ICD-10-CM | POA: Insufficient documentation

## 2024-07-10 ENCOUNTER — Ambulatory Visit: Admitting: Cardiology

## 2024-07-14 NOTE — Progress Notes (Signed)
   Pharmacy Medication Assistance Program Note    07/14/2024  Patient ID: Kaitlin Villarreal, female   DOB: 06-Jul-1926, 89 y.o.   MRN: [id number]     02/20/2024 06/24/2024  Outreach Medication One  Initial Outreach Date (Medication One)  06/24/2024  Manufacturer Medication One [employer]  [employer] [facility] Farxiga  Dose of Farxiga 10mg  5mg   Type of Forensic scientist Assistance  Date Application Sent to Patient 02/20/2024 06/25/2024  Application Items Requested Application;Proof of Income Application  Date Application Sent to Prescriber 02/20/2024 07/08/2024  Name of Prescriber Dene Livings [place] Nabors  Date Application Received From Patient 02/25/2024 07/02/2024  Application Items Received From Patient Application Application  Date Application Received From Provider 02/25/2024 07/02/2024  Date Application Submitted to Manufacturer 02/25/2024 07/02/2024  Method Application Sent to Manufacturer Fax Fax  Patient Assistance Determination Approved Approved  Approval Start Date 02/25/2024 10/01/2024  Approval End Date 09/30/2024 09/30/2025  Patient Notification Method Telephone Call Telephone Call  Telephone Call Outcome  Successful     Signature

## 2024-07-14 NOTE — Telephone Encounter (Signed)
 PAP: Patient assistance application for Farxiga has been approved by PAP Companies: [employer] from 10/01/2024 to 09/30/2025. Medication should be delivered to PAP Delivery: Home. For further shipping updates, please contact [employer] ([employer]) at [phone]. Patient ID is: [id number]

## 2024-07-20 ENCOUNTER — Ambulatory Visit: Admitting: Cardiology

## 2024-07-22 NOTE — Progress Notes (Unsigned)
 Cardiology Office Note:    Date:  07/22/2024   ID:  Kaitlin Villarreal, DOB [birth year], MRN [id number]  PCP:  Conley Dene BROCKS, DO  Cardiologist:  Redell Leiter, MD    Referring MD: Conley Dene BROCKS, DO    ASSESSMENT:    1. S/P TAVR (transcatheter aortic valve replacement)   2. Complete AV block (HCC)   3. Presence of cardiac pacemaker   4. Stage 3 chronic kidney disease, unspecified whether stage 3a or 3b CKD (HCC)   5. 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 Anemia in chronic kidney disease, unspecified CKD stage    PLAN:    In order of problems listed above:  Stable from a valve perspective normal function and recent echocardiogram except for the paravalvular regurgitation but severe Stable pacemaker and heart block followed in our device clinic Heart failure is compensated blood pressure at target and stable CKD Anemia is improved after transfusion   Next appointment: 6 months   Medication Adjustments/Labs and Tests Ordered: Current medicines are reviewed at length with the patient today.  Concerns regarding medicines are outlined above.  No orders of the defined types were placed in this encounter.  No orders of the defined types were placed in this encounter.    History of Present Illness:    Kaitlin Villarreal is a 89 y.o. female with a hx of complex heart disease including symptomatic aortic stenosis with TAVR June 2009 teen permanent pacemaker after TAVR for heart block hypertensive heart disease with heart failure and stage III CKD anemia followed by hematology chronic kidney disease gout and gouty arthritis last seen by me 06/28/2023 and also follows up with EP with her pacemaker.  She had an admission at [facility] 07/04/2024 discharged 07/11/2024 with acute GI bleed with blood bright red rectal bleeding requiring 2 units of packed cell transfusion and subsequently stable H&H and stage III CKD.  She  had a vasovagal episode when she first presented to the hospital before transfusion.  In hospital her hypertension was labile and difficult to control.  She had an echocardiogram LV function ventricular septal defect TAVR function with mild transvalvular regurgitation she had a agitated saline injection and had scant right-to-left shunting mild elevation of pulmonary artery pressure  Compliance with diet, lifestyle and medications: Yes  Fortunately she has recovered from her recent GI bleed the family tells me she had a hemoglobin at her PCP office which was normal They are frustrated the third time she has blood they wonder why and I suspect it is because of the high velocity just both AR and VSD as a consequence of her TAVR and she has a consumptive coagulopathy von Willebrand's defect due to the high velocity jet.  These individuals tend to bleed mucosally unfortunately in her case it was not cured by AVR. She was given additional antihypertensive medications did not take them Now her blood pressure is well-controlled at home She is not having edema shortness of breath chest pain palpitation or syncope She is becoming increasingly frail Past Medical History:  Diagnosis Date   ABLA (acute blood loss anemia) 08/21/2023   Abnormal CT scan, gallbladder 09/08/2020   Acute on chronic diastolic heart failure (HCC) 03/18/2018   Anemia in chronic kidney disease 11/08/2022   Aortic stenosis 10/14/2014   Formatting of this note might be different from the original.  moderate by echo 11/2015   Arthritis  Bilateral renal masses 07/26/2017   Bone spur of toe of left foot    Chronic diastolic (congestive) heart failure (HCC)    Chronic diastolic heart failure (HCC) 03/18/2018   Chronic renal insufficiency 04/10/2021   CKD (chronic kidney disease) stage 3, GFR 30-59 ml/min (HCC) 07/26/2017   Diverticulosis 07/04/2024   Diverticulosis of colon with hemorrhage 08/22/2023   Dizziness 04/10/2021    Elevated brain natriuretic peptide (BNP) level 04/10/2021   Essential hypertension    Foot pain, bilateral 10/18/2021   GERD (gastroesophageal reflux disease)    GI bleed 08/20/2023   Heart murmur 07/04/2024   Hematochezia 08/21/2023   HLD (hyperlipidemia)    Hyperlipidemia 11/25/2016   Hypertension 04/10/2021   Hypertensive encephalopathy 04/10/2021   Non-pressure chronic ulcer of skin of other sites with unspecified severity (HCC) 09/17/2023   Open wound 04/10/2021   Osteoarthritis 04/10/2021   Pacemaker 09/02/2018   Pancreatic lesion 04/17/2018   Pancreatic mass    a. 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 03/2018: s/p TAVR by Wonda and Dr. Dusty   Stage 3 chronic kidney disease (HCC)    Stage 3 chronic kidney disease due to benign hypertension (HCC) 04/10/2021   TIA (transient ischemic attack)     a. 1992   VSD (ventricular septal defect)     Current Medications: No outpatient medications have been marked as taking for the 07/23/24 encounter (Appointment) with Monetta Redell PARAS, MD.      EKGs/Labs/Other Studies Reviewed:    The following studies were reviewed today:  Cardiac Studies & Procedures   ______________________________________________________________________________________________ CARDIAC CATHETERIZATION  CARDIAC CATHETERIZATION 03/20/2018  Conclusion 1.  No significant left to right shunting based on absence of a significant O2 step up from the SVC to the pulmonary artery 2.  Elevated right and left heart intracardiac filling pressures consistent with acute on chronic diastolic heart failure  Plan: Serial echo follow-up of the patient's small perimembranous VSD, IV diuresis and medical therapy for treatment  of congestive heart failure, permanent pacemaker placement per Dr. Inocencio to follow   CARDIAC CATHETERIZATION  CARDIAC CATHETERIZATION 02/06/2018  Conclusion  Ost 1st Diag lesion is 50% stenosed.  Prox Cx lesion is 40% stenosed.  There is severe aortic valve stenosis.  1.  Calcified coronary arteries without significant stenosis.  Mild proximal left circumflex stenosis, minimal irregularity of the RCA, minimal irregularity of the LAD and moderate stenosis of an ostial diagonal 2.  Severely calcified aortic valve with known severe aortic stenosis by noninvasive assessment 3.  Normal right heart hemodynamics with preserved cardiac output and normal intracardiac filling pressures  Recommendations: Continue multidisciplinary evaluation for TAVR.  Medical therapy for mild nonobstructive CAD.  Findings Coronary Findings Diagnostic  Dominance: Right  Left Main There is mild diffuse disease throughout the vessel. The vessel is moderately calcified. The left main is calcified with no significant stenosis  Left Anterior Descending The vessel exhibits minimal luminal irregularities. The LAD is patent throughout.  The distal vessel has multiple angulated segments but there are no stenoses.  The origin of the first diagonal branch has 50% stenosis.  First Diagonal Branch Ost 1st Diag lesion is 50% stenosed.  Left Circumflex There is mild diffuse disease throughout the  vessel. Prox Cx lesion is 40% stenosed. The lesion is moderately calcified.  First Obtuse Marginal Branch Vessel is moderate in size. There is mild disease in the vessel.  Second Obtuse Marginal Branch There is mild disease in the vessel.  Right Coronary Artery Vessel is moderate in size. There is mild diffuse disease throughout the vessel. The origin of the RCA is calcified around the right cusp.  The vessel has moderate diffuse calcification.  The vessel is widely patent with minor diffuse irregularities noted but no  significant stenosis.  Intervention  No interventions have been documented.   STRESS TESTS  ECHOCARDIOGRAM STRESS TEST 01/08/2018  Narrative *[facility]* [address] [place], [place] [address] [phone]  ------------------------------------------------------------------- Stress Echocardiography  Patient:    Kaitlin, Villarreal MR #:       [id number] Study Date: 01/08/2018 Gender:     F Age:        19 Height:     152.4 cm Weight:     54.5 kg BSA:        1.53 m^2 Pt. Status: Room:  ATTENDING    Default, Provider [phone] [facility]     Redell Leiter, MD REFERRING    Redell Leiter, MD REFERRING    Dottie Ponce Norleen PHEBE PERFORMING   [facility] [place] SONOGRAPHER  Fairy Canton, RDCS  cc:  -------------------------------------------------------------------  ------------------------------------------------------------------- Indications:      Aortic stenosis 424.1.  ------------------------------------------------------------------- History:   PMH:   Murmur.  Aortic valve disease.  Risk factors: Hypertension.  ------------------------------------------------------------------- Study Conclusions  - Aortic valve: Valve area (VTI): 0.7 cm^2. Valve area (Vmax): 0.76 cm^2. Valve area (Vmean): 0.76 cm^2. - Stress: There was a normal resting blood pressure with a hypotensive response to stress. Functional capacity was decreased (greater than 40%). - Stress ECG conclusions: There were no stress arrhythmias or conduction abnormalities. The stress ECG was non-diagnostic.  ------------------------------------------------------------------- Labs, prior tests, procedures, and surgery: ECG.     Abnormal.  ------------------------------------------------------------------- Study data:   Study status:  Routine.  Consent:  The risks, benefits, and alternatives to the procedure were explained to the patient and informed consent was obtained.  Procedure:   Initial setup. The patient was brought to the laboratory. A baseline ECG was recorded. Surface ECG leads and automatic cuff blood pressure measurements were monitored. Treadmill exercise testing was performed using the modified Bruce protocol. The patient exercised for 4.5 min, to a maximal work rate of 3.4 mets. Exercise was terminated due to fatigue. Transthoracic stress echocardiography for assessment of valvular function. Image quality was adequate. Images were captured at baseline and peak exercise.  Study completion:  The patient tolerated the procedure well. There were no complications.          Modified Bruce protocol. Stress echocardiography.  Birthdate:  Patient birthdate: 01-Jul-1926.  Age: Patient is 89 yr old.  Sex:  Gender: female.    BMI: 23.5 kg/m^2. Blood pressure:     181/72  Patient status:  Outpatient.  Study date:  Study date: 01/08/2018. Study time: 11:35 AM.  -------------------------------------------------------------------  ------------------------------------------------------------------- Aortic valve:  Resting gradient .  Post stress: Peak stress velocity 376 cm/s. mean velocity 297cm/s. Mean gradient 39 mmHG.  Doppler:     VTI ratio of LVOT to aortic valve: 0.22. Valve area (VTI): 0.7 cm^2. Indexed valve area (VTI): 0.46 cm^2/m^2. Peak velocity ratio of LVOT to aortic valve: 0.24. Valve area (Vmax): 0.76 cm^2. Indexed valve area (Vmax): 0.5 cm^2/m^2. Mean velocity ratio of LVOT  to aortic valve: 0.24. Valve area (Vmean): 0.76 cm^2. Indexed valve area (Vmean): 0.5 cm^2/m^2. Resting mean gradient (S): 44 mm Hg. Peak gradient (S): 75 mm Hg.  ------------------------------------------------------------------- Mitral valve:   Doppler:     Peak gradient (D): 9 mm Hg.  ------------------------------------------------------------------- Baseline ECG:  Normal.  Normal sinus rhythm.  Nonspecific  ST changes.  ------------------------------------------------------------------- Stress protocol:  +---------------------+---+------------+--------+ !Stage                !HR !BP (mmHg)   !Symptoms! +---------------------+---+------------+--------+ !Baseline             !70 !181/72 (108)!None    ! +---------------------+---+------------+--------+ !Stage 0              !105!------------!--------! +---------------------+---+------------+--------+ !Stage 1/2            !116!------------!--------! +---------------------+---+------------+--------+ !Immediate post stress!113!------------!--------! +---------------------+---+------------+--------+ !Recovery; 1 min      !88 !------------!--------! +---------------------+---+------------+--------+ !Recovery; 2 min      !82 !------------!--------! +---------------------+---+------------+--------+  ------------------------------------------------------------------- Stress results:   Maximal heart rate during stress was 116 bpm (89% of maximal predicted heart rate). The maximal predicted heart rate was 131 bpm.The target heart rate was achieved. The heart rate response to stress was normal. There was a normal resting blood pressure with a hypotensive response to stress. The rate-pressure product for the peak heart rate and blood pressure was 87329 mm Hg/min.  The patient experienced no chest pain during stress. Functional capacity was decreased (greater than 40%).  ------------------------------------------------------------------- Stress ECG:  There were no stress arrhythmias or conduction abnormalities.  The stress ECG was non-diagnostic.  ------------------------------------------------------------------- Measurements  Left ventricle                            Value          Reference LV ID, ED, PLAX chordal           (L)     33.4  mm       43 - 52 LV ID, ES, PLAX chordal           (L)     17.9  mm       23 - 38 LV fx shortening,  PLAX chordal            46    %        >=29 LV PW thickness, ED                       10.2  mm       --------- IVS/LV PW ratio, ED               (H)     1.49           <=1.3 Stroke volume, 2D                         84    ml       --------- Stroke volume/bsa, 2D                     55    ml/m^2   ---------  Ventricular septum                        Value          Reference IVS thickness, ED  15.2  mm       ---------  LVOT                                      Value          Reference LVOT ID, S                                20    mm       --------- LVOT area                                 3.14  cm^2     --------- LVOT peak velocity, S                     105   cm/s     --------- LVOT mean velocity, S                     75.8  cm/s     --------- LVOT VTI, S                               26.8  cm       ---------  Aortic valve                              Value          Reference Aortic valve peak velocity, S             434   cm/s     --------- Aortic valve mean velocity, S             312   cm/s     --------- Aortic valve VTI, S                       121   cm       --------- Aortic mean gradient, S                   40    mm Hg    --------- Aortic peak gradient, S                   75    mm Hg    --------- VTI ratio, LVOT/AV                        0.22           --------- Aortic valve area, VTI                    0.7   cm^2     --------- Aortic valve area/bsa, VTI                0.46  cm^2/m^2 --------- Velocity ratio, peak, LVOT/AV             0.24           --------- Aortic valve area, peak velocity          0.76  cm^2     --------- Aortic valve area/bsa, peak  0.5   cm^2/m^2 --------- velocity Velocity ratio, mean, LVOT/AV             0.24           --------- Aortic valve area, mean velocity          0.76  cm^2     --------- Aortic valve area/bsa, mean               0.5   cm^2/m^2 --------- velocity  Aorta                                      Value          Reference Aortic root ID, ED                        27    mm       ---------  Left atrium                               Value          Reference LA ID, A-P, ES                            36    mm       --------- LA ID/bsa, A-P                    (H)     2.36  cm/m^2   <=2.2  Mitral valve                              Value          Reference Mitral E-wave peak velocity               147   cm/s     --------- Mitral A-wave peak velocity               118   cm/s     --------- Mitral deceleration time                  194   ms       150 - 230 Mitral peak gradient, D                   9     mm Hg    --------- Mitral E/A ratio, peak                    1.2            ---------  Legend: (L)  and  (H)  mark values outside specified reference range.  ------------------------------------------------------------------- Prepared and Electronically Authenticated by  Redell Leiter, MD 2019-04-10T13:52:39   ECHOCARDIOGRAM  ECHOCARDIOGRAM COMPLETE 08/12/2019  Narrative ECHOCARDIOGRAM REPORT    Patient Name:   Kaitlin Villarreal   Date of Exam: 08/12/2019 Medical Rec #:  [id number]     Height:       60.0 in Accession #:    [id number]    Weight:       116.0 lb Date of Birth:  Apr 13, 1926     BSA:          1.48 m Patient Age:  89 years      BP:           130/50 mmHg Patient Gender: F             HR:           73 bpm. Exam Location:  [address]  Procedure: 2D Echo, Cardiac Doppler and Color Doppler  Indications:     Z95.2 Post TAVR evaluation  History:         Patient has prior history of Echocardiogram examinations, most recent 02/04/2019. TAVR. Hypertensive heart disease. 3rd degree AV block. VSD. Chronic kidney disease.  Sonographer:     Jon Hacker RCS Referring Phys:  [id number] LAMARR SAUNDERS THOMPSON Diagnosing Phys: Aleene Passe MD  IMPRESSIONS   1. Left ventricular ejection fraction, by visual estimation, is 60 to 65%. The left ventricle has normal function. There is  mildly increased left ventricular hypertrophy. 2. There is a small VSD with left to right shunting present. 3. Global right ventricle has normal systolic function.The right ventricular size is normal. No increase in right ventricular wall thickness. 4. Left atrial size was normal. 5. Right atrial size was normal. 6. The mitral valve is normal in structure. Mild mitral valve regurgitation. Mild mitral stenosis. 7. The tricuspid valve is grossly normal. Tricuspid valve regurgitation mild-moderate. 8. Aortic valve regurgitation is not visualized. 9. The pulmonic valve was grossly normal. Pulmonic valve regurgitation is not visualized. 10. Severely elevated pulmonary artery systolic pressure. 11. The atrial septum is grossly normal.  FINDINGS Left Ventricle: Left ventricular ejection fraction, by visual estimation, is 60 to 65%. The left ventricle has normal function. There is mildly increased left ventricular hypertrophy. There is a small VSD with left to right shunting present.  Right Ventricle: The right ventricular size is normal. No increase in right ventricular wall thickness. Global RV systolic function is has normal systolic function. The tricuspid regurgitant velocity is 4.69 m/s, and with an assumed right atrial pressure of 10 mmHg, the estimated right ventricular systolic pressure is severely elevated at 98.0 mmHg.  Left Atrium: Left atrial size was normal in size.  Right Atrium: Right atrial size was normal in size  Pericardium: There is no evidence of pericardial effusion.  Mitral Valve: The mitral valve is normal in structure. Mild mitral valve stenosis by observation. MV peak gradient, 14.0 mmHg. Mild mitral valve regurgitation.  Tricuspid Valve: The tricuspid valve is grossly normal. Tricuspid valve regurgitation mild-moderate.  Aortic Valve: The aortic valve has been repaired/replaced. Aortic valve regurgitation is not visualized. Aortic valve mean gradient measures 6.5 mmHg.  Aortic valve peak gradient measures 13.4 mmHg. Aortic valve area, by VTI measures 2.36 cm. Homograft aortic valve valve is present in the aortic position.  Pulmonic Valve: The pulmonic valve was grossly normal. Pulmonic valve regurgitation is not visualized.  Aorta: The aortic root and ascending aorta are structurally normal, with no evidence of dilitation.  IAS/Shunts: The atrial septum is grossly normal.    LEFT VENTRICLE PLAX 2D LVIDd:         3.50 cm  Diastology LVIDs:         1.90 cm  LV e' lateral:   8.49 cm/s LV PW:         1.20 cm  LV E/e' lateral: 18.7 LV IVS:        1.30 cm  LV e' medial:    5.87 cm/s LVOT diam:     2.00 cm  LV E/e' medial:  27.1 LV SV:  40 ml LV SV Index:   26.48 LVOT Area:     3.14 cm   RIGHT VENTRICLE RV Basal diam:  2.20 cm RV S prime:     12.40 cm/s TAPSE (M-mode): 1.9 cm RVSP:           91.0 mmHg  LEFT ATRIUM             Index       RIGHT ATRIUM           Index LA diam:        3.30 cm 2.23 cm/m  RA Pressure: 3.00 mmHg LA Vol (A2C):   50.4 ml 34.03 ml/m RA Area:     11.60 cm LA Vol (A4C):   28.9 ml 19.52 ml/m RA Volume:   23.80 ml  16.07 ml/m LA Biplane Vol: 38.7 ml 26.13 ml/m AORTIC VALVE AV Area (Vmax):    1.92 cm AV Area (Vmean):   1.97 cm AV Area (VTI):     2.36 cm AV Vmax:           183.00 cm/s AV Vmean:          119.500 cm/s AV VTI:            0.466 m AV Peak Grad:      13.4 mmHg AV Mean Grad:      6.5 mmHg LVOT Vmax:         112.00 cm/s LVOT Vmean:        74.900 cm/s LVOT VTI:          0.350 m LVOT/AV VTI ratio: 0.75  MITRAL VALVE                         TRICUSPID VALVE MV Area (PHT): 2.87 cm              TR Peak grad:   88.0 mmHg MV Peak grad:  14.0 mmHg             TR Vmax:        469.00 cm/s MV Mean grad:  6.0 mmHg              Estimated RAP:  3.00 mmHg MV Vmax:       1.87 m/s              RVSP:           91.0 mmHg MV Vmean:      115.0 cm/s MV VTI:        0.54 m                SHUNTS MV PHT:         76.56 msec            Systemic VTI:  0.35 m MV Decel Time: 264 msec              Systemic Diam: 2.00 cm MV E velocity: 159.00 cm/s 103 cm/s MV A velocity: 142.00 cm/s 70.3 cm/s MV E/A ratio:  1.12        1.5   Aleene Passe MD Electronically signed by Aleene Passe MD Signature Date/Time: 08/12/2019/5:28:06 PM    Final (Updated)   TEE  ECHO TEE 03/18/2018  Narrative *[place]* *[facility]* [address] [place], [place] [address] [phone]  ------------------------------------------------------------------- Transesophageal Echocardiography  Patient:    Kaitlin, Villarreal MR #:       [id number] Study Date: 03/18/2018 Gender:  F Age:        18 Height:     152.4 cm Weight:     54.4 kg BSA:        1.53 m^2 Pt. Status: Room:       2H24C  ADMITTING    Sudie Laine, M.D. ATTENDING    Ozell Fell, MD ORDERING     Ozell Fell, MD REFERRING    Ozell Fell, MD PERFORMING   Leim Moose, M.D. SONOGRAPHER  Ellouise Mose  cc:  ------------------------------------------------------------------- LV EF: 65% -   70%  ------------------------------------------------------------------- Indications:      Aortic stenosis 424.1.  ------------------------------------------------------------------- History:   Risk factors:  Hypertension. Dyslipidemia.  ------------------------------------------------------------------- Study Conclusions  - Left ventricle: Wall thickness was increased in a pattern of moderate LVH. Systolic function was vigorous. The estimated ejection fraction was in the range of 65% to 70%. Wall motion was normal; there were no regional wall motion abnormalities. - Aortic valve: There was severe regurgitation. Valve area (VTI): 0.77 cm^2. Valve area (Vmax): 0.8 cm^2. Valve area (Vmean): 2.37 cm^2. - Mitral valve: There was mild regurgitation. - Left atrium: No evidence of thrombus in the atrial cavity or appendage. No evidence  of thrombus in the atrial cavity or appendage. - Right atrium: No evidence of thrombus in the atrial cavity or appendage.  Impressions:  - This was a periprocedural echocardiogram during a TAVR procedure. TTE was switched to TEE for concern of a VSD and a follow up TEE was done 5 hours later.  Native aortic valve was severely thickened and calcified with severely restricted leaflet openings. Peak/mean transaortic gradients were 68/41 mmHg consistent with severe aortic stenosis. A 23 mm Edwards-SAPIEN 3 valve was successfully deployed in the aortic position. Post-deployment gradients were 12/4 mmHg. No paravalvular leak. A small perimembranous VSD was noted with small left to right shunting. A TEE probe was inserted and confirmed the diagnosis. There is trivial pericardial effusion. Mild mitral and tricuspid regurgitation.  5 hours later a limited TTE was performed. It showed unchanged small VSD, stable aortic valve with normal unchanged gradients. No paravalvular leak. Trivial pericardial effusion.  ------------------------------------------------------------------- Study data:   Study status:  Routine.  Consent:  The risks, benefits, and alternatives to the procedure were explained to the patient and informed consent was obtained.  Procedure:  Study started as a limited transthoracic, then TEE probe was inserted to evaluate for VSD. The patient reported no pain pre or post test. Initial setup. The patient was brought to the laboratory. Surface ECG leads were monitored. Sedation. Conscious sedation was administered by anesthesiology staff. Transesophageal echocardiography. Topical anesthesia was obtained using viscous lidocaine . An adult multiplane transesophageal probe was inserted by the anesthesiologistwithout difficulty. Image quality was adequate.  Study completion:  The patient tolerated the procedure well. There were no complications.           Diagnostic transesophageal echocardiography.  2D and color Doppler. Birthdate:  Patient birthdate: 11/02/1925.  Age:  Patient is 89 yr old.  Sex:  Gender: female.    BMI: 23.4 kg/m^2.  Blood pressure: 204/61  Patient status:  Inpatient.  Study date:  Study date: 03/18/2018. Study time: 07:34 AM.  Location:  Operating room.  -------------------------------------------------------------------  ------------------------------------------------------------------- Left ventricle:   Wall thickness was increased in a pattern of moderate LVH.   Systolic function was vigorous. The estimated ejection fraction was in the range of 65% to 70%. Wall motion was normal; there were no regional wall motion abnormalities.  ------------------------------------------------------------------- Aortic valve:  Trileaflet; severely thickened, severely calcified leaflets. Cusp separation was normal.  Doppler:  There was severe regurgitation.    VTI ratio of LVOT to aortic valve: 1.2. Valve area (VTI): 0.77 cm^2. Indexed valve area (VTI): 0.5 cm^2/m^2. Peak velocity ratio of LVOT to aortic valve: 0.92. Valve area (Vmax): 0.8 cm^2. Indexed valve area (Vmax): 0.52 cm^2/m^2. Mean velocity ratio of LVOT to aortic valve: 0.93. Valve area (Vmean): 2.37 cm^2. Indexed valve area (Vmean): 1.55 cm^2/m^2.    Mean gradient (S): 3 mm Hg. Peak gradient (S): 6 mm Hg.  ------------------------------------------------------------------- Aorta:  There was no atheroma. There was no evidence for dissection. Aortic root: The aortic root was not dilated. Ascending aorta: The ascending aorta was normal in size. Aortic arch: The aortic arch was normal in size. Descending aorta: The descending aorta was normal in size.  ------------------------------------------------------------------- Mitral valve:   Mildly thickened leaflets . Leaflet separation was normal.  Doppler:  There was mild  regurgitation.  ------------------------------------------------------------------- Left atrium:  The atrium was normal in size.  No evidence of thrombus in the atrial cavity or appendage.  No evidence of thrombus in the atrial cavity or appendage. The appendage was morphologically a left appendage, multilobulated, and of normal size. Emptying velocity was normal.  ------------------------------------------------------------------- Right ventricle:  The cavity size was normal. Wall thickness was normal. Systolic function was normal.  ------------------------------------------------------------------- Pulmonic valve:    Structurally normal valve.  ------------------------------------------------------------------- Tricuspid valve:   Structurally normal valve.   Leaflet separation was normal.  Doppler:  There was mild regurgitation.  ------------------------------------------------------------------- Pulmonary artery:   The main pulmonary artery was normal-sized.  ------------------------------------------------------------------- Right atrium:  The atrium was normal in size.  No evidence of thrombus in the atrial cavity or appendage. The appendage was morphologically a right appendage.  ------------------------------------------------------------------- Pericardium:  There was no pericardial effusion.  ------------------------------------------------------------------- Measurements  Left ventricle                            Value          Reference LV ID, ED, PLAX chordal           (L)     28.7  mm       43 - 52 LV ID, ES, PLAX chordal           (L)     19.9  mm       23 - 38 LV fx shortening, PLAX chordal            31    %        >=29 LV PW thickness, ED                       14.3  mm       --------- IVS/LV PW ratio, ED                       0.93           <=1.3 Stroke volume, 2D                         76    ml       --------- Stroke volume/bsa, 2D                     50     ml/m^2   --------- LV end-diastolic volume, 1-p  A4C          44    ml       --------- LV ejection fraction, 1-p A4C             66    %        --------- LV end-diastolic volume/bsa, 1-p          29    ml/m^2   --------- A4C LV end-diastolic volume, 2-p              48    ml       --------- LV end-systolic volume, 2-p               16    ml       --------- LV ejection fraction, 2-p                 68    %        --------- Stroke volume, 2-p                        32    ml       --------- LV end-diastolic volume/bsa, 2-p          31    ml/m^2   --------- LV end-systolic volume/bsa, 2-p           10    ml/m^2   --------- Stroke volume/bsa, 2-p                    21.1  ml/m^2   ---------  Ventricular septum                        Value          Reference IVS thickness, ED                         13.3  mm       ---------  LVOT                                      Value          Reference LVOT ID, S                                18    mm       --------- LVOT area                                 2.54  cm^2     --------- LVOT peak velocity, S                     109   cm/s     --------- LVOT mean velocity, S                     77.3  cm/s     --------- LVOT VTI, S                               30.1  cm       ---------  Aortic valve  Value          Reference Aortic valve peak velocity, S             118   cm/s     --------- Aortic valve mean velocity, S             82.7  cm/s     --------- Aortic valve VTI, S                       25    cm       --------- Aortic mean gradient, S                   3     mm Hg    --------- Aortic peak gradient, S                   6     mm Hg    --------- VTI ratio, LVOT/AV                        1.2            --------- Aortic valve area, VTI                    0.77  cm^2     --------- Aortic valve area/bsa, VTI                0.5   cm^2/m^2 --------- Velocity ratio, peak, LVOT/AV             0.92           --------- Aortic valve  area, peak velocity          0.8   cm^2     --------- Aortic valve area/bsa, peak               0.52  cm^2/m^2 --------- velocity Velocity ratio, mean, LVOT/AV             0.93           --------- Aortic valve area, mean velocity          2.37  cm^2     --------- Aortic valve area/bsa, mean               1.55  cm^2/m^2 --------- velocity  Aorta                                     Value          Reference Aortic root ID, ED                        27    mm       --------- Ascending aorta ID, A-P, S                29    mm       ---------  Left atrium                               Value          Reference LA ID, A-P, ES  24    mm       --------- LA ID/bsa, A-P                            1.57  cm/m^2   <=2.2  Legend: (L)  and  (H)  mark values outside specified reference range.  ------------------------------------------------------------------- Prepared and Electronically Authenticated by  Leim Moose, M.D. 2019-06-18T16:24:45    CT SCANS  CT CORONARY MORPH W/CTA COR W/SCORE 02/20/2018  Addendum 02/20/2018  6:32 PM ADDENDUM REPORT: 02/20/2018 18:29  CLINICAL DATA:  89 year old female with severe aortic stenosis being evaluated for a TAVR procedure.  EXAM: Cardiac TAVR CT  TECHNIQUE: The patient was scanned on a [employer]. A 120 kV retrospective scan was triggered in the descending thoracic aorta at 111 HU's. Gantry rotation speed was 250 msecs and collimation was .6 mm. No beta blockade or nitro were given. The 3D data set was reconstructed in 5% intervals of the R-R cycle. Systolic and diastolic phases were analyzed on a dedicated work station using MPR, MIP and VRT modes. The patient received 80 cc of contrast.  FINDINGS: Aortic Valve: Trileaflet, severely thickened, moderately calcified aortic valve with mild calcifications extending asymmetrically into the LVOT under the right and left coronary cusps.  Aorta: Normal size  with moderate diffuse calcifications and atherosclerosis, no dissection.  Sinotubular Junction: 24 x 24 mm  Ascending Thoracic Aorta: 29 x 29 mm  Aortic Arch: 23 x 23 mm  Descending Thoracic Aorta: 19 x 19 mm  Sinus of Valsalva Measurements:  Non-coronary: 31 mm  Right -coronary: 30 mm  Left -coronary: 29 mm  Sinus of Valsalva Height:  Right -coronary: 22 mm  Left -coronary: 21 mm  Coronary Artery Height above Annulus:  Left Main: 17 mm  Right Coronary: 18 mm  Virtual Basal Annulus Measurements:  Maximum/Minimum Diameter: 22.7 x 18.5 mm  Mean Diameter: 20.4 mm  Perimeter: 65.1 mm  Area: 326 mm2  Optimum Fluoroscopic Angle for Delivery: LAO 24 CAU 20  IMPRESSION: 1. Trileaflet, severely thickened, moderately calcified aortic valve with mild calcifications extending asymmetrically into the LVOT under the right and left coronary cusps. Annular measurements suitable for delivery of a 20 mm Edwards-SAPIEN 3 valve or a 26 mm Medtronic Evolut R valve.  2. Sufficient coronary to annulus distance.  3. Optimum Fluoroscopic Angle for Delivery: LAO 24 CAU 20  4. No thrombus in the left atrial appendage.   Electronically Signed By: Leim Moose On: 02/20/2018 18:29  Narrative EXAM: OVER-READ INTERPRETATION  CT CHEST  The following report is an over-read performed by radiologist Dr. Toribio Aye of [facility] on 02/20/2018. This over-read does not include interpretation of cardiac or coronary anatomy or pathology. The coronary calcium score/coronary CTA interpretation by the cardiologist is attached.  COMPARISON:  None.  FINDINGS: Extracardiac findings will be described under separate dictation for contemporaneously obtained CTA chest, abdomen and pelvis.  IMPRESSION: Please see separate dictation for contemporaneously obtained CTA chest, abdomen and pelvis dated 02/20/2018 for full description of relevant extracardiac  findings.  Electronically Signed: By: Toribio Aye M.D. On: 02/20/2018 15:14     ______________________________________________________________________________________________          Recent Labs: 04/06/2024: ALT 7; BUN 34; Creatinine 1.61; Potassium 4.5; Sodium 142 06/08/2024: Hemoglobin 12.0; Platelet Count 274  Recent Lipid Panel    Component Value Date/Time   CHOL 217 (H) 04/10/2021 1357   TRIG 180 (H) 04/10/2021  1357   HDL 78 04/10/2021 1357   CHOLHDL 2.8 04/10/2021 1357   LDLCALC 108 (H) 04/10/2021 1357    Physical Exam:    VS:  There were no vitals taken for this visit.    Wt Readings from Last 3 Encounters:  05/11/24 103 lb (46.7 kg)  04/10/24 102 lb 3.2 oz (46.4 kg)  01/03/24 100 lb 6.4 oz (45.5 kg)     GEN: Frail elderly woman developed in no acute distress HEENT: Normal NECK: No JVD; No carotid bruits LYMPHATICS: No lymphadenopathy CARDIAC: She has a holosystolic murmur apex left sternal border as well as a murmur aortic regurgitation RRR, no murmurs, rubs, gallops RESPIRATORY:  Clear to auscultation without rales, wheezing or rhonchi  ABDOMEN: Soft, non-tender, non-distended MUSCULOSKELETAL:  No edema; No deformity  SKIN: Warm and dry NEUROLOGIC:  Alert and oriented x 3 PSYCHIATRIC:  Normal affect    Signed, Redell Leiter, MD  07/22/2024 4:16 PM    [facility]

## 2024-07-23 ENCOUNTER — Encounter: Payer: Self-pay | Admitting: Cardiology

## 2024-07-23 ENCOUNTER — Ambulatory Visit: Attending: Cardiology | Admitting: Cardiology

## 2024-07-23 VITALS — BP 114/44 | HR 60 | Ht 60.0 in | Wt 103.6 lb

## 2024-07-23 DIAGNOSIS — Z950 Presence of cardiac pacemaker: Secondary | ICD-10-CM | POA: Insufficient documentation

## 2024-07-23 DIAGNOSIS — I130 Hypertensive heart and chronic kidney disease with heart failure and stage 1 through stage 4 chronic kidney disease, or unspecified chronic kidney disease: Secondary | ICD-10-CM | POA: Diagnosis present

## 2024-07-23 DIAGNOSIS — I442 Atrioventricular block, complete: Secondary | ICD-10-CM | POA: Insufficient documentation

## 2024-07-23 DIAGNOSIS — D631 Anemia in chronic kidney disease: Secondary | ICD-10-CM | POA: Diagnosis present

## 2024-07-23 DIAGNOSIS — N189 Chronic kidney disease, unspecified: Secondary | ICD-10-CM | POA: Insufficient documentation

## 2024-07-23 DIAGNOSIS — Z952 Presence of prosthetic heart valve: Secondary | ICD-10-CM | POA: Diagnosis present

## 2024-07-23 DIAGNOSIS — N183 Chronic kidney disease, stage 3 unspecified: Secondary | ICD-10-CM | POA: Diagnosis present

## 2024-07-23 NOTE — Patient Instructions (Signed)

## 2024-08-10 ENCOUNTER — Inpatient Hospital Stay: Attending: Oncology | Admitting: Hematology and Oncology

## 2024-08-10 ENCOUNTER — Inpatient Hospital Stay

## 2024-08-10 ENCOUNTER — Other Ambulatory Visit: Payer: Self-pay

## 2024-08-10 VITALS — BP 108/30 | HR 60 | Temp 97.7°F | Resp 18 | Ht 60.0 in | Wt 101.7 lb

## 2024-08-10 DIAGNOSIS — N1832 Chronic kidney disease, stage 3b: Secondary | ICD-10-CM | POA: Diagnosis present

## 2024-08-10 DIAGNOSIS — D631 Anemia in chronic kidney disease: Secondary | ICD-10-CM | POA: Diagnosis present

## 2024-08-10 LAB — CMP (CANCER CENTER ONLY)
ALT: 12 U/L (ref 0–44)
AST: 36 U/L (ref 15–41)
Albumin: 4.4 g/dL (ref 3.5–5.0)
Alkaline Phosphatase: 72 U/L (ref 38–126)
Anion gap: 13 (ref 5–15)
BUN: 26 mg/dL — ABNORMAL HIGH (ref 8–23)
CO2: 28 mmol/L (ref 22–32)
Calcium: 10.1 mg/dL (ref 8.9–10.3)
Chloride: 103 mmol/L (ref 98–111)
Creatinine: 1.55 mg/dL — ABNORMAL HIGH (ref 0.44–1.00)
GFR, Estimated: 30 mL/min — ABNORMAL LOW (ref >60)
Glucose, Bld: 99 mg/dL (ref 70–99)
Potassium: 4.4 mmol/L (ref 3.5–5.1)
Sodium: 144 mmol/L (ref 135–145)
Total Bilirubin: 0.4 mg/dL (ref 0.0–1.2)
Total Protein: 8 g/dL (ref 6.5–8.1)

## 2024-08-10 LAB — CBC WITH DIFFERENTIAL (CANCER CENTER ONLY)
Abs Immature Granulocytes: 0.04 K/uL (ref 0.00–0.07)
Basophils Absolute: 0 K/uL (ref 0.0–0.1)
Basophils Relative: 1 %
Eosinophils Absolute: 0.1 K/uL (ref 0.0–0.5)
Eosinophils Relative: 1 %
HCT: 40.6 % (ref 36.0–46.0)
Hemoglobin: 12.9 g/dL (ref 12.0–15.0)
Immature Granulocytes: 1 %
Lymphocytes Relative: 18 %
Lymphs Abs: 1.1 K/uL (ref 0.7–4.0)
MCH: 30.2 pg (ref 26.0–34.0)
MCHC: 31.8 g/dL (ref 30.0–36.0)
MCV: 95.1 fL (ref 80.0–100.0)
Monocytes Absolute: 0.3 K/uL (ref 0.1–1.0)
Monocytes Relative: 5 %
Neutro Abs: 4.7 K/uL (ref 1.7–7.7)
Neutrophils Relative %: 74 %
Platelet Count: 233 K/uL (ref 150–400)
RBC: 4.27 MIL/uL (ref 3.87–5.11)
RDW: 15 % (ref 11.5–15.5)
WBC Count: 6.2 K/uL (ref 4.0–10.5)
nRBC: 0 % (ref 0.0–0.2)

## 2024-08-10 LAB — IRON AND TIBC
Iron: 106 ug/dL (ref 28–170)
Saturation Ratios: 29 % (ref 10.4–31.8)
TIBC: 367 ug/dL (ref 250–450)
UIBC: 261 ug/dL

## 2024-08-10 LAB — FERRITIN: Ferritin: 451 ng/mL — ABNORMAL HIGH (ref 11–307)

## 2024-08-10 NOTE — Progress Notes (Unsigned)
 [facility]  [address] [place],  [place]  [address] [phone]  Clinic Day:  08/10/2024  Referring physician: Conley Dene BROCKS, DO   HISTORY OF PRESENT ILLNESS:  The patient is a 89 y.o. female with anemia due to chronic kidney disease.  She is here today for repeat clinical assessment.  She was admitted to [facility] with acute GI bleed.  She was managed conservatively.  Her hemoglobin had dropped to 6.8, so she was transfused 2 units of packed red blood cells.  Her hemoglobin was 12.4 on discharge. She states she remains weak since hospitalization in October. She denies continued blood loss.  She is no longer taking an oral iron supplement.  She is in a wheelchair today. She is accompanied by one of her in home caregivers.  VITALS:   Blood pressure (!) 108/30, pulse 60, temperature 97.7 F (36.5 C), temperature source Axillary, resp. rate 18, height 5' (1.524 m), weight 101 lb 11.2 oz (46.1 kg), SpO2 95%. Wt Readings from Last 3 Encounters:  08/10/24 101 lb 11.2 oz (46.1 kg)  07/23/24 103 lb 9.6 oz (47 kg)  05/11/24 103 lb (46.7 kg)   Body mass index is 19.86 kg/m.  Performance status (ECOG): 2 - Symptomatic, <50% confined to bed  PHYSICAL EXAM:   Physical Exam Vitals and nursing note reviewed.  Constitutional:      General: She is not in acute distress.    Appearance: Normal appearance.  HENT:     Head: Normocephalic and atraumatic.     Mouth/Throat:     Mouth: Mucous membranes are moist.     Pharynx: Oropharynx is clear. No oropharyngeal exudate or posterior oropharyngeal erythema.  Eyes:     General: No scleral icterus.    Extraocular Movements: Extraocular movements intact.     Conjunctiva/sclera: Conjunctivae normal.     Pupils: Pupils are equal, round, and reactive to light.  Cardiovascular:     Rate and Rhythm: Normal rate and regular rhythm.     Heart sounds: Normal heart sounds. No murmur heard.    No friction rub. No  gallop.  Pulmonary:     Effort: Pulmonary effort is normal.     Breath sounds: Normal breath sounds. No wheezing, rhonchi or rales.  Abdominal:     General: There is no distension.     Palpations: Abdomen is soft. There is no mass.     Tenderness: There is no abdominal tenderness.  Musculoskeletal:        General: Normal range of motion.     Cervical back: Normal range of motion and neck supple. No tenderness.     Right lower leg: No edema.     Left lower leg: No edema.  Lymphadenopathy:     Cervical: No cervical adenopathy.     Upper Body:     Right upper body: No supraclavicular or axillary adenopathy.     Left upper body: No supraclavicular or axillary adenopathy.  Skin:    General: Skin is warm and dry.     Coloration: Skin is not jaundiced.     Findings: No rash.  Neurological:     Mental Status: She is alert and oriented to person, place, and time.     Cranial Nerves: No cranial nerve deficit.  Psychiatric:        Mood and Affect: Mood normal.        Behavior: Behavior normal.        Thought Content: Thought  content normal.      LABS:      Latest Ref Rng & Units 08/10/2024    1:32 PM 06/08/2024    2:12 PM 04/06/2024    1:31 PM  CBC  WBC 4.0 - 10.5 K/uL 6.2  9.3  6.6   Hemoglobin 12.0 - 15.0 g/dL 87.0  87.9  88.2   Hematocrit 36.0 - 46.0 % 40.6  37.9  37.2   Platelets 150 - 400 K/uL 233  274  190       Latest Ref Rng & Units 08/10/2024    1:32 PM 04/06/2024    1:31 PM 12/02/2023    1:11 PM  CMP  Glucose 70 - 99 mg/dL 99  93  80   BUN 8 - 23 mg/dL 26  34  35   Creatinine 0.44 - 1.00 mg/dL 8.44  8.38  8.61   Sodium 135 - 145 mmol/L 144  142  141   Potassium 3.5 - 5.1 mmol/L 4.4  4.5  4.5   Chloride 98 - 111 mmol/L 103  103  99   CO2 22 - 32 mmol/L 28  26  31    Calcium 8.9 - 10.3 mg/dL 89.8  9.7  89.2   Total Protein 6.5 - 8.1 g/dL 8.0  7.4  8.0   Total Bilirubin 0.0 - 1.2 mg/dL 0.4  0.4  0.3   Alkaline Phos 38 - 126 U/L 72  65  94   AST 15 - 41 U/L 36  28  29    ALT 0 - 44 U/L 12  7  12      Lab Results  Component Value Date   TOTALPROTELP 6.9 11/08/2022   ALBUMINELP 3.7 11/08/2022   A1GS 0.3 11/08/2022   A2GS 0.6 11/08/2022   BETS 0.9 11/08/2022   GAMS 1.4 11/08/2022   MSPIKE Not Observed 11/08/2022   SPEI Comment 11/08/2022   Lab Results  Component Value Date   TIBC 375 04/06/2024   TIBC 370 12/02/2023   TIBC 363 09/02/2023   FERRITIN 101 04/06/2024   FERRITIN 224 12/02/2023   FERRITIN 147 09/02/2023   IRONPCTSAT 24 04/06/2024   IRONPCTSAT 18 12/02/2023   IRONPCTSAT 30 09/02/2023       Component Value Date/Time   TOTALPROTELP 6.9 11/08/2022 1452   ALBUMINELP 3.7 11/08/2022 1452   A1GS 0.3 11/08/2022 1452   A2GS 0.6 11/08/2022 1452   BETS 0.9 11/08/2022 1452   GAMS 1.4 11/08/2022 1452   MSPIKE Not Observed 11/08/2022 1452   SPEI Comment 11/08/2022 1452    Review Flowsheet  More data exists      Latest Ref Rng & Units 09/02/2023 12/02/2023 04/06/2024  Oncology Labs  Ferritin 11 - 307 ng/mL 147  224  101   %SAT 10.4 - 31.8 % 30  18  24       STUDIES:   No results found.    ASSESSMENT & PLAN:   Assessment/Plan:  89 y.o. female with anemia due to chronic kidney disease. She has not required erythropoietin since January.  She had a recent GI bleed but her hemoglobin is 12.9 today.  Iron studies are pending.  We will to continue to check a CBC every 2 months to ensure there is no drop in her hemoglobin.  We will see her back in 4 months for repeat clinical assessment.  The patient and her caregiver understand all the plans discussed today and are in agreement with them.  They know to contact our office  if she develops concerns prior to her next appointment.     Andrez DELENA Foy, PA-C   Physician Assistant [facility] [phone]

## 2024-08-11 ENCOUNTER — Telehealth: Payer: Self-pay

## 2024-08-11 ENCOUNTER — Encounter: Payer: Self-pay | Admitting: Hematology and Oncology

## 2024-08-11 ENCOUNTER — Encounter: Payer: Self-pay | Admitting: Oncology

## 2024-08-11 NOTE — Telephone Encounter (Signed)
 Daughter notified and voiced understanding

## 2024-08-11 NOTE — Telephone Encounter (Signed)
-----   Message from Andrez DELENA Foy sent at 08/11/2024  7:51 AM EST ----- Please let her daughter, Andres, know her iron levels are good. Thanks

## 2024-08-31 ENCOUNTER — Telehealth: Payer: Self-pay | Admitting: Cardiology

## 2024-08-31 NOTE — Telephone Encounter (Signed)
 Pt daughter called in stating pt went to [facility] ER a few weeks ago and was told to f/u. They would rather not wait until 10/08/24. Please advise what she can do.

## 2024-09-01 NOTE — Telephone Encounter (Signed)
 Scheduled for 09/18/24. Pt daughter is aware.

## 2024-09-16 ENCOUNTER — Other Ambulatory Visit: Payer: Self-pay

## 2024-09-16 ENCOUNTER — Ambulatory Visit: Payer: Medicare Other

## 2024-09-16 DIAGNOSIS — I442 Atrioventricular block, complete: Secondary | ICD-10-CM

## 2024-09-17 ENCOUNTER — Ambulatory Visit: Payer: Self-pay | Admitting: Cardiology

## 2024-09-17 LAB — CUP PACEART REMOTE DEVICE CHECK
Battery Remaining Longevity: 58 mo
Battery Remaining Percentage: 48 %
Battery Voltage: 2.98 V
Brady Statistic AP VP Percent: 1 %
Brady Statistic AP VS Percent: 21 %
Brady Statistic AS VP Percent: 1 %
Brady Statistic AS VS Percent: 78 %
Brady Statistic RA Percent Paced: 21 %
Brady Statistic RV Percent Paced: 1 %
Date Time Interrogation Session: 20251217020014
Implantable Lead Connection Status: 753985
Implantable Lead Connection Status: 753985
Implantable Lead Implant Date: 20190620
Implantable Lead Implant Date: 20190620
Implantable Lead Location: 753859
Implantable Lead Location: 753860
Implantable Pulse Generator Implant Date: 20190620
Lead Channel Impedance Value: 330 Ohm
Lead Channel Impedance Value: 400 Ohm
Lead Channel Pacing Threshold Amplitude: 0.5 V
Lead Channel Pacing Threshold Amplitude: 0.875 V
Lead Channel Pacing Threshold Pulse Width: 0.5 ms
Lead Channel Pacing Threshold Pulse Width: 0.5 ms
Lead Channel Sensing Intrinsic Amplitude: 10.6 mV
Lead Channel Sensing Intrinsic Amplitude: 3.5 mV
Lead Channel Setting Pacing Amplitude: 1.125
Lead Channel Setting Pacing Amplitude: 2 V
Lead Channel Setting Pacing Pulse Width: 0.5 ms
Lead Channel Setting Sensing Sensitivity: 2 mV
Pulse Gen Model: 2272
Pulse Gen Serial Number: 9035930

## 2024-09-17 NOTE — Progress Notes (Signed)
 Remote PPM Transmission

## 2024-09-17 NOTE — Progress Notes (Unsigned)
 Cardiology Office Note:    Date:  09/17/2024   ID:  Kaitlin Villarreal, DOB 03-20-1926, MRN [id number]  PCP:  Conley Dene BROCKS., MD  Cardiologist:  Redell Leiter, MD    Referring MD: Conley Dene BROCKS., MD    ASSESSMENT:    No diagnosis found. PLAN:    In order of problems listed above:  ***   Next appointment: ***   Medication Adjustments/Labs and Tests Ordered: Current medicines are reviewed at length with the patient today.  Concerns regarding medicines are outlined above.  No orders of the defined types were placed in this encounter.  No orders of the defined types were placed in this encounter.    History of Present Illness:    Kaitlin Villarreal is a 89 y.o. female with a hx of very complex history including symptomatic aortic stenosis with TAVR June 2019 permanent pacemaker after TAVR for heart block hypertensive heart disease with heart failure and stage III CKD anemia chronic kidney disease gout and gouty arthritis last seen by me 07/22/2024. Compliance with diet, lifestyle and medications: *** Past Medical History:  Diagnosis Date   ABLA (acute blood loss anemia) 08/21/2023   Abnormal CT scan, gallbladder 09/08/2020   Acute on chronic diastolic heart failure (HCC) 03/18/2018   Anemia in chronic kidney disease 11/08/2022   Aortic stenosis 10/14/2014   Formatting of this note might be different from the original.  moderate by echo 11/2015   Arthritis    Bilateral renal masses 07/26/2017   Bone spur of toe of left foot    Chronic diastolic (congestive) heart failure (HCC)    Chronic diastolic heart failure (HCC) 03/18/2018   Chronic renal insufficiency 04/10/2021   CKD (chronic kidney disease) stage 3, GFR 30-59 ml/min (HCC) 07/26/2017   Diverticulosis 07/04/2024   Diverticulosis of colon with hemorrhage 08/22/2023   Dizziness 04/10/2021   Elevated brain natriuretic peptide (BNP) level 04/10/2021   Essential hypertension    Foot pain, bilateral 10/18/2021   GERD  (gastroesophageal reflux disease)    GI bleed 08/20/2023   Heart murmur 07/04/2024   Hematochezia 08/21/2023   HLD (hyperlipidemia)    Hyperlipidemia 11/25/2016   Hypertension 04/10/2021   Hypertensive encephalopathy 04/10/2021   Moderate dehydration 07/04/2024   Non-pressure chronic ulcer of skin of other sites with unspecified severity (HCC) 09/17/2023   Open wound 04/10/2021   Osteoarthritis 04/10/2021   Pacemaker 09/02/2018   Pancreatic lesion 04/17/2018   Pancreatic mass    a. 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 03/2018: s/p TAVR by [facility] and Dr. Dusty   Stage 3 chronic kidney disease (HCC)    Stage 3 chronic kidney disease due to benign hypertension (HCC) 04/10/2021   TIA (transient ischemic attack)     a. 1992   VSD (ventricular septal defect)     Current Medications: Active Medications[1]    EKGs/Labs/Other Studies Reviewed:    The following studies were reviewed today:  Cardiac Studies & Procedures   ______________________________________________________________________________________________ CARDIAC CATHETERIZATION  CARDIAC CATHETERIZATION 03/20/2018  Conclusion 1.  No significant left to right shunting based on absence of a significant O2 step up from the SVC to the pulmonary artery 2.  Elevated right and left heart intracardiac  filling pressures consistent with acute on chronic diastolic heart failure  Plan: Serial echo follow-up of the patient's small perimembranous VSD, IV diuresis and medical therapy for treatment of congestive heart failure, permanent pacemaker placement per Dr. Inocencio to follow   CARDIAC CATHETERIZATION  CARDIAC CATHETERIZATION 02/06/2018  Conclusion  Ost 1st Diag lesion is 50%  stenosed.  Prox Cx lesion is 40% stenosed.  There is severe aortic valve stenosis.  1.  Calcified coronary arteries without significant stenosis.  Mild proximal left circumflex stenosis, minimal irregularity of the RCA, minimal irregularity of the LAD and moderate stenosis of an ostial diagonal 2.  Severely calcified aortic valve with known severe aortic stenosis by noninvasive assessment 3.  Normal right heart hemodynamics with preserved cardiac output and normal intracardiac filling pressures  Recommendations: Continue multidisciplinary evaluation for TAVR.  Medical therapy for mild nonobstructive CAD.  Findings Coronary Findings Diagnostic  Dominance: Right  Left Main There is mild diffuse disease throughout the vessel. The vessel is moderately calcified. The left main is calcified with no significant stenosis  Left Anterior Descending The vessel exhibits minimal luminal irregularities. The LAD is patent throughout.  The distal vessel has multiple angulated segments but there are no stenoses.  The origin of the first diagonal branch has 50% stenosis.  First Diagonal Branch Ost 1st Diag lesion is 50% stenosed.  Left Circumflex There is mild diffuse disease throughout the vessel. Prox Cx lesion is 40% stenosed. The lesion is moderately calcified.  First Obtuse Marginal Branch Vessel is moderate in size. There is mild disease in the vessel.  Second Obtuse Marginal Branch There is mild disease in the vessel.  Right Coronary Artery Vessel is moderate in size. There is mild diffuse disease throughout the vessel. The origin of the RCA is calcified around the right cusp.  The vessel has moderate diffuse calcification.  The vessel is widely patent with minor diffuse irregularities noted but no significant stenosis.  Intervention  No interventions have been documented.   STRESS TESTS  ECHOCARDIOGRAM STRESS TEST 01/08/2018  Narrative *[facility]* [address] [place], [place] [address] [phone]  ------------------------------------------------------------------- Stress Echocardiography  Patient:    Kaitlin Villarreal, Kaitlin Villarreal MR #:       [id number] Study Date: 01/08/2018 Gender:     F Age:        56 Height:     152.4 cm Weight:     54.5 kg BSA:        1.53 m^2 Pt. Status: Room:  ATTENDING    Default, Provider [phone] [facility]     Redell Leiter, MD REFERRING    Redell Leiter, MD REFERRING    Dottie Ponce Norleen PHEBE PERFORMING   [facility] [place] SONOGRAPHER  Fairy Canton, RDCS  cc:  -------------------------------------------------------------------  ------------------------------------------------------------------- Indications:      Aortic stenosis 424.1.  ------------------------------------------------------------------- History:   PMH:   Murmur.  Aortic valve disease.  Risk factors: Hypertension.  ------------------------------------------------------------------- Study Conclusions  - Aortic valve: Valve area (VTI): 0.7 cm^2. Valve area (Vmax): 0.76 cm^2. Valve area (Vmean): 0.76 cm^2. - Stress: There was a normal resting blood pressure with a hypotensive response to stress. Functional capacity was decreased (greater than 40%). - Stress ECG conclusions: There were no stress arrhythmias or conduction abnormalities. The stress ECG was non-diagnostic.  ------------------------------------------------------------------- Labs, prior tests, procedures, and surgery: ECG.     Abnormal.  ------------------------------------------------------------------- Study data:   Study status:  Routine.  Consent:  The risks, benefits, and alternatives to  the procedure were explained to the patient and informed consent was obtained.  Procedure:  Initial setup. The patient was brought to the laboratory. A baseline ECG was recorded. Surface ECG leads and automatic cuff blood pressure measurements were monitored. Treadmill exercise testing  was performed using the modified Bruce protocol. The patient exercised for 4.5 min, to a maximal work rate of 3.4 mets. Exercise was terminated due to fatigue. Transthoracic stress echocardiography for assessment of valvular function. Image quality was adequate. Images were captured at baseline and peak exercise.  Study completion:  The patient tolerated the procedure well. There were no complications.          Modified Bruce protocol. Stress echocardiography.  Birthdate:  Patient birthdate: Dec 05, 1925.  Age: Patient is 89 yr old.  Sex:  Gender: female.    BMI: 23.5 kg/m^2. Blood pressure:     181/72  Patient status:  Outpatient.  Study date:  Study date: 01/08/2018. Study time: 11:35 AM.  -------------------------------------------------------------------  ------------------------------------------------------------------- Aortic valve:  Resting gradient .  Post stress: Peak stress velocity 376 cm/s. mean velocity 297cm/s. Mean gradient 39 mmHG.  Doppler:     VTI ratio of LVOT to aortic valve: 0.22. Valve area (VTI): 0.7 cm^2. Indexed valve area (VTI): 0.46 cm^2/m^2. Peak velocity ratio of LVOT to aortic valve: 0.24. Valve area (Vmax): 0.76 cm^2. Indexed valve area (Vmax): 0.5 cm^2/m^2. Mean velocity ratio of LVOT to aortic valve: 0.24. Valve area (Vmean): 0.76 cm^2. Indexed valve area (Vmean): 0.5 cm^2/m^2. Resting mean gradient (S): 44 mm Hg. Peak gradient (S): 75 mm Hg.  ------------------------------------------------------------------- Mitral valve:   Doppler:     Peak gradient (D): 9 mm Hg.  ------------------------------------------------------------------- Baseline ECG:  Normal.  Normal sinus rhythm.  Nonspecific ST changes.  ------------------------------------------------------------------- Stress protocol:  +---------------------+---+------------+--------+ !Stage                !HR !BP (mmHg)    !Symptoms! +---------------------+---+------------+--------+ !Baseline             !70 !181/72 (108)!None    ! +---------------------+---+------------+--------+ !Stage 0              !105!------------!--------! +---------------------+---+------------+--------+ !Stage 1/2            !116!------------!--------! +---------------------+---+------------+--------+ !Immediate post stress!113!------------!--------! +---------------------+---+------------+--------+ !Recovery; 1 min      !88 !------------!--------! +---------------------+---+------------+--------+ !Recovery; 2 min      !82 !------------!--------! +---------------------+---+------------+--------+  ------------------------------------------------------------------- Stress results:   Maximal heart rate during stress was 116 bpm (89% of maximal predicted heart rate). The maximal predicted heart rate was 131 bpm.The target heart rate was achieved. The heart rate response to stress was normal. There was a normal resting blood pressure with a hypotensive response to stress. The rate-pressure product for the peak heart rate and blood pressure was 87329 mm Hg/min.  The patient experienced no chest pain during stress. Functional capacity was decreased (greater than 40%).  ------------------------------------------------------------------- Stress ECG:  There were no stress arrhythmias or conduction abnormalities.  The stress ECG was non-diagnostic.  ------------------------------------------------------------------- Measurements  Left ventricle                            Value          Reference LV ID, ED, PLAX chordal           (L)     33.4  mm       43 - 52 LV ID, ES, PLAX chordal           (  L)     17.9  mm       23 - 38 LV fx shortening, PLAX chordal            46    %        >=29 LV PW thickness, ED                       10.2  mm       --------- IVS/LV PW ratio, ED               (H)     1.49           <=1.3 Stroke volume, 2D                          84    ml       --------- Stroke volume/bsa, 2D                     55    ml/m^2   ---------  Ventricular septum                        Value          Reference IVS thickness, ED                         15.2  mm       ---------  LVOT                                      Value          Reference LVOT ID, S                                20    mm       --------- LVOT area                                 3.14  cm^2     --------- LVOT peak velocity, S                     105   cm/s     --------- LVOT mean velocity, S                     75.8  cm/s     --------- LVOT VTI, S                               26.8  cm       ---------  Aortic valve                              Value          Reference Aortic valve peak velocity, S             434   cm/s     --------- Aortic valve mean velocity, S             312   cm/s     ---------  Aortic valve VTI, S                       121   cm       --------- Aortic mean gradient, S                   40    mm Hg    --------- Aortic peak gradient, S                   75    mm Hg    --------- VTI ratio, LVOT/AV                        0.22           --------- Aortic valve area, VTI                    0.7   cm^2     --------- Aortic valve area/bsa, VTI                0.46  cm^2/m^2 --------- Velocity ratio, peak, LVOT/AV             0.24           --------- Aortic valve area, peak velocity          0.76  cm^2     --------- Aortic valve area/bsa, peak               0.5   cm^2/m^2 --------- velocity Velocity ratio, mean, LVOT/AV             0.24           --------- Aortic valve area, mean velocity          0.76  cm^2     --------- Aortic valve area/bsa, mean               0.5   cm^2/m^2 --------- velocity  Aorta                                     Value          Reference Aortic root ID, ED                        27    mm       ---------  Left atrium                               Value          Reference LA ID, A-P, ES                             36    mm       --------- LA ID/bsa, A-P                    (H)     2.36  cm/m^2   <=2.2  Mitral valve                              Value          Reference Mitral E-wave peak velocity  147   cm/s     --------- Mitral A-wave peak velocity               118   cm/s     --------- Mitral deceleration time                  194   ms       150 - 230 Mitral peak gradient, D                   9     mm Hg    --------- Mitral E/A ratio, peak                    1.2            ---------  Legend: (L)  and  (H)  mark values outside specified reference range.  ------------------------------------------------------------------- Prepared and Electronically Authenticated by  Redell Leiter, MD 2019-04-10T13:52:39   ECHOCARDIOGRAM  ECHOCARDIOGRAM COMPLETE 08/12/2019  Narrative ECHOCARDIOGRAM REPORT    Patient Name:   Kaitlin Villarreal   Date of Exam: 08/12/2019 Medical Rec #:  [id number]     Height:       60.0 in Accession #:    [id number]    Weight:       116.0 lb Date of Birth:  [birth year]     BSA:          1.48 m Patient Age:    89 years      BP:           130/50 mmHg Patient Gender: F             HR:           73 bpm. Exam Location:  [address]  Procedure: 2D Echo, Cardiac Doppler and Color Doppler  Indications:     Z95.2 Post TAVR evaluation  History:         Patient has prior history of Echocardiogram examinations, most recent 02/04/2019. TAVR. Hypertensive heart disease. 3rd degree AV block. VSD. Chronic kidney disease.  Sonographer:     Jon Hacker RCS Referring Phys:  [id number] LAMARR SAUNDERS THOMPSON Diagnosing Phys: Aleene Passe MD  IMPRESSIONS   1. Left ventricular ejection fraction, by visual estimation, is 60 to 65%. The left ventricle has normal function. There is mildly increased left ventricular hypertrophy. 2. There is a small VSD with left to right shunting present. 3. Global right ventricle has normal systolic function.The right ventricular size is normal. No  increase in right ventricular wall thickness. 4. Left atrial size was normal. 5. Right atrial size was normal. 6. The mitral valve is normal in structure. Mild mitral valve regurgitation. Mild mitral stenosis. 7. The tricuspid valve is grossly normal. Tricuspid valve regurgitation mild-moderate. 8. Aortic valve regurgitation is not visualized. 9. The pulmonic valve was grossly normal. Pulmonic valve regurgitation is not visualized. 10. Severely elevated pulmonary artery systolic pressure. 11. The atrial septum is grossly normal.  FINDINGS Left Ventricle: Left ventricular ejection fraction, by visual estimation, is 60 to 65%. The left ventricle has normal function. There is mildly increased left ventricular hypertrophy. There is a small VSD with left to right shunting present.  Right Ventricle: The right ventricular size is normal. No increase in right ventricular wall thickness. Global RV systolic function is has normal systolic function. The tricuspid regurgitant velocity is 4.69 m/s, and with an assumed right atrial pressure of 10 mmHg, the estimated right ventricular systolic pressure is  severely elevated at 98.0 mmHg.  Left Atrium: Left atrial size was normal in size.  Right Atrium: Right atrial size was normal in size  Pericardium: There is no evidence of pericardial effusion.  Mitral Valve: The mitral valve is normal in structure. Mild mitral valve stenosis by observation. MV peak gradient, 14.0 mmHg. Mild mitral valve regurgitation.  Tricuspid Valve: The tricuspid valve is grossly normal. Tricuspid valve regurgitation mild-moderate.  Aortic Valve: The aortic valve has been repaired/replaced. Aortic valve regurgitation is not visualized. Aortic valve mean gradient measures 6.5 mmHg. Aortic valve peak gradient measures 13.4 mmHg. Aortic valve area, by VTI measures 2.36 cm. Homograft aortic valve valve is present in the aortic position.  Pulmonic Valve: The pulmonic valve was  grossly normal. Pulmonic valve regurgitation is not visualized.  Aorta: The aortic root and ascending aorta are structurally normal, with no evidence of dilitation.  IAS/Shunts: The atrial septum is grossly normal.    LEFT VENTRICLE PLAX 2D LVIDd:         3.50 cm  Diastology LVIDs:         1.90 cm  LV e' lateral:   8.49 cm/s LV PW:         1.20 cm  LV E/e' lateral: 18.7 LV IVS:        1.30 cm  LV e' medial:    5.87 cm/s LVOT diam:     2.00 cm  LV E/e' medial:  27.1 LV SV:         40 ml LV SV Index:   26.48 LVOT Area:     3.14 cm   RIGHT VENTRICLE RV Basal diam:  2.20 cm RV S prime:     12.40 cm/s TAPSE (M-mode): 1.9 cm RVSP:           91.0 mmHg  LEFT ATRIUM             Index       RIGHT ATRIUM           Index LA diam:        3.30 cm 2.23 cm/m  RA Pressure: 3.00 mmHg LA Vol (A2C):   50.4 ml 34.03 ml/m RA Area:     11.60 cm LA Vol (A4C):   28.9 ml 19.52 ml/m RA Volume:   23.80 ml  16.07 ml/m LA Biplane Vol: 38.7 ml 26.13 ml/m AORTIC VALVE AV Area (Vmax):    1.92 cm AV Area (Vmean):   1.97 cm AV Area (VTI):     2.36 cm AV Vmax:           183.00 cm/s AV Vmean:          119.500 cm/s AV VTI:            0.466 m AV Peak Grad:      13.4 mmHg AV Mean Grad:      6.5 mmHg LVOT Vmax:         112.00 cm/s LVOT Vmean:        74.900 cm/s LVOT VTI:          0.350 m LVOT/AV VTI ratio: 0.75  MITRAL VALVE                         TRICUSPID VALVE MV Area (PHT): 2.87 cm              TR Peak grad:   88.0 mmHg MV Peak grad:  14.0 mmHg  TR Vmax:        469.00 cm/s MV Mean grad:  6.0 mmHg              Estimated RAP:  3.00 mmHg MV Vmax:       1.87 m/s              RVSP:           91.0 mmHg MV Vmean:      115.0 cm/s MV VTI:        0.54 m                SHUNTS MV PHT:        76.56 msec            Systemic VTI:  0.35 m MV Decel Time: 264 msec              Systemic Diam: 2.00 cm MV E velocity: 159.00 cm/s 103 cm/s MV A velocity: 142.00 cm/s 70.3 cm/s MV E/A ratio:  1.12         1.5   Aleene Passe MD Electronically signed by Aleene Passe MD Signature Date/Time: 08/12/2019/5:28:06 PM    Final (Updated)   TEE  ECHO TEE 03/18/2018  Narrative *[facility]* *[facility]* [address] [place], [place] [address] [phone]  ------------------------------------------------------------------- Transesophageal Echocardiography  Patient:    Kaitlin Villarreal, Kaitlin Villarreal MR #:       [id number] Study Date: 03/18/2018 Gender:     F Age:        47 Height:     152.4 cm Weight:     54.4 kg BSA:        1.53 m^2 Pt. Status: Room:       2H24C  ADMITTING    Sudie Laine, M.D. ATTENDING    Ozell Fell, MD ORDERING     Ozell Fell, MD REFERRING    Ozell Fell, MD PERFORMING   Leim Moose, M.D. SONOGRAPHER  Ellouise Mose  cc:  ------------------------------------------------------------------- LV EF: 65% -   70%  ------------------------------------------------------------------- Indications:      Aortic stenosis 424.1.  ------------------------------------------------------------------- History:   Risk factors:  Hypertension. Dyslipidemia.  ------------------------------------------------------------------- Study Conclusions  - Left ventricle: Wall thickness was increased in a pattern of moderate LVH. Systolic function was vigorous. The estimated ejection fraction was in the range of 65% to 70%. Wall motion was normal; there were no regional wall motion abnormalities. - Aortic valve: There was severe regurgitation. Valve area (VTI): 0.77 cm^2. Valve area (Vmax): 0.8 cm^2. Valve area (Vmean): 2.37 cm^2. - Mitral valve: There was mild regurgitation. - Left atrium: No evidence of thrombus in the atrial cavity or appendage. No evidence of thrombus in the atrial cavity or appendage. - Right atrium: No evidence of thrombus in the atrial cavity or appendage.  Impressions:  - This was a periprocedural echocardiogram during a TAVR  procedure. TTE was switched to TEE for concern of a VSD and a follow up TEE was done 5 hours later.  Native aortic valve was severely thickened and calcified with severely restricted leaflet openings. Peak/mean transaortic gradients were 68/41 mmHg consistent with severe aortic stenosis. A 23 mm Edwards-SAPIEN 3 valve was successfully deployed in the aortic position. Post-deployment gradients were 12/4 mmHg. No paravalvular leak. A small perimembranous VSD was noted with small left to right shunting. A TEE probe was inserted and confirmed the diagnosis. There is trivial pericardial effusion. Mild mitral and tricuspid regurgitation.  5 hours later a limited TTE  was performed. It showed unchanged small VSD, stable aortic valve with normal unchanged gradients. No paravalvular leak. Trivial pericardial effusion.  ------------------------------------------------------------------- Study data:   Study status:  Routine.  Consent:  The risks, benefits, and alternatives to the procedure were explained to the patient and informed consent was obtained.  Procedure:  Study started as a limited transthoracic, then TEE probe was inserted to evaluate for VSD. The patient reported no pain pre or post test. Initial setup. The patient was brought to the laboratory. Surface ECG leads were monitored. Sedation. Conscious sedation was administered by anesthesiology staff. Transesophageal echocardiography. Topical anesthesia was obtained using viscous lidocaine . An adult multiplane transesophageal probe was inserted by the anesthesiologistwithout difficulty. Image quality was adequate.  Study completion:  The patient tolerated the procedure well. There were no complications.          Diagnostic transesophageal echocardiography.  2D and color Doppler. Birthdate:  Patient birthdate: June 06, 1926.  Age:  Patient is 89 yr old.  Sex:  Gender: female.    BMI: 23.4 kg/m^2.  Blood pressure: 204/61  Patient  status:  Inpatient.  Study date:  Study date: 03/18/2018. Study time: 07:34 AM.  Location:  Operating room.  -------------------------------------------------------------------  ------------------------------------------------------------------- Left ventricle:   Wall thickness was increased in a pattern of moderate LVH.   Systolic function was vigorous. The estimated ejection fraction was in the range of 65% to 70%. Wall motion was normal; there were no regional wall motion abnormalities.  ------------------------------------------------------------------- Aortic valve:   Trileaflet; severely thickened, severely calcified leaflets. Cusp separation was normal.  Doppler:  There was severe regurgitation.    VTI ratio of LVOT to aortic valve: 1.2. Valve area (VTI): 0.77 cm^2. Indexed valve area (VTI): 0.5 cm^2/m^2. Peak velocity ratio of LVOT to aortic valve: 0.92. Valve area (Vmax): 0.8 cm^2. Indexed valve area (Vmax): 0.52 cm^2/m^2. Mean velocity ratio of LVOT to aortic valve: 0.93. Valve area (Vmean): 2.37 cm^2. Indexed valve area (Vmean): 1.55 cm^2/m^2.    Mean gradient (S): 3 mm Hg. Peak gradient (S): 6 mm Hg.  ------------------------------------------------------------------- Aorta:  There was no atheroma. There was no evidence for dissection. Aortic root: The aortic root was not dilated. Ascending aorta: The ascending aorta was normal in size. Aortic arch: The aortic arch was normal in size. Descending aorta: The descending aorta was normal in size.  ------------------------------------------------------------------- Mitral valve:   Mildly thickened leaflets . Leaflet separation was normal.  Doppler:  There was mild regurgitation.  ------------------------------------------------------------------- Left atrium:  The atrium was normal in size.  No evidence of thrombus in the atrial cavity or appendage.  No evidence of thrombus in the atrial cavity or appendage. The appendage  was morphologically a left appendage, multilobulated, and of normal size. Emptying velocity was normal.  ------------------------------------------------------------------- Right ventricle:  The cavity size was normal. Wall thickness was normal. Systolic function was normal.  ------------------------------------------------------------------- Pulmonic valve:    Structurally normal valve.  ------------------------------------------------------------------- Tricuspid valve:   Structurally normal valve.   Leaflet separation was normal.  Doppler:  There was mild regurgitation.  ------------------------------------------------------------------- Pulmonary artery:   The main pulmonary artery was normal-sized.  ------------------------------------------------------------------- Right atrium:  The atrium was normal in size.  No evidence of thrombus in the atrial cavity or appendage. The appendage was morphologically a right appendage.  ------------------------------------------------------------------- Pericardium:  There was no pericardial effusion.  ------------------------------------------------------------------- Measurements  Left ventricle  Value          Reference LV ID, ED, PLAX chordal           (L)     28.7  mm       43 - 52 LV ID, ES, PLAX chordal           (L)     19.9  mm       23 - 38 LV fx shortening, PLAX chordal            31    %        >=29 LV PW thickness, ED                       14.3  mm       --------- IVS/LV PW ratio, ED                       0.93           <=1.3 Stroke volume, 2D                         76    ml       --------- Stroke volume/bsa, 2D                     50    ml/m^2   --------- LV end-diastolic volume, 1-p A4C          44    ml       --------- LV ejection fraction, 1-p A4C             66    %        --------- LV end-diastolic volume/bsa, 1-p          29    ml/m^2   --------- A4C LV end-diastolic volume, 2-p               48    ml       --------- LV end-systolic volume, 2-p               16    ml       --------- LV ejection fraction, 2-p                 68    %        --------- Stroke volume, 2-p                        32    ml       --------- LV end-diastolic volume/bsa, 2-p          31    ml/m^2   --------- LV end-systolic volume/bsa, 2-p           10    ml/m^2   --------- Stroke volume/bsa, 2-p                    21.1  ml/m^2   ---------  Ventricular septum                        Value          Reference IVS thickness, ED                         13.3  mm       ---------  LVOT                                      Value          Reference LVOT ID, S                                18    mm       --------- LVOT area                                 2.54  cm^2     --------- LVOT peak velocity, S                     109   cm/s     --------- LVOT mean velocity, S                     77.3  cm/s     --------- LVOT VTI, S                               30.1  cm       ---------  Aortic valve                              Value          Reference Aortic valve peak velocity, S             118   cm/s     --------- Aortic valve mean velocity, S             82.7  cm/s     --------- Aortic valve VTI, S                       25    cm       --------- Aortic mean gradient, S                   3     mm Hg    --------- Aortic peak gradient, S                   6     mm Hg    --------- VTI ratio, LVOT/AV                        1.2            --------- Aortic valve area, VTI                    0.77  cm^2     --------- Aortic valve area/bsa, VTI                0.5   cm^2/m^2 --------- Velocity ratio, peak, LVOT/AV             0.92           --------- Aortic valve area, peak velocity          0.8   cm^2     --------- Aortic valve area/bsa, peak  0.52  cm^2/m^2 --------- velocity Velocity ratio, mean, LVOT/AV             0.93           --------- Aortic valve area, mean velocity          2.37  cm^2      --------- Aortic valve area/bsa, mean               1.55  cm^2/m^2 --------- velocity  Aorta                                     Value          Reference Aortic root ID, ED                        27    mm       --------- Ascending aorta ID, A-P, S                29    mm       ---------  Left atrium                               Value          Reference LA ID, A-P, ES                            24    mm       --------- LA ID/bsa, A-P                            1.57  cm/m^2   <=2.2  Legend: (L)  and  (H)  mark values outside specified reference range.  ------------------------------------------------------------------- Prepared and Electronically Authenticated by  Leim Moose, M.D. 2019-06-18T16:24:45    CT SCANS  CT CORONARY MORPH W/CTA COR W/SCORE 02/20/2018  Addendum 02/20/2018  6:32 PM ADDENDUM REPORT: 02/20/2018 18:29  CLINICAL DATA:  89 year old female with severe aortic stenosis being evaluated for a TAVR procedure.  EXAM: Cardiac TAVR CT  TECHNIQUE: The patient was scanned on a [employer]. A 120 kV retrospective scan was triggered in the descending thoracic aorta at 111 HU's. Gantry rotation speed was 250 msecs and collimation was .6 mm. No beta blockade or nitro were given. The 3D data set was reconstructed in 5% intervals of the R-R cycle. Systolic and diastolic phases were analyzed on a dedicated work station using MPR, MIP and VRT modes. The patient received 80 cc of contrast.  FINDINGS: Aortic Valve: Trileaflet, severely thickened, moderately calcified aortic valve with mild calcifications extending asymmetrically into the LVOT under the right and left coronary cusps.  Aorta: Normal size with moderate diffuse calcifications and atherosclerosis, no dissection.  Sinotubular Junction: 24 x 24 mm  Ascending Thoracic Aorta: 29 x 29 mm  Aortic Arch: 23 x 23 mm  Descending Thoracic Aorta: 19 x 19 mm  Sinus of Valsalva  Measurements:  Non-coronary: 31 mm  Right -coronary: 30 mm  Left -coronary: 29 mm  Sinus of Valsalva Height:  Right -coronary: 22 mm  Left -coronary: 21 mm  Coronary Artery Height above Annulus:  Left Main: 17 mm  Right Coronary: 18 mm  Virtual Basal Annulus Measurements:  Maximum/Minimum Diameter:  22.7 x 18.5 mm  Mean Diameter: 20.4 mm  Perimeter: 65.1 mm  Area: 326 mm2  Optimum Fluoroscopic Angle for Delivery: LAO 24 CAU 20  IMPRESSION: 1. Trileaflet, severely thickened, moderately calcified aortic valve with mild calcifications extending asymmetrically into the LVOT under the right and left coronary cusps. Annular measurements suitable for delivery of a 20 mm Edwards-SAPIEN 3 valve or a 26 mm Medtronic Evolut R valve.  2. Sufficient coronary to annulus distance.  3. Optimum Fluoroscopic Angle for Delivery: LAO 24 CAU 20  4. No thrombus in the left atrial appendage.   Electronically Signed By: Leim Moose On: 02/20/2018 18:29  Narrative EXAM: OVER-READ INTERPRETATION  CT CHEST  The following report is an over-read performed by radiologist Dr. Toribio Aye of [facility] on 02/20/2018. This over-read does not include interpretation of cardiac or coronary anatomy or pathology. The coronary calcium score/coronary CTA interpretation by the cardiologist is attached.  COMPARISON:  None.  FINDINGS: Extracardiac findings will be described under separate dictation for contemporaneously obtained CTA chest, abdomen and pelvis.  IMPRESSION: Please see separate dictation for contemporaneously obtained CTA chest, abdomen and pelvis dated 02/20/2018 for full description of relevant extracardiac findings.  Electronically Signed: By: Toribio Aye M.D. On: 02/20/2018 15:14     ______________________________________________________________________________________________          Recent Labs: 08/10/2024: ALT 12; BUN 26;  Creatinine 1.55; Hemoglobin 12.9; Platelet Count 233; Potassium 4.4; Sodium 144  Recent Lipid Panel    Component Value Date/Time   CHOL 217 (H) 04/10/2021 1357   TRIG 180 (H) 04/10/2021 1357   HDL 78 04/10/2021 1357   CHOLHDL 2.8 04/10/2021 1357   LDLCALC 108 (H) 04/10/2021 1357    Physical Exam:    VS:  There were no vitals taken for this visit.    Wt Readings from Last 3 Encounters:  08/10/24 101 lb 11.2 oz (46.1 kg)  07/23/24 103 lb 9.6 oz (47 kg)  05/11/24 103 lb (46.7 kg)     GEN: *** Well nourished, well developed in no acute distress HEENT: Normal NECK: No JVD; No carotid bruits LYMPHATICS: No lymphadenopathy CARDIAC: ***RRR, no murmurs, rubs, gallops RESPIRATORY:  Clear to auscultation without rales, wheezing or rhonchi  ABDOMEN: Soft, non-tender, non-distended MUSCULOSKELETAL:  No edema; No deformity  SKIN: Warm and dry NEUROLOGIC:  Alert and oriented x 3 PSYCHIATRIC:  Normal affect    Signed, Redell Leiter, MD  09/17/2024 1:37 PM    [facility]     [1]  No outpatient medications have been marked as taking for the 09/18/24 encounter (Appointment) with Leiter Redell PARAS, MD.

## 2024-09-18 ENCOUNTER — Encounter: Payer: Self-pay | Admitting: Cardiology

## 2024-09-18 ENCOUNTER — Ambulatory Visit: Attending: Cardiology | Admitting: Cardiology

## 2024-09-18 VITALS — BP 100/42 | HR 68 | Ht 60.0 in | Wt 104.6 lb

## 2024-09-18 DIAGNOSIS — I442 Atrioventricular block, complete: Secondary | ICD-10-CM | POA: Insufficient documentation

## 2024-09-18 DIAGNOSIS — I25118 Atherosclerotic heart disease of native coronary artery with other forms of angina pectoris: Secondary | ICD-10-CM | POA: Insufficient documentation

## 2024-09-18 DIAGNOSIS — Z952 Presence of prosthetic heart valve: Secondary | ICD-10-CM | POA: Diagnosis not present

## 2024-09-18 DIAGNOSIS — I130 Hypertensive heart and chronic kidney disease with heart failure and stage 1 through stage 4 chronic kidney disease, or unspecified chronic kidney disease: Secondary | ICD-10-CM | POA: Insufficient documentation

## 2024-09-18 DIAGNOSIS — N183 Chronic kidney disease, stage 3 unspecified: Secondary | ICD-10-CM | POA: Diagnosis not present

## 2024-09-18 MED ORDER — NITROGLYCERIN 0.4 MG SL SUBL: 0.4000 mg | SUBLINGUAL_TABLET | SUBLINGUAL | 3 refills | Status: AC | PRN

## 2024-09-18 NOTE — Patient Instructions (Addendum)
 Medication Instructions:   Nitroglycerin : Use nitroglycerin  1 tablet placed under the tongue at the first sign of chest pain or an angina attack. 1 tablet may be used every 5 minutes as needed, for up to 15 minutes. Do not take more than 3 tablets in 15 minutes. If pain persist call 911 or go to the nearest ED.     Lab Work: None Ordered If you have labs (blood work) drawn today and your tests are completely normal, you will receive your results only by: MyChart Message (if you have MyChart) OR A paper copy in the mail If you have any lab test that is abnormal or we need to change your treatment, we will call you to review the results.   Testing/Procedures: None Ordered   Follow-Up: At [facility], you and your health needs are our priority.  As part of our continuing mission to provide you with exceptional heart care, we have created designated Provider Care Teams.  These Care Teams include your primary Cardiologist (physician) and Advanced Practice Providers (APPs -  Physician Assistants and Nurse Practitioners) who all work together to provide you with the care you need, when you need it.  We recommend signing up for the patient portal called MyChart.  Sign up information is provided on this After Visit Summary.  MyChart is used to connect with patients for Virtual Visits (Telemedicine).  Patients are able to view lab/test results, encounter notes, upcoming appointments, etc.  Non-urgent messages can be sent to your provider as well.   To learn more about what you can do with MyChart, go to [email or web address].    Your next appointment:   6 month(s)  The format for your next appointment:   In Person  Provider:   Redell Leiter, MD   Other Instructions NA

## 2024-10-09 ENCOUNTER — Inpatient Hospital Stay: Attending: Oncology

## 2024-10-09 ENCOUNTER — Other Ambulatory Visit: Payer: Self-pay

## 2024-10-09 DIAGNOSIS — D631 Anemia in chronic kidney disease: Secondary | ICD-10-CM

## 2024-10-09 LAB — CBC WITH DIFFERENTIAL (CANCER CENTER ONLY)
Abs Immature Granulocytes: 0.02 K/uL (ref 0.00–0.07)
Basophils Absolute: 0 K/uL (ref 0.0–0.1)
Basophils Relative: 0 %
Eosinophils Absolute: 0 K/uL (ref 0.0–0.5)
Eosinophils Relative: 1 %
HCT: 34 % — ABNORMAL LOW (ref 36.0–46.0)
Hemoglobin: 10.7 g/dL — ABNORMAL LOW (ref 12.0–15.0)
Immature Granulocytes: 0 %
Lymphocytes Relative: 16 %
Lymphs Abs: 1 K/uL (ref 0.7–4.0)
MCH: 31 pg (ref 26.0–34.0)
MCHC: 31.5 g/dL (ref 30.0–36.0)
MCV: 98.6 fL (ref 80.0–100.0)
Monocytes Absolute: 0.3 K/uL (ref 0.1–1.0)
Monocytes Relative: 4 %
Neutro Abs: 4.8 K/uL (ref 1.7–7.7)
Neutrophils Relative %: 79 %
Platelet Count: 180 K/uL (ref 150–400)
RBC: 3.45 MIL/uL — ABNORMAL LOW (ref 3.87–5.11)
RDW: 15.5 % (ref 11.5–15.5)
WBC Count: 6.1 K/uL (ref 4.0–10.5)
nRBC: 0 % (ref 0.0–0.2)

## 2024-10-13 ENCOUNTER — Telehealth: Payer: Self-pay | Admitting: Cardiology

## 2024-10-13 NOTE — Telephone Encounter (Signed)
 Pt c/o BP issue: STAT if pt c/o blurred vision, one-sided weakness or slurred speech.  STAT if BP is GREATER than 180/120 TODAY.  STAT if BP is LESS than 90/60 and SYMPTOMATIC TODAY  1. What is your BP concern? Hypertension   2. Have you taken any BP medication today? Yes, Hydralazine    3. What are your last 5 BP readings? 197/ & 179/, unable to remmeber the bottom number   4. Are you having any other symptoms (ex. Dizziness, headache, blurred vision, passed out)? Ocassional headaches

## 2024-10-14 ENCOUNTER — Other Ambulatory Visit: Payer: Self-pay

## 2024-10-14 NOTE — Telephone Encounter (Signed)
 Called the patient's daughter Boby and she reported that the patient has been having elevated blood pressures.The blood pressures over the past 3 days are listed below:  1/9 - 204/77 - before medication         165/62 - after medication  1/10 - 193/73 - before medication           176/73 - after medication 1/11 - 197/66 - before medication           169/53 - after medication  Patient also has been experiencing headaches when her blood pressure has been elevated. Please advise.

## 2024-10-20 ENCOUNTER — Other Ambulatory Visit: Payer: Self-pay

## 2024-10-20 MED ORDER — HYDRALAZINE HCL 25 MG PO TABS: 25.0000 mg | ORAL_TABLET | Freq: Three times a day (TID) | ORAL | 3 refills | Status: AC

## 2024-10-20 NOTE — Telephone Encounter (Signed)
 The patient's daughter stated that they recently saw a NP at [facility] and they increased the patients Metoprolol  in the morning to 75 mg and 50 mg in the evening. Can I make that change on the patient's medication list?

## 2024-10-20 NOTE — Telephone Encounter (Signed)
 Called Kaitlin Villarreal and informed her daughter Vickie regarding Dr. Levis recommendation regarding her elevated blood pressure below:  Dr. Leandrew Villarreal. Labile blood pressures reviewed by Dr. Monetta at his last office visit. Multiple medications for blood pressure including amlodipine , lisinopril , metoprolol , hydralazine .   Blood pressures at home are elevated. Please confirm with her Kaitlin doses of her blood pressure medications if they are accurate. I would recommend continuing hydralazine  25 mg 3 times daily and on Kaitlin days where Kaitlin systolic blood pressure is greater than 160 mmHg, take an additional 25 mg dose with Kaitlin dose scheduled for that time.  Kaitlin Villarreal's daughter Boby verbalized understanding and had no further questions at this time.

## 2024-10-21 ENCOUNTER — Other Ambulatory Visit: Payer: Self-pay

## 2024-10-21 MED ORDER — METOPROLOL TARTRATE 50 MG PO TABS: ORAL_TABLET | ORAL | 3 refills | Status: AC

## 2024-10-21 NOTE — Telephone Encounter (Signed)
 Updated the patient's medication list to show the correct dose of Metoprolol  tartrate. Patient is taking Metoprolol  tartrate 75 mg in the morning and 50 mg in the evening. Patient is aware and had no further questions at this time.

## 2024-12-08 ENCOUNTER — Inpatient Hospital Stay: Admitting: Oncology

## 2024-12-08 ENCOUNTER — Inpatient Hospital Stay

## 2025-04-09 ENCOUNTER — Inpatient Hospital Stay: Admitting: Oncology

## 2025-04-09 ENCOUNTER — Inpatient Hospital Stay
